# Patient Record
Sex: Male | Born: 1975 | Race: White | Hispanic: No | Marital: Married | Smoking: Never smoker
Health system: Southern US, Community
[De-identification: ages and names within clinical notes are randomized; demographics above are authoritative.]

## PROBLEM LIST (undated history)

## (undated) DIAGNOSIS — K219 Gastro-esophageal reflux disease without esophagitis: Secondary | ICD-10-CM

## (undated) DIAGNOSIS — R51 Headache: Secondary | ICD-10-CM

## (undated) DIAGNOSIS — M1990 Unspecified osteoarthritis, unspecified site: Secondary | ICD-10-CM

## (undated) HISTORY — PX: BACK SURGERY: SHX140

---

## 2007-11-16 ENCOUNTER — Encounter: Admission: RE | Admit: 2007-11-16 | Discharge: 2007-11-16 | Payer: Self-pay | Admitting: Orthopedic Surgery

## 2008-11-10 ENCOUNTER — Encounter: Admission: RE | Admit: 2008-11-10 | Discharge: 2008-11-10 | Payer: Self-pay | Admitting: Neurosurgery

## 2008-12-10 ENCOUNTER — Ambulatory Visit (HOSPITAL_COMMUNITY): Admission: RE | Admit: 2008-12-10 | Discharge: 2008-12-10 | Payer: Self-pay | Admitting: Neurosurgery

## 2009-03-13 ENCOUNTER — Encounter: Admission: RE | Admit: 2009-03-13 | Discharge: 2009-03-13 | Payer: Self-pay | Admitting: Neurosurgery

## 2010-03-25 ENCOUNTER — Encounter: Admission: RE | Admit: 2010-03-25 | Discharge: 2010-03-25 | Payer: Self-pay | Admitting: Neurosurgery

## 2010-04-28 ENCOUNTER — Inpatient Hospital Stay (HOSPITAL_COMMUNITY)
Admission: RE | Admit: 2010-04-28 | Discharge: 2010-04-30 | Payer: Self-pay | Source: Home / Self Care | Attending: Neurosurgery | Admitting: Neurosurgery

## 2010-06-05 ENCOUNTER — Encounter
Admission: RE | Admit: 2010-06-05 | Discharge: 2010-06-05 | Payer: Self-pay | Source: Home / Self Care | Attending: Neurosurgery | Admitting: Neurosurgery

## 2010-08-05 LAB — DIFFERENTIAL
Basophils Absolute: 0 10*3/uL (ref 0.0–0.1)
Basophils Relative: 0 % (ref 0–1)
Eosinophils Absolute: 0.3 10*3/uL (ref 0.0–0.7)
Eosinophils Relative: 4 % (ref 0–5)
Lymphocytes Relative: 24 % (ref 12–46)
Lymphs Abs: 1.9 10*3/uL (ref 0.7–4.0)
Monocytes Absolute: 0.4 10*3/uL (ref 0.1–1.0)
Monocytes Relative: 5 % (ref 3–12)
Neutro Abs: 5.2 10*3/uL (ref 1.7–7.7)
Neutrophils Relative %: 67 % (ref 43–77)

## 2010-08-05 LAB — CBC
HCT: 45.7 % (ref 39.0–52.0)
Hemoglobin: 15.8 g/dL (ref 13.0–17.0)
MCH: 30.7 pg (ref 26.0–34.0)
MCHC: 34.6 g/dL (ref 30.0–36.0)
MCV: 88.9 fL (ref 78.0–100.0)
Platelets: 194 10*3/uL (ref 150–400)
RBC: 5.14 MIL/uL (ref 4.22–5.81)
RDW: 12.4 % (ref 11.5–15.5)
WBC: 7.8 10*3/uL (ref 4.0–10.5)

## 2010-08-05 LAB — SURGICAL PCR SCREEN
MRSA, PCR: NEGATIVE
Staphylococcus aureus: POSITIVE — AB

## 2010-08-05 LAB — TYPE AND SCREEN
ABO/RH(D): O POS
Antibody Screen: NEGATIVE

## 2010-08-31 LAB — CBC
HCT: 44.3 % (ref 39.0–52.0)
Hemoglobin: 15.7 g/dL (ref 13.0–17.0)
MCHC: 35.4 g/dL (ref 30.0–36.0)
MCV: 87.8 fL (ref 78.0–100.0)
Platelets: 192 10*3/uL (ref 150–400)
RBC: 5.04 MIL/uL (ref 4.22–5.81)
RDW: 12.5 % (ref 11.5–15.5)
WBC: 7.9 10*3/uL (ref 4.0–10.5)

## 2010-08-31 LAB — DIFFERENTIAL
Basophils Absolute: 0 10*3/uL (ref 0.0–0.1)
Basophils Relative: 0 % (ref 0–1)
Eosinophils Absolute: 0.2 10*3/uL (ref 0.0–0.7)
Eosinophils Relative: 3 % (ref 0–5)
Lymphocytes Relative: 23 % (ref 12–46)
Lymphs Abs: 1.8 10*3/uL (ref 0.7–4.0)
Monocytes Absolute: 0.3 10*3/uL (ref 0.1–1.0)
Monocytes Relative: 4 % (ref 3–12)
Neutro Abs: 5.5 10*3/uL (ref 1.7–7.7)
Neutrophils Relative %: 70 % (ref 43–77)

## 2010-08-31 LAB — ABO/RH: ABO/RH(D): O POS

## 2010-08-31 LAB — TYPE AND SCREEN
ABO/RH(D): O POS
Antibody Screen: NEGATIVE

## 2010-10-07 NOTE — Op Note (Signed)
NAME:  Samuel [name], Samuel [name]                ACCOUNT NO.:  [id number]   MEDICAL RECORD NO.:  [id number]          PATIENT TYPE:  OIB   LOCATION:  3535                         FACILITY:  [facility]   PHYSICIAN:  Kathaleen Maser. Pool, M.D.    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  Left L4-5  herniated nucleus pulposus with radiculopathy.   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  Left L4-5  herniated nucleus pulposus with radiculopathy.   PROCEDURE NAME:  Left L3-4 decompressive laminotomy and foraminotomy.  Left L4-5 laminotomy and microdiskectomy.   SURGEON:  Kathaleen Maser. Pool, MD   ASSISTANT:  Reinaldo Meeker, MD   ANESTHESIA:  General endotracheal.   INDICATIONS:  Mr. Lea is a 35 year old male with history of back pain  with left lower extremity symptoms consistent with mixed lumbar  radiculopathy.  Workup demonstrates evidence of left-sided L3-4 stenosis  with compression of the left-sided L4 nerve root and left-sided L4-5  paracentral disk herniation with stenosis and compression of the L5  nerve root.  The patient presents now for two-level decompression in  hopes improving his symptoms.   OPERATIVE NOTE:  The patient was brought to the operating room, and  placed on operating table in supine position.  After adequate level of  anesthesia was achieved, the patient was positioned prone onto Wilson  frame and appropriately padded.  The lumbar regions were prepped and  sterilely.  A 10-blade was used to make a linear skin incision overlying  the L3, L4, L5 levels.  This was carried down sharply in the midline.  Subperiosteal dissection was then performed on the right side exposing  the lamina and facet joints at L3, L4, and L5 on the left side.  Deep  self-retaining retractor was placed.  Intraoperative x-rays taken  and  the level was confirmed.  Laminotomy was then performed using high-speed  drill and Kerrison rongeurs to remove the inferior aspect of the lamina  of L3, medial aspect of L3-4 facet joint and the superior rim of the L4  lamina.  Ligament flavum was then elevated and resected in a piecemeal  fashion using Kerrison rongeurs.  Underlying thecal sac and exiting L4  nerve root were identified.  A laminotomy at L4-5 was also performed in  a similar fashion.  Epidural venous plexus coagulated and cut.  Microscope was then brought into the field using microdissection.  Starting first at L3-4, L4 nerve root and thecal sac were mobilized.  The disk space itself was recently flat without evidence of disk  herniation or annular defect.  The wound was then irrigated with  antibiotic solution.  Attention was then placed to the L4-5 level.  Thecal sac and L5 nerve root were gently mobilized and tracked towards  the midline.  Disk herniation was readily apparent.  This was then  incised with 15-blade in a rectangular fashion.  A wide disk space clean-  out was then  achieved using pituitary rongeurs, upbitting pituitary  rongeurs, and Epstein curettes.  All elements of the disk herniation  completely resected.  All loose or obviously degenerative disk material  was removed from the interspace.  At this point, a very thorough  decompression had been achieved.  There was no injury to thecal sac or  nerve roots.  The wound was then irrigated with antibiotic solution.  Gelfoam was placed topically for hemostasis, found to be good.  Wound  was then  inspected for hemostasis and found to be good and closed in typical  fashion.  Steri-Strips and sterile dressings were applied.  There were  no apparent complications.  The patient tolerated the procedure well,  and he returns to the recovery room postoperatively.           ______________________________  Kathaleen Maser Pool, M.D.     HAP/MEDQ  D:   12/10/2008  T:  12/11/2008  Job:  [id number]

## 2010-10-15 ENCOUNTER — Other Ambulatory Visit: Payer: Self-pay | Admitting: Neurosurgery

## 2010-10-15 DIAGNOSIS — M545 Low back pain, unspecified: Secondary | ICD-10-CM

## 2010-10-24 ENCOUNTER — Ambulatory Visit
Admission: RE | Admit: 2010-10-24 | Discharge: 2010-10-24 | Disposition: A | Discharge status: Home / Self Care | Payer: Federal, State, Local not specified - PPO | Source: Ambulatory Visit | Attending: Neurosurgery | Admitting: Neurosurgery

## 2010-10-24 DIAGNOSIS — M545 Low back pain, unspecified: Secondary | ICD-10-CM

## 2013-03-16 ENCOUNTER — Other Ambulatory Visit: Payer: Self-pay | Admitting: Neurosurgery

## 2013-03-16 DIAGNOSIS — M549 Dorsalgia, unspecified: Secondary | ICD-10-CM

## 2013-03-26 ENCOUNTER — Ambulatory Visit
Admission: RE | Admit: 2013-03-26 | Discharge: 2013-03-26 | Disposition: A | Discharge status: Home / Self Care | Payer: Federal, State, Local not specified - PPO | Source: Ambulatory Visit | Attending: Neurosurgery | Admitting: Neurosurgery

## 2013-03-26 DIAGNOSIS — M549 Dorsalgia, unspecified: Secondary | ICD-10-CM

## 2013-03-26 MED ORDER — GADOBENATE DIMEGLUMINE 529 MG/ML IV SOLN
20.0000 mL | Freq: Once | INTRAVENOUS | Status: AC | PRN
  Administered 2013-03-26: 20 mL via INTRAVENOUS

## 2013-05-10 ENCOUNTER — Other Ambulatory Visit: Payer: Self-pay | Admitting: Neurosurgery

## 2013-05-29 ENCOUNTER — Encounter (HOSPITAL_COMMUNITY): Payer: Self-pay

## 2013-06-01 NOTE — Pre-Procedure Instructions (Signed)
BAYLIN GAMBLIN  06/01/2013   Your procedure is scheduled on: Monday, Jan. 19th   Report to [facility]  2 * 3 at  5:30 AM.  Call this number if you have problems the morning of surgery: [phone]   Remember:   Do not eat food or drink liquids after midnight Sunday.   Take these medicines the morning of surgery with A SIP OF WATER: Oxycodone   Do not wear jewelry.  Do not wear lotions, powders, or colognes. You may NOT wear deodorant.   Men may shave face and neck.   Do not bring valuables to the hospital.  [facility] is not responsible for any belongings or valuables.               Contacts, dentures or bridgework may not be worn into surgery.  Leave suitcase in the car. After surgery it may be brought to your room.  For patients admitted to the hospital, discharge time is determined by your treatment team.             Name and phone number of your driver:  Kanye Depree    Wife    [phone]   Special Instructions: Shower using CHG 2 nights before surgery and the night before surgery.  If you shower the day of surgery use CHG.  Use special wash - you have one bottle of CHG for all showers.  You should use approximately 1/3 of the bottle for each shower.   Please read over the following fact sheets that you were given: Pain Booklet, Blood Transfusion Information, MRSA Information and Surgical Site Infection Prevention

## 2013-06-02 ENCOUNTER — Encounter (HOSPITAL_COMMUNITY): Payer: Self-pay

## 2013-06-02 ENCOUNTER — Encounter (HOSPITAL_COMMUNITY)
Admission: RE | Admit: 2013-06-02 | Discharge: 2013-06-02 | Disposition: A | Discharge status: Home / Self Care | Payer: Federal, State, Local not specified - PPO | Source: Ambulatory Visit | Attending: Neurosurgery | Admitting: Neurosurgery

## 2013-06-02 DIAGNOSIS — Z01812 Encounter for preprocedural laboratory examination: Secondary | ICD-10-CM | POA: Insufficient documentation

## 2013-06-02 DIAGNOSIS — Z01810 Encounter for preprocedural cardiovascular examination: Secondary | ICD-10-CM | POA: Insufficient documentation

## 2013-06-02 DIAGNOSIS — Z01818 Encounter for other preprocedural examination: Secondary | ICD-10-CM | POA: Insufficient documentation

## 2013-06-02 HISTORY — DX: Unspecified osteoarthritis, unspecified site: M19.90

## 2013-06-02 HISTORY — DX: Headache: R51

## 2013-06-02 HISTORY — DX: Gastro-esophageal reflux disease without esophagitis: K21.9

## 2013-06-02 LAB — CBC
HCT: 42.5 % (ref 39.0–52.0)
Hemoglobin: 14.8 g/dL (ref 13.0–17.0)
MCH: 31.2 pg (ref 26.0–34.0)
MCHC: 34.8 g/dL (ref 30.0–36.0)
MCV: 89.5 fL (ref 78.0–100.0)
Platelets: 178 10*3/uL (ref 150–400)
RBC: 4.75 MIL/uL (ref 4.22–5.81)
RDW: 12.6 % (ref 11.5–15.5)
WBC: 7.9 10*3/uL (ref 4.0–10.5)

## 2013-06-02 LAB — SURGICAL PCR SCREEN
MRSA, PCR: NEGATIVE
Staphylococcus aureus: POSITIVE — AB

## 2013-06-02 LAB — COMPREHENSIVE METABOLIC PANEL
ALT: 47 U/L (ref 0–53)
AST: 36 U/L (ref 0–37)
Albumin: 3.5 g/dL (ref 3.5–5.2)
Alkaline Phosphatase: 78 U/L (ref 39–117)
BUN: 6 mg/dL (ref 6–23)
CO2: 22 mEq/L (ref 19–32)
Calcium: 8.8 mg/dL (ref 8.4–10.5)
Chloride: 102 mEq/L (ref 96–112)
Creatinine, Ser: 0.9 mg/dL (ref 0.50–1.35)
GFR calc Af Amer: >90 mL/min (ref >90)
GFR calc non Af Amer: >90 mL/min (ref >90)
Glucose, Bld: 214 mg/dL — ABNORMAL HIGH (ref 70–99)
Potassium: 4.5 mEq/L (ref 3.7–5.3)
Sodium: 137 mEq/L (ref 137–147)
Total Bilirubin: 0.4 mg/dL (ref 0.3–1.2)
Total Protein: 7.3 g/dL (ref 6.0–8.3)

## 2013-06-02 LAB — TYPE AND SCREEN
ABO/RH(D): O POS
Antibody Screen: NEGATIVE

## 2013-06-02 NOTE — Progress Notes (Signed)
Patient has no PCP.  Went and had ekg about 12 yr ago because his mother insisted after his father had heart attack.  Hasn't seen anyone since, but does go to [place] on [address].   DA

## 2013-06-11 MED ORDER — DEXTROSE 5 % IV SOLN
3.0000 g | INTRAVENOUS | Status: AC
  Administered 2013-06-12 (×2): 3 g via INTRAVENOUS
  Filled 2013-06-11: qty 3000

## 2013-06-12 ENCOUNTER — Inpatient Hospital Stay (HOSPITAL_COMMUNITY)
Admission: RE | Admit: 2013-06-12 | Discharge: 2013-06-15 | DRG: 460 | Disposition: A | Discharge status: Home / Self Care | Payer: Federal, State, Local not specified - PPO | Source: Ambulatory Visit | Attending: Neurosurgery | Admitting: Neurosurgery

## 2013-06-12 ENCOUNTER — Inpatient Hospital Stay (HOSPITAL_COMMUNITY): Payer: Federal, State, Local not specified - PPO | Admitting: Certified Registered Nurse Anesthetist

## 2013-06-12 ENCOUNTER — Inpatient Hospital Stay (HOSPITAL_COMMUNITY): Payer: Federal, State, Local not specified - PPO

## 2013-06-12 ENCOUNTER — Encounter (HOSPITAL_COMMUNITY)
Admission: RE | Disposition: A | Discharge status: Home / Self Care | Payer: Medicare Other | Source: Ambulatory Visit | Attending: Neurosurgery

## 2013-06-12 ENCOUNTER — Encounter (HOSPITAL_COMMUNITY): Payer: Federal, State, Local not specified - PPO | Admitting: Certified Registered Nurse Anesthetist

## 2013-06-12 ENCOUNTER — Encounter (HOSPITAL_COMMUNITY): Payer: Self-pay | Admitting: *Deleted

## 2013-06-12 DIAGNOSIS — M5136 Other intervertebral disc degeneration, lumbar region: Secondary | ICD-10-CM | POA: Diagnosis present

## 2013-06-12 DIAGNOSIS — M545 Low back pain, unspecified: Secondary | ICD-10-CM | POA: Diagnosis present

## 2013-06-12 DIAGNOSIS — K219 Gastro-esophageal reflux disease without esophagitis: Secondary | ICD-10-CM | POA: Diagnosis present

## 2013-06-12 DIAGNOSIS — Z79899 Other long term (current) drug therapy: Secondary | ICD-10-CM | POA: Diagnosis not present

## 2013-06-12 DIAGNOSIS — M51369 Other intervertebral disc degeneration, lumbar region without mention of lumbar back pain or lower extremity pain: Secondary | ICD-10-CM | POA: Diagnosis present

## 2013-06-12 DIAGNOSIS — G8929 Other chronic pain: Secondary | ICD-10-CM | POA: Diagnosis present

## 2013-06-12 DIAGNOSIS — M51379 Other intervertebral disc degeneration, lumbosacral region without mention of lumbar back pain or lower extremity pain: Principal | ICD-10-CM | POA: Diagnosis present

## 2013-06-12 DIAGNOSIS — M5137 Other intervertebral disc degeneration, lumbosacral region: Principal | ICD-10-CM | POA: Diagnosis present

## 2013-06-12 DIAGNOSIS — R509 Fever, unspecified: Secondary | ICD-10-CM | POA: Diagnosis not present

## 2013-06-12 SURGERY — POSTERIOR LUMBAR FUSION 2 LEVEL
Anesthesia: General | Site: Back

## 2013-06-12 MED ORDER — ALUM & MAG HYDROXIDE-SIMETH 200-200-20 MG/5ML PO SUSP: 30.0000 mL | Freq: Four times a day (QID) | ORAL | Status: DC | PRN

## 2013-06-12 MED ORDER — PHENOL 1.4 % MT LIQD: 1.0000 | OROMUCOSAL | Status: DC | PRN

## 2013-06-12 MED ORDER — LIDOCAINE HCL (CARDIAC) 20 MG/ML IV SOLN
INTRAVENOUS | Status: DC | PRN
  Administered 2013-06-12: 40 mg via INTRAVENOUS

## 2013-06-12 MED ORDER — MIDAZOLAM HCL 5 MG/5ML IJ SOLN
INTRAMUSCULAR | Status: DC | PRN
  Administered 2013-06-12: 2 mg via INTRAVENOUS

## 2013-06-12 MED ORDER — ALBUMIN HUMAN 5 % IV SOLN
INTRAVENOUS | Status: DC | PRN
  Administered 2013-06-12: 11:00:00 via INTRAVENOUS

## 2013-06-12 MED ORDER — ACETAMINOPHEN 650 MG RE SUPP: 650.0000 mg | RECTAL | Status: DC | PRN

## 2013-06-12 MED ORDER — FENTANYL CITRATE 0.05 MG/ML IJ SOLN
INTRAMUSCULAR | Status: DC | PRN
  Administered 2013-06-12 (×2): 50 ug via INTRAVENOUS
  Administered 2013-06-12: 250 ug via INTRAVENOUS
  Administered 2013-06-12 (×3): 50 ug via INTRAVENOUS

## 2013-06-12 MED ORDER — ROCURONIUM BROMIDE 100 MG/10ML IV SOLN
INTRAVENOUS | Status: DC | PRN
  Administered 2013-06-12: 50 mg via INTRAVENOUS

## 2013-06-12 MED ORDER — THROMBIN 5000 UNITS EX SOLR
CUTANEOUS | Status: DC | PRN
  Administered 2013-06-12 (×2): 5000 [IU] via TOPICAL

## 2013-06-12 MED ORDER — THROMBIN 20000 UNITS EX SOLR
CUTANEOUS | Status: DC | PRN
  Administered 2013-06-12: 09:00:00 via TOPICAL

## 2013-06-12 MED ORDER — OXYCODONE-ACETAMINOPHEN 5-325 MG PO TABS
1.0000 | ORAL_TABLET | ORAL | Status: DC | PRN
  Administered 2013-06-12 – 2013-06-15 (×10): 2 via ORAL
  Filled 2013-06-12 (×2): qty 2
  Filled 2013-06-12: qty 1
  Filled 2013-06-12 (×9): qty 2

## 2013-06-12 MED ORDER — MEPERIDINE HCL 25 MG/ML IJ SOLN: 6.2500 mg | INTRAMUSCULAR | Status: DC | PRN

## 2013-06-12 MED ORDER — HYDROMORPHONE HCL PF 1 MG/ML IJ SOLN
INTRAMUSCULAR | Status: DC | PRN
  Administered 2013-06-12 (×2): 0.5 mg via INTRAVENOUS

## 2013-06-12 MED ORDER — MIDAZOLAM HCL 2 MG/2ML IJ SOLN
INTRAMUSCULAR | Status: AC
  Filled 2013-06-12: qty 2

## 2013-06-12 MED ORDER — KETOROLAC TROMETHAMINE 30 MG/ML IJ SOLN
30.0000 mg | Freq: Once | INTRAMUSCULAR | Status: AC
  Administered 2013-06-12: 30 mg via INTRAVENOUS

## 2013-06-12 MED ORDER — NALOXONE HCL 0.4 MG/ML IJ SOLN: 0.4000 mg | INTRAMUSCULAR | Status: DC | PRN

## 2013-06-12 MED ORDER — SODIUM CHLORIDE 0.9 % IR SOLN
Status: DC | PRN
  Administered 2013-06-12: 09:00:00

## 2013-06-12 MED ORDER — GLYCOPYRROLATE 0.2 MG/ML IJ SOLN
INTRAMUSCULAR | Status: DC | PRN
  Administered 2013-06-12: 0.6 mg via INTRAVENOUS

## 2013-06-12 MED ORDER — ONDANSETRON HCL 4 MG/2ML IJ SOLN: 4.0000 mg | Freq: Four times a day (QID) | INTRAMUSCULAR | Status: DC | PRN

## 2013-06-12 MED ORDER — DIAZEPAM 5 MG PO TABS
5.0000 mg | ORAL_TABLET | Freq: Four times a day (QID) | ORAL | Status: DC | PRN
Start: 2013-06-12 — End: 2013-06-15
  Administered 2013-06-12 – 2013-06-15 (×10): 5 mg via ORAL
  Filled 2013-06-12 (×9): qty 1

## 2013-06-12 MED ORDER — OXYCODONE-ACETAMINOPHEN 10-325 MG PO TABS: 1.0000 | ORAL_TABLET | Freq: Four times a day (QID) | ORAL | Status: DC | PRN

## 2013-06-12 MED ORDER — HEMOSTATIC AGENTS (NO CHARGE) OPTIME
TOPICAL | Status: DC | PRN
  Administered 2013-06-12: 1 via TOPICAL

## 2013-06-12 MED ORDER — KETOROLAC TROMETHAMINE 30 MG/ML IJ SOLN
INTRAMUSCULAR | Status: AC
  Administered 2013-06-12: 15:00:00
  Filled 2013-06-12: qty 1

## 2013-06-12 MED ORDER — DIAZEPAM 5 MG PO TABS
ORAL_TABLET | ORAL | Status: AC
  Filled 2013-06-12: qty 1

## 2013-06-12 MED ORDER — ONDANSETRON HCL 4 MG/2ML IJ SOLN: 4.0000 mg | INTRAMUSCULAR | Status: DC | PRN

## 2013-06-12 MED ORDER — OXYCODONE-ACETAMINOPHEN 5-325 MG PO TABS
1.0000 | ORAL_TABLET | Freq: Four times a day (QID) | ORAL | Status: DC | PRN
  Filled 2013-06-12: qty 1

## 2013-06-12 MED ORDER — MORPHINE SULFATE 15 MG PO TABS: 30.0000 mg | ORAL_TABLET | Freq: Two times a day (BID) | ORAL | Status: DC

## 2013-06-12 MED ORDER — BUPIVACAINE-EPINEPHRINE PF 0.5-1:200000 % IJ SOLN
INTRAMUSCULAR | Status: DC | PRN
  Administered 2013-06-12: 10 mL via PERINEURAL

## 2013-06-12 MED ORDER — MORPHINE SULFATE (PF) 1 MG/ML IV SOLN
INTRAVENOUS | Status: AC
  Filled 2013-06-12: qty 25

## 2013-06-12 MED ORDER — PROMETHAZINE HCL 25 MG/ML IJ SOLN: 6.2500 mg | INTRAMUSCULAR | Status: DC | PRN

## 2013-06-12 MED ORDER — BUPIVACAINE LIPOSOME 1.3 % IJ SUSP
20.0000 mL | INTRAMUSCULAR | Status: AC
  Filled 2013-06-12: qty 20

## 2013-06-12 MED ORDER — LACTATED RINGERS IV SOLN
INTRAVENOUS | Status: DC
  Administered 2013-06-12 – 2013-06-13 (×2): via INTRAVENOUS

## 2013-06-12 MED ORDER — OXYCODONE HCL 5 MG PO TABS
ORAL_TABLET | ORAL | Status: AC
  Filled 2013-06-12: qty 1

## 2013-06-12 MED ORDER — VECURONIUM BROMIDE 10 MG IV SOLR
INTRAVENOUS | Status: DC | PRN
  Administered 2013-06-12 (×6): 2 mg via INTRAVENOUS
  Administered 2013-06-12: 3 mg via INTRAVENOUS
  Administered 2013-06-12: 2 mg via INTRAVENOUS

## 2013-06-12 MED ORDER — MIDAZOLAM HCL 2 MG/2ML IJ SOLN
0.5000 mg | Freq: Once | INTRAMUSCULAR | Status: AC | PRN
  Administered 2013-06-12: 2 mg via INTRAVENOUS

## 2013-06-12 MED ORDER — SODIUM CHLORIDE 0.9 % IJ SOLN: 9.0000 mL | INTRAMUSCULAR | Status: DC | PRN

## 2013-06-12 MED ORDER — BACITRACIN ZINC 500 UNIT/GM EX OINT
TOPICAL_OINTMENT | CUTANEOUS | Status: DC | PRN
  Administered 2013-06-12: 1 via TOPICAL

## 2013-06-12 MED ORDER — 0.9 % SODIUM CHLORIDE (POUR BTL) OPTIME
TOPICAL | Status: DC | PRN
  Administered 2013-06-12 (×2): 1000 mL

## 2013-06-12 MED ORDER — DIPHENHYDRAMINE HCL 50 MG/ML IJ SOLN: 12.5000 mg | Freq: Four times a day (QID) | INTRAMUSCULAR | Status: DC | PRN

## 2013-06-12 MED ORDER — GABAPENTIN 300 MG PO CAPS
600.0000 mg | ORAL_CAPSULE | Freq: Every day | ORAL | Status: DC
  Administered 2013-06-12 – 2013-06-14 (×3): 600 mg via ORAL
  Filled 2013-06-12 (×5): qty 2

## 2013-06-12 MED ORDER — HYDROCODONE-ACETAMINOPHEN 5-325 MG PO TABS
1.0000 | ORAL_TABLET | ORAL | Status: DC | PRN
  Administered 2013-06-14: 2 via ORAL
  Filled 2013-06-12: qty 2

## 2013-06-12 MED ORDER — OXYCODONE HCL 5 MG PO TABS
5.0000 mg | ORAL_TABLET | Freq: Four times a day (QID) | ORAL | Status: DC | PRN
  Administered 2013-06-14: 5 mg via ORAL
  Filled 2013-06-12 (×3): qty 1

## 2013-06-12 MED ORDER — LACTATED RINGERS IV SOLN
INTRAVENOUS | Status: DC | PRN
  Administered 2013-06-12 (×4): via INTRAVENOUS

## 2013-06-12 MED ORDER — CEFAZOLIN SODIUM-DEXTROSE 2-3 GM-% IV SOLR
INTRAVENOUS | Status: AC
  Filled 2013-06-12: qty 50

## 2013-06-12 MED ORDER — PROPOFOL 10 MG/ML IV BOLUS
INTRAVENOUS | Status: DC | PRN
  Administered 2013-06-12: 200 mg via INTRAVENOUS

## 2013-06-12 MED ORDER — ONDANSETRON HCL 4 MG/2ML IJ SOLN
INTRAMUSCULAR | Status: DC | PRN
  Administered 2013-06-12: 4 mg via INTRAVENOUS

## 2013-06-12 MED ORDER — DOCUSATE SODIUM 100 MG PO CAPS
100.0000 mg | ORAL_CAPSULE | Freq: Two times a day (BID) | ORAL | Status: DC
  Administered 2013-06-12 – 2013-06-15 (×6): 100 mg via ORAL
  Filled 2013-06-12 (×5): qty 1

## 2013-06-12 MED ORDER — MORPHINE SULFATE (PF) 1 MG/ML IV SOLN
INTRAVENOUS | Status: DC
  Administered 2013-06-12: 16:00:00 via INTRAVENOUS
  Administered 2013-06-12: 7.5 mg via INTRAVENOUS
  Administered 2013-06-12: 14:00:00 via INTRAVENOUS
  Administered 2013-06-13: 25.5 mg via INTRAVENOUS
  Administered 2013-06-13: 20.88 mg via INTRAVENOUS
  Administered 2013-06-13: 6 mg via INTRAVENOUS
  Administered 2013-06-13: 9.9 mg via INTRAVENOUS
  Administered 2013-06-13: 16:00:00 via INTRAVENOUS
  Administered 2013-06-13: 20.68 mg via INTRAVENOUS
  Administered 2013-06-13: 25.47 mg via INTRAVENOUS
  Administered 2013-06-13: 20 mg via INTRAVENOUS
  Administered 2013-06-13 (×2): via INTRAVENOUS
  Administered 2013-06-14: 13.03 mg via INTRAVENOUS
  Administered 2013-06-14: 02:00:00 via INTRAVENOUS
  Administered 2013-06-14: 20.99 mg via INTRAVENOUS
  Filled 2013-06-12 (×8): qty 25

## 2013-06-12 MED ORDER — CEFAZOLIN SODIUM 1-5 GM-% IV SOLN
INTRAVENOUS | Status: AC
  Filled 2013-06-12: qty 50

## 2013-06-12 MED ORDER — CEFAZOLIN SODIUM-DEXTROSE 2-3 GM-% IV SOLR
2.0000 g | Freq: Three times a day (TID) | INTRAVENOUS | Status: AC
  Administered 2013-06-12 – 2013-06-13 (×2): 2 g via INTRAVENOUS
  Filled 2013-06-12 (×2): qty 50

## 2013-06-12 MED ORDER — HYDROMORPHONE HCL PF 1 MG/ML IJ SOLN
0.2500 mg | INTRAMUSCULAR | Status: DC | PRN
  Administered 2013-06-12 (×4): 0.5 mg via INTRAVENOUS

## 2013-06-12 MED ORDER — OXYCODONE HCL 5 MG PO TABS
5.0000 mg | ORAL_TABLET | Freq: Once | ORAL | Status: AC | PRN
  Administered 2013-06-12: 5 mg via ORAL

## 2013-06-12 MED ORDER — BUPIVACAINE LIPOSOME 1.3 % IJ SUSP
INTRAMUSCULAR | Status: DC | PRN
  Administered 2013-06-12: 20 mL

## 2013-06-12 MED ORDER — MENTHOL 3 MG MT LOZG: 1.0000 | LOZENGE | OROMUCOSAL | Status: DC | PRN

## 2013-06-12 MED ORDER — MORPHINE SULFATE 15 MG PO TABS: 60.0000 mg | ORAL_TABLET | Freq: Every day | ORAL | Status: DC

## 2013-06-12 MED ORDER — HYDROMORPHONE HCL PF 1 MG/ML IJ SOLN
INTRAMUSCULAR | Status: AC
  Filled 2013-06-12: qty 1

## 2013-06-12 MED ORDER — ARTIFICIAL TEARS OP OINT
TOPICAL_OINTMENT | OPHTHALMIC | Status: DC | PRN
  Administered 2013-06-12: 1 via OPHTHALMIC

## 2013-06-12 MED ORDER — NEOSTIGMINE METHYLSULFATE 1 MG/ML IJ SOLN
INTRAMUSCULAR | Status: DC | PRN
  Administered 2013-06-12: 4 mg via INTRAVENOUS

## 2013-06-12 MED ORDER — OXYCODONE HCL 5 MG/5ML PO SOLN: 5.0000 mg | Freq: Once | ORAL | Status: AC | PRN

## 2013-06-12 MED ORDER — MORPHINE SULFATE 2 MG/ML IJ SOLN
1.0000 mg | INTRAMUSCULAR | Status: DC | PRN
  Administered 2013-06-12 – 2013-06-13 (×7): 4 mg via INTRAVENOUS
  Administered 2013-06-14 (×2): 2 mg via INTRAVENOUS
  Administered 2013-06-14: 4 mg via INTRAVENOUS
  Administered 2013-06-14 (×3): 2 mg via INTRAVENOUS
  Filled 2013-06-12 (×2): qty 1
  Filled 2013-06-12 (×3): qty 2
  Filled 2013-06-12 (×2): qty 1
  Filled 2013-06-12 (×3): qty 2
  Filled 2013-06-12: qty 1
  Filled 2013-06-12 (×2): qty 2

## 2013-06-12 MED ORDER — DIPHENHYDRAMINE HCL 12.5 MG/5ML PO ELIX: 12.5000 mg | ORAL_SOLUTION | Freq: Four times a day (QID) | ORAL | Status: DC | PRN

## 2013-06-12 MED ORDER — MORPHINE SULFATE 15 MG PO TABS
30.0000 mg | ORAL_TABLET | Freq: Every day | ORAL | Status: DC
  Administered 2013-06-12: 30 mg via ORAL
  Filled 2013-06-12: qty 2

## 2013-06-12 MED ORDER — ACETAMINOPHEN 325 MG PO TABS: 650.0000 mg | ORAL_TABLET | ORAL | Status: DC | PRN

## 2013-06-12 SURGICAL SUPPLY — 77 items
APL SKNCLS STERI-STRIP NONHPOA (GAUZE/BANDAGES/DRESSINGS) ×1
BAG DECANTER FOR FLEXI CONT (MISCELLANEOUS) ×3 IMPLANT
BENZOIN TINCTURE PRP APPL 2/3 (GAUZE/BANDAGES/DRESSINGS) ×3 IMPLANT
BLADE SURG ROTATE 9660 (MISCELLANEOUS) IMPLANT
BRUSH SCRUB EZ PLAIN DRY (MISCELLANEOUS) ×3 IMPLANT
BUR ACORN 6.0 (BURR) ×2 IMPLANT
BUR ACORN 6.0MM (BURR) ×1
BUR MATCHSTICK NEURO 3.0 LAGG (BURR) ×3 IMPLANT
CANISTER SUCT 3000ML (MISCELLANEOUS) ×3 IMPLANT
CAP LCK SPNE (Orthopedic Implant) ×8 IMPLANT
CAP LOCK SPINE RADIUS (Orthopedic Implant) IMPLANT
CAP LOCKING (Orthopedic Implant) ×24 IMPLANT
CLOSURE WOUND 1/2 X4 (GAUZE/BANDAGES/DRESSINGS) ×1
CONT SPEC 4OZ CLIKSEAL STRL BL (MISCELLANEOUS) ×3 IMPLANT
COVER BACK TABLE 24X17X13 BIG (DRAPES) IMPLANT
COVER TABLE BACK 60X90 (DRAPES) ×3 IMPLANT
DRAPE C-ARM 42X72 X-RAY (DRAPES) ×6 IMPLANT
DRAPE LAPAROTOMY 100X72X124 (DRAPES) ×3 IMPLANT
DRAPE POUCH INSTRU U-SHP 10X18 (DRAPES) ×3 IMPLANT
DRAPE PROXIMA HALF (DRAPES) ×3 IMPLANT
DRAPE SURG 17X23 STRL (DRAPES) ×12 IMPLANT
ELECT BLADE 4.0 EZ CLEAN MEGAD (MISCELLANEOUS) ×3
ELECT REM PT RETURN 9FT ADLT (ELECTROSURGICAL) ×3
ELECTRODE BLDE 4.0 EZ CLN MEGD (MISCELLANEOUS) ×1 IMPLANT
ELECTRODE REM PT RTRN 9FT ADLT (ELECTROSURGICAL) ×1 IMPLANT
GAUZE SPONGE 4X4 16PLY XRAY LF (GAUZE/BANDAGES/DRESSINGS) ×5 IMPLANT
GLOVE BIO SURGEON STRL SZ8.5 (GLOVE) ×6 IMPLANT
GLOVE BIOGEL PI IND STRL 7.5 (GLOVE) IMPLANT
GLOVE BIOGEL PI INDICATOR 7.5 (GLOVE) ×6
GLOVE EXAM NITRILE LRG STRL (GLOVE) IMPLANT
GLOVE EXAM NITRILE MD LF STRL (GLOVE) IMPLANT
GLOVE EXAM NITRILE XL STR (GLOVE) IMPLANT
GLOVE EXAM NITRILE XS STR PU (GLOVE) IMPLANT
GLOVE SS BIOGEL STRL SZ 8 (GLOVE) ×2 IMPLANT
GLOVE SUPERSENSE BIOGEL SZ 8 (GLOVE) ×4
GLOVE SURG SS PI 7.0 STRL IVOR (GLOVE) ×6 IMPLANT
GOWN BRE IMP SLV AUR LG STRL (GOWN DISPOSABLE) IMPLANT
GOWN BRE IMP SLV AUR XL STRL (GOWN DISPOSABLE) ×2 IMPLANT
GOWN STRL REIN 2XL LVL4 (GOWN DISPOSABLE) IMPLANT
GOWN STRL REUS W/ TWL XL LVL3 (GOWN DISPOSABLE) IMPLANT
GOWN STRL REUS W/TWL XL LVL3 (GOWN DISPOSABLE) ×12
KIT BASIN OR (CUSTOM PROCEDURE TRAY) ×3 IMPLANT
KIT ROOM TURNOVER OR (KITS) ×3 IMPLANT
NDL HYPO 21X1.5 SAFETY (NEEDLE) IMPLANT
NEEDLE HYPO 21X1.5 SAFETY (NEEDLE) ×3 IMPLANT
NEEDLE HYPO 22GX1.5 SAFETY (NEEDLE) ×3 IMPLANT
NS IRRIG 1000ML POUR BTL (IV SOLUTION) ×3 IMPLANT
PACK FOAM VITOSS 10CC (Orthopedic Implant) ×4 IMPLANT
PACK LAMINECTOMY NEURO (CUSTOM PROCEDURE TRAY) ×3 IMPLANT
PAD ARMBOARD 7.5X6 YLW CONV (MISCELLANEOUS) ×9 IMPLANT
PATTIES SURGICAL .5 X.5 (GAUZE/BANDAGES/DRESSINGS) ×2 IMPLANT
PATTIES SURGICAL .5 X1 (DISPOSABLE) IMPLANT
PATTIES SURGICAL 1X1 (DISPOSABLE) ×2 IMPLANT
PUTTY 10ML ACTIFUSE ABX (Putty) ×6 IMPLANT
ROD 110MM (Rod) ×3 IMPLANT
ROD SPNL 110X5.5XNS TI RDS (Rod) IMPLANT
ROD STRAIGHT 110MM (Rod) ×2 IMPLANT
SCREW 7.75 X 40 MM (Screw) ×2 IMPLANT
SCREW 7.75X50MM (Screw) ×8 IMPLANT
SPACER CAPSTONE SPINE PK 14X26 (Spacer) ×4 IMPLANT
SPACER CAPSTONE SPINE SY 12X26 (Orthopedic Implant) ×4 IMPLANT
SPONGE GAUZE 4X4 12PLY (GAUZE/BANDAGES/DRESSINGS) ×3 IMPLANT
SPONGE LAP 4X18 X RAY DECT (DISPOSABLE) IMPLANT
SPONGE NEURO XRAY DETECT 1X3 (DISPOSABLE) IMPLANT
SPONGE SURGIFOAM ABS GEL 100 (HEMOSTASIS) ×3 IMPLANT
STRIP CLOSURE SKIN 1/2X4 (GAUZE/BANDAGES/DRESSINGS) ×2 IMPLANT
SUT VIC AB 1 CT1 18XBRD ANBCTR (SUTURE) ×2 IMPLANT
SUT VIC AB 1 CT1 8-18 (SUTURE) ×6
SUT VIC AB 2-0 CP2 18 (SUTURE) ×6 IMPLANT
SYR 20CC LL (SYRINGE) ×2 IMPLANT
SYR 20ML ECCENTRIC (SYRINGE) ×3 IMPLANT
TAPE CLOTH SURG 4X10 WHT LF (GAUZE/BANDAGES/DRESSINGS) ×2 IMPLANT
TAPE STRIPS DRAPE STRL (GAUZE/BANDAGES/DRESSINGS) ×2 IMPLANT
TOWEL OR 17X24 6PK STRL BLUE (TOWEL DISPOSABLE) ×3 IMPLANT
TOWEL OR 17X26 10 PK STRL BLUE (TOWEL DISPOSABLE) ×3 IMPLANT
TRAY FOLEY CATH 14FRSI W/METER (CATHETERS) ×3 IMPLANT
WATER STERILE IRR 1000ML POUR (IV SOLUTION) ×3 IMPLANT

## 2013-06-12 NOTE — Anesthesia Postprocedure Evaluation (Signed)
  Anesthesia Post-op Note  Patient: Samuel [name]  Procedure(s) Performed: Procedure(s) with comments: POSTERIOR LUMBAR FUSION 2 LEVEL (N/A) - L45 L5S1 laminectomies with posterior lumbar interbody fusion with interbody prosthesis posterior lateral arthrodesis and posterior segmental instrumentation with exploration of previous fusion  Patient Location: PACU  Anesthesia Type:General  Level of Consciousness: awake, alert , oriented and patient cooperative  Airway and Oxygen Therapy: Patient Spontanous Breathing and Patient connected to nasal cannula oxygen  Post-op Pain: mild  Post-op Assessment: Post-op Vital signs reviewed, Patient's Cardiovascular Status Stable, Respiratory Function Stable, Patent Airway, No signs of Nausea or vomiting and Pain level controlled on PCA  Post-op Vital Signs: Reviewed and stable  Complications: No apparent anesthesia complications

## 2013-06-12 NOTE — Transfer of Care (Signed)
Immediate Anesthesia Transfer of Care Note  Patient: MAKI SWEETSER  Procedure(s) Performed: Procedure(s) with comments: POSTERIOR LUMBAR FUSION 2 LEVEL (N/A) - L45 L5S1 laminectomies with posterior lumbar interbody fusion with interbody prosthesis posterior lateral arthrodesis and posterior segmental instrumentation with exploration of previous fusion  Patient Location: PACU  Anesthesia Type:General  Level of Consciousness: awake, alert , oriented and patient cooperative  Airway & Oxygen Therapy: Patient Spontanous Breathing and Patient connected to nasal cannula oxygen  Post-op Assessment: Report given to PACU RN, Post -op Vital signs reviewed and stable, Patient moving all extremities and Patient moving all extremities X 4  Post vital signs: Reviewed and stable  Complications: No apparent anesthesia complications

## 2013-06-12 NOTE — Anesthesia Procedure Notes (Signed)
Procedure Name: Intubation Date/Time: 06/12/2013 7:41 AM Performed by: Ned Grace Pre-anesthesia Checklist: Patient identified, Timeout performed, Emergency Drugs available, Suction available and Patient being monitored Patient Re-evaluated:Patient Re-evaluated prior to inductionOxygen Delivery Method: Circle system utilized Preoxygenation: Pre-oxygenation with 100% oxygen Intubation Type: IV induction Ventilation: Two handed mask ventilation required and Oral airway inserted - appropriate to patient size Laryngoscope Size: Mac and 4 Grade View: Grade II Tube type: Oral Tube size: 7.5 mm Number of attempts: 1 Airway Equipment and Method: Stylet and Oral airway (soft bite block) Placement Confirmation: ETT inserted through vocal cords under direct vision,  breath sounds checked- equal and bilateral and positive ETCO2 Secured at: 24 cm Tube secured with: Tape Dental Injury: Teeth and Oropharynx as per pre-operative assessment

## 2013-06-12 NOTE — Op Note (Signed)
Brief history: 38 year old white male who has undergone a previous L3-4 decompression and fusion by another physician years ago. He's had persistent back and leg pain. He has failed medical management. He has been worked up with a lumbar discogram which demonstrated concordant pain at L4-5 and L5-S1. I discussed the various treatment options with the patient including surgery. He has weighed the risks, benefits, and alternatives surgery and decided proceed with an exploration of lumbar fusion and the L4-5 and L5-S1 decompression, instrumentation, and fusion.  Preoperative diagnosis: L4-5 and L5-S1 Degenerative disc disease, spinal stenosis compressing both the L4, L5 and S1 nerve roots; lumbago; lumbar radiculopathy  Postoperative diagnosis: The same  Procedure: Bilateral L4 and L5 Laminotomy/foraminotomies to decompress the bilateral L4, L5 and S1 nerve roots(the work required to do this was in addition to the work required to do the posterior lumbar interbody fusion because of the patient's spinal stenosis, facet arthropathy. Etc. requiring a wide decompression of the nerve roots.); L4-5 and L5-S1 posterior lumbar interbody fusion with local morselized autograft bone and Actifusebone graft extender; insertion of interbody prosthesis at L4-5 and L5-S1 (globus peek interbody prosthesis); posterior segmental instrumentation from L3 to S1 with globus titanium pedicle screws and rods; posterior lateral arthrodesis at L4-5 and L5-S1 with local morselized autograft bone and Vitoss bone graft extender; exploration of lumbar fusion  Surgeon: Dr. Earle Gell  Asst.: Dr. Oneida Arenas  Anesthesia: Gen. endotracheal  Estimated blood loss: 250 cc  Drains: One medium Hemovac  Complications: None  Description of procedure: The patient was brought to the operating room by the anesthesia team. General endotracheal anesthesia was induced. The patient was turned to the prone position on the Wilson frame. The  patient's lumbosacral region was then prepared with Betadine scrub and Betadine solution. Sterile drapes were applied.  I then injected the area to be incised with Marcaine with epinephrine solution. I then used the scalpel to make a linear midline incision over the L4-5 and L5-S1 interspace, incising through the old surgical scar. I then used electrocautery to perform a bilateral subperiosteal dissection exposing the spinous process and lamina of L3-S1 and exposing the old hardware at L3-4. We then obtained intraoperative radiograph to confirm our location. We then inserted the Verstrac retractor to provide exposure. We explored the fusion by removing the caps and old rods and the old screws in attempting to independently move the screws. The arthrodesis at L3-4 appears solid.  I began the decompression by using the high speed drill to perform laminotomies at L4 and L5 bilaterally. We then used the Kerrison punches to widen the laminotomy and removed the ligamentum flavum at L4-5 and L5-S1. We used the Kerrison punches to remove the medial facets at L4-5 and L5-S1. We performed wide foraminotomies about the bilateral L4, L5 and S1 nerve roots completing the decompression.  We now turned our attention to the posterior lumbar interbody fusion. I used a scalpel to incise the intervertebral disc at L4-5 and L5-S1. I then performed a partial intervertebral discectomy at L4-5 and L5-S1 using the pituitary forceps. We prepared the vertebral endplates at Z6-1 and W9-U0 for the fusion by removing the soft tissues with the curettes. We then used the trial spacers to pick the appropriate sized interbody prosthesis. We prefilled his prosthesis with a combination of local morselized autograft bone that we obtained during the decompression as well as Actifuse bone graft extender. We inserted the prefilled prosthesis into the interspace at L4-5 and L5-S1. There was a good snug  fit of the prosthesis in the interspace. We  then filled and the remainder of the intervertebral disc space with local morselized autograft bone and Actifuse. This completed the posterior lumbar interbody arthrodesis.  We now turned attention to the instrumentation. Under fluoroscopic guidance we cannulated the bilateral L5 and S1 pedicles with the bone probe. We then removed the bone probe. We then tapped the pedicle with a 6.25 millimeter tap. We then removed the tap. We probed inside the tapped pedicle with a ball probe to rule out cortical breaches. We then inserted a 7.75 x 50 millimeter pedicle screw into the L4 and L5 pedicles bilaterally under fluoroscopic guidance. We then palpated along the medial aspect of the pedicles to rule out cortical breaches. There were none. The nerve roots were not injured. We then connected the unilateral pedicle screws from L3-S1 with a lordotic rod. We compressed the construct and secured the rod in place with the caps. We then tightened the caps appropriately. This completed the instrumentation from L3-S1.  We now turned our attention to the posterior lateral arthrodesis at L4-5 and L5-S1. We used the high-speed drill to decorticate the remainder of the facets, pars, transverse process at L4-5 and L5-S1. We then applied a combination of local morselized autograft bone and Vitoss bone graft extender over these decorticated posterior lateral structures. This completed the posterior lateral arthrodesis.  We then obtained hemostasis using bipolar electrocautery. We irrigated the wound out with bacitracin solution. We inspected the thecal sac and nerve roots and noted they were well decompressed. We then removed the retractor. We placed a medium Hemovac drain in the epidural space and tunneled out through separate stab wound. We reapproximated patient's thoracolumbar fascia with interrupted #1 Vicryl suture. We reapproximated patient's subcutaneous tissue with interrupted 2-0 Vicryl suture. The reapproximated patient's  skin with Steri-Strips and benzoin. The wound was then coated with bacitracin ointment. A sterile dressing was applied. The drapes were removed. The patient was subsequently returned to the supine position where they were extubated by the anesthesia team. He was then transported to the post anesthesia care unit in stable condition. All sponge instrument and needle counts were reportedly correct at the end of this case.

## 2013-06-12 NOTE — H&P (Signed)
Subjective: The patient is a 38 year old white male who's had a previous lumbar fusion by another physician years ago. He has had chronic back and leg pain. He has failed medical management. He was worked up with a lumbar MRI which demonstrated disc degeneration at L4-5 and L5-S1. I discussed the various treatment options with the patient including surgery. He has weighed the risks, benefits, and alternatives surgery and decided proceed with at L4-5 and L5-S1 decompression, instrumentation, and fusion.   Past Medical History  Diagnosis Date  . GERD (gastroesophageal reflux disease)   . Headache(784.0)   . Arthritis     "some in my back"    Past Surgical History  Procedure Laterality Date  . Back surgery      lower back surgery x 2    No Known Allergies  History  Substance Use Topics  . Smoking status: Never Smoker   . Smokeless tobacco: Not on file  . Alcohol Use: Yes     Comment: very rare (not since son was born)    History reviewed. No pertinent family history. Prior to Admission medications   Medication Sig Start Date End Date Taking? Authorizing Provider  gabapentin (NEURONTIN) 300 MG capsule Take 600 mg by mouth at bedtime.   Yes Historical Provider, MD  ibuprofen (ADVIL,MOTRIN) 800 MG tablet Take 800 mg by mouth every 8 (eight) hours as needed.   Yes Historical Provider, MD  morphine (MSIR) 30 MG tablet Take 30 mg by mouth 2 (two) times daily. Takes 60 mg in the morning & 30 mg in the evening   Yes Historical Provider, MD  [place] Take 2 tablets by mouth daily. Generic Brand Stool Softner   Yes Historical Provider, MD  oxyCODONE-acetaminophen (PERCOCET) 10-325 MG per tablet Take 1 tablet by mouth every 6 (six) hours as needed for pain.   Yes Historical Provider, MD     Review of Systems  Positive ROS: As above  All other systems have been reviewed and were otherwise negative with the exception of those mentioned in the HPI and as  above.  Objective: Vital signs in last 24 hours: Pulse Rate:  [78] 78 (01/19 0607) Resp:  [20] 20 (01/19 0607) BP: (144)/(68) 144/68 mmHg (01/19 0607) SpO2:  [100 %] 100 % (01/19 0607)  General Appearance: Alert, cooperative, no distress, appears stated age Head: Normocephalic, without obvious abnormality, atraumatic Eyes: PERRL, conjunctiva/corneas clear, EOM's intact, fundi benign, both eyes      Ears: Normal TM's and external ear canals, both ears Throat: Lips, mucosa, and tongue normal; teeth and gums normal Neck: Supple, symmetrical, trachea midline, no adenopathy; thyroid: No enlargement/tenderness/nodules; no carotid bruit or JVD Back: Symmetric, no curvature, ROM normal, no CVA tenderness. The patient's lumbar incision is well-healed Lungs: Clear to auscultation bilaterally, respirations unlabored Heart: Regular rate and rhythm, S1 and S2 normal, no murmur, rub or gallop Abdomen: Soft, non-tender, bowel sounds active all four quadrants, no masses, no organomegaly Extremities: Extremities normal, atraumatic, no cyanosis or edema Pulses: 2+ and symmetric all extremities Skin: Skin color, texture, turgor normal, no rashes or lesions  NEUROLOGIC:   Mental status: alert and oriented, no aphasia, good attention span, Fund of knowledge/ memory ok Motor Exam - grossly normal Sensory Exam - grossly normal Reflexes:  Coordination - grossly normal Gait - grossly normal Balance - grossly normal Cranial Nerves: I: smell Not tested  II: visual acuity  OS: Normal    OD: Normal   II: visual fields Full to  confrontation  II: pupils Equal, round, reactive to light  III,VII: ptosis None  III,IV,VI: extraocular muscles  Full ROM  V: mastication Normal  V: facial light touch sensation  Normal  V,VII: corneal reflex  Present  VII: facial muscle function - upper  Normal  VII: facial muscle function - lower Normal  VIII: hearing Not tested  IX: soft palate elevation  Normal  IX,X: gag  reflex Present  XI: trapezius strength  5/5  XI: sternocleidomastoid strength 5/5  XI: neck flexion strength  5/5  XII: tongue strength  Normal    Data Review Lab Results  Component Value Date   WBC 7.9 06/02/2013   HGB 14.8 06/02/2013   HCT 42.5 06/02/2013   MCV 89.5 06/02/2013   PLT 178 06/02/2013   Lab Results  Component Value Date   NA 137 06/02/2013   K 4.5 06/02/2013   CL 102 06/02/2013   CO2 22 06/02/2013   BUN 6 06/02/2013   CREATININE 0.90 06/02/2013   GLUCOSE 214* 06/02/2013   No results found for this basename: INR, PROTIME    Assessment/Plan: L4-5 and L5-S1 disc degeneration, lumbago, lumbar radiculopathy: I discussed the situation with the patient. I have reviewed his imaging studies with them and pointed out the abnormalities. We have discussed the various treatment options including surgery. I described the surgical treatment option of exploration of his lumbar fusion and at L4-5 and L5-S1 decompression, instrumentation, and fusion. I have shown him surgical models. We have discussed the risks, benefits, alternatives, and likelihood of achieving our goals with surgery. I have answered all the patient's questions. He has decided to proceed with surgery.   [name] Sessler D 06/12/2013 7:22 AM

## 2013-06-12 NOTE — Progress Notes (Signed)
Patient ID: Samuel [name], male   DOB: 1975-06-27, 38 y.o.   MRN: [id number] Subjective:  The patient is alert and pleasant. His back is appropriately sore.  Objective: Vital signs in last 24 hours: Temp:  [98.2 F (36.8 C)] 98.2 F (36.8 C) (01/19 1330) Pulse Rate:  [78-105] 105 (01/19 1409) Resp:  [15-21] 18 (01/19 1409) BP: (114-144)/(62-91) 114/91 mmHg (01/19 1409) SpO2:  [98 %-100 %] 100 % (01/19 1409)  Intake/Output from previous day:   Intake/Output this shift: Total I/O In: 3550 [I.V.:3300; IV Piggyback:250] Out: 640 [Urine:340; Blood:300]  Physical exam the patient is alert and oriented. He is moving his lower extremities well.  Lab Results: No results found for this basename: WBC, HGB, HCT, PLT,  in the last 72 hours BMET No results found for this basename: NA, K, CL, CO2, GLUCOSE, BUN, CREATININE, CALCIUM,  in the last 72 hours  Studies/Results: Dg Lumbar Spine 1 View  06/12/2013   CLINICAL DATA:  Posterior lumbar fusion.  EXAM: LUMBAR SPINE - 1 VIEW  COMPARISON:  None.  FINDINGS: Single lateral portable radiograph of the lumbar spine was submitted for interpretation. Tissue spreaders are identified posterior to the L4 and L5 vertebra. There is a surgical probe which is posterior to the L5-S1 disc space. Changes from previous L3-4 PLIF are again noted.  IMPRESSION: 1. Surgical probe localizes the L5-S1 disc space.   Electronically Signed   By: Kerby Moors M.D.   On: 06/12/2013 11:00    Assessment/Plan: The patient is doing well.  LOS: 0 days     Jamylah Marinaccio D 06/12/2013, 2:13 PM

## 2013-06-12 NOTE — Preoperative (Signed)
Beta Blockers   Reason not to administer Beta Blockers:Not Applicable 

## 2013-06-12 NOTE — Anesthesia Preprocedure Evaluation (Signed)
Anesthesia Evaluation  Patient identified by MRN, date of birth, ID band Patient awake    Reviewed: Allergy & Precautions, H&P , NPO status , Patient's Chart, lab work & pertinent test results  History of Anesthesia Complications Negative for: history of anesthetic complications  Airway Mallampati: II TM Distance: >3 FB Neck ROM: Full    Dental  (+) Chipped and Dental Advisory Given   Pulmonary neg pulmonary ROS,  breath sounds clear to auscultation  Pulmonary exam normal       Cardiovascular negative cardio ROS  Rhythm:Regular Rate:Normal     Neuro/Psych Chronic back pain: narcotics daily    GI/Hepatic Neg liver ROS, GERD-  Controlled,  Endo/Other  Morbid obesity  Renal/GU negative Renal ROS     Musculoskeletal   Abdominal (+) + obese,   Peds  Hematology negative hematology ROS (+)   Anesthesia Other Findings   Reproductive/Obstetrics                           Anesthesia Physical Anesthesia Plan  ASA: III  Anesthesia Plan: General   Post-op Pain Management:    Induction: Intravenous  Airway Management Planned: Oral ETT  Additional Equipment:   Intra-op Plan:   Post-operative Plan: Extubation in OR  Informed Consent: I have reviewed the patients History and Physical, chart, labs and discussed the procedure including the risks, benefits and alternatives for the proposed anesthesia with the patient or authorized representative who has indicated his/her understanding and acceptance.   Dental advisory given  Plan Discussed with: CRNA and Surgeon  Anesthesia Plan Comments: (Plan routine monitors, GETA)        Anesthesia Quick Evaluation

## 2013-06-12 NOTE — Progress Notes (Signed)
Dr. Glennon Mac aware of pts pain after meds, versed and torodol given

## 2013-06-13 LAB — BASIC METABOLIC PANEL
BUN: 9 mg/dL (ref 6–23)
CO2: 26 mEq/L (ref 19–32)
Calcium: 8.1 mg/dL — ABNORMAL LOW (ref 8.4–10.5)
Chloride: 100 mEq/L (ref 96–112)
Creatinine, Ser: 0.97 mg/dL (ref 0.50–1.35)
GFR calc Af Amer: >90 mL/min (ref >90)
GFR calc non Af Amer: >90 mL/min (ref >90)
Glucose, Bld: 102 mg/dL — ABNORMAL HIGH (ref 70–99)
Potassium: 4.4 mEq/L (ref 3.7–5.3)
Sodium: 136 mEq/L — ABNORMAL LOW (ref 137–147)

## 2013-06-13 LAB — CBC
HCT: 34.1 % — ABNORMAL LOW (ref 39.0–52.0)
Hemoglobin: 11.6 g/dL — ABNORMAL LOW (ref 13.0–17.0)
MCH: 30.5 pg (ref 26.0–34.0)
MCHC: 34 g/dL (ref 30.0–36.0)
MCV: 89.7 fL (ref 78.0–100.0)
Platelets: 138 10*3/uL — ABNORMAL LOW (ref 150–400)
RBC: 3.8 MIL/uL — ABNORMAL LOW (ref 4.22–5.81)
RDW: 12.6 % (ref 11.5–15.5)
WBC: 10.7 10*3/uL — ABNORMAL HIGH (ref 4.0–10.5)

## 2013-06-13 MED ORDER — MORPHINE SULFATE ER 15 MG PO TBCR
60.0000 mg | EXTENDED_RELEASE_TABLET | Freq: Two times a day (BID) | ORAL | Status: DC
  Administered 2013-06-13 – 2013-06-15 (×5): 60 mg via ORAL
  Filled 2013-06-13 (×5): qty 4

## 2013-06-13 MED FILL — Heparin Sodium (Porcine) Inj 1000 Unit/ML: INTRAMUSCULAR | Qty: 30 | Status: AC

## 2013-06-13 MED FILL — Sodium Chloride IV Soln 0.9%: INTRAVENOUS | Qty: 1000 | Status: AC

## 2013-06-13 NOTE — Evaluation (Signed)
Occupational Therapy Evaluation Patient Details Name: Samuel [name] MRN: [id number] DOB: [birth year] Today's Date: 06/13/2013 Time: 5427-0623 OT Time Calculation (min): 20 min  OT Assessment / Plan / Recommendation History of present illness Patient is a 38 yo male, s/p L5-S1 lumbar fusion.   Clinical Impression   Pt presents with below problem list. Pt independent with ADLs, PTA. Feel pt will benefit from acute OT to increase independence prior to d/c.     OT Assessment  Patient needs continued OT Services    Follow Up Recommendations  No OT follow up;Supervision/Assistance - 24 hour    Barriers to Discharge      Equipment Recommendations  Other (comment) (large size 3 in 1)    Recommendations for Other Services    Frequency  Min 2X/week    Precautions / Restrictions Precautions Precautions: Back Precaution Booklet Issued: No Precaution Comments: Reviewed precautions with pt Required Braces or Orthoses: Spinal Brace Spinal Brace: Lumbar corset;Applied in sitting position Restrictions Weight Bearing Restrictions: No   Pertinent Vitals/Pain Pain 8/10. Repositioned.     ADL  Grooming: Wash/dry face;Min guard Where Assessed - Grooming: Supported standing Upper Body Dressing: Minimal assistance (back brace) Where Assessed - Upper Body Dressing: Unsupported sitting Lower Body Dressing: Maximal assistance Where Assessed - Lower Body Dressing: Supported sit to Tree surgeon Transfer: +2 Total assistance;Moderate assistance Toilet Transfer Method: Sit to Loss adjuster, chartered: Other (comment) (from recliner chair) Tub/Shower Transfer Method: Not assessed Equipment Used: Gait belt;Long-handled shoe horn;Long-handled sponge;Reacher;Rolling walker;Sock aid;Back brace Transfers/Ambulation Related to ADLs: assisted minimally with walker in bathroom; +2 Total A-Mod A/Min A/Min guard for transfers. ADL Comments: Educated on AE for LB ADLs as well as toilet aide for  hygiene. Recommended sitting for bathing and dressing. Recommended standing in front of chair/bed with walker in front when pulling up LB clothing. Discussed use of bag on walker to carry items. Pt states family to assist with dressing. Pt leg appeared to be giving out while at sink.      OT Diagnosis: Acute pain  OT Problem List: Decreased strength;Impaired balance (sitting and/or standing);Decreased range of motion;Decreased activity tolerance;Decreased knowledge of use of DME or AE;Decreased knowledge of precautions;Pain OT Treatment Interventions: Self-care/ADL training;DME and/or AE instruction;Therapeutic activities;Balance training;Patient/family education   OT Goals(Current goals can be found in the care plan section) Acute Rehab OT Goals Patient Stated Goal: not stated OT Goal Formulation: With patient Time For Goal Achievement: 06/20/13 Potential to Achieve Goals: Good ADL Goals Pt Will Perform Grooming: with modified independence;standing Pt Will Transfer to Toilet: with modified independence;ambulating (3 in 1 over commode) Pt Will Perform Toileting - Clothing Manipulation and hygiene: with modified independence;sit to/from stand Pt Will Perform Tub/Shower Transfer: Tub transfer;with supervision;rolling walker;ambulating (tub equipment tbd) Additional ADL Goal #1: Pt will independently verbalize and demonstrate 3/3 back precautions.   Visit Information  Last OT Received On: 06/13/13 Assistance Needed: +1 History of Present Illness: Patient is a 38 yo male, s/p L5-S1 lumbar fusion.       Prior [place] expects to be discharged to:: Private residence Living Arrangements: Spouse/significant other;Children Available Help at Discharge: Family Type of Home: House Home Access: Stairs to enter Technical brewer of Steps: 1 Entrance Stairs-Rails: None Home Layout: One level Home Equipment: Environmental consultant - 2 wheels;Adaptive equipment Adaptive  Equipment: Long-handled sponge;Reacher Additional Comments: tub shower, elevated toilets Prior Function Level of Independence: Independent Communication Communication: No difficulties Dominant Hand: Right  Vision/Perception     Cognition  Cognition Arousal/Alertness: Awake/alert Behavior During Therapy: WFL for tasks assessed/performed Overall Cognitive Status: Within Functional Limits for tasks assessed    Extremity/Trunk Assessment Upper Extremity Assessment Upper Extremity Assessment: Overall WFL for tasks assessed Lower Extremity Assessment Lower Extremity Assessment: LLE deficits/detail LLE Sensation: decreased light touch     Mobility Bed Mobility Overal bed mobility: Needs Assistance Bed Mobility: Rolling;Sit to Sidelying Rolling: Min guard Supine to sit: Supervision Sit to sidelying: Min guard General bed mobility comments: Cues for technique. Min guard for safety. Transfers Overall transfer level: Needs assistance Equipment used: Rolling walker (2 wheeled) Transfers: Sit to/from Stand Sit to Stand: +2 physical assistance;Mod assist;Min assist;Min guard General transfer comment: +2 Total A/Mod A for sit to stand from recliner chair. Min guard for stand to sit transfers. Min A for sit to stand from bed. Cues for hand placement.     Exercise        End of Session OT - End of Session Equipment Utilized During Treatment: Gait belt;Rolling walker;Back brace Activity Tolerance: Patient tolerated treatment well Patient left: in bed;with call bell/phone within reach  [place], [place] OTR/L [phone] 06/13/2013, 10:22 AM

## 2013-06-13 NOTE — Progress Notes (Signed)
Patient ID: Samuel [name], male   DOB: 09-22-1975, 38 y.o.   MRN: [id number] Subjective:  The patient is alert and pleasant. He looks well. His back is sore.  Objective: Vital signs in last 24 hours: Temp:  [97.4 F (36.3 C)-98.2 F (36.8 C)] 97.4 F (36.3 C) (01/20 0540) Pulse Rate:  [77-108] 77 (01/20 0540) Resp:  [13-25] 20 (01/20 0711) BP: (108-155)/(45-91) 129/64 mmHg (01/20 0540) SpO2:  [94 %-100 %] 97 % (01/20 0711) Weight:  [160.483 kg (353 lb 12.8 oz)] 160.483 kg (353 lb 12.8 oz) (01/19 1629)  Intake/Output from previous day: 01/19 0701 - 01/20 0700 In: 4150 [P.O.:600; I.V.:3300; IV Piggyback:250] Out: 7017 [Urine:4340; Drains:400; Blood:300] Intake/Output this shift: Total I/O In: -  Out: 350 [Urine:350]  Physical exam the patient is alert and oriented. His dressing is clean and dry. He is moving his lower extremities well.  Lab Results:  Recent Labs  06/13/13 0538  WBC 10.7*  HGB 11.6*  HCT 34.1*  PLT 138*   BMET  Recent Labs  06/13/13 0538  NA 136*  K 4.4  CL 100  CO2 26  GLUCOSE 102*  BUN 9  CREATININE 0.97  CALCIUM 8.1*    Studies/Results: Dg Lumbar Spine 2-3 Views  06/12/2013   CLINICAL DATA:  L4-S1 fusion.  History of prior L3-4 fusion.  EXAM: LUMBAR SPINE - 2-3 VIEW; DG C-ARM 1-60 MIN  COMPARISON:  MRI lumbar spine 03/26/2013.  FINDINGS: Three fluoroscopic intraoperative spot views of the lumbar spine are provided. Images demonstrate pedicle screws and interbody spacers at L4-5 and L5-S1. Prior L3-4 fusion noted.  IMPRESSION: L4-S1 fusion in progress.   Electronically Signed   By: Inge Rise M.D.   On: 06/12/2013 14:22   Dg Lumbar Spine 1 View  06/12/2013   CLINICAL DATA:  Posterior lumbar fusion.  EXAM: LUMBAR SPINE - 1 VIEW  COMPARISON:  None.  FINDINGS: Single lateral portable radiograph of the lumbar spine was submitted for interpretation. Tissue spreaders are identified posterior to the L4 and L5 vertebra. There is a surgical probe  which is posterior to the L5-S1 disc space. Changes from previous L3-4 PLIF are again noted.  IMPRESSION: 1. Surgical probe localizes the L5-S1 disc space.   Electronically Signed   By: Kerby Moors M.D.   On: 06/12/2013 11:00   Dg C-arm 1-60 Min  06/12/2013   CLINICAL DATA:  L4-S1 fusion.  History of prior L3-4 fusion.  EXAM: LUMBAR SPINE - 2-3 VIEW; DG C-ARM 1-60 MIN  COMPARISON:  MRI lumbar spine 03/26/2013.  FINDINGS: Three fluoroscopic intraoperative spot views of the lumbar spine are provided. Images demonstrate pedicle screws and interbody spacers at L4-5 and L5-S1. Prior L3-4 fusion noted.  IMPRESSION: L4-S1 fusion in progress.   Electronically Signed   By: Inge Rise M.D.   On: 06/12/2013 14:22    Assessment/Plan: Postop day #1: Patient is doing well. We will mobilize him with PT and OT. I'll add MS Contin as he was taking by mouth morphine preop. We will plan to discontinue his PCA tomorrow.  LOS: 1 day     Mykia Holton D 06/13/2013, 7:54 AM

## 2013-06-13 NOTE — Evaluation (Signed)
Physical Therapy Evaluation Patient Details Name: Samuel [name] MRN: [id number] DOB: [birth year] Today's Date: 06/13/2013 Time: 5366-4403 PT Time Calculation (min): 27 min  PT Assessment / Plan / Recommendation History of Present Illness  Patient is a 38 yo male, s/p L5-S1 lumbar fusion.  Clinical Impression  Patient demonstrates deficits in functional mobility as indicated below. Patient will benefit from continued skilled PT to address deficits and maximize function. Will continue to see as indicated and progress as tolerated.     PT Assessment  Patient needs continued PT services    Follow Up Recommendations  No PT follow up;Supervision/Assistance - 24 hour          Equipment Recommendations  3in1 (PT)       Frequency Min 5X/week    Precautions / Restrictions Precautions Precautions: Back Precaution Comments: Verbally educated on precautions with teach back to reinforce.  Required Braces or Orthoses: Spinal Brace Spinal Brace: Lumbar corset;Applied in sitting position Restrictions Weight Bearing Restrictions: No   Pertinent Vitals/Pain 6/10 pain prior to activity (improved with ambulation)      Mobility  Bed Mobility Overal bed mobility: Needs Assistance Bed Mobility: Rolling;Supine to Sit Rolling: Supervision Supine to sit: Supervision General bed mobility comments: increased time to perform, no physical assist, VCs for precautions Transfers Overall transfer level: Needs assistance Equipment used: Rolling walker (2 wheeled) Transfers: Sit to/from Stand Sit to Stand: Min assist General transfer comment: Min assist to stabilize RW, no physical hands on assist for the patient, able to push up using RW (unable to push from bed secondary to increased pain and pt height) Ambulation/Gait Ambulation/Gait assistance: Supervision;Min guard Ambulation Distance (Feet): 160 Feet Assistive device: Rolling walker (2 wheeled) Gait Pattern/deviations: Step-through  pattern;Decreased stride length Gait velocity: decreased Gait velocity interpretation: Below normal speed for age/gender General Gait Details: patient steady with ambulation but very limited and guarded in gait secondary to increased pain    Exercises     PT Diagnosis: Difficulty walking;Acute pain  PT Problem List: Decreased strength;Decreased range of motion;Decreased activity tolerance;Decreased balance;Decreased mobility;Pain PT Treatment Interventions: DME instruction;Gait training;Stair training;Functional mobility training;Therapeutic activities;Therapeutic exercise;Balance training;Patient/family education     PT Goals(Current goals can be found in the care plan section) Acute Rehab PT Goals Patient Stated Goal: to go home PT Goal Formulation: With patient/family Time For Goal Achievement: 06/27/13 Potential to Achieve Goals: Good  Visit Information  Last PT Received On: 06/13/13 Assistance Needed: +1 History of Present Illness: Patient is a 38 yo male, s/p L5-S1 lumbar fusion.       Prior [place] expects to be discharged to:: Private residence Living Arrangements: Spouse/significant other;Children Available Help at Discharge: Family Type of Home: House Home Access: Stairs to enter Technical brewer of Steps: 1 Entrance Stairs-Rails: None Home Layout: One level Home Equipment: Environmental consultant - 2 wheels Additional Comments: tub shower, elevated toilets Prior Function Level of Independence: Independent Communication Communication: No difficulties Dominant Hand: Right    Cognition  Cognition Arousal/Alertness: Awake/alert    Extremity/Trunk Assessment Upper Extremity Assessment Upper Extremity Assessment: Defer to OT evaluation Lower Extremity Assessment Lower Extremity Assessment: LLE deficits/detail LLE Sensation: decreased light touch   Balance Balance Overall balance assessment: No apparent balance deficits (not formally  assessed)  End of Session PT - End of Session Equipment Utilized During Treatment: Gait belt;Back brace Activity Tolerance: Patient limited by pain Patient left: in chair;with call bell/phone within reach Nurse Communication: Mobility status  GP     Alben Deeds  J 06/13/2013, 9:21 AM Alben Deeds, PT DPT  [phone]

## 2013-06-13 NOTE — Progress Notes (Signed)
UR complete.  Benzion Mesta RN, MSN 

## 2013-06-14 NOTE — Progress Notes (Addendum)
Wrong charting.

## 2013-06-14 NOTE — Progress Notes (Signed)
Physical Therapy Treatment Patient Details Name: Samuel [name] MRN: [id number] DOB: July 23, 1975 Today's Date: 06/14/2013 Time: 8588-5027 PT Time Calculation (min): 28 min  PT Assessment / Plan / Recommendation  History of Present Illness Patient is a 38 yo male, s/p L5-S1 lumbar fusion.   PT Comments   Patient tolerated increased ambulation today, also performed transfers from various surfaces.  Patient still requires assist to stabilize RW so that patient can use RW to come to standing position secondary to patient stature.  Will continue to progress activity with patient as tolerated. Educated patient and fiance about mobility expectations for today and encouraged patient for EOB sitting as patient can not tolerated sitting in recliner provided.   Follow Up Recommendations  No PT follow up;Supervision/Assistance - 24 hour     Does the patient have the potential to tolerate intense rehabilitation     Barriers to Discharge        Equipment Recommendations  3in1 (PT)    Recommendations for Other Services    Frequency Min 5X/week   Progress towards PT Goals Progress towards PT goals: Progressing toward goals  Plan Current plan remains appropriate    Precautions / Restrictions Precautions Precautions: Back Precaution Comments: Verbally educated on precautions with teach back to reinforce.  Required Braces or Orthoses: Spinal Brace Spinal Brace: Lumbar corset;Applied in sitting position Restrictions Weight Bearing Restrictions: No   Pertinent Vitals/Pain 7/10    Mobility  Bed Mobility Overal bed mobility: Needs Assistance Bed Mobility: Rolling;Supine to Sit Rolling: Supervision Supine to sit: Supervision General bed mobility comments: increased time to perform, no physical assist, VCs for precautions Transfers Overall transfer level: Needs assistance (Grab bar used to stand from toilet) Equipment used: Rolling walker (2 wheeled) Transfers: Sit to/from Stand Sit to Stand:  Min assist General transfer comment: Min assist to stabilize RW, no physical hands on assist for the patient, able to push up using RW (unable to push from bed secondary to increased pain and pt height) Ambulation/Gait Ambulation/Gait assistance: Supervision Ambulation Distance (Feet): 440 Feet Assistive device: Rolling walker (2 wheeled) Gait Pattern/deviations: Step-through pattern;Decreased stride length Gait velocity: decreased Gait velocity interpretation: Below normal speed for age/gender General Gait Details: Improved activity tolerance with ambulation      PT Goals (current goals can now be found in the care plan section) Acute Rehab PT Goals Patient Stated Goal: to go home PT Goal Formulation: With patient/family Time For Goal Achievement: 06/27/13 Potential to Achieve Goals: Good  Visit Information  Last PT Received On: 06/14/13 Assistance Needed: +1 History of Present Illness: Patient is a 38 yo male, s/p L5-S1 lumbar fusion.    Subjective Data  Subjective: Im a little better Patient Stated Goal: to go home   Cognition  Cognition Arousal/Alertness: Awake/alert    Balance  Balance Overall balance assessment: No apparent balance deficits (not formally assessed)  End of Session PT - End of Session Equipment Utilized During Treatment: Gait belt;Back brace Activity Tolerance: Patient limited by pain Patient left: in bed;with call bell/phone within reach;with nursing/sitter in room;with family/visitor present (sitting EOB) Nurse Communication: Mobility status   GP     Duncan Dull 06/14/2013, 1:54 PM Alben Deeds, [place] DPT  [phone]

## 2013-06-14 NOTE — Progress Notes (Signed)
Patient ID: Samuel [name], male   DOB: Mar 29, 1976, 38 y.o.   MRN: [id number] Subjective:  The patient is alert and pleasant. His back is appropriately sore.  Objective: Vital signs in last 24 hours: Temp:  [98 F (36.7 C)-100.6 F (38.1 C)] 98 F (36.7 C) (01/21 1316) Pulse Rate:  [82-96] 95 (01/21 1316) Resp:  [15-22] 20 (01/21 1316) BP: (104-139)/(52-97) 107/69 mmHg (01/21 1316) SpO2:  [90 %-100 %] 99 % (01/21 1316)  Intake/Output from previous day: 01/20 0701 - 01/21 0700 In: 600 [P.O.:600] Out: 2625 [Urine:2400; Drains:225] Intake/Output this shift: Total I/O In: 960 [P.O.:960] Out: 545 [Urine:500; Drains:45]  Physical exam the patient is alert and oriented. His strength is normal.  Lab Results:  Recent Labs  06/13/13 0538  WBC 10.7*  HGB 11.6*  HCT 34.1*  PLT 138*   BMET  Recent Labs  06/13/13 0538  NA 136*  K 4.4  CL 100  CO2 26  GLUCOSE 102*  BUN 9  CREATININE 0.97  CALCIUM 8.1*    Studies/Results: No results found.  Assessment/Plan: Postop day #2: The patient is doing well. I will discontinue his Hemovac. He will likely go home tomorrow.  Low-grade fever: We will observe and work up as necessary.  LOS: 2 days     Samuel [name] D 06/14/2013, 3:28 PM

## 2013-06-15 MED ORDER — OXYCODONE-ACETAMINOPHEN 10-325 MG PO TABS: 1.0000 | ORAL_TABLET | ORAL | Status: DC | PRN

## 2013-06-15 MED ORDER — DIAZEPAM 5 MG PO TABS: 5.0000 mg | ORAL_TABLET | Freq: Four times a day (QID) | ORAL | Status: DC | PRN

## 2013-06-15 MED ORDER — DSS 100 MG PO CAPS: 100.0000 mg | ORAL_CAPSULE | Freq: Two times a day (BID) | ORAL | Status: DC

## 2013-06-15 MED ORDER — MORPHINE SULFATE ER 60 MG PO TBCR: 60.0000 mg | EXTENDED_RELEASE_TABLET | Freq: Two times a day (BID) | ORAL | Status: DC

## 2013-06-15 NOTE — Progress Notes (Signed)
Occupational Therapy Treatment Patient Details Name: Samuel [name] MRN: [id number] DOB: 11-Jun-1975 Today's Date: 06/15/2013 Time: 5176-1607 OT Time Calculation (min): 13 min  OT Assessment / Plan / Recommendation  History of present illness Patient is a 38 yo male, s/p L5-S1 lumbar fusion.   OT comments  Pt is knowledgeable in back precautions, overall functioning at a supervision level in ADL transfers.  Pt has AE at home for ADL and wife will supervise initially when pt returns home.  Follow Up Recommendations  No OT follow up;Supervision/Assistance - 24 hour    Barriers to Discharge       Equipment Recommendations  Other (comment) bariatric 3 in 1   Recommendations for Other Services    Frequency Min 2X/week   Progress towards OT Goals Progress towards OT goals: Progressing toward goals  Plan Discharge plan remains appropriate    Precautions / Restrictions Precautions Precautions: Back Precaution Comments: Patient able to recall all precautions Required Braces or Orthoses: Spinal Brace Spinal Brace: Lumbar corset;Applied in sitting position Restrictions Weight Bearing Restrictions: No   Pertinent Vitals/Pain LEs, repositioned, RN notified    ADL  Grooming: Wash/dry hands;Wash/dry face;Supervision/safety Where Assessed - Grooming: Unsupported standing Toilet Transfer: Copy Method: Sit to Loss adjuster, chartered: Comfort height toilet Toileting - Clothing Manipulation and Hygiene: Moderate assistance (pt to purchase toilet tongs in the community) Where Assessed - Toileting Clothing Manipulation and Hygiene: Sit on 3-in-1 or toilet Tub/Shower Transfer: Supervision/safety Tub/Shower Transfer Method: Ambulating Equipment Used: Back brace;Rolling walker Transfers/Ambulation Related to ADLs: supervision with RW ADL Comments: Pt is knowledgeable in back precautions in mobility and ADL.    OT Diagnosis:    OT Problem List:   OT  Treatment Interventions:     OT Goals(current goals can now be found in the care plan section) Acute Rehab OT Goals Patient Stated Goal: to go home  Visit Information  Last OT Received On: 06/15/13 Assistance Needed: +1 History of Present Illness: Patient is a 38 yo male, s/p L5-S1 lumbar fusion.    Subjective Data      Prior Functioning       Cognition  Cognition Arousal/Alertness: Awake/alert Behavior During Therapy: WFL for tasks assessed/performed Overall Cognitive Status: Within Functional Limits for tasks assessed    Mobility  Bed Mobility Overal bed mobility: Modified Independent Sit to sidelying: Modified independent (Device/Increase time) General bed mobility comments: increased time to perform, no physical assist, used log roll Transfers Transfers: Sit to/from Stand Sit to Stand: Supervision General transfer comment: Supervision for safety    Exercises      Balance    End of Session OT - End of Session Activity Tolerance: Patient tolerated treatment well Patient left: in bed;with call bell/phone within reach  GO     Malka So 06/15/2013, 10:29 AM [phone]

## 2013-06-15 NOTE — Progress Notes (Signed)
Physical Therapy Treatment Patient Details Name: Samuel [name] MRN: [id number] DOB: 09-01-1975 Today's Date: 06/15/2013 Time: 0952-1009 PT Time Calculation (min): 17 min  PT Assessment / Plan / Recommendation  History of Present Illness Patient is a 38 yo male, s/p L5-S1 lumbar fusion.   PT Comments   Patient progressing well with ambulation. Able to complete stair training this morning. Anticipate DC later today  Follow Up Recommendations  No PT follow up;Supervision/Assistance - 24 hour     Does the patient have the potential to tolerate intense rehabilitation     Barriers to Discharge        Equipment Recommendations  3in1 (PT)    Recommendations for Other Services    Frequency Min 5X/week   Progress towards PT Goals Progress towards PT goals: Progressing toward goals  Plan Current plan remains appropriate    Precautions / Restrictions Precautions Precautions: Back Precaution Comments: Patient able to recall all precautions Required Braces or Orthoses: Spinal Brace Spinal Brace: Lumbar corset;Applied in sitting position   Pertinent Vitals/Pain Complained of R leg pain. OT planning to call nursing when back to room     Mobility  Bed Mobility Overal bed mobility: Modified Independent Transfers Sit to Stand: Supervision General transfer comment: Supervision for safety Ambulation/Gait Ambulation/Gait assistance: Supervision Ambulation Distance (Feet): 250 Feet Assistive device: Rolling walker (2 wheeled) Gait Pattern/deviations: Step-through pattern;Decreased stride length Gait velocity: decreased General Gait Details: Patient with increased R leg pain with increased ambulation Stairs: Yes Stairs assistance: Supervision Stair Management: Step to pattern;Forwards Number of Stairs: 2    Exercises     PT Diagnosis:    PT Problem List:   PT Treatment Interventions:     PT Goals (current goals can now be found in the care plan section)    Visit Information  Last PT Received On: 06/15/13 Assistance Needed: +1 History of Present Illness: Patient is a 38 yo male, s/p L5-S1 lumbar fusion.    Subjective Data      Cognition  Cognition Arousal/Alertness: Awake/alert Behavior During Therapy: WFL for tasks assessed/performed Overall Cognitive Status: Within Functional Limits for tasks assessed    Balance     End of Session PT - End of Session Equipment Utilized During Treatment: Gait belt;Back brace Activity Tolerance: Patient tolerated treatment well Patient left: in chair (with OT in gym) Nurse Communication: Mobility status   GP     Jacqualyn Posey 06/15/2013, 10:12 AM 06/15/2013 Jacqualyn Posey PTA [phone] pager [phone] office

## 2013-06-15 NOTE — Discharge Summary (Signed)
Physician Discharge Summary  Patient ID: Samuel [name] MRN: [id number] DOB/AGE: September 09, 1975 38 y.o.  Admit date: 06/12/2013 Discharge date: 06/15/2013  Admission Diagnoses: L4-5 and L5-S1 disc degeneration, lumbago, lumbar radiculopathy  Discharge Diagnoses: The same Active Problems:   Lumbar degenerative disc disease   Discharged Condition: good  Hospital Course: I performed an L4-5 and L5-S1 decompression, instrumentation, and fusion on the patient on 06/12/2013. The surgery went well.  The patient's postoperative course was unremarkable. On postop day #3 he requested discharge to home. He was given oral and written discharge instructions. All his questions were answered.  Consults: PT OT Significant Diagnostic Studies: None Treatments: Exploration of lumbar fusion, L4-5 and L5-S1 decompression, instrumentation, and fusion. Discharge Exam: Blood pressure 108/55, pulse 88, temperature 98.3 F (36.8 C), temperature source Oral, resp. rate 18, height 6\' 1"  (1.854 m), weight 160.483 kg (353 lb 12.8 oz), SpO2 99.00%. The patient is alert and pleasant. He looks well. His neck is normal his lower extremities.  Disposition: Home  Discharge Orders   Future Orders Complete By Expires   Call MD for:  difficulty breathing, headache or visual disturbances  As directed    Call MD for:  extreme fatigue  As directed    Call MD for:  hives  As directed    Call MD for:  persistant dizziness or light-headedness  As directed    Call MD for:  persistant nausea and vomiting  As directed    Call MD for:  redness, tenderness, or signs of infection (pain, swelling, redness, odor or green/yellow discharge around incision site)  As directed    Call MD for:  severe uncontrolled pain  As directed    Call MD for:  temperature >100.4  As directed    Diet - low sodium heart healthy  As directed    Discharge instructions  As directed    Comments:     Call [phone] for a followup appointment. Take a  stool softener while you are using pain medications.   Driving Restrictions  As directed    Comments:     Do not drive for 2 weeks.   Increase activity slowly  As directed    Lifting restrictions  As directed    Comments:     Do not lift more than 5 pounds. No excessive bending or twisting.   May shower / Bathe  As directed    Comments:     He may shower after the pain she is removed 3 days after surgery. Leave the incision alone.   No dressing needed  As directed        Medication List    STOP taking these medications       ibuprofen 800 MG tablet  Commonly known as:  ADVIL,MOTRIN     morphine 30 MG tablet  Commonly known as:  MSIR  Replaced by:  morphine 60 MG 12 hr tablet      TAKE these medications       diazepam 5 MG tablet  Commonly known as:  VALIUM  Take 1 tablet (5 mg total) by mouth every 6 (six) hours as needed for muscle spasms.     DSS 100 MG Caps  Take 100 mg by mouth 2 (two) times daily.     gabapentin 300 MG capsule  Commonly known as:  NEURONTIN  Take 600 mg by mouth at bedtime.     morphine 60 MG 12 hr tablet  Commonly known as:  MS CONTIN  Take 1 tablet (60 mg total) by mouth every 12 (twelve) hours.     OVER THE COUNTER MEDICATION  Take 2 tablets by mouth daily. Generic Brand Stool Softner     oxyCODONE-acetaminophen 10-325 MG per tablet  Commonly known as:  PERCOCET  Take 1 tablet by mouth every 6 (six) hours as needed for pain.     oxyCODONE-acetaminophen 10-325 MG per tablet  Commonly known as:  PERCOCET  Take 1 tablet by mouth every 4 (four) hours as needed for pain.         SignedNewman Pies D 06/15/2013, 11:09 AM

## 2013-06-15 NOTE — Progress Notes (Signed)
Patient waiting to be discharged.  All education and paperwork completed.  IV removed and prescriptions given to patient.  Kizzie Bane, RN

## 2014-03-30 ENCOUNTER — Encounter: Payer: Self-pay | Admitting: Physical Medicine & Rehabilitation

## 2014-04-04 ENCOUNTER — Other Ambulatory Visit: Payer: Self-pay | Admitting: Neurosurgery

## 2014-04-04 DIAGNOSIS — M545 Low back pain, unspecified: Secondary | ICD-10-CM

## 2014-04-05 ENCOUNTER — Ambulatory Visit (HOSPITAL_BASED_OUTPATIENT_CLINIC_OR_DEPARTMENT_OTHER): Payer: Federal, State, Local not specified - PPO | Admitting: Physical Medicine & Rehabilitation

## 2014-04-05 ENCOUNTER — Other Ambulatory Visit: Payer: Self-pay | Admitting: Physical Medicine & Rehabilitation

## 2014-04-05 ENCOUNTER — Encounter: Payer: Self-pay | Admitting: Physical Medicine & Rehabilitation

## 2014-04-05 ENCOUNTER — Ambulatory Visit: Payer: Medicare Other | Admitting: Physical Medicine & Rehabilitation

## 2014-04-05 ENCOUNTER — Encounter: Payer: Medicare Other | Attending: Physical Medicine & Rehabilitation

## 2014-04-05 VITALS — BP 133/69 | HR 87 | Resp 14 | Ht 71.0 in | Wt 350.0 lb

## 2014-04-05 DIAGNOSIS — M533 Sacrococcygeal disorders, not elsewhere classified: Secondary | ICD-10-CM

## 2014-04-05 DIAGNOSIS — M545 Low back pain: Secondary | ICD-10-CM | POA: Diagnosis not present

## 2014-04-05 DIAGNOSIS — Z981 Arthrodesis status: Secondary | ICD-10-CM | POA: Diagnosis not present

## 2014-04-05 DIAGNOSIS — M5136 Other intervertebral disc degeneration, lumbar region: Secondary | ICD-10-CM

## 2014-04-05 DIAGNOSIS — M961 Postlaminectomy syndrome, not elsewhere classified: Secondary | ICD-10-CM | POA: Insufficient documentation

## 2014-04-05 DIAGNOSIS — Y838 Other surgical procedures as the cause of abnormal reaction of the patient, or of later complication, without mention of misadventure at the time of the procedure: Secondary | ICD-10-CM | POA: Insufficient documentation

## 2014-04-05 DIAGNOSIS — M79605 Pain in left leg: Secondary | ICD-10-CM | POA: Diagnosis not present

## 2014-04-05 DIAGNOSIS — G8928 Other chronic postprocedural pain: Secondary | ICD-10-CM | POA: Insufficient documentation

## 2014-04-05 DIAGNOSIS — G8929 Other chronic pain: Secondary | ICD-10-CM | POA: Insufficient documentation

## 2014-04-05 DIAGNOSIS — M5416 Radiculopathy, lumbar region: Secondary | ICD-10-CM

## 2014-04-05 DIAGNOSIS — M79604 Pain in right leg: Secondary | ICD-10-CM | POA: Diagnosis not present

## 2014-04-05 NOTE — Progress Notes (Signed)
Subjective:    Patient ID: Samuel [name], male    DOB: December 23, 1975, 38 y.o.   MRN: [id number]  HPI Chief complaint: Low back pain with bilateral lower extremity pain. History of present illness  Chronic low back pain underwent conservative care including injections, physical therapy, medication management prior to first lumbar surgery which was performed in in 2010. Had a L3-L4 fusion by Dr. [name].  Had continued pain and obtained a second neurosurgical consultation with Dr. [name], underwent L3-S1 fusion in January 2015. Continue to have postoperative pain. Has been on narcotic analgesicsSince 2011. Initially started out on hydrocodone and then was switched to morphine and oxycodone.  No PT since the most recent surgery. Plans to follow-up with neurosurgery after repeat lumbar MRI as well as lumbar CT. This will be just prior to Thanksgiving.  No bowel or bladder issues. No falls. Independent with dressing and bathing except occasionally needing help with lower legs. Also uses a long handled sponge Still driving Needs assistant with household duties as well as shopping. On disability since 02/23/2009  Needs laxative to keep bowels regular Pain Inventory Average Pain 6 Pain Right Now 6 My pain is sharp, stabbing and aching  In the last 24 hours, has pain interfered with the following? General activity 9 Relation with others 9 Enjoyment of life 9 What TIME of day is your pain at its worst? morning and night Sleep (in general) Poor  Pain is worse with: walking, bending, sitting and standing Pain improves with: rest and medication Relief from Meds: 5  Mobility walk without assistance how many minutes can you walk? 5-10 ability to climb steps?  yes do you drive?  yes  Function disabled: date disabled 02/23/14 I need assistance with the following:  bathing, household duties and shopping  Neuro/Psych weakness  Prior Studies Any changes since last visit?  no  Physicians  involved in your care Neurosurgeon jeffrey jenkins   History reviewed. No pertinent family history. History   Social History  . Marital Status: Married    Spouse Name: N/A    Number of Children: N/A  . Years of Education: N/A   Social History Main Topics  . Smoking status: Never Smoker   . Smokeless tobacco: None  . Alcohol Use: Yes     Comment: very rare (not since son was born)  . Drug Use: No  . Sexual Activity: None   Other Topics Concern  . None   Social History Narrative   Past Surgical History  Procedure Laterality Date  . Back surgery      lower back surgery x 2   Past Medical History  Diagnosis Date  . GERD (gastroesophageal reflux disease)   . Headache(784.0)   . Arthritis     "some in my back"   BP 133/69 mmHg  Pulse 87  Resp 14  Ht 5\' 11"  (1.803 m)  Wt 350 lb (158.759 kg)  BMI 48.84 kg/m2  SpO2 95%  Opioid Risk Score: 2 Fall Risk Score: Low Fall Risk (0-5 points) (educated and given handout on fall prevention in the home)  Review of Systems  Gastrointestinal: Positive for constipation.  Musculoskeletal: Positive for back pain.  Neurological: Positive for weakness.  All other systems reviewed and are negative.      Objective:   Physical Exam  Constitutional: He is oriented to person, place, and time.  Musculoskeletal:  + Faber's R Sacroiliac area    Neurological: He is alert and oriented to person, place, and  time. He displays no atrophy. Gait abnormal.  Reflex Scores:      Patellar reflexes are 1+ on the right side and 1+ on the left side.      Achilles reflexes are 2+ on the right side and 2+ on the left side. Decreased sensation left L3 dermatomal distribution.  Negative straight leg raising    Psychiatric: His mood appears anxious.   Motor strength is 5/5 bilateral hip flexors 4 minus at the knee extensors secondary to give way weakness and pain 5/5 in ankle dorsal flexor plantar flexion great toes extensors Lumbar range of  motion reduced less than 25% for flexion extension lateral 10th rotation and bending Ambulates with forward flexed posture no evidence of toe drag or knee instability  general: Moves slowly with position changes.       Assessment & Plan:  1. 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 He does have lumbar degenerative disc disease but also may have right sacroiliac dysfunction.  He has been on long-term narcotic analgesics. We discussed reducing dose below 120 mg of morphine equivalent to reduce incidence of adverse events including overdose. Check urine drug screen, has one-month supply left of current dose   We will do sacroiliac injection next visitTo further assess for possible right sacroiliac dysfunction   Patient also having further neurosurgical evaluation including imaging studies. If any surgery is planned. Will need to follow up with neurosurgery and would be happy to see him back once the patient is through the postoperative period

## 2014-04-05 NOTE — Patient Instructions (Addendum)
Next visit is for sacroiliac injection, we will do under fluoroscopic guidance We'll check urine drug screen in the meantime Overall goal is to get below 120 mg of morphine per day as well as to get off ValiumSacroiliac Joint Dysfunction The sacroiliac joint connects the lower part of the spine (the sacrum) with the bones of the pelvis. CAUSES  Sometimes, there is no obvious reason for sacroiliac joint dysfunction. Other times, it may occur   During pregnancy.  After injury, such as:  Car accidents.  Sport-related injuries.  Work-related injuries.  Due to one leg being shorter than the other.  Due to other conditions that affect the joints, such as:  Rheumatoid arthritis.  Gout.  Psoriasis.  Joint infection (septic arthritis). SYMPTOMS  Symptoms may include:  Pain in the:  Lower back.  Buttocks.  Groin.  Thighs and legs.  Difficult sitting, standing, walking, lying, bending or lifting. DIAGNOSIS  A number of tests may be used to help diagnose the cause of sacroiliac joint dysfunction, including:  Imaging tests to look for other causes of pain, including:  MRI.  CT scan.  Bone scan.  Diagnostic injection: During a special x-ray (called fluoroscopy), a needle is put into the sacroiliac joint. A numbing medicine is injected into the joint. If the pain is improved or stopped, the diagnosis of sacroiliac joint dysfunction is more likely. TREATMENT  There are a number of types of treatment used for sacroiliac joint dysfunction, including:  Only take over-the-counter or prescription medicines for pain, discomfort, or fever as directed by your caregiver.  Medications to relax muscles.  Rest. Decreasing activity can help cut down on painful muscle spasms and allow the back to heal.  Application of heat or ice to the lower back may improve muscle spasms and soothe pain.  Brace. A special back brace, called a sacroiliac belt, can help support the joint while your  back is healing.  Physical therapy can help teach comfortable positions and exercises to strengthen muscles that support the sacroiliac joint.  Cortisone injections. Injections of steroid medicine into the joint can help decrease swelling and improve pain.  Hyaluronic acid injections. This chemical improves lubrication within the sacroiliac joint, thereby decreasing pain.  Radiofrequency ablation. A special needle is placed into the joint, where it burns away nerves that are carrying pain messages from the joint.  Surgery. Because pain occurs during movement of the joint, screws and plates may be installed in order to limit or prevent joint motion. HOME CARE INSTRUCTIONS   Take all medications exactly as directed.  Follow instructions regarding both rest and physical activity, to avoid worsening the pain.  Do physical therapy exercises exactly as prescribed. SEEK IMMEDIATE MEDICAL CARE IF:  You experience increasingly severe pain.  You develop new symptoms, such as numbness or tingling in your legs or feet.  You lose bladder or bowel control. Document Released: 08/07/2008 Document Revised: 08/03/2011 Document Reviewed: 08/07/2008 [facility] Patient Information 2015 [place], [place]. This information is not intended to replace advice given to you by your health care provider. Make sure you discuss any questions you have with your health care provider.

## 2014-04-06 LAB — PMP ALCOHOL METABOLITE (ETG): Ethyl Glucuronide (EtG): NEGATIVE ng/mL

## 2014-04-08 LAB — OPIATES/OPIOIDS (LC/MS-MS)
Codeine Urine: NEGATIVE ng/mL (ref <50)
Hydrocodone: NEGATIVE ng/mL (ref <50)
Hydromorphone: 76 ng/mL (ref <50)
Morphine Urine: 12479 ng/mL (ref <50)
Norhydrocodone, Ur: NEGATIVE ng/mL (ref <50)
Noroxycodone, Ur: 2390 ng/mL (ref <50)
Oxycodone, ur: 1482 ng/mL (ref <50)
Oxymorphone: 72 ng/mL (ref <50)

## 2014-04-08 LAB — OXYCODONE, URINE (LC/MS-MS)
Noroxycodone, Ur: 2390 ng/mL (ref <50)
Oxycodone, ur: 1482 ng/mL (ref <50)
Oxymorphone: 72 ng/mL (ref <50)

## 2014-04-10 LAB — PRESCRIPTION MONITORING PROFILE (SOLSTAS)
Amphetamine/Meth: NEGATIVE ng/mL
Barbiturate Screen, Urine: NEGATIVE ng/mL
Benzodiazepine Screen, Urine: NEGATIVE ng/mL
Buprenorphine, Urine: NEGATIVE ng/mL
Cannabinoid Scrn, Ur: NEGATIVE ng/mL
Carisoprodol, Urine: NEGATIVE ng/mL
Cocaine Metabolites: NEGATIVE ng/mL
Creatinine, Urine: 89.88 mg/dL (ref >20.0)
Fentanyl, Ur: NEGATIVE ng/mL
MDMA URINE: NEGATIVE ng/mL
Meperidine, Ur: NEGATIVE ng/mL
Methadone Screen, Urine: NEGATIVE ng/mL
Nitrites, Initial: NEGATIVE ug/mL
Propoxyphene: NEGATIVE ng/mL
Tapentadol, urine: NEGATIVE ng/mL
Tramadol Scrn, Ur: NEGATIVE ng/mL
Zolpidem, Urine: NEGATIVE ng/mL
pH, Initial: 5.3 pH (ref 4.5–8.9)

## 2014-04-18 ENCOUNTER — Ambulatory Visit
Admission: RE | Admit: 2014-04-18 | Discharge: 2014-04-18 | Disposition: A | Discharge status: Home / Self Care | Payer: Medicare Other | Source: Ambulatory Visit | Attending: Neurosurgery | Admitting: Neurosurgery

## 2014-04-18 DIAGNOSIS — M545 Low back pain, unspecified: Secondary | ICD-10-CM

## 2014-04-18 MED ORDER — GADOBENATE DIMEGLUMINE 529 MG/ML IV SOLN
20.0000 mL | Freq: Once | INTRAVENOUS | Status: AC | PRN
  Administered 2014-04-18: 20 mL via INTRAVENOUS

## 2014-04-24 ENCOUNTER — Encounter: Payer: Medicare Other | Attending: Physical Medicine & Rehabilitation

## 2014-04-24 ENCOUNTER — Ambulatory Visit (HOSPITAL_BASED_OUTPATIENT_CLINIC_OR_DEPARTMENT_OTHER): Payer: Federal, State, Local not specified - PPO | Admitting: Physical Medicine & Rehabilitation

## 2014-04-24 ENCOUNTER — Encounter: Payer: Self-pay | Admitting: Physical Medicine & Rehabilitation

## 2014-04-24 VITALS — BP 128/80 | HR 64 | Resp 14 | Ht 71.0 in | Wt 331.4 lb

## 2014-04-24 DIAGNOSIS — M79604 Pain in right leg: Secondary | ICD-10-CM | POA: Diagnosis not present

## 2014-04-24 DIAGNOSIS — M545 Low back pain: Secondary | ICD-10-CM | POA: Diagnosis not present

## 2014-04-24 DIAGNOSIS — M961 Postlaminectomy syndrome, not elsewhere classified: Secondary | ICD-10-CM | POA: Insufficient documentation

## 2014-04-24 DIAGNOSIS — G8928 Other chronic postprocedural pain: Secondary | ICD-10-CM | POA: Diagnosis not present

## 2014-04-24 DIAGNOSIS — M79605 Pain in left leg: Secondary | ICD-10-CM | POA: Insufficient documentation

## 2014-04-24 DIAGNOSIS — Z981 Arthrodesis status: Secondary | ICD-10-CM | POA: Insufficient documentation

## 2014-04-24 DIAGNOSIS — M533 Sacrococcygeal disorders, not elsewhere classified: Secondary | ICD-10-CM

## 2014-04-24 MED ORDER — MORPHINE SULFATE ER BEADS 30 MG PO CP24: 30.0000 mg | ORAL_CAPSULE | Freq: Two times a day (BID) | ORAL | Status: DC

## 2014-04-24 MED ORDER — OXYCODONE-ACETAMINOPHEN 10-325 MG PO TABS: 1.0000 | ORAL_TABLET | Freq: Three times a day (TID) | ORAL | Status: DC | PRN

## 2014-04-24 NOTE — Patient Instructions (Signed)
Sacroiliac injection was performed today. A combination of a naming medicine plus a cortisone medicine was injected. The injection was done under x-ray guidance. This procedure has been performed to help reduce low back and buttocks pain as well as potentially hip pain. The duration of this injection is variable lasting from hours to  Months. It may repeated if needed. 

## 2014-04-24 NOTE — Progress Notes (Signed)
  [facility]   Name: Samuel [name] DOB:1975/07/20 MRN: [id number]  Date:04/24/2014  Physician: Alysia Penna, MD    Nurse/CMA: Cloria Ciresi  Allergies: No Known Allergies  Consent Signed: Yes.    Is patient diabetic? No.  CBG today? .  Pregnant: No. LMP: No LMP for male patient. (age 40-55)  Anticoagulants: no Anti-inflammatory: no Antibiotics: no  Procedure: Right Sacroiliac Epidural Steroid Injection  Position: Prone Start Time: 3:47pm  End Time: 3:51  Fluoro Time:   8  RN/CMA Dearius Hoffmann MBG    Time 3:32pm 3:50    BP 128/80 136/70    Pulse 64 66    Respirations 14 14    O2 Sat 99 99    S/S 6 6    Pain Level 8/10 3/10     D/C home with wife, [name] & O X 3, D/C instructions reviewed, and sits independently.

## 2014-04-24 NOTE — Progress Notes (Signed)
Right sacroiliac injection under fluoroscopic guidance  Indication: Right Low back and buttocks pain not relieved by medication management and other conservative care.  Informed consent was obtained after describing risks and benefits of the procedure with the patient, this includes bleeding, bruising, infection, paralysis and medication side effects. The patient wishes to proceed and has given written consent. The patient was placed in a prone position. The lumbar and sacral area was marked and prepped with Betadine. A 25-gauge 1-1/2 inch needle was inserted into the skin and subcutaneous tissue and 1 mL of 1% lidocaine was injected. Then a 25-gauge 3 inch spinal needle was inserted under fluoroscopic guidance into the Right sacroiliac joint. AP and lateral images were utilized. Omnipaque 180x0.5 mL under live fluoroscopy demonstrated no intravascular uptake. Then a solution containing one ML of 6 mgper ml betamethasone and 2 ML of 1% lidocaine MPF was injected x1.5 mL. Patient tolerated the procedure well. Post procedure instructions were given. Please see post procedure form.  Urine toxicology reviewed Continue morphine sulfate extended release 30 mg twice a day Reduce oxycodone to 10 mg 3 times per day, #90, may be able to reduce further to twice a day next month if doing okay after injection  Preinjection 8/10 Postinjection 3/10

## 2014-05-23 ENCOUNTER — Ambulatory Visit: Payer: Federal, State, Local not specified - PPO | Admitting: Registered Nurse

## 2014-05-30 ENCOUNTER — Encounter: Payer: Self-pay | Admitting: Registered Nurse

## 2014-05-30 ENCOUNTER — Encounter: Payer: Medicare Other | Attending: Registered Nurse | Admitting: Registered Nurse

## 2014-05-30 VITALS — BP 147/68 | HR 70 | Resp 14

## 2014-05-30 DIAGNOSIS — M545 Low back pain: Secondary | ICD-10-CM | POA: Diagnosis not present

## 2014-05-30 DIAGNOSIS — G8928 Other chronic postprocedural pain: Secondary | ICD-10-CM | POA: Insufficient documentation

## 2014-05-30 DIAGNOSIS — M79604 Pain in right leg: Secondary | ICD-10-CM | POA: Insufficient documentation

## 2014-05-30 DIAGNOSIS — M79605 Pain in left leg: Secondary | ICD-10-CM | POA: Diagnosis not present

## 2014-05-30 DIAGNOSIS — Z981 Arthrodesis status: Secondary | ICD-10-CM | POA: Insufficient documentation

## 2014-05-30 DIAGNOSIS — M961 Postlaminectomy syndrome, not elsewhere classified: Secondary | ICD-10-CM | POA: Insufficient documentation

## 2014-05-30 DIAGNOSIS — M533 Sacrococcygeal disorders, not elsewhere classified: Secondary | ICD-10-CM

## 2014-05-30 DIAGNOSIS — G8929 Other chronic pain: Secondary | ICD-10-CM | POA: Diagnosis not present

## 2014-05-30 DIAGNOSIS — M5416 Radiculopathy, lumbar region: Secondary | ICD-10-CM | POA: Diagnosis not present

## 2014-05-30 MED ORDER — GABAPENTIN 300 MG PO CAPS: 600.0000 mg | ORAL_CAPSULE | Freq: Every day | ORAL | Status: DC

## 2014-05-30 MED ORDER — OXYCODONE-ACETAMINOPHEN 10-325 MG PO TABS: 1.0000 | ORAL_TABLET | Freq: Three times a day (TID) | ORAL | Status: DC | PRN

## 2014-05-30 MED ORDER — MORPHINE SULFATE ER BEADS 30 MG PO CP24: 30.0000 mg | ORAL_CAPSULE | Freq: Two times a day (BID) | ORAL | Status: DC

## 2014-05-30 NOTE — Patient Instructions (Signed)
Resume Motrin for a week  If pain in leg persists Start taking Gabapentin on 06/06/2014  If you're experiencng  increase intensity of pain please call our office.  [phone]

## 2014-05-30 NOTE — Progress Notes (Signed)
Subjective:    Patient ID: Samuel [name], male    DOB: [birth year], 39 y.o.   MRN: [id number]  HPI: [name] is a 39 year old male who returns for follow up for chronic pain and medication refill. He says his pain is located in his lower back radiating into his lower extremities. He rates his pain 7. His current exercise regime is performing stretching exercises and walking short distances. Wife in room. Surgical history: In 2010 had a L3-L4 fusion by Dr. [name]. In January 2015 underwent L3-S1 fusion by Dr. [name]. S/P Sacroiliac Epidural Injection had relief for 3-4 days. He has a follow up appointment with Dr. [name] on 06/29/14 regarding his MRI.   Pain Inventory Average Pain 7 Pain Right Now 7 My pain is sharp, dull, stabbing and aching  In the last 24 hours, has pain interfered with the following? General activity 9 Relation with others 9 Enjoyment of life 9 What TIME of day is your pain at its worst? morning, evening, night Sleep (in general) Fair  Pain is worse with: walking, bending, sitting and standing Pain improves with: rest and medication Relief from Meds: 3  Mobility walk with assistance how many minutes can you walk? 5 ability to climb steps?  yes do you drive?  yes  Function disabled: date disabled .  Neuro/Psych weakness spasms  Prior Studies Any changes since last visit?  no  Physicians involved in your care Any changes since last visit?  no   History reviewed. No pertinent family history. History   Social History  . Marital Status: Married    Spouse Name: N/A    Number of Children: N/A  . Years of Education: N/A   Social History Main Topics  . Smoking status: Never Smoker   . Smokeless tobacco: None  . Alcohol Use: Yes     Comment: very rare (not since son was born)  . Drug Use: No  . Sexual Activity: None   Other Topics Concern  . None   Social History Narrative   Past Surgical History  Procedure Laterality Date  .  Back surgery      lower back surgery x 2   Past Medical History  Diagnosis Date  . GERD (gastroesophageal reflux disease)   . Headache(784.0)   . Arthritis     "some in my back"   BP 147/68 mmHg  Pulse 70  Resp 14  SpO2 96%  Opioid Risk Score:   Fall Risk Score: Low Fall Risk (0-5 points) (pt rec'd fall safety pamphlet during initial visit) Review of Systems  Neurological: Positive for weakness.       Spasms  All other systems reviewed and are negative.      Objective:   Physical Exam  Constitutional: He is oriented to person, place, and time. He appears well-developed and well-nourished.  HENT:  Head: Normocephalic and atraumatic.  Neck: Normal range of motion. Neck supple.  Cardiovascular: Normal rate and regular rhythm.   Pulmonary/Chest: Effort normal and breath sounds normal.  Musculoskeletal:  Normal Muscle Bulk and Muscle testing Reveals: Upper Extremities: Full ROM and Muscle strength 5/5 Lumbar Paraspinal Tenderness: L-3- L-5 Lower Extremities: Full ROM and Muscle strength 5/5 Right Lower extremity Flexion Produces pain into Lumbar Arises from chair with slight difficulty Antalgic gait  Neurological: He is alert and oriented to person, place, and time.  Skin: Skin is warm and dry.  Psychiatric: He has a normal mood and affect.  Nursing note  and vitals reviewed.         Assessment & Plan:  1. 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 He does have lumbar degenerative disc disease : Refilled: Morphine 30 mg one capsule twice a day #60. Resume Motrin.  2. Sacroiliac Dysfunction: S/P Right Sacroiliac Epidural Steroid Injection: Relief 3-4 days. Continue with current medication, heat and exercise regime   30 minutes of face to face patient care time was spent during this visit. All questions were encouraged and answered.  F/U in 1 month.

## 2014-06-29 DIAGNOSIS — Z6841 Body Mass Index (BMI) 40.0 and over, adult: Secondary | ICD-10-CM | POA: Diagnosis not present

## 2014-06-29 DIAGNOSIS — M545 Low back pain: Secondary | ICD-10-CM | POA: Diagnosis not present

## 2014-07-02 ENCOUNTER — Ambulatory Visit (HOSPITAL_BASED_OUTPATIENT_CLINIC_OR_DEPARTMENT_OTHER): Payer: Federal, State, Local not specified - PPO | Admitting: Physical Medicine & Rehabilitation

## 2014-07-02 ENCOUNTER — Encounter: Payer: Self-pay | Admitting: Physical Medicine & Rehabilitation

## 2014-07-02 ENCOUNTER — Encounter: Payer: Medicare Other | Attending: Physical Medicine & Rehabilitation

## 2014-07-02 VITALS — BP 126/59 | HR 50 | Resp 14

## 2014-07-02 DIAGNOSIS — M961 Postlaminectomy syndrome, not elsewhere classified: Secondary | ICD-10-CM | POA: Diagnosis not present

## 2014-07-02 DIAGNOSIS — M5416 Radiculopathy, lumbar region: Secondary | ICD-10-CM

## 2014-07-02 DIAGNOSIS — M79605 Pain in left leg: Secondary | ICD-10-CM | POA: Insufficient documentation

## 2014-07-02 DIAGNOSIS — G8929 Other chronic pain: Secondary | ICD-10-CM

## 2014-07-02 DIAGNOSIS — M545 Low back pain: Secondary | ICD-10-CM | POA: Insufficient documentation

## 2014-07-02 DIAGNOSIS — Z981 Arthrodesis status: Secondary | ICD-10-CM | POA: Insufficient documentation

## 2014-07-02 DIAGNOSIS — G8928 Other chronic postprocedural pain: Secondary | ICD-10-CM | POA: Insufficient documentation

## 2014-07-02 DIAGNOSIS — M79604 Pain in right leg: Secondary | ICD-10-CM | POA: Diagnosis not present

## 2014-07-02 DIAGNOSIS — M533 Sacrococcygeal disorders, not elsewhere classified: Secondary | ICD-10-CM

## 2014-07-02 MED ORDER — OXYCODONE-ACETAMINOPHEN 10-325 MG PO TABS: 1.0000 | ORAL_TABLET | Freq: Four times a day (QID) | ORAL | Status: DC | PRN

## 2014-07-02 MED ORDER — MORPHINE SULFATE ER BEADS 30 MG PO CP24: 30.0000 mg | ORAL_CAPSULE | Freq: Two times a day (BID) | ORAL | Status: DC

## 2014-07-02 NOTE — Patient Instructions (Signed)
Will repeat sacroiliac injection next month We discussed radiofrequency procedure as well as a potential treatment option   We'll keep the morphine dosage the same Increase oxycodone to 4 tablets per day  Continue gabapentin the same  Discontinue evening dose of ibuprofen due to heartburn

## 2014-07-02 NOTE — Progress Notes (Signed)
Subjective:    Patient ID: Samuel [name], male    DOB: [birth year], 39 y.o.   MRN: [id number]  HPI Right sacroiliac injection 04/24/2014 had greater than 50% relief for approximate one-week Patient without significant left sided low back pain. Patient has intermittent burning pain shooting down the legs. This is somewhat worse at night.    Pain Inventory Average Pain 7 Pain Right Now 6 My pain is constant, sharp, stabbing and aching  In the last 24 hours, has pain interfered with the following? General activity 8 Relation with others 8 Enjoyment of life 10 What TIME of day is your pain at its worst? morning,evening Sleep (in general) Fair  Pain is worse with: walking, bending, sitting and standing Pain improves with: rest and medication Relief from Meds: 5  Mobility do you drive?  yes Do you have any goals in this area?  no  Function disabled: date disabled . I need assistance with the following:  bathing and household duties Do you have any goals in this area?  no  Neuro/Psych weakness trouble walking  Prior Studies Any changes since last visit?  no  Physicians involved in your care Any changes since last visit?  no Review of Systems  Gastrointestinal: Positive for constipation.  Musculoskeletal: Positive for gait problem.  Neurological: Positive for weakness.  All other systems reviewed and are negative.      Objective:   Physical Exam  Constitutional: He is oriented to person, place, and time. He appears well-developed.  obese  HENT:  Head: Normocephalic and atraumatic.  Eyes: Conjunctivae and EOM are normal. Pupils are equal, round, and reactive to light.  Neurological: He is alert and oriented to person, place, and time. He has normal reflexes.  Psychiatric: He has a normal mood and affect.  Nursing note and vitals reviewed.   Tenderness to palpationRight PSIS area 5/5 strength bilateral hip flexor knee extensor ankle dorsiflexor Negative  straight leg raising test Transfers are slow patient very stiff getting up but once he is up he can get to neutral. Tends to be forward flexed at the hips.      Assessment & Plan:  1. Lumbar post laminectomy syndrome with chronic postoperative pain he is one year post fusion, L3-S1. Patient has right axial pain with temporary relief following sacroiliac injection In addition patient has radicular type discomfort bilateral lower extremities which is more intermittent.  We will repeat sacroiliac injection Discussed possibility of sacroiliac radiofrequency procedures  Continue Avinza 30 g twice a day Increase Percocet 10 mg every 6 rather than every 8  Gabapentin 100 mg 3 times a day, may need to titrate upward from there  History reviewed. No pertinent family history. History   Social History  . Marital Status: Married    Spouse Name: N/A    Number of Children: N/A  . Years of Education: N/A   Social History Main Topics  . Smoking status: Never Smoker   . Smokeless tobacco: None  . Alcohol Use: Yes     Comment: very rare (not since son was born)  . Drug Use: No  . Sexual Activity: None   Other Topics Concern  . None   Social History Narrative   Past Surgical History  Procedure Laterality Date  . Back surgery      lower back surgery x 2   Past Medical History  Diagnosis Date  . GERD (gastroesophageal reflux disease)   . Headache(784.0)   . Arthritis     "  some in my back"   BP 126/59 mmHg  Pulse 50  Resp 14  SpO2 96%  Opioid Risk Score:   Fall Risk Score: Low Fall Risk (0-5 points)

## 2014-07-17 ENCOUNTER — Ambulatory Visit (HOSPITAL_BASED_OUTPATIENT_CLINIC_OR_DEPARTMENT_OTHER): Payer: Federal, State, Local not specified - PPO | Admitting: Physical Medicine & Rehabilitation

## 2014-07-17 ENCOUNTER — Encounter: Payer: Self-pay | Admitting: Physical Medicine & Rehabilitation

## 2014-07-17 ENCOUNTER — Other Ambulatory Visit: Payer: Self-pay | Admitting: Physical Medicine & Rehabilitation

## 2014-07-17 VITALS — BP 126/90 | HR 76 | Resp 14

## 2014-07-17 DIAGNOSIS — M533 Sacrococcygeal disorders, not elsewhere classified: Secondary | ICD-10-CM

## 2014-07-17 DIAGNOSIS — M545 Low back pain: Secondary | ICD-10-CM | POA: Diagnosis not present

## 2014-07-17 DIAGNOSIS — Z79899 Other long term (current) drug therapy: Secondary | ICD-10-CM

## 2014-07-17 DIAGNOSIS — Z5181 Encounter for therapeutic drug level monitoring: Secondary | ICD-10-CM

## 2014-07-17 DIAGNOSIS — M961 Postlaminectomy syndrome, not elsewhere classified: Secondary | ICD-10-CM | POA: Diagnosis not present

## 2014-07-17 DIAGNOSIS — Z981 Arthrodesis status: Secondary | ICD-10-CM | POA: Diagnosis not present

## 2014-07-17 DIAGNOSIS — M79604 Pain in right leg: Secondary | ICD-10-CM | POA: Diagnosis not present

## 2014-07-17 DIAGNOSIS — G8928 Other chronic postprocedural pain: Secondary | ICD-10-CM | POA: Diagnosis not present

## 2014-07-17 DIAGNOSIS — M79605 Pain in left leg: Secondary | ICD-10-CM | POA: Diagnosis not present

## 2014-07-17 MED ORDER — MORPHINE SULFATE ER BEADS 30 MG PO CP24: 30.0000 mg | ORAL_CAPSULE | Freq: Two times a day (BID) | ORAL | Status: DC

## 2014-07-17 MED ORDER — OXYCODONE-ACETAMINOPHEN 10-325 MG PO TABS: 1.0000 | ORAL_TABLET | Freq: Four times a day (QID) | ORAL | Status: DC | PRN

## 2014-07-17 NOTE — Progress Notes (Signed)
Right sacroiliac injection under fluoroscopic guidance  Indication: Right Low back and buttocks pain not relieved by medication management and other conservative care.  Informed consent was obtained after describing risks and benefits of the procedure with the patient, this includes bleeding, bruising, infection, paralysis and medication side effects. The patient wishes to proceed and has given written consent. The patient was placed in a prone position. The lumbar and sacral area was marked and prepped with Betadine. A 25-gauge 1-1/2 inch needle was inserted into the skin and subcutaneous tissue and 1 mL of 1% lidocaine was injected. Then a 25-gauge 3 inch spinal needle was inserted under fluoroscopic guidance into the Right sacroiliac joint. AP and lateral images were utilized. Omnipaque 180x0.5 mL under live fluoroscopy demonstrated no intravascular uptake. Then a solution containing one ML of 6 mgper ml betamethasone and 2 ML of 1% lidocaine MPF was injected x1.5 mL. Patient tolerated the procedure well. Post procedure instructions were given. Please see post procedure form.    Preinjection 6/10 Postinjection 3/10  We'll schedule him for right L4- medial branch and right L5 dorsal ramus radiofrequency as well as R S1 S2 S3 neurolysis

## 2014-07-17 NOTE — Progress Notes (Signed)
  [facility]   Name: Samuel [name] DOB:Sep 24, 1975 MRN: [id number]  Date:07/17/2014  Physician: Alysia Penna, MD    Nurse/CMA: Daxx Tiggs   Allergies: No Known Allergies  Consent Signed: Yes.    Is patient diabetic? No.  CBG today? .  Pregnant: No. LMP: No LMP for male patient. (age 41-55)  Anticoagulant: no Anti-inflammatory: no Antibiotics: no  Procedure: Right SI Injection  Position: Prone Start Time:  11:00 End Time: 11:08  Fluoro Time: 12  RN/CMA Maverick Patman Tiwan Schnitker    Time 10:23 11:08    BP 124/90 130/64    Pulse 76 62    Respirations 14 14    O2 Sat 99 99    S/S 6 6    Pain Level 6/10 3/10     D/C home with wife, [name] & O X 3, D/C instructions reviewed, and sits independently.

## 2014-07-17 NOTE — Patient Instructions (Signed)
Sacroiliac injection was performed today. A combination of a naming medicine plus a cortisone medicine was injected. The injection was done under x-ray guidance. This procedure has been performed to help reduce low back and buttocks pain as well as potentially hip pain. The duration of this injection is variable lasting from hours to  Months. It may repeated if needed. 

## 2014-07-18 LAB — PMP ALCOHOL METABOLITE (ETG): Ethyl Glucuronide (EtG): NEGATIVE ng/mL

## 2014-07-19 ENCOUNTER — Ambulatory Visit: Payer: Self-pay | Admitting: Physical Medicine & Rehabilitation

## 2014-07-19 ENCOUNTER — Ambulatory Visit: Payer: Self-pay

## 2014-07-23 LAB — OXYCODONE, URINE (LC/MS-MS)
Noroxycodone, Ur: 2804 ng/mL (ref <50)
Oxycodone, ur: 1503 ng/mL (ref <50)
Oxymorphone: 61 ng/mL (ref <50)

## 2014-07-23 LAB — OPIATES/OPIOIDS (LC/MS-MS)
Codeine Urine: NEGATIVE ng/mL (ref <50)
Hydrocodone: NEGATIVE ng/mL (ref <50)
Hydromorphone: NEGATIVE ng/mL — AB (ref <50)
Morphine Urine: 12978 ng/mL (ref <50)
Norhydrocodone, Ur: NEGATIVE ng/mL (ref <50)
Noroxycodone, Ur: 2804 ng/mL (ref <50)
Oxycodone, ur: 1503 ng/mL (ref <50)
Oxymorphone: 61 ng/mL (ref <50)

## 2014-07-24 LAB — PRESCRIPTION MONITORING PROFILE (SOLSTAS)
Amphetamine/Meth: NEGATIVE ng/mL
Barbiturate Screen, Urine: NEGATIVE ng/mL
Benzodiazepine Screen, Urine: NEGATIVE ng/mL
Buprenorphine, Urine: NEGATIVE ng/mL
Cannabinoid Scrn, Ur: NEGATIVE ng/mL
Carisoprodol, Urine: NEGATIVE ng/mL
Cocaine Metabolites: NEGATIVE ng/mL
Creatinine, Urine: 63.97 mg/dL (ref >20.0)
Fentanyl, Ur: NEGATIVE ng/mL
MDMA URINE: NEGATIVE ng/mL
Meperidine, Ur: NEGATIVE ng/mL
Methadone Screen, Urine: NEGATIVE ng/mL
Nitrites, Initial: NEGATIVE ug/mL
Propoxyphene: NEGATIVE ng/mL
Tapentadol, urine: NEGATIVE ng/mL
Tramadol Scrn, Ur: NEGATIVE ng/mL
Zolpidem, Urine: NEGATIVE ng/mL
pH, Initial: 5 pH (ref 4.5–8.9)

## 2014-07-27 NOTE — Progress Notes (Signed)
Urine drug screen for this encounter is consistent for prescribed medication 

## 2014-08-16 ENCOUNTER — Encounter: Payer: Federal, State, Local not specified - PPO | Attending: Physical Medicine & Rehabilitation

## 2014-08-16 ENCOUNTER — Encounter: Payer: Self-pay | Admitting: Physical Medicine & Rehabilitation

## 2014-08-16 ENCOUNTER — Ambulatory Visit (HOSPITAL_BASED_OUTPATIENT_CLINIC_OR_DEPARTMENT_OTHER): Payer: Federal, State, Local not specified - PPO | Admitting: Physical Medicine & Rehabilitation

## 2014-08-16 VITALS — BP 128/67 | HR 62 | Resp 14

## 2014-08-16 DIAGNOSIS — M961 Postlaminectomy syndrome, not elsewhere classified: Secondary | ICD-10-CM | POA: Diagnosis not present

## 2014-08-16 DIAGNOSIS — M533 Sacrococcygeal disorders, not elsewhere classified: Secondary | ICD-10-CM

## 2014-08-16 DIAGNOSIS — Z981 Arthrodesis status: Secondary | ICD-10-CM | POA: Diagnosis not present

## 2014-08-16 DIAGNOSIS — M79604 Pain in right leg: Secondary | ICD-10-CM | POA: Insufficient documentation

## 2014-08-16 DIAGNOSIS — M79605 Pain in left leg: Secondary | ICD-10-CM | POA: Diagnosis not present

## 2014-08-16 DIAGNOSIS — G8928 Other chronic postprocedural pain: Secondary | ICD-10-CM | POA: Insufficient documentation

## 2014-08-16 DIAGNOSIS — M545 Low back pain: Secondary | ICD-10-CM | POA: Diagnosis not present

## 2014-08-16 MED ORDER — MORPHINE SULFATE ER BEADS 30 MG PO CP24: 30.0000 mg | ORAL_CAPSULE | Freq: Two times a day (BID) | ORAL | Status: DC

## 2014-08-16 MED ORDER — OXYCODONE-ACETAMINOPHEN 10-325 MG PO TABS: 1.0000 | ORAL_TABLET | Freq: Four times a day (QID) | ORAL | Status: DC | PRN

## 2014-08-16 NOTE — Progress Notes (Signed)
Attempted radiofrequency of the sacroiliac. The indication was for sacroiliac distribution pain that was relieved on 2 occasions by greater than 50% with sacroiliac intra-articular injections under fluoroscopic guidance. Patient was placed in a prone position Betadine prep sterile drape. Skin and subcutaneous tissues were anesthetized with lidocaine 1% 1 cc at each 2 sites.Patient complained of pain going down his leg during infiltration of the skin and subcutaneous tissue with 1.5 inch needle. Patient with a large body habitus. Then the needle was advanced to the right S1 SAP and sacral alar junction. Bone contact was made and confirmed with lateral imaging sensory stim at 50 Hz for motor stim at 2 Hz confirmed proper needle location followed by injection of 1 cc of a solution containing 1 cc of 4 mg/cc dexamethasone and 4 cc 1% lidocaine., Then the L5 SAP transverse process junction was targeted bone contact made confirmed with lateral imaging, sensory stim at 50 Hz followed by motor stim at 2 Hz confirm proper needle location followed by injection of 1 cc of the dexamethasone lidocaine solution. Radiofrequency was attempted at [facility] but after 10 seconds patient stated he could not tolerate and therefore it was stopped. Post procedure instructions. Follow-up in 6 weeks. Postprocedure vital stable, patient was able to ambulate without assistance. Discussed other treatment options including trying a repeat procedure after Valium. Also discussed other treatment options including sacroiliac fusion but this would need to be discussed with his neurosurgeon. Reviewed medications, continue current doses. He has about a 6 week supply. No signs of opioid abuse See procedure record

## 2014-08-16 NOTE — Patient Instructions (Addendum)
Could not perform Radiofrequency procedure today, unable to tolerate heating of needle  We could potentially attempt this after valium,  Other treatment options include sacroiliac fusion but you would need to discus with Dr [name]

## 2014-08-16 NOTE — Progress Notes (Signed)
  [facility]   Name: SCOTT VANDERVEER DOB:1975-10-29 MRN: [id number]  Date:08/16/2014  Physician: Alysia Penna, MD    Nurse/CMA: [name]  Allergies: No Known Allergies  Consent Signed: Yes.    Is patient diabetic? No.  CBG today?   Pregnant: No. LMP: No LMP for male patient. (age 41-55)  Anticoagulants: no Anti-inflammatory: no Antibiotics: no  Procedure: radiofrequency neurotomy Position: Prone Start Time: 10:48am  End Time: 11:05am  Fluoro Time: 66  RN/CMA Rolan Bucco Floy Riegler    Time 10:25am 11:10 am    BP 128/67 142/74    Pulse 62 61    Respirations 14 14    O2 Sat 96 99    S/S 6 6    Pain Level 7/10 7/10     D/C home with wife, [name] & O X 3, D/C instructions reviewed, and sits independently.

## 2014-09-06 DIAGNOSIS — J0100 Acute maxillary sinusitis, unspecified: Secondary | ICD-10-CM | POA: Diagnosis not present

## 2014-09-06 DIAGNOSIS — J209 Acute bronchitis, unspecified: Secondary | ICD-10-CM | POA: Diagnosis not present

## 2014-09-06 DIAGNOSIS — J029 Acute pharyngitis, unspecified: Secondary | ICD-10-CM | POA: Diagnosis not present

## 2014-09-27 ENCOUNTER — Encounter: Payer: Federal, State, Local not specified - PPO | Attending: Physical Medicine & Rehabilitation

## 2014-09-27 ENCOUNTER — Ambulatory Visit (HOSPITAL_BASED_OUTPATIENT_CLINIC_OR_DEPARTMENT_OTHER): Payer: Federal, State, Local not specified - PPO | Admitting: Physical Medicine & Rehabilitation

## 2014-09-27 ENCOUNTER — Encounter: Payer: Self-pay | Admitting: Physical Medicine & Rehabilitation

## 2014-09-27 ENCOUNTER — Other Ambulatory Visit: Payer: Self-pay | Admitting: Physical Medicine & Rehabilitation

## 2014-09-27 VITALS — BP 124/65 | HR 60 | Resp 16

## 2014-09-27 DIAGNOSIS — M961 Postlaminectomy syndrome, not elsewhere classified: Secondary | ICD-10-CM

## 2014-09-27 DIAGNOSIS — M79604 Pain in right leg: Secondary | ICD-10-CM | POA: Insufficient documentation

## 2014-09-27 DIAGNOSIS — Z5181 Encounter for therapeutic drug level monitoring: Secondary | ICD-10-CM | POA: Diagnosis not present

## 2014-09-27 DIAGNOSIS — M79605 Pain in left leg: Secondary | ICD-10-CM | POA: Diagnosis not present

## 2014-09-27 DIAGNOSIS — Z981 Arthrodesis status: Secondary | ICD-10-CM | POA: Diagnosis not present

## 2014-09-27 DIAGNOSIS — Z79899 Other long term (current) drug therapy: Secondary | ICD-10-CM

## 2014-09-27 DIAGNOSIS — M533 Sacrococcygeal disorders, not elsewhere classified: Secondary | ICD-10-CM

## 2014-09-27 DIAGNOSIS — M545 Low back pain: Secondary | ICD-10-CM | POA: Insufficient documentation

## 2014-09-27 DIAGNOSIS — G8928 Other chronic postprocedural pain: Secondary | ICD-10-CM | POA: Diagnosis not present

## 2014-09-27 MED ORDER — IBUPROFEN 800 MG PO TABS: 800.0000 mg | ORAL_TABLET | Freq: Three times a day (TID) | ORAL | Status: DC | PRN

## 2014-09-27 MED ORDER — MORPHINE SULFATE ER BEADS 30 MG PO CP24: 30.0000 mg | ORAL_CAPSULE | Freq: Two times a day (BID) | ORAL | Status: DC

## 2014-09-27 MED ORDER — GABAPENTIN 300 MG PO CAPS: 600.0000 mg | ORAL_CAPSULE | Freq: Every day | ORAL | Status: DC

## 2014-09-27 MED ORDER — MORPHINE SULFATE ER 30 MG PO TBCR: 30.0000 mg | EXTENDED_RELEASE_TABLET | Freq: Two times a day (BID) | ORAL | Status: DC

## 2014-09-27 MED ORDER — OXYCODONE-ACETAMINOPHEN 10-325 MG PO TABS: 1.0000 | ORAL_TABLET | Freq: Four times a day (QID) | ORAL | Status: DC | PRN

## 2014-09-27 NOTE — Patient Instructions (Signed)
Hamstring stretches:

## 2014-09-27 NOTE — Progress Notes (Signed)
Subjective:    Patient ID: Samuel [name], male    DOB: 05-21-1976, 39 y.o.   MRN: [id number]  HPI Having problems with Avinza (Morphine Sulfate ER) being expensive.  Wondering about an alternative.  (perhaps if not ordered in capsul form but tablet might be cheaper?  Attempted radiofrequency of sacroiliac joint, patient did not tolerate.  No new issues over the last 5-6 weeks.  Continues have severe pain. Does not do much in terms of stretching. We discussed the importance of hamstring stretching  Pain Inventory Average Pain 7 Pain Right Now 6 My pain is sharp, dull, stabbing and aching  In the last 24 hours, has pain interfered with the following? General activity 9 Relation with others 9 Enjoyment of life 10 What TIME of day is your pain at its worst? morning and night Sleep (in general) Fair  Pain is worse with: walking, bending, sitting, standing and some activites Pain improves with: rest, heat/ice and medication Relief from Meds: 5  Mobility walk without assistance how many minutes can you walk? 5 ability to climb steps?  yes do you drive?  yes  Function disabled: date disabled 2013 I need assistance with the following:  meal prep and household duties  Neuro/Psych weakness trouble walking spasms depression  Prior Studies Any changes since last visit?  no  Physicians involved in your care Any changes since last visit?  no   No family history on file. History   Social History  . Marital Status: Married    Spouse Name: N/A  . Number of Children: N/A  . Years of Education: N/A   Social History Main Topics  . Smoking status: Never Smoker   . Smokeless tobacco: Not on file  . Alcohol Use: Yes     Comment: very rare (not since son was born)  . Drug Use: No  . Sexual Activity: Not on file   Other Topics Concern  . None   Social History Narrative   Past Surgical History  Procedure Laterality Date  . Back surgery      lower back surgery x 2    Past Medical History  Diagnosis Date  . GERD (gastroesophageal reflux disease)   . Headache(784.0)   . Arthritis     "some in my back"   BP 124/65 mmHg  Pulse 60  Resp 16  SpO2 100%  Opioid Risk Score:   Fall Risk Score: Low Fall Risk (0-5 points) (has been educated and given handout)`1  Depression screen PHQ 2/9  No flowsheet data found.  Review of Systems  Gastrointestinal: Positive for constipation.  Musculoskeletal: Positive for gait problem.       Spasms  Neurological: Positive for weakness.  Psychiatric/Behavioral: Positive for dysphoric mood.  All other systems reviewed and are negative.      Objective:   Physical Exam  Constitutional: He is oriented to person, place, and time. He appears well-developed.  overweight  HENT:  Head: Normocephalic and atraumatic.  Eyes: Conjunctivae and EOM are normal. Pupils are equal, round, and reactive to light.  Neck: Normal range of motion.  Musculoskeletal: Normal range of motion.  Tenderness palpation lower lumbar area  Hamstring tightness with straight leg  Neurological: He is alert and oriented to person, place, and time.  Psychiatric: He has a normal mood and affect.  Nursing note and vitals reviewed.  Patient able to touch knees but not further than that. Has knees bent when bending forward       Assessment &  Plan:  1. Lumbar postlaminectomy syndrome status post lumbar fusion, has sacroiliac disorder which was relieved temporarily by sacroiliac injections however patient did not tolerate sacroiliac radiofrequency procedure and it could not be completed. Will switch Avinza to MS Contin 30 mg twice a day for cost reasons Will continue oxycodone 10 mg 4 times a day  Continue opioid monitoring program. This consists of regular clinic visits, examinations, urine drug screen, pill counts as well as use of [place] controlled substance reporting System.  Instructed patient in hamstring stretching in a lying  position with a towel or sheet around his foot pulling up with his arms, 60 seconds each leg every day  Nurse practitioner visit in 5 weeks

## 2014-09-28 LAB — PMP ALCOHOL METABOLITE (ETG): Ethyl Glucuronide (EtG): NEGATIVE ng/mL

## 2014-10-01 LAB — OXYCODONE, URINE (LC/MS-MS)
Noroxycodone, Ur: 4444 ng/mL (ref <50)
Oxycodone, ur: 2895 ng/mL (ref <50)
Oxymorphone: 54 ng/mL (ref <50)

## 2014-10-01 LAB — OPIATES/OPIOIDS (LC/MS-MS)
Codeine Urine: NEGATIVE ng/mL (ref <50)
Hydrocodone: NEGATIVE ng/mL (ref <50)
Hydromorphone: 51 ng/mL (ref <50)
Morphine Urine: 8090 ng/mL (ref <50)
Norhydrocodone, Ur: NEGATIVE ng/mL (ref <50)
Noroxycodone, Ur: 4444 ng/mL (ref <50)
Oxycodone, ur: 2895 ng/mL (ref <50)
Oxymorphone: 54 ng/mL (ref <50)

## 2014-10-02 LAB — PRESCRIPTION MONITORING PROFILE (SOLSTAS)
Amphetamine/Meth: NEGATIVE ng/mL
Barbiturate Screen, Urine: NEGATIVE ng/mL
Benzodiazepine Screen, Urine: NEGATIVE ng/mL
Buprenorphine, Urine: NEGATIVE ng/mL
Cannabinoid Scrn, Ur: NEGATIVE ng/mL
Carisoprodol, Urine: NEGATIVE ng/mL
Cocaine Metabolites: NEGATIVE ng/mL
Creatinine, Urine: 59.56 mg/dL (ref >20.0)
Fentanyl, Ur: NEGATIVE ng/mL
MDMA URINE: NEGATIVE ng/mL
Meperidine, Ur: NEGATIVE ng/mL
Methadone Screen, Urine: NEGATIVE ng/mL
Nitrites, Initial: NEGATIVE ug/mL
Propoxyphene: NEGATIVE ng/mL
Tapentadol, urine: NEGATIVE ng/mL
Tramadol Scrn, Ur: NEGATIVE ng/mL
Zolpidem, Urine: NEGATIVE ng/mL
pH, Initial: 5.3 pH (ref 4.5–8.9)

## 2014-10-10 NOTE — Progress Notes (Signed)
Urine drug screen for this encounter is consistent for prescribed medication 

## 2014-11-01 ENCOUNTER — Encounter
Payer: Federal, State, Local not specified - PPO | Attending: Physical Medicine & Rehabilitation | Admitting: Registered Nurse

## 2014-11-01 ENCOUNTER — Encounter: Payer: Self-pay | Admitting: Registered Nurse

## 2014-11-01 VITALS — BP 130/70 | HR 62 | Resp 16

## 2014-11-01 DIAGNOSIS — M79605 Pain in left leg: Secondary | ICD-10-CM | POA: Diagnosis not present

## 2014-11-01 DIAGNOSIS — Z79899 Other long term (current) drug therapy: Secondary | ICD-10-CM | POA: Diagnosis not present

## 2014-11-01 DIAGNOSIS — M961 Postlaminectomy syndrome, not elsewhere classified: Secondary | ICD-10-CM | POA: Diagnosis not present

## 2014-11-01 DIAGNOSIS — M533 Sacrococcygeal disorders, not elsewhere classified: Secondary | ICD-10-CM

## 2014-11-01 DIAGNOSIS — Z981 Arthrodesis status: Secondary | ICD-10-CM | POA: Insufficient documentation

## 2014-11-01 DIAGNOSIS — Z5181 Encounter for therapeutic drug level monitoring: Secondary | ICD-10-CM | POA: Diagnosis not present

## 2014-11-01 DIAGNOSIS — M5416 Radiculopathy, lumbar region: Secondary | ICD-10-CM

## 2014-11-01 DIAGNOSIS — M79604 Pain in right leg: Secondary | ICD-10-CM | POA: Insufficient documentation

## 2014-11-01 DIAGNOSIS — G8928 Other chronic postprocedural pain: Secondary | ICD-10-CM | POA: Diagnosis not present

## 2014-11-01 DIAGNOSIS — M545 Low back pain: Secondary | ICD-10-CM | POA: Diagnosis not present

## 2014-11-01 DIAGNOSIS — G8929 Other chronic pain: Secondary | ICD-10-CM

## 2014-11-01 MED ORDER — OXYCODONE-ACETAMINOPHEN 10-325 MG PO TABS: 1.0000 | ORAL_TABLET | Freq: Four times a day (QID) | ORAL | Status: DC | PRN

## 2014-11-01 MED ORDER — MORPHINE SULFATE ER 30 MG PO TBCR: 30.0000 mg | EXTENDED_RELEASE_TABLET | Freq: Two times a day (BID) | ORAL | Status: DC

## 2014-11-01 NOTE — Progress Notes (Signed)
Subjective:    Patient ID: Samuel [name], male    DOB: [birth year], 39 y.o.   MRN: [id number]  HPI: Mr. [name] is a 39 year old male who returns for follow up for chronic pain and medication refill. He says his pain is located in his lower back radiating into his lower extremities laterally. He rates his pain 7. His current exercise regime is performing stretching exercises and walking short distances. In March 2016  radiofrequency neurotomy was attempted  Mr. [name] wasn't able to tolerate. re In February 2016  Right S1 Injection, was attempted [name] wasn't able to tolerate these procedures per [name] note. [name] states the same.  Surgical history: In 2010 had a L3-L4 fusion by Dr. [name]. In January 2015 underwent L3-S1 fusion by Dr. [name].   Pain Inventory Average Pain 6 Pain Right Now 7 My pain is sharp, dull, stabbing and aching  In the last 24 hours, has pain interfered with the following? General activity 8 Relation with others 8 Enjoyment of life 9 What TIME of day is your pain at its worst? morning evening and night Sleep (in general) Fair  Pain is worse with: walking, bending, sitting and standing Pain improves with: rest and medication Relief from Meds: 5  Mobility walk with assistance how many minutes can you walk? 5 ability to climb steps?  no do you drive?  yes  Function disabled: date disabled 2011 I need assistance with the following:  bathing, meal prep, household duties and shopping  Neuro/Psych weakness trouble walking spasms depression  Prior Studies Any changes since last visit?  no  Physicians involved in your care Any changes since last visit?  no   History reviewed. No pertinent family history. History   Social History  . Marital Status: Married    Spouse Name: N/A  . Number of Children: N/A  . Years of Education: N/A   Social History Main Topics  . Smoking status: Never Smoker   . Smokeless tobacco: Not on  file  . Alcohol Use: Yes     Comment: very rare (not since son was born)  . Drug Use: No  . Sexual Activity: Not on file   Other Topics Concern  . None   Social History Narrative   Past Surgical History  Procedure Laterality Date  . Back surgery      lower back surgery x 2   Past Medical History  Diagnosis Date  . GERD (gastroesophageal reflux disease)   . Headache(784.0)   . Arthritis     "some in my back"   BP 130/70 mmHg  Pulse 62  Resp 16  SpO2 97%  Opioid Risk Score:   Fall Risk Score: Low Fall Risk (0-5 points)`1  Depression screen PHQ 2/9  No flowsheet data found.  Review of Systems  Gastrointestinal: Positive for constipation.  Musculoskeletal: Positive for gait problem.  Neurological: Positive for weakness.  Psychiatric/Behavioral: Positive for dysphoric mood.  All other systems reviewed and are negative.      Objective:   Physical Exam  Constitutional: He is oriented to person, place, and time. He appears well-developed and well-nourished.  HENT:  Head: Normocephalic and atraumatic.  Neck: Normal range of motion. Neck supple.  Cardiovascular: Normal rate and regular rhythm.   Pulmonary/Chest: Effort normal and breath sounds normal.  Musculoskeletal:  Normal Muscle Bulk and Muscle Testing Reveals: Upper extremities: Full ROM and Muscle strength 5/5 Lumbar Paraspinal Tenderness: L-3- L-5 Lower Extremities: Full  ROM and Muscle Strength 5/5 Right Lower Extremity Flexion Produces pain into Lumbar Left Lower Extremity Flexion Produces Pain into Hamstring Posteriorly Arises from chair slowly Antalgic gait   Neurological: He is alert and oriented to person, place, and time.  Skin: Skin is warm and dry.  Psychiatric: He has a normal mood and affect.  Nursing note and vitals reviewed.         Assessment & Plan:  1. 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 He does have lumbar degenerative disc disease : Refilled:  MS Contin  30 mg one tablet every 12 hours  #60 and Oxycodone 10/325 mg one tablet every 6 hours as needed for pain #120. Second script given to accommodate scheduled appointment. 2. Sacroiliac Dysfunction: Continue with current medication, heat and exercise regime  30 minutes of face to face patient care time was spent during this visit. All questions were encouraged and answered.  F/U in 1 month.

## 2014-12-11 ENCOUNTER — Encounter: Payer: Self-pay | Admitting: Registered Nurse

## 2014-12-11 ENCOUNTER — Encounter
Payer: Federal, State, Local not specified - PPO | Attending: Physical Medicine & Rehabilitation | Admitting: Registered Nurse

## 2014-12-11 VITALS — BP 135/69 | HR 59 | Resp 16

## 2014-12-11 DIAGNOSIS — M533 Sacrococcygeal disorders, not elsewhere classified: Secondary | ICD-10-CM | POA: Diagnosis not present

## 2014-12-11 DIAGNOSIS — G8928 Other chronic postprocedural pain: Secondary | ICD-10-CM | POA: Insufficient documentation

## 2014-12-11 DIAGNOSIS — Z5181 Encounter for therapeutic drug level monitoring: Secondary | ICD-10-CM | POA: Diagnosis not present

## 2014-12-11 DIAGNOSIS — M545 Low back pain: Secondary | ICD-10-CM | POA: Diagnosis not present

## 2014-12-11 DIAGNOSIS — M79604 Pain in right leg: Secondary | ICD-10-CM | POA: Diagnosis not present

## 2014-12-11 DIAGNOSIS — M5416 Radiculopathy, lumbar region: Secondary | ICD-10-CM

## 2014-12-11 DIAGNOSIS — M79605 Pain in left leg: Secondary | ICD-10-CM | POA: Diagnosis not present

## 2014-12-11 DIAGNOSIS — M961 Postlaminectomy syndrome, not elsewhere classified: Secondary | ICD-10-CM | POA: Diagnosis not present

## 2014-12-11 DIAGNOSIS — Z79899 Other long term (current) drug therapy: Secondary | ICD-10-CM

## 2014-12-11 DIAGNOSIS — Z981 Arthrodesis status: Secondary | ICD-10-CM | POA: Diagnosis not present

## 2014-12-11 DIAGNOSIS — G8929 Other chronic pain: Secondary | ICD-10-CM

## 2014-12-11 DIAGNOSIS — G894 Chronic pain syndrome: Secondary | ICD-10-CM

## 2014-12-11 NOTE — Progress Notes (Signed)
Subjective:    Patient ID: Samuel [name], male    DOB: 1975/07/17, 40 y.o.   MRN: [id number]  HPI: Samuel [name] is a 39 year old male who returns for follow up for chronic pain and medication refill. He says his pain is located in his lower back radiating into his lower extremities laterally. He rates his pain 7. His current exercise regime is performing stretching exercises and walking short distances.  Pain Inventory Average Pain 6 Pain Right Now 7 My pain is sharp, stabbing and aching  In the last 24 hours, has pain interfered with the following? General activity 9 Relation with others 8 Enjoyment of life 9 What TIME of day is your pain at its worst? morning  evening and night Sleep (in general) Fair  Pain is worse with: walking, bending, sitting and standing Pain improves with: rest and medication Relief from Meds: 5  Mobility walk without assistance how many minutes can you walk? 5 ability to climb steps?  yes do you drive?  yes use a wheelchair transfers alone  Function disabled: date disabled 2011 I need assistance with the following:  bathing, meal prep, household duties and shopping  Neuro/Psych weakness trouble walking spasms depression  Prior Studies Any changes since last visit?  no  Physicians involved in your care Any changes since last visit?  no   History reviewed. No pertinent family history. History   Social History  . Marital Status: Married    Spouse Name: N/A  . Number of Children: N/A  . Years of Education: N/A   Social History Main Topics  . Smoking status: Never Smoker   . Smokeless tobacco: Not on file  . Alcohol Use: Yes     Comment: very rare (not since son was born)  . Drug Use: No  . Sexual Activity: Not on file   Other Topics Concern  . None   Social History Narrative   Past Surgical History  Procedure Laterality Date  . Back surgery      lower back surgery x 2   Past Medical History  Diagnosis Date  .  GERD (gastroesophageal reflux disease)   . Headache(784.0)   . Arthritis     "some in my back"   BP 135/69 mmHg  Pulse 59  Resp 16  SpO2 98%  Opioid Risk Score:   Fall Risk Score:  `1  Depression screen PHQ 2/9  Depression screen PHQ 2/9 11/01/2014  Decreased Interest 1  Down, Depressed, Hopeless 1  PHQ - 2 Score 2  Altered sleeping 1  Tired, decreased energy 1  Change in appetite 0  Feeling bad or failure about yourself  2  Trouble concentrating 0  Moving slowly or fidgety/restless 0  Suicidal thoughts 0  PHQ-9 Score 6     Review of Systems  Musculoskeletal: Positive for gait problem.  Neurological: Positive for weakness.  Psychiatric/Behavioral: Positive for dysphoric mood.  All other systems reviewed and are negative.      Objective:   Physical Exam  Constitutional: He is oriented to person, place, and time. He appears well-developed and well-nourished.  HENT:  Head: Normocephalic and atraumatic.  Neck: Normal range of motion. Neck supple.  Cardiovascular: Normal rate and regular rhythm.   Pulmonary/Chest: Effort normal and breath sounds normal.  Musculoskeletal:  Normal Muscle Bulk and Muscle Testing Reveals: Upper extremities: Decreased ROM 90 Degrees and Muscle Strength 5/5 Lumbar Paraspinal Tenderness: L-1- L-5 Lower Extremities: Full ROM and Muscle Strength 5/5  Right Lower Extremity Flexion Produces Popping into Patella Left Lower extremity Flexion Produces Pain into Lumbar Arises from chair slowly Antalgic gait  Neurological: He is alert and oriented to person, place, and time.  Skin: Skin is warm and dry.  Psychiatric: He has a normal mood and affect.  Nursing note and vitals reviewed.         Assessment & Plan:  1. 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 He does have lumbar degenerative disc disease :No Script Given: Continue: MS Contin 30 mg one tablet every 12 hours #60 and Oxycodone 10/325 mg one tablet  every 6 hours as needed for pain #120.  2. Sacroiliac Dysfunction: Continue with current medication, heat and exercise regime  20 minutes of face to face patient care time was spent during this visit. All questions were encouraged and answered.  F/U in 1 month.

## 2014-12-31 ENCOUNTER — Encounter
Payer: Federal, State, Local not specified - PPO | Attending: Physical Medicine & Rehabilitation | Admitting: Registered Nurse

## 2014-12-31 ENCOUNTER — Encounter: Payer: Self-pay | Admitting: Registered Nurse

## 2014-12-31 VITALS — BP 138/66 | HR 68 | Resp 14

## 2014-12-31 DIAGNOSIS — M961 Postlaminectomy syndrome, not elsewhere classified: Secondary | ICD-10-CM | POA: Insufficient documentation

## 2014-12-31 DIAGNOSIS — M79604 Pain in right leg: Secondary | ICD-10-CM | POA: Diagnosis not present

## 2014-12-31 DIAGNOSIS — Z79899 Other long term (current) drug therapy: Secondary | ICD-10-CM

## 2014-12-31 DIAGNOSIS — M545 Low back pain: Secondary | ICD-10-CM | POA: Diagnosis not present

## 2014-12-31 DIAGNOSIS — G8928 Other chronic postprocedural pain: Secondary | ICD-10-CM | POA: Insufficient documentation

## 2014-12-31 DIAGNOSIS — M533 Sacrococcygeal disorders, not elsewhere classified: Secondary | ICD-10-CM | POA: Diagnosis not present

## 2014-12-31 DIAGNOSIS — M79605 Pain in left leg: Secondary | ICD-10-CM | POA: Insufficient documentation

## 2014-12-31 DIAGNOSIS — Z981 Arthrodesis status: Secondary | ICD-10-CM | POA: Diagnosis not present

## 2014-12-31 DIAGNOSIS — Z5181 Encounter for therapeutic drug level monitoring: Secondary | ICD-10-CM | POA: Diagnosis not present

## 2014-12-31 MED ORDER — OXYCODONE-ACETAMINOPHEN 10-325 MG PO TABS: 1.0000 | ORAL_TABLET | Freq: Four times a day (QID) | ORAL | Status: DC | PRN

## 2014-12-31 MED ORDER — MORPHINE SULFATE ER 30 MG PO TBCR: 30.0000 mg | EXTENDED_RELEASE_TABLET | Freq: Two times a day (BID) | ORAL | Status: DC

## 2014-12-31 NOTE — Progress Notes (Signed)
Subjective:    Patient ID: Samuel [name], male    DOB: [birth year], 39 y.o.   MRN: [id number]  HPI: [name] is a 39 year old male who returns for follow up for chronic pain and medication refill. He says his pain is located in his lower back and  lower extremities. He rates his pain 7. His current exercise regime is performing stretching exercises and walking short distances.  Pain Inventory Average Pain 6 Pain Right Now 7 My pain is constant, sharp, dull, stabbing and aching  In the last 24 hours, has pain interfered with the following? General activity 8 Relation with others 9 Enjoyment of life 9 What TIME of day is your pain at its worst? morning, evening and night Sleep (in general) Fair  Pain is worse with: walking, bending, sitting, standing and some activites Pain improves with: rest and medication Relief from Meds: 5  Mobility walk without assistance how many minutes can you walk? 5 ability to climb steps?  yes do you drive?  no  Function disabled: date disabled .  Neuro/Psych weakness numbness trouble walking spasms depression  Prior Studies Any changes since last visit?  no  Physicians involved in your care Any changes since last visit?  no   History reviewed. No pertinent family history. History   Social History  . Marital Status: Married    Spouse Name: N/A  . Number of Children: N/A  . Years of Education: N/A   Social History Main Topics  . Smoking status: Never Smoker   . Smokeless tobacco: Not on file  . Alcohol Use: Yes     Comment: very rare (not since son was born)  . Drug Use: No  . Sexual Activity: Not on file   Other Topics Concern  . None   Social History Narrative   Past Surgical History  Procedure Laterality Date  . Back surgery      lower back surgery x 2   Past Medical History  Diagnosis Date  . GERD (gastroesophageal reflux disease)   . Headache(784.0)   . Arthritis     "some in my back"   BP 138/66  mmHg  Pulse 68  Resp 14  SpO2 99%  Opioid Risk Score:   Fall Risk Score:  `1  Depression screen PHQ 2/9  Depression screen PHQ 2/9 11/01/2014  Decreased Interest 1  Down, Depressed, Hopeless 1  PHQ - 2 Score 2  Altered sleeping 1  Tired, decreased energy 1  Change in appetite 0  Feeling bad or failure about yourself  2  Trouble concentrating 0  Moving slowly or fidgety/restless 0  Suicidal thoughts 0  PHQ-9 Score 6     Review of Systems  HENT: Negative.   Eyes: Negative.   Respiratory: Negative.   Cardiovascular: Negative.   Gastrointestinal: Negative.   Endocrine: Negative.   Genitourinary: Negative.   Musculoskeletal: Positive for myalgias, back pain and arthralgias.  Skin: Negative.   Allergic/Immunologic: Negative.   Neurological: Positive for weakness and numbness.       Trouble walking, spasms  Hematological: Negative.   Psychiatric/Behavioral: Positive for dysphoric mood.       Objective:   Physical Exam  Constitutional: He is oriented to person, place, and time. He appears well-developed and well-nourished.  HENT:  Head: Normocephalic and atraumatic.  Neck: Normal range of motion. Neck supple.  Cardiovascular: Normal rate and regular rhythm.   Pulmonary/Chest: Effort normal and breath sounds normal.  Musculoskeletal:  Normal Muscle Bulk and Muscle Testing Reveals: Upper Extremities: Full ROM and Muscle Strength 5/5 Lumbar Paraspinal Tenderness: L-3- L-5 Lower Extremities: Full ROM and Muscle Strength 5/5 Right Lower Extremity Flexion Produces Pain into Lumbar Arises from chair slowly Antalgic Gait  Neurological: He is alert and oriented to person, place, and time.  Skin: Skin is warm and dry.  Psychiatric: He has a normal mood and affect.  Nursing note and vitals reviewed.         Assessment & Plan:  1. 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 He does have lumbar degenerative disc disease :Refilled: MS  Contin 30 mg one tablet every 12 hours #60 and Oxycodone 10/325 mg one tablet every 6 hours as needed for pain #120.  2. Sacroiliac Dysfunction: Continue with current medication, heat and exercise regime  20 minutes of face to face patient care time was spent during this visit. All questions were encouraged and answered.  F/U in 1 month.

## 2015-02-04 ENCOUNTER — Encounter
Payer: Federal, State, Local not specified - PPO | Attending: Physical Medicine & Rehabilitation | Admitting: Registered Nurse

## 2015-02-04 ENCOUNTER — Encounter: Payer: Self-pay | Admitting: Registered Nurse

## 2015-02-04 ENCOUNTER — Other Ambulatory Visit: Payer: Self-pay | Admitting: Physical Medicine & Rehabilitation

## 2015-02-04 VITALS — BP 126/71 | HR 84

## 2015-02-04 DIAGNOSIS — M961 Postlaminectomy syndrome, not elsewhere classified: Secondary | ICD-10-CM | POA: Diagnosis not present

## 2015-02-04 DIAGNOSIS — M79605 Pain in left leg: Secondary | ICD-10-CM | POA: Diagnosis not present

## 2015-02-04 DIAGNOSIS — M533 Sacrococcygeal disorders, not elsewhere classified: Secondary | ICD-10-CM

## 2015-02-04 DIAGNOSIS — G8928 Other chronic postprocedural pain: Secondary | ICD-10-CM | POA: Diagnosis not present

## 2015-02-04 DIAGNOSIS — Z981 Arthrodesis status: Secondary | ICD-10-CM | POA: Insufficient documentation

## 2015-02-04 DIAGNOSIS — M545 Low back pain: Secondary | ICD-10-CM | POA: Insufficient documentation

## 2015-02-04 DIAGNOSIS — M62830 Muscle spasm of back: Secondary | ICD-10-CM

## 2015-02-04 DIAGNOSIS — Z79899 Other long term (current) drug therapy: Secondary | ICD-10-CM

## 2015-02-04 DIAGNOSIS — Z5181 Encounter for therapeutic drug level monitoring: Secondary | ICD-10-CM

## 2015-02-04 DIAGNOSIS — M79604 Pain in right leg: Secondary | ICD-10-CM | POA: Insufficient documentation

## 2015-02-04 MED ORDER — OXYCODONE-ACETAMINOPHEN 10-325 MG PO TABS: 1.0000 | ORAL_TABLET | Freq: Four times a day (QID) | ORAL | Status: DC | PRN

## 2015-02-04 MED ORDER — METHOCARBAMOL 500 MG PO TABS: 500.0000 mg | ORAL_TABLET | Freq: Three times a day (TID) | ORAL | Status: DC | PRN

## 2015-02-04 MED ORDER — MORPHINE SULFATE ER 30 MG PO TBCR: 30.0000 mg | EXTENDED_RELEASE_TABLET | Freq: Two times a day (BID) | ORAL | Status: DC

## 2015-02-04 NOTE — Addendum Note (Signed)
Addended by: Caro Hight on: 02/04/2015 12:54 PM   Modules accepted: Orders

## 2015-02-04 NOTE — Progress Notes (Signed)
Subjective:    Patient ID: Samuel [name], male    DOB: [birth year], 39 y.o.   MRN: [id number]  HPI: [name] is a 39 year old male who returns for follow up for chronic pain and medication refill. He says his pain is located in his lower back.He rates his pain 7. His current exercise regime is performing stretching exercises and walking short distances.  Pain Inventory Average Pain 6 Pain Right Now 7 My pain is constant, sharp, dull and stabbing  In the last 24 hours, has pain interfered with the following? General activity 9 Relation with others 8 Enjoyment of life 9 What TIME of day is your pain at its worst? morning,evening,night Sleep (in general) Fair  Pain is worse with: walking, bending, sitting and standing Pain improves with: rest and medication Relief from Meds: 5  Mobility how many minutes can you walk? 0 ability to climb steps?  yes do you drive?  yes Do you have any goals in this area?  no  Function disabled: date disabled 2011 I need assistance with the following:  dressing, bathing, meal prep, household duties and shopping Do you have any goals in this area?  no  Neuro/Psych weakness tingling trouble walking spasms depression  Prior Studies Any changes since last visit?  no  Physicians involved in your care Any changes since last visit?  no   No family history on file. Social History   Social History  . Marital Status: Married    Spouse Name: N/A  . Number of Children: N/A  . Years of Education: N/A   Social History Main Topics  . Smoking status: Never Smoker   . Smokeless tobacco: None  . Alcohol Use: Yes     Comment: very rare (not since son was born)  . Drug Use: No  . Sexual Activity: Not Asked   Other Topics Concern  . None   Social History Narrative   Past Surgical History  Procedure Laterality Date  . Back surgery      lower back surgery x 2   Past Medical History  Diagnosis Date  . GERD (gastroesophageal  reflux disease)   . Headache(784.0)   . Arthritis     "some in my back"   BP 126/71 mmHg  Pulse 84  SpO2 98%  Opioid Risk Score:   Fall Risk Score:  `1  Depression screen PHQ 2/9  Depression screen [facility] 2/9 02/04/2015 11/01/2014  Decreased Interest 0 1  Down, Depressed, Hopeless 0 1  PHQ - 2 Score 0 2  Altered sleeping - 1  Tired, decreased energy - 1  Change in appetite - 0  Feeling bad or failure about yourself  - 2  Trouble concentrating - 0  Moving slowly or fidgety/restless - 0  Suicidal thoughts - 0  PHQ-9 Score - 6     Review of Systems  All other systems reviewed and are negative.      Objective:   Physical Exam  Constitutional: He is oriented to person, place, and time. He appears well-developed and well-nourished.  HENT:  Head: Normocephalic and atraumatic.  Neck: Normal range of motion. Neck supple.  Cardiovascular: Normal rate and regular rhythm.   Pulmonary/Chest: Effort normal and breath sounds normal.  Musculoskeletal:  Normal Muscle Bulk and Muscle Testing Reveals: Upper Extremities: Full ROM and Muscle Strength 5/5 Lumbar Paraspinal Tenderness: L-3- L-5 Lower Extremities: Full ROM and Muscle Strength 5/5 Bilateral Lower Extremity Flexion Produces pain into Lumbar Arises  from chair slowly Antalgic Gait  Neurological: He is alert and oriented to person, place, and time.  Skin: Skin is warm and dry.  Psychiatric: He has a normal mood and affect.  Nursing note and vitals reviewed.         Assessment & Plan:  1. 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 He does have lumbar degenerative disc disease :Refilled: MS Contin 30 mg one tablet every 12 hours #60 and Oxycodone 10/325 mg one tablet every 6 hours as needed for pain #120.  2. Sacroiliac Dysfunction: Continue with current medication, heat and exercise regime  20 minutes of face to face patient care time was spent during this visit. All questions were  encouraged and answered.  F/U in 1 month.

## 2015-02-05 LAB — PMP ALCOHOL METABOLITE (ETG): Ethyl Glucuronide (EtG): NEGATIVE ng/mL

## 2015-02-08 LAB — OPIATES/OPIOIDS (LC/MS-MS)
Codeine Urine: NEGATIVE ng/mL (ref <50)
Hydrocodone: NEGATIVE ng/mL (ref <50)
Hydromorphone: NEGATIVE ng/mL — AB (ref <50)
Morphine Urine: 27220 ng/mL (ref <50)
Norhydrocodone, Ur: NEGATIVE ng/mL (ref <50)
Noroxycodone, Ur: 7586 ng/mL (ref <50)
Oxycodone, ur: 3812 ng/mL (ref <50)
Oxymorphone: 132 ng/mL (ref <50)

## 2015-02-08 LAB — OXYCODONE, URINE (LC/MS-MS)
Noroxycodone, Ur: 7586 ng/mL (ref <50)
Oxycodone, ur: 3812 ng/mL (ref <50)
Oxymorphone: 132 ng/mL (ref <50)

## 2015-02-09 LAB — PRESCRIPTION MONITORING PROFILE (SOLSTAS)
Amphetamine/Meth: NEGATIVE ng/mL
Barbiturate Screen, Urine: NEGATIVE ng/mL
Benzodiazepine Screen, Urine: NEGATIVE ng/mL
Buprenorphine, Urine: NEGATIVE ng/mL
Cannabinoid Scrn, Ur: NEGATIVE ng/mL
Carisoprodol, Urine: NEGATIVE ng/mL
Cocaine Metabolites: NEGATIVE ng/mL
Creatinine, Urine: 113.87 mg/dL (ref >20.0)
Fentanyl, Ur: NEGATIVE ng/mL
MDMA URINE: NEGATIVE ng/mL
Meperidine, Ur: NEGATIVE ng/mL
Methadone Screen, Urine: NEGATIVE ng/mL
Nitrites, Initial: NEGATIVE ug/mL
Propoxyphene: NEGATIVE ng/mL
Tapentadol, urine: NEGATIVE ng/mL
Tramadol Scrn, Ur: NEGATIVE ng/mL
Zolpidem, Urine: NEGATIVE ng/mL
pH, Initial: 5.3 pH (ref 4.5–8.9)

## 2015-03-01 DIAGNOSIS — M545 Low back pain: Secondary | ICD-10-CM | POA: Diagnosis not present

## 2015-03-01 DIAGNOSIS — R030 Elevated blood-pressure reading, without diagnosis of hypertension: Secondary | ICD-10-CM | POA: Diagnosis not present

## 2015-03-01 DIAGNOSIS — Z6841 Body Mass Index (BMI) 40.0 and over, adult: Secondary | ICD-10-CM | POA: Diagnosis not present

## 2015-03-04 ENCOUNTER — Encounter: Payer: Self-pay | Admitting: Registered Nurse

## 2015-03-04 ENCOUNTER — Encounter
Payer: Federal, State, Local not specified - PPO | Attending: Physical Medicine & Rehabilitation | Admitting: Registered Nurse

## 2015-03-04 VITALS — BP 142/77 | HR 94

## 2015-03-04 DIAGNOSIS — M5136 Other intervertebral disc degeneration, lumbar region: Secondary | ICD-10-CM

## 2015-03-04 DIAGNOSIS — G894 Chronic pain syndrome: Secondary | ICD-10-CM

## 2015-03-04 DIAGNOSIS — G8929 Other chronic pain: Secondary | ICD-10-CM

## 2015-03-04 DIAGNOSIS — M79605 Pain in left leg: Secondary | ICD-10-CM | POA: Diagnosis not present

## 2015-03-04 DIAGNOSIS — M545 Low back pain: Secondary | ICD-10-CM | POA: Insufficient documentation

## 2015-03-04 DIAGNOSIS — Z5181 Encounter for therapeutic drug level monitoring: Secondary | ICD-10-CM

## 2015-03-04 DIAGNOSIS — M5416 Radiculopathy, lumbar region: Secondary | ICD-10-CM

## 2015-03-04 DIAGNOSIS — M961 Postlaminectomy syndrome, not elsewhere classified: Secondary | ICD-10-CM | POA: Diagnosis not present

## 2015-03-04 DIAGNOSIS — M79604 Pain in right leg: Secondary | ICD-10-CM | POA: Insufficient documentation

## 2015-03-04 DIAGNOSIS — G8928 Other chronic postprocedural pain: Secondary | ICD-10-CM | POA: Diagnosis not present

## 2015-03-04 DIAGNOSIS — Z79899 Other long term (current) drug therapy: Secondary | ICD-10-CM

## 2015-03-04 DIAGNOSIS — Z981 Arthrodesis status: Secondary | ICD-10-CM | POA: Insufficient documentation

## 2015-03-04 MED ORDER — IBUPROFEN 800 MG PO TABS: 800.0000 mg | ORAL_TABLET | Freq: Three times a day (TID) | ORAL | Status: DC | PRN

## 2015-03-04 MED ORDER — MORPHINE SULFATE ER 30 MG PO TBCR: 30.0000 mg | EXTENDED_RELEASE_TABLET | Freq: Two times a day (BID) | ORAL | Status: DC

## 2015-03-04 MED ORDER — GABAPENTIN 300 MG PO CAPS: ORAL_CAPSULE | ORAL | Status: DC

## 2015-03-04 MED ORDER — OXYCODONE-ACETAMINOPHEN 10-325 MG PO TABS: 1.0000 | ORAL_TABLET | Freq: Four times a day (QID) | ORAL | Status: DC | PRN

## 2015-03-04 NOTE — Progress Notes (Signed)
Subjective:    Patient ID: Samuel [name], male    DOB: 05/29/1975, 39 y.o.   MRN: [id number]  HPI: Mr. [name] is a 39 year old male who returns for follow up for chronic pain and medication refill. He says his pain is located in his lower back radiating into bilateral lower extremities laterally and anterioraly.He rates his pain 6. His current exercise regime is performing stretching exercises and walking short distances.  Pain Inventory Average Pain 7 Pain Right Now 6 My pain is sharp, dull, stabbing and aching  In the last 24 hours, has pain interfered with the following? General activity 8 Relation with others 8 Enjoyment of life 9 What TIME of day is your pain at its worst? morning evening and night Sleep (in general) NA  Pain is worse with: walking, bending, sitting and standing Pain improves with: rest and medication Relief from Meds: 5  Mobility how many minutes can you walk? 5 ability to climb steps?  yes do you drive?  yes  Function disabled: date disabled 2010 I need assistance with the following:  dressing, bathing, meal prep, household duties and shopping  Neuro/Psych weakness trouble walking spasms depression  Prior Studies Any changes since last visit?  no  Physicians involved in your care Any changes since last visit?  no   History reviewed. No pertinent family history. Social History   Social History  . Marital Status: Married    Spouse Name: N/A  . Number of Children: N/A  . Years of Education: N/A   Social History Main Topics  . Smoking status: Never Smoker   . Smokeless tobacco: None  . Alcohol Use: Yes     Comment: very rare (not since son was born)  . Drug Use: No  . Sexual Activity: Not Asked   Other Topics Concern  . None   Social History Narrative   Past Surgical History  Procedure Laterality Date  . Back surgery      lower back surgery x 2   Past Medical History  Diagnosis Date  . GERD (gastroesophageal  reflux disease)   . Headache(784.0)   . Arthritis     "some in my back"   BP 142/77 mmHg  Pulse 94  SpO2 98%  Opioid Risk Score:   Fall Risk Score:  `1  Depression screen PHQ 2/9  Depression screen [facility] 2/9 03/04/2015 02/04/2015 11/01/2014  Decreased Interest 0 0 1  Down, Depressed, Hopeless 0 0 1  PHQ - 2 Score 0 0 2  Altered sleeping - - 1  Tired, decreased energy - - 1  Change in appetite - - 0  Feeling bad or failure about yourself  - - 2  Trouble concentrating - - 0  Moving slowly or fidgety/restless - - 0  Suicidal thoughts - - 0  PHQ-9 Score - - 6     Review of Systems  Musculoskeletal: Positive for gait problem.       Spasms  Neurological: Positive for weakness.  Psychiatric/Behavioral: Positive for dysphoric mood.  All other systems reviewed and are negative.      Objective:   Physical Exam  Constitutional: He is oriented to person, place, and time. He appears well-developed and well-nourished.  HENT:  Head: Normocephalic and atraumatic.  Neck: Normal range of motion. Neck supple.  Cardiovascular: Normal rate and regular rhythm.   Pulmonary/Chest: Effort normal and breath sounds normal.  Musculoskeletal:  Normal Muscle Bulk and Muscle Testing Reveals: Upper Extremities: Full  ROM and Muscle Strength 5/5 Lumbar Paraspinal Tenderness: L-4- L-5 Lower Extremities: Full ROM and Muscle Strength 5/5 Bilateral Lower Extremity Flexion Produces Pain into Lower Back Arises from chair slowly Antalgic Gait  Neurological: He is alert and oriented to person, place, and time.  Skin: Skin is warm and dry.  Psychiatric: He has a normal mood and affect.  Nursing note and vitals reviewed.         Assessment & Plan:  1. 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 He has lumbar degenerative disc disease :Refilled: MS Contin 30 mg one tablet every 12 hours #60 and Oxycodone 10/325 mg one tablet every 6 hours as needed for pain #120.  2.  Sacroiliac Dysfunction: Continue with current medication, heat and exercise regime  20 minutes of face to face patient care time was spent during this visit. All questions were encouraged and answered.  F/U in 1 month.

## 2015-03-22 NOTE — Progress Notes (Signed)
Urine drug screen for this encounter is consistent for prescribed medication 

## 2015-04-01 ENCOUNTER — Encounter
Payer: Federal, State, Local not specified - PPO | Attending: Physical Medicine & Rehabilitation | Admitting: Registered Nurse

## 2015-04-01 ENCOUNTER — Encounter: Payer: Self-pay | Admitting: Registered Nurse

## 2015-04-01 VITALS — BP 128/72 | HR 67

## 2015-04-01 DIAGNOSIS — M5136 Other intervertebral disc degeneration, lumbar region: Secondary | ICD-10-CM | POA: Diagnosis not present

## 2015-04-01 DIAGNOSIS — M961 Postlaminectomy syndrome, not elsewhere classified: Secondary | ICD-10-CM | POA: Diagnosis not present

## 2015-04-01 DIAGNOSIS — G8928 Other chronic postprocedural pain: Secondary | ICD-10-CM | POA: Insufficient documentation

## 2015-04-01 DIAGNOSIS — Z5181 Encounter for therapeutic drug level monitoring: Secondary | ICD-10-CM

## 2015-04-01 DIAGNOSIS — M62830 Muscle spasm of back: Secondary | ICD-10-CM

## 2015-04-01 DIAGNOSIS — Z79899 Other long term (current) drug therapy: Secondary | ICD-10-CM

## 2015-04-01 DIAGNOSIS — G894 Chronic pain syndrome: Secondary | ICD-10-CM

## 2015-04-01 DIAGNOSIS — M5416 Radiculopathy, lumbar region: Secondary | ICD-10-CM

## 2015-04-01 DIAGNOSIS — Z981 Arthrodesis status: Secondary | ICD-10-CM | POA: Diagnosis not present

## 2015-04-01 DIAGNOSIS — M79604 Pain in right leg: Secondary | ICD-10-CM | POA: Insufficient documentation

## 2015-04-01 DIAGNOSIS — M545 Low back pain: Secondary | ICD-10-CM | POA: Insufficient documentation

## 2015-04-01 DIAGNOSIS — G8929 Other chronic pain: Secondary | ICD-10-CM

## 2015-04-01 DIAGNOSIS — M79605 Pain in left leg: Secondary | ICD-10-CM | POA: Diagnosis not present

## 2015-04-01 MED ORDER — OXYCODONE-ACETAMINOPHEN 10-325 MG PO TABS
1.0000 | ORAL_TABLET | Freq: Four times a day (QID) | ORAL | Status: DC | PRN
Start: 2015-04-01 — End: 2015-05-06

## 2015-04-01 MED ORDER — METHOCARBAMOL 500 MG PO TABS: 500.0000 mg | ORAL_TABLET | Freq: Three times a day (TID) | ORAL | Status: DC | PRN

## 2015-04-01 MED ORDER — MORPHINE SULFATE ER 30 MG PO TBCR: 30.0000 mg | EXTENDED_RELEASE_TABLET | Freq: Two times a day (BID) | ORAL | Status: DC

## 2015-04-01 NOTE — Progress Notes (Signed)
Subjective:    Patient ID: Samuel [name], male    DOB: 1976-03-01, 39 y.o.   MRN: [id number]  HPI: Samuel [name] is a 39 year old male who returns for follow up for chronic pain and medication refill. He says his pain is located in his lower back radiating into bilateral lower extremities laterally and anterioraly.He rates his pain 8. His current exercise regime is performing stretching exercises and walking short distances.  Pain Inventory Average Pain 6 Pain Right Now 8 My pain is sharp, dull, stabbing and aching  In the last 24 hours, has pain interfered with the following? General activity 9 Relation with others 9 Enjoyment of life 9 What TIME of day is your pain at its worst? Morning, Evening and Night Sleep (in general) Fair  Pain is worse with: walking, bending, sitting and standing Pain improves with: rest and medication Relief from Meds: NA  Mobility walk with assistance how many minutes can you walk? 2 ability to climb steps?  yes do you drive?  yes Do you have any goals in this area?  no  Function disabled: date disabled 06/2010 I need assistance with the following:  bathing, meal prep, household duties and shopping Do you have any goals in this area?  no  Neuro/Psych weakness trouble walking spasms depression  Prior Studies Any changes since last visit?  no  Physicians involved in your care Any changes since last visit?  no   History reviewed. No pertinent family history. Social History   Social History  . Marital Status: Married    Spouse Name: N/A  . Number of Children: N/A  . Years of Education: N/A   Social History Main Topics  . Smoking status: Never Smoker   . Smokeless tobacco: None  . Alcohol Use: Yes     Comment: very rare (not since son was born)  . Drug Use: No  . Sexual Activity: Not Asked   Other Topics Concern  . None   Social History Narrative   Past Surgical History  Procedure Laterality Date  . Back surgery        lower back surgery x 2   Past Medical History  Diagnosis Date  . GERD (gastroesophageal reflux disease)   . Headache(784.0)   . Arthritis     "some in my back"   BP 128/72 mmHg  Pulse 67  SpO2 98%  Opioid Risk Score:   Fall Risk Score:  `1  Depression screen PHQ 2/9  Depression screen [facility] 2/9 03/04/2015 02/04/2015 11/01/2014  Decreased Interest 0 0 1  Down, Depressed, Hopeless 0 0 1  PHQ - 2 Score 0 0 2  Altered sleeping - - 1  Tired, decreased energy - - 1  Change in appetite - - 0  Feeling bad or failure about yourself  - - 2  Trouble concentrating - - 0  Moving slowly or fidgety/restless - - 0  Suicidal thoughts - - 0  PHQ-9 Score - - 6     Review of Systems  Musculoskeletal:       Spasms  Neurological: Positive for weakness.       Gait Instability  Psychiatric/Behavioral:       Depression  All other systems reviewed and are negative.      Objective:   Physical Exam  Constitutional: He is oriented to person, place, and time. He appears well-developed and well-nourished.  HENT:  Head: Normocephalic and atraumatic.  Neck: Normal range of motion. Neck  supple.  Cardiovascular: Normal rate and regular rhythm.   Pulmonary/Chest: Effort normal and breath sounds normal.  Musculoskeletal:  Normal Muscle Bulk and Muscle Testing Reveals: Upper Extremities: Decreased ROM 90 Degrees and Muscle Strength 5/5 Thoracic Paraspinal Tenderness: T-10- T-12 Lumbar Paraspinal Hypersensitivity Lower Extremities: Right: Decreased ROM and Muscle Strength 4/5 Right Lower Extremity Flexion Produces pain into Lumbar Left: Full ROM and Muscle Strength 5/5 Left Lower Extremity Flexion Produces Pain into Lumbar Arises from chair slowly Antalgic gait  Neurological: He is alert and oriented to person, place, and time.  Skin: Skin is warm and dry.  Psychiatric: He has a normal mood and affect.  Nursing note and vitals reviewed.         Assessment & Plan:  1. Lumbar  postlaminectomy syndrome with chronic low back and radicular pain. He has lumbar degenerative disc disease :Refilled: MS Contin 30 mg one tablet every 12 hours #60 and Oxycodone 10/325 mg one tablet every 6 hours as needed for pain #120.  2. Sacroiliac Dysfunction: Continue with current medication, heat and exercise regime  20 minutes of face to face patient care time was spent during this visit. All questions were encouraged and answered.  F/U in 1 month.

## 2015-05-06 ENCOUNTER — Encounter: Payer: Self-pay | Admitting: Registered Nurse

## 2015-05-06 ENCOUNTER — Encounter
Payer: Federal, State, Local not specified - PPO | Attending: Physical Medicine & Rehabilitation | Admitting: Registered Nurse

## 2015-05-06 VITALS — BP 126/66 | HR 69

## 2015-05-06 DIAGNOSIS — G8928 Other chronic postprocedural pain: Secondary | ICD-10-CM | POA: Diagnosis not present

## 2015-05-06 DIAGNOSIS — Z981 Arthrodesis status: Secondary | ICD-10-CM | POA: Insufficient documentation

## 2015-05-06 DIAGNOSIS — Z5181 Encounter for therapeutic drug level monitoring: Secondary | ICD-10-CM

## 2015-05-06 DIAGNOSIS — M79604 Pain in right leg: Secondary | ICD-10-CM | POA: Insufficient documentation

## 2015-05-06 DIAGNOSIS — M5136 Other intervertebral disc degeneration, lumbar region: Secondary | ICD-10-CM

## 2015-05-06 DIAGNOSIS — M79605 Pain in left leg: Secondary | ICD-10-CM | POA: Insufficient documentation

## 2015-05-06 DIAGNOSIS — G894 Chronic pain syndrome: Secondary | ICD-10-CM

## 2015-05-06 DIAGNOSIS — M5416 Radiculopathy, lumbar region: Secondary | ICD-10-CM

## 2015-05-06 DIAGNOSIS — M545 Low back pain: Secondary | ICD-10-CM | POA: Insufficient documentation

## 2015-05-06 DIAGNOSIS — G8929 Other chronic pain: Secondary | ICD-10-CM

## 2015-05-06 DIAGNOSIS — M961 Postlaminectomy syndrome, not elsewhere classified: Secondary | ICD-10-CM | POA: Diagnosis not present

## 2015-05-06 DIAGNOSIS — M62830 Muscle spasm of back: Secondary | ICD-10-CM

## 2015-05-06 DIAGNOSIS — Z79899 Other long term (current) drug therapy: Secondary | ICD-10-CM

## 2015-05-06 MED ORDER — OXYCODONE-ACETAMINOPHEN 10-325 MG PO TABS: 1.0000 | ORAL_TABLET | Freq: Four times a day (QID) | ORAL | Status: DC | PRN

## 2015-05-06 MED ORDER — MORPHINE SULFATE ER 30 MG PO TBCR: 30.0000 mg | EXTENDED_RELEASE_TABLET | Freq: Two times a day (BID) | ORAL | Status: DC

## 2015-05-06 NOTE — Progress Notes (Signed)
Subjective:    Patient ID: Samuel [name], male    DOB: 11-28-1975, 39 y.o.   MRN: [id number]  HPI: [name] is a 39 year old male who returns for follow up for chronic pain and medication refill. He says his pain is located in his lower back radiating into bilateral lower extremities laterally.He rates his pain 8. His current exercise regime is performing stretching exercises and walking short distances. Wife in room.  Pain Inventory Average Pain 7 Pain Right Now 8 My pain is sharp, dull, stabbing and aching  In the last 24 hours, has pain interfered with the following? General activity 9 Relation with others 9 Enjoyment of life 9 What TIME of day is your pain at its worst? Morning, Evening and Night Sleep (in general) Fair  Pain is worse with: walking, bending, sitting and standing Pain improves with: rest and medication Relief from Meds: 4  Mobility walk without assistance walk with assistance how many minutes can you walk? 3 ability to climb steps?  yes do you drive?  yes use a wheelchair Do you have any goals in this area?  no  Function disabled: date disabled NA I need assistance with the following:  dressing and meal prep Do you have any goals in this area?  yes  Neuro/Psych weakness spasms depression  Prior Studies Any changes since last visit?  no  Physicians involved in your care Any changes since last visit?  no   History reviewed. No pertinent family history. Social History   Social History  . Marital Status: Married    Spouse Name: N/A  . Number of Children: N/A  . Years of Education: N/A   Social History Main Topics  . Smoking status: Never Smoker   . Smokeless tobacco: None  . Alcohol Use: Yes     Comment: very rare (not since son was born)  . Drug Use: No  . Sexual Activity: Not Asked   Other Topics Concern  . None   Social History Narrative   Past Surgical History  Procedure Laterality Date  . Back surgery     lower back surgery x 2   Past Medical History  Diagnosis Date  . GERD (gastroesophageal reflux disease)   . Headache(784.0)   . Arthritis     "some in my back"   BP 126/66 mmHg  Pulse 69  SpO2 96%  Opioid Risk Score:   Fall Risk Score:  `1  Depression screen PHQ 2/9  Depression screen [facility] 2/9 03/04/2015 02/04/2015 11/01/2014  Decreased Interest 0 0 1  Down, Depressed, Hopeless 0 0 1  PHQ - 2 Score 0 0 2  Altered sleeping - - 1  Tired, decreased energy - - 1  Change in appetite - - 0  Feeling bad or failure about yourself  - - 2  Trouble concentrating - - 0  Moving slowly or fidgety/restless - - 0  Suicidal thoughts - - 0  PHQ-9 Score - - 6      Review of Systems  Musculoskeletal:       Spasms      Neurological: Positive for weakness.  Psychiatric/Behavioral:       Depression  All other systems reviewed and are negative.      Objective:   Physical Exam  Constitutional: He is oriented to person, place, and time. He appears well-developed and well-nourished.  HENT:  Head: Normocephalic and atraumatic.  Neck: Normal range of motion. Neck supple.  Cardiovascular: Normal  rate and regular rhythm.   Pulmonary/Chest: Effort normal and breath sounds normal.  Musculoskeletal:  Normal Muscle Bulk and Muscle Testing Reveals: Upper Extremities: Full ROM and Muscle Strength 5/5 Lumbar Hypersensitivity Lower Extremities with Decreased ROM and Muscle Strength 5/5 Arises from chair slowly Antalgic Gait  Neurological: He is alert and oriented to person, place, and time.  Skin: Skin is warm and dry.  Psychiatric: He has a normal mood and affect.  Nursing note and vitals reviewed.         Assessment & Plan:  1. 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 He has lumbar degenerative disc disease Refilled: MS Contin 30 mg one tablet every 12 hours #60 and Oxycodone 10/325 mg one tablet every 6 hours as needed for pain #120.  2. Sacroiliac  Dysfunction: Continue with current medication, heat and exercise regime  20 minutes of face to face patient care time was spent during this visit. All questions were encouraged and answered.  F/U in 1 month.

## 2015-06-06 ENCOUNTER — Encounter: Payer: Self-pay | Admitting: Registered Nurse

## 2015-06-06 ENCOUNTER — Encounter
Payer: Federal, State, Local not specified - PPO | Attending: Physical Medicine & Rehabilitation | Admitting: Registered Nurse

## 2015-06-06 VITALS — BP 133/72 | HR 74

## 2015-06-06 DIAGNOSIS — M79604 Pain in right leg: Secondary | ICD-10-CM | POA: Insufficient documentation

## 2015-06-06 DIAGNOSIS — M62830 Muscle spasm of back: Secondary | ICD-10-CM

## 2015-06-06 DIAGNOSIS — Z981 Arthrodesis status: Secondary | ICD-10-CM | POA: Insufficient documentation

## 2015-06-06 DIAGNOSIS — Z5181 Encounter for therapeutic drug level monitoring: Secondary | ICD-10-CM | POA: Diagnosis not present

## 2015-06-06 DIAGNOSIS — G8929 Other chronic pain: Secondary | ICD-10-CM | POA: Diagnosis not present

## 2015-06-06 DIAGNOSIS — M79605 Pain in left leg: Secondary | ICD-10-CM | POA: Diagnosis not present

## 2015-06-06 DIAGNOSIS — G8928 Other chronic postprocedural pain: Secondary | ICD-10-CM | POA: Insufficient documentation

## 2015-06-06 DIAGNOSIS — Z79899 Other long term (current) drug therapy: Secondary | ICD-10-CM

## 2015-06-06 DIAGNOSIS — M5416 Radiculopathy, lumbar region: Secondary | ICD-10-CM | POA: Diagnosis not present

## 2015-06-06 DIAGNOSIS — M5136 Other intervertebral disc degeneration, lumbar region: Secondary | ICD-10-CM

## 2015-06-06 DIAGNOSIS — M961 Postlaminectomy syndrome, not elsewhere classified: Secondary | ICD-10-CM | POA: Insufficient documentation

## 2015-06-06 DIAGNOSIS — M545 Low back pain: Secondary | ICD-10-CM | POA: Diagnosis not present

## 2015-06-06 DIAGNOSIS — G894 Chronic pain syndrome: Secondary | ICD-10-CM | POA: Diagnosis not present

## 2015-06-06 MED ORDER — METHOCARBAMOL 500 MG PO TABS
500.0000 mg | ORAL_TABLET | Freq: Three times a day (TID) | ORAL | Status: DC | PRN
Start: 2015-06-06 — End: 2015-10-07

## 2015-06-06 MED ORDER — OXYCODONE-ACETAMINOPHEN 10-325 MG PO TABS: 1.0000 | ORAL_TABLET | Freq: Four times a day (QID) | ORAL | Status: DC | PRN

## 2015-06-06 MED ORDER — MORPHINE SULFATE ER 30 MG PO TBCR
30.0000 mg | EXTENDED_RELEASE_TABLET | Freq: Two times a day (BID) | ORAL | Status: DC
Start: 2015-06-06 — End: 2015-07-08

## 2015-06-06 NOTE — Progress Notes (Signed)
Subjective:    Patient ID: Samuel [name], male    DOB: [birth year], 40 y.o.   MRN: [id number]  HPI: Samuel [name] is a 40 year old male who returns for follow up for chronic pain and medication refill. He says his pain is located in his lower back radiating into bilateral lower extremities anteriorly.He rates his pain 7. His current exercise regime is performing stretching exercises and walking short distances.   Pain Inventory Average Pain 6 Pain Right Now 7 My pain is sharp, dull, stabbing and aching  In the last 24 hours, has pain interfered with the following? General activity 8 Relation with others 8 Enjoyment of life 9 What TIME of day is your pain at its worst? morning  evening and night Sleep (in general) NA  Pain is worse with: walking, bending, sitting and standing Pain improves with: rest and medication Relief from Meds: 3  Mobility how many minutes can you walk? 5 ability to climb steps?  yes do you drive?  yes  Function disabled: date disabled 2010 I need assistance with the following:  dressing, bathing, meal prep and household duties  Neuro/Psych weakness numbness tingling trouble walking spasms depression  Prior Studies Any changes since last visit?  no  Physicians involved in your care Any changes since last visit?  no   History reviewed. No pertinent family history. Social History   Social History  . Marital Status: Married    Spouse Name: N/A  . Number of Children: N/A  . Years of Education: N/A   Social History Main Topics  . Smoking status: Never Smoker   . Smokeless tobacco: None  . Alcohol Use: Yes     Comment: very rare (not since son was born)  . Drug Use: No  . Sexual Activity: Not Asked   Other Topics Concern  . None   Social History Narrative   Past Surgical History  Procedure Laterality Date  . Back surgery      lower back surgery x 2   Past Medical History  Diagnosis Date  . GERD (gastroesophageal reflux  disease)   . Headache(784.0)   . Arthritis     "some in my back"   BP 133/72 mmHg  Pulse 74  SpO2 97%  Opioid Risk Score:   Fall Risk Score:  `1  Depression screen PHQ 2/9  Depression screen [facility] 2/9 06/06/2015 03/04/2015 02/04/2015 11/01/2014  Decreased Interest 0 0 0 1  Down, Depressed, Hopeless 0 0 0 1  PHQ - 2 Score 0 0 0 2  Altered sleeping - - - 1  Tired, decreased energy - - - 1  Change in appetite - - - 0  Feeling bad or failure about yourself  - - - 2  Trouble concentrating - - - 0  Moving slowly or fidgety/restless - - - 0  Suicidal thoughts - - - 0  PHQ-9 Score - - - 6    Review of Systems  Gastrointestinal: Positive for constipation.  All other systems reviewed and are negative.      Objective:   Physical Exam  Constitutional: He is oriented to person, place, and time. He appears well-developed and well-nourished.  HENT:  Head: Normocephalic and atraumatic.  Neck: Normal range of motion. Neck supple.  Cardiovascular: Normal rate and regular rhythm.   Pulmonary/Chest: Effort normal and breath sounds normal.  Musculoskeletal:  Normal Muscle Bulk and Muscle Testing Reveals: Upper Extremities: Decreased ROM 90 Degrees and Muscle Strength 5/5 Lumbar  Paraspinal Tenderness: L-3-L-5 Bilateral Greater Trochanteric Tenderness Lower Extremities: Full ROM and Muscle Strength 5/5 Arises from chair slowly Antalgic Gait  Neurological: He is alert and oriented to person, place, and time.  Skin: Skin is warm and dry.  Psychiatric: He has a normal mood and affect.  Nursing note and vitals reviewed.         Assessment & Plan:  1. 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 He has lumbar degenerative disc disease Refilled: MS Contin 30 mg one tablet every 12 hours #60 and Oxycodone 10/325 mg one tablet every 6 hours as needed for pain #120.  2. Sacroiliac Dysfunction: Continue with current medication, heat and exercise regime  20  minutes of face to face patient care time was spent during this visit. All questions were encouraged and answered.  F/U in 1 month.

## 2015-06-14 LAB — TOXASSURE SELECT,+ANTIDEPR,UR: PDF: 0

## 2015-06-17 NOTE — Progress Notes (Signed)
Urine drug screen for this encounter is consistent for prescribed medication 

## 2015-07-08 ENCOUNTER — Ambulatory Visit (HOSPITAL_BASED_OUTPATIENT_CLINIC_OR_DEPARTMENT_OTHER): Payer: Federal, State, Local not specified - PPO | Admitting: Physical Medicine & Rehabilitation

## 2015-07-08 ENCOUNTER — Encounter: Payer: Self-pay | Admitting: Physical Medicine & Rehabilitation

## 2015-07-08 ENCOUNTER — Encounter: Payer: Medicare Other | Attending: Physical Medicine & Rehabilitation

## 2015-07-08 VITALS — BP 122/66 | HR 63 | Resp 14

## 2015-07-08 DIAGNOSIS — M79605 Pain in left leg: Secondary | ICD-10-CM | POA: Diagnosis not present

## 2015-07-08 DIAGNOSIS — M961 Postlaminectomy syndrome, not elsewhere classified: Secondary | ICD-10-CM

## 2015-07-08 DIAGNOSIS — G8928 Other chronic postprocedural pain: Secondary | ICD-10-CM | POA: Insufficient documentation

## 2015-07-08 DIAGNOSIS — M545 Low back pain: Secondary | ICD-10-CM | POA: Insufficient documentation

## 2015-07-08 DIAGNOSIS — M79604 Pain in right leg: Secondary | ICD-10-CM | POA: Insufficient documentation

## 2015-07-08 DIAGNOSIS — M533 Sacrococcygeal disorders, not elsewhere classified: Secondary | ICD-10-CM

## 2015-07-08 DIAGNOSIS — Z981 Arthrodesis status: Secondary | ICD-10-CM | POA: Insufficient documentation

## 2015-07-08 MED ORDER — OXYCODONE-ACETAMINOPHEN 10-325 MG PO TABS: 1.0000 | ORAL_TABLET | Freq: Four times a day (QID) | ORAL | Status: DC | PRN

## 2015-07-08 MED ORDER — MORPHINE SULFATE ER 30 MG PO TBCR: 30.0000 mg | EXTENDED_RELEASE_TABLET | Freq: Two times a day (BID) | ORAL | Status: DC

## 2015-07-08 NOTE — Progress Notes (Signed)
Subjective:    Patient ID: Samuel [name], male    DOB: [birth year], 40 y.o.   MRN: [id number]  Chronic low back pain underwent conservative care including injections, physical therapy, medication management prior to first lumbar surgery which was performed in in 2010. Had a L3-L4 fusion by Dr. [name].  Had continued pain and obtained a second neurosurgical consultation with Dr. [name], underwent L3-S1 fusion in January 2015. Continue to have postoperative pain. Has been on narcotic analgesicsSince 2011. Initially started out on hydrocodone and then was switched to morphine and oxycodone.  Had benefit from Sacroiliac injections but didn't tolerate SI RF HPI Using stool softeners once a day 200mg  docusate per day No falls or trauma. We discussed the attempted sacroiliac radiofrequency. He thought it may be helpful for them but is wondering if there is a way to do it so it doesn't hurt as much. We discussed that certain clinics will do these procedures under conscious sedation.  We also discussed his activity level which is very poor. He sits on a heating pad for prolonged periods of time  Patient feels like he is not really getting better. He is over 2 years postoperative. He is still open to other treatment alternatives besides medications only.  Pain Inventory Average Pain 6 Pain Right Now 6 My pain is constant, sharp, dull and aching  In the last 24 hours, has pain interfered with the following? General activity 8 Relation with others 8 Enjoyment of life 9 What TIME of day is your pain at its worst? morning, evening, night Sleep (in general) Fair  Pain is worse with: walking, bending, sitting, standing and some activites Pain improves with: rest and medication Relief from Meds: 3  Mobility walk without assistance walk with assistance how many minutes can you walk? 5 ability to climb steps?  no do you drive?  yes use a wheelchair Do you have any goals in this area?   no  Function disabled: date disabled . I need assistance with the following:  dressing, bathing, meal prep, household duties and shopping Do you have any goals in this area?  no  Neuro/Psych weakness trouble walking spasms depression  Prior Studies Any changes since last visit?  no  Physicians involved in your care Any changes since last visit?  no   No family history on file. Social History   Social History  . Marital Status: Married    Spouse Name: N/A  . Number of Children: N/A  . Years of Education: N/A   Social History Main Topics  . Smoking status: Never Smoker   . Smokeless tobacco: None  . Alcohol Use: Yes     Comment: very rare (not since son was born)  . Drug Use: No  . Sexual Activity: Not Asked   Other Topics Concern  . None   Social History Narrative   Past Surgical History  Procedure Laterality Date  . Back surgery      lower back surgery x 2   Past Medical History  Diagnosis Date  . GERD (gastroesophageal reflux disease)   . Headache(784.0)   . Arthritis     "some in my back"   BP 122/66 mmHg  Pulse 63  Resp 14  SpO2 100%  Opioid Risk Score:   Fall Risk Score:  `1  Depression screen PHQ 2/9  Depression screen [facility] 2/9 06/06/2015 03/04/2015 02/04/2015 11/01/2014  Decreased Interest 0 0 0 1  Down, Depressed, Hopeless 0 0 0 1  PHQ -  2 Score 0 0 0 2  Altered sleeping - - - 1  Tired, decreased energy - - - 1  Change in appetite - - - 0  Feeling bad or failure about yourself  - - - 2  Trouble concentrating - - - 0  Moving slowly or fidgety/restless - - - 0  Suicidal thoughts - - - 0  PHQ-9 Score - - - 6     Review of Systems  All other systems reviewed and are negative.      Objective:   Physical Exam  Constitutional: He is oriented to person, place, and time. He appears well-developed and well-nourished.  HENT:  Head: Normocephalic and atraumatic.  Eyes: Conjunctivae and EOM are normal. Pupils are equal, round, and reactive  to light.  Neck: Normal range of motion.  Musculoskeletal:       Lumbar back: He exhibits decreased range of motion, tenderness and deformity.  Flat back, healed midline incision  Neurological: He is alert and oriented to person, place, and time. He has normal strength.  Negative straight leg raising test  Psychiatric: He has a normal mood and affect.  Nursing note and vitals reviewed.   Lumbar flexion 0-25% Lumbar extension to neutral only Discoloration of skin over the low back area from heating pad.     Assessment & Plan:   1.  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 We also discussed reducing his heating pad usage. Trial aquatic therapyTo help with mobility  Continue morphine sulfate 30 mg twice a day Continue oxycodone 10 mg 4 times a day  2. 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 This produced a 50% relief on 2 occasions. I do think he is a good candidate for radiofrequency of the sacroiliac joint however he does not tolerate this. We'll make referral to a center which provides IV sedation for this procedure.  Return to clinic one month

## 2015-07-08 NOTE — Patient Instructions (Signed)
[facility] [address] [phone] Aquatic therapy, please progress to land-based therapy. 2-3 times per week x3 weeks 

## 2015-08-05 ENCOUNTER — Encounter: Payer: Medicare Other | Attending: Physical Medicine & Rehabilitation

## 2015-08-05 ENCOUNTER — Ambulatory Visit (HOSPITAL_BASED_OUTPATIENT_CLINIC_OR_DEPARTMENT_OTHER): Payer: Medicare Other | Admitting: Physical Medicine & Rehabilitation

## 2015-08-05 ENCOUNTER — Encounter: Payer: Self-pay | Admitting: Physical Medicine & Rehabilitation

## 2015-08-05 VITALS — BP 126/65 | HR 62 | Resp 14

## 2015-08-05 DIAGNOSIS — M533 Sacrococcygeal disorders, not elsewhere classified: Secondary | ICD-10-CM | POA: Diagnosis not present

## 2015-08-05 DIAGNOSIS — M79604 Pain in right leg: Secondary | ICD-10-CM | POA: Diagnosis not present

## 2015-08-05 DIAGNOSIS — G8928 Other chronic postprocedural pain: Secondary | ICD-10-CM | POA: Insufficient documentation

## 2015-08-05 DIAGNOSIS — Z981 Arthrodesis status: Secondary | ICD-10-CM | POA: Insufficient documentation

## 2015-08-05 DIAGNOSIS — M961 Postlaminectomy syndrome, not elsewhere classified: Secondary | ICD-10-CM | POA: Diagnosis not present

## 2015-08-05 DIAGNOSIS — M79605 Pain in left leg: Secondary | ICD-10-CM | POA: Insufficient documentation

## 2015-08-05 DIAGNOSIS — M545 Low back pain: Secondary | ICD-10-CM | POA: Insufficient documentation

## 2015-08-05 MED ORDER — MORPHINE SULFATE ER 30 MG PO TBCR: 30.0000 mg | EXTENDED_RELEASE_TABLET | Freq: Two times a day (BID) | ORAL | Status: DC

## 2015-08-05 MED ORDER — OXYCODONE-ACETAMINOPHEN 10-325 MG PO TABS: 1.0000 | ORAL_TABLET | Freq: Four times a day (QID) | ORAL | Status: DC | PRN

## 2015-08-05 NOTE — Progress Notes (Signed)
Subjective:    Patient ID: Samuel [name], male    DOB: 02-17-1976, 40 y.o.   MRN: [id number]  HPI Has appointment with Dr. Erin Sons to April 13 for radiofrequency procedure under IV sedation Cont to have Chronic low back pain mainly at the lower aspect of his fusion.  Patient without new complaints. He was working on his wife's car he is okay when he lays flat but cannot bend over very well. Pain Inventory Average Pain 6 Pain Right Now 7 My pain is constant, sharp, dull, stabbing and aching  In the last 24 hours, has pain interfered with the following? General activity 9 Relation with others 9 Enjoyment of life 9 What TIME of day is your pain at its worst? morning, evening, night  Sleep (in general) Fair  Pain is worse with: walking, bending, sitting and standing Pain improves with: rest and medication Relief from Meds: 5  Mobility walk without assistance how many minutes can you walk? 5 ability to climb steps?  yes do you drive?  yes Do you have any goals in this area?  no  Function disabled: date disabled . I need assistance with the following:  dressing, bathing, meal prep, household duties and shopping Do you have any goals in this area?  no  Neuro/Psych weakness spasms depression  Prior Studies Any changes since last visit?  no  Physicians involved in your care Any changes since last visit?  no   History reviewed. No pertinent family history. Social History   Social History  . Marital Status: Married    Spouse Name: N/A  . Number of Children: N/A  . Years of Education: N/A   Social History Main Topics  . Smoking status: Never Smoker   . Smokeless tobacco: None  . Alcohol Use: Yes     Comment: very rare (not since son was born)  . Drug Use: No  . Sexual Activity: Not Asked   Other Topics Concern  . None   Social History Narrative   Past Surgical History  Procedure Laterality Date  . Back surgery      lower back surgery x 2   Past  Medical History  Diagnosis Date  . GERD (gastroesophageal reflux disease)   . Headache(784.0)   . Arthritis     "some in my back"   BP 126/65 mmHg  Pulse 62  Resp 14  SpO2 97%  Opioid Risk Score:   Fall Risk Score:  `1  Depression screen PHQ 2/9  Depression screen [facility] 2/9 08/05/2015 06/06/2015 03/04/2015 02/04/2015 11/01/2014  Decreased Interest 1 0 0 0 1  Down, Depressed, Hopeless 1 0 0 0 1  PHQ - 2 Score 2 0 0 0 2  Altered sleeping 2 - - - 1  Tired, decreased energy 2 - - - 1  Change in appetite 1 - - - 0  Feeling bad or failure about yourself  2 - - - 2  Trouble concentrating 1 - - - 0  Moving slowly or fidgety/restless 0 - - - 0  Suicidal thoughts 0 - - - 0  PHQ-9 Score 10 - - - 6  Difficult doing work/chores Somewhat difficult - - - -     Review of Systems  Gastrointestinal: Positive for constipation.  All other systems reviewed and are negative.      Objective:   Physical Exam  Skin discoloration from use of heating pad. Motor strength 5/5 bilateral deltoids biceps triceps grip hip flexor and extensor strengthare  Negative SLR Calloused hands Patient has limited lumbar range of motion with flexion extension lateral bending and rotation. His pain is mainly at the belt line and below His range of motion is approximately 25% of normal He has tenderness at the lumbo sacral paraspinals at the lower aspect of his incision    Assessment & Plan:  1. Lumbar post laminectomy syndrome status post L3-S1 fusion. Likely has sacroiliac dysfunction and has responded well short-term to sacroiliac injections but could not tolerate Sacroiliac radiofrequency procedure. He is being referred for Sacroiliac radiofrequency with IV sedation.  Continue current pain medications hopefully will be able to reduce them after successful radiofrequency Currently on MS Contin 30 mg twice a day Oxycodone 10 mg 4 times a day Follow-up with nurse practitioner in one month, Would anticipate going  down on oxycodone 10mg  tid  Discussed with the patient agrees with plan

## 2015-08-05 NOTE — Patient Instructions (Signed)
Plan would be to reduce oxycodone to 3 times a day for a month then 2 times a day for month and once a day for month and discontinue, After successful sacroiliac radiofrequency

## 2015-09-03 ENCOUNTER — Encounter: Payer: PPO | Attending: Physical Medicine & Rehabilitation | Admitting: Registered Nurse

## 2015-09-03 ENCOUNTER — Encounter: Payer: Self-pay | Admitting: Registered Nurse

## 2015-09-03 VITALS — BP 132/69 | HR 63 | Resp 14

## 2015-09-03 DIAGNOSIS — G8929 Other chronic pain: Secondary | ICD-10-CM

## 2015-09-03 DIAGNOSIS — Z79899 Other long term (current) drug therapy: Secondary | ICD-10-CM

## 2015-09-03 DIAGNOSIS — M5416 Radiculopathy, lumbar region: Secondary | ICD-10-CM

## 2015-09-03 DIAGNOSIS — G894 Chronic pain syndrome: Secondary | ICD-10-CM

## 2015-09-03 DIAGNOSIS — M545 Low back pain: Secondary | ICD-10-CM | POA: Insufficient documentation

## 2015-09-03 DIAGNOSIS — M79605 Pain in left leg: Secondary | ICD-10-CM | POA: Diagnosis not present

## 2015-09-03 DIAGNOSIS — M533 Sacrococcygeal disorders, not elsewhere classified: Secondary | ICD-10-CM

## 2015-09-03 DIAGNOSIS — Z981 Arthrodesis status: Secondary | ICD-10-CM | POA: Diagnosis not present

## 2015-09-03 DIAGNOSIS — M62830 Muscle spasm of back: Secondary | ICD-10-CM

## 2015-09-03 DIAGNOSIS — M79604 Pain in right leg: Secondary | ICD-10-CM | POA: Insufficient documentation

## 2015-09-03 DIAGNOSIS — M961 Postlaminectomy syndrome, not elsewhere classified: Secondary | ICD-10-CM | POA: Diagnosis not present

## 2015-09-03 DIAGNOSIS — G8928 Other chronic postprocedural pain: Secondary | ICD-10-CM | POA: Diagnosis not present

## 2015-09-03 DIAGNOSIS — Z5181 Encounter for therapeutic drug level monitoring: Secondary | ICD-10-CM

## 2015-09-03 MED ORDER — OXYCODONE-ACETAMINOPHEN 10-325 MG PO TABS: 1.0000 | ORAL_TABLET | Freq: Four times a day (QID) | ORAL | Status: DC | PRN

## 2015-09-03 MED ORDER — MORPHINE SULFATE ER 30 MG PO TBCR: 30.0000 mg | EXTENDED_RELEASE_TABLET | Freq: Two times a day (BID) | ORAL | Status: DC

## 2015-09-03 NOTE — Progress Notes (Signed)
Subjective:    Patient ID: Samuel [name], male    DOB: 1975/09/02, 40 y.o.   MRN: [id number]  HPI: Samuel [name] is a 40 year old male who returns for follow up for chronic pain and medication refill. He states his pain is located in his lower back radiating into bilateral lower extremities laterally.He rates his pain 6. His current exercise regime is performing stretching exercises and walking short distances.  Samuel [name] has an appointment with Dr. Dossie Arbour on April 13,2017.  Pain Inventory Average Pain 6 Pain Right Now 6 My pain is dull, stabbing and aching  In the last 24 hours, has pain interfered with the following? General activity 9 Relation with others 9 Enjoyment of life 9 What TIME of day is your pain at its worst? morning, evening, night  Sleep (in general) Fair  Pain is worse with: walking, bending and standing Pain improves with: rest and medication Relief from Meds: 4  Mobility walk without assistance how many minutes can you walk? 5 ability to climb steps?  yes do you drive?  yes Do you have any goals in this area?  no  Function disabled: date disabled . I need assistance with the following:  bathing and household duties Do you have any goals in this area?  no  Neuro/Psych weakness trouble walking spasms depression  Prior Studies Any changes since last visit?  no  Physicians involved in your care Any changes since last visit?  no   History reviewed. No pertinent family history. Social History   Social History  . Marital Status: Married    Spouse Name: N/A  . Number of Children: N/A  . Years of Education: N/A   Social History Main Topics  . Smoking status: Never Smoker   . Smokeless tobacco: None  . Alcohol Use: Yes     Comment: very rare (not since son was born)  . Drug Use: No  . Sexual Activity: Not Asked   Other Topics Concern  . None   Social History Narrative   Past Surgical History  Procedure Laterality Date  . Back  surgery      lower back surgery x 2   Past Medical History  Diagnosis Date  . GERD (gastroesophageal reflux disease)   . Headache(784.0)   . Arthritis     "some in my back"   BP 132/69 mmHg  Pulse 63  Resp 14  SpO2 98%  Opioid Risk Score:   Fall Risk Score:  `1  Depression screen PHQ 2/9  Depression screen [facility] 2/9 08/05/2015 06/06/2015 03/04/2015 02/04/2015 11/01/2014  Decreased Interest 1 0 0 0 1  Down, Depressed, Hopeless 1 0 0 0 1  PHQ - 2 Score 2 0 0 0 2  Altered sleeping 2 - - - 1  Tired, decreased energy 2 - - - 1  Change in appetite 1 - - - 0  Feeling bad or failure about yourself  2 - - - 2  Trouble concentrating 1 - - - 0  Moving slowly or fidgety/restless 0 - - - 0  Suicidal thoughts 0 - - - 0  PHQ-9 Score 10 - - - 6  Difficult doing work/chores Somewhat difficult - - - -     Review of Systems  All other systems reviewed and are negative.      Objective:   Physical Exam  Constitutional: He is oriented to person, place, and time. He appears well-developed and well-nourished.  HENT:  Head: Normocephalic  and atraumatic.  Neck: Normal range of motion. Neck supple.  Cardiovascular: Normal rate and regular rhythm.   Pulmonary/Chest: Effort normal and breath sounds normal.  Musculoskeletal:  Normal Muscle Bulk and Muscle Testing Reveals: Upper Extremities: Full ROM and Muscle Strength 5/5 Lumbar Paraspinal Tenderness: L-3- L-5 Lower Extremities: Full ROM and Muscle Strength 5/5 Left Lower Extremity Flexion Produces Pain into Lumbar Arises from chair slowly Antalgic Gait   Neurological: He is alert and oriented to person, place, and time.  Skin: Skin is warm and dry.  Psychiatric: He has a normal mood and affect.  Nursing note and vitals reviewed.         Assessment & Plan:  1. 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 He has lumbar degenerative disc disease Refilled: MS Contin 30 mg one tablet every 12 hours #60 and  Oxycodone 10/325 mg one tablet every 6 hours as needed for pain #120.  We will continue the opioid monitoring program, this consists of regular clinic visits, examinations, urine drug screen, pill counts as well as use of [place] Controlled Substance reporting System. Has an appointment with Dr. Consuela Mimes on 09/05/15. 2. Sacroiliac Dysfunction: Continue with current medication, heat and exercise regime  20 minutes of face to face patient care time was spent during this visit. All questions were encouraged and answered.  F/U in 1 month.

## 2015-09-05 ENCOUNTER — Encounter: Payer: Self-pay | Admitting: Pain Medicine

## 2015-09-05 ENCOUNTER — Ambulatory Visit: Payer: PPO | Attending: Pain Medicine | Admitting: Pain Medicine

## 2015-09-05 VITALS — BP 143/67 | HR 62 | Temp 96.8°F | Resp 18 | Ht 73.0 in | Wt 320.0 lb

## 2015-09-05 DIAGNOSIS — M542 Cervicalgia: Secondary | ICD-10-CM | POA: Insufficient documentation

## 2015-09-05 DIAGNOSIS — M545 Low back pain: Secondary | ICD-10-CM

## 2015-09-05 DIAGNOSIS — M549 Dorsalgia, unspecified: Secondary | ICD-10-CM | POA: Diagnosis not present

## 2015-09-05 DIAGNOSIS — M47896 Other spondylosis, lumbar region: Secondary | ICD-10-CM | POA: Insufficient documentation

## 2015-09-05 DIAGNOSIS — Z79891 Long term (current) use of opiate analgesic: Secondary | ICD-10-CM | POA: Insufficient documentation

## 2015-09-05 DIAGNOSIS — F1190 Opioid use, unspecified, uncomplicated: Secondary | ICD-10-CM

## 2015-09-05 DIAGNOSIS — M5442 Lumbago with sciatica, left side: Secondary | ICD-10-CM

## 2015-09-05 DIAGNOSIS — Z0189 Encounter for other specified special examinations: Secondary | ICD-10-CM | POA: Insufficient documentation

## 2015-09-05 DIAGNOSIS — Z79899 Other long term (current) drug therapy: Secondary | ICD-10-CM

## 2015-09-05 DIAGNOSIS — M533 Sacrococcygeal disorders, not elsewhere classified: Secondary | ICD-10-CM | POA: Insufficient documentation

## 2015-09-05 DIAGNOSIS — M5416 Radiculopathy, lumbar region: Secondary | ICD-10-CM

## 2015-09-05 DIAGNOSIS — G8929 Other chronic pain: Secondary | ICD-10-CM | POA: Insufficient documentation

## 2015-09-05 DIAGNOSIS — M47816 Spondylosis without myelopathy or radiculopathy, lumbar region: Secondary | ICD-10-CM | POA: Insufficient documentation

## 2015-09-05 DIAGNOSIS — K219 Gastro-esophageal reflux disease without esophagitis: Secondary | ICD-10-CM | POA: Insufficient documentation

## 2015-09-05 DIAGNOSIS — R51 Headache: Secondary | ICD-10-CM | POA: Diagnosis not present

## 2015-09-05 DIAGNOSIS — M4726 Other spondylosis with radiculopathy, lumbar region: Secondary | ICD-10-CM

## 2015-09-05 DIAGNOSIS — M79606 Pain in leg, unspecified: Secondary | ICD-10-CM | POA: Diagnosis not present

## 2015-09-05 DIAGNOSIS — M961 Postlaminectomy syndrome, not elsewhere classified: Secondary | ICD-10-CM

## 2015-09-05 DIAGNOSIS — Z9889 Other specified postprocedural states: Secondary | ICD-10-CM | POA: Insufficient documentation

## 2015-09-05 DIAGNOSIS — M539 Dorsopathy, unspecified: Secondary | ICD-10-CM

## 2015-09-05 MED ORDER — KETOROLAC TROMETHAMINE 60 MG/2ML IM SOLN
INTRAMUSCULAR | Status: AC
  Administered 2015-09-05: 60 mg via INTRAMUSCULAR
  Filled 2015-09-05: qty 2

## 2015-09-05 MED ORDER — KETOROLAC TROMETHAMINE 60 MG/2ML IM SOLN: 60.0000 mg | Freq: Once | INTRAMUSCULAR | Status: AC

## 2015-09-05 MED ORDER — ORPHENADRINE CITRATE 30 MG/ML IJ SOLN: 60.0000 mg | Freq: Once | INTRAMUSCULAR | Status: DC

## 2015-09-05 MED ORDER — ORPHENADRINE CITRATE 30 MG/ML IJ SOLN
INTRAMUSCULAR | Status: AC
  Administered 2015-09-05: 60 mg via INTRAMUSCULAR
  Filled 2015-09-05: qty 2

## 2015-09-05 NOTE — Progress Notes (Signed)
Safety precautions to be maintained throughout the outpatient stay will include: orient to surroundings, keep bed in low position, maintain call bell within reach at all times, provide assistance with transfer out of bed and ambulation.  

## 2015-09-05 NOTE — Progress Notes (Signed)
Patient's Name: Samuel [name]  Patient type: New patient  MRN: [id number]  Service setting: Ambulatory outpatient  DOB: [birth year]  Location: [facility]  DOS: 09/05/2015  Primary Care Physician: Pcp Not In System  Note by: Huey Scalia A. Dossie Arbour, M.D, DABA, DABAPM, DABPM, DABIPP, FIPP  Referring Physician: Charlett Blake, MD  Specialty: Board-Certified Interventional Pain Management     Primary Reason(s) for Visit: Initial Patient Evaluation CC: Back Pain   HPI  [name] is a 40 y.o. year old, male patient, who comes today for an initial evaluation. He has Lumbar DDD; Lumbar post-laminectomy syndrome; Chronic lumbar radicular pain (Location of Secondary source of pain) (Bilateral) (R>L) (L5 dermatome); Sacroiliac dysfunction; Lumbar facet syndrome (Location of Primary Source of Pain) (Bilateral) (R>L); Chronic low back pain (Location of Primary Source of Pain) (Bilateral) (R>L); Lumbar spondylosis; Chronic pain; Long term current use of opiate analgesic; Long term prescription opiate use; Opiate use (120 MME/Day); Encounter for pain management planning; Chronic lower extremity pain (Location of Secondary source of pain) (Bilateral) (R>L); and 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 His primarily concern today is the Back Pain   Pain Assessment: Self-Reported Pain Score: 8  Reported level is compatible with observation Pain Type: Chronic pain Pain Location: Back Pain Orientation: Lower Pain Descriptors / Indicators: Aching, Throbbing Pain Frequency: Constant  Onset and Duration: Gradual, Date of onset: Approximately 8 years ago and Present longer than 3 months Cause of pain: Unknown Severity: No change since onset, NAS-11 at its worse: 8/10, NAS-11 at its best: 6/10, NAS-11 now: 7/10 and NAS-11 on the average: 7/10 Timing: Morning,  Afternoon, Evening, Night, During activity or exercise and After activity or exercise Aggravating Factors: Bending, Climbing, Intercourse (sex), Kneeling, Lifiting, Motion, Prolonged sitting, Prolonged standing, Squatting, Stooping , Twisting, Walking, Walking uphill, Walking downhill and Working Alleviating Factors: Medications, Resting and Sleeping Associated Problems: Constipation, Depression, Spasms, Weakness and Pain that wakes patient up Quality of Pain: Aching, Constant, Disabling, Dull, Sharp, Shooting and Stabbing Previous Examinations or Tests: Discogram, MRI scan and X-rays Previous Treatments: Narcotic medications  The patient comes into the clinics today referred to [place] specifically for a bilateral lumbar facet radiofrequency ablation under fluoroscopic guidance and IV sedation. The patient has a history that is significant for low back pain and leg pain. The patient indicates that his primary pain is that of the lower back with the right side being worst on the left. His secondary area pain is that of the legs with the right also being worst on the left. Indicators of the right lower extremity the pain goes all the way down to the top of the foot in what seems to be an L5 dermatomal distribution. The same is true for the left lower extremity but not to the extent of the right. The patient also has a history significant for a failed back surgery syndrome 3. The first 2 surgeries were done by Dr. Deri Fuelling and the third one was done by Dr. [name]. Patient has a laminectomy with extensive lumbar spine interbody fusion from L3-S1 using bilateral pedicle screws at each level.  Historic Controlled Substance Pharmacotherapy Review  Currently Prescribed Analgesic: Morphine ER (MS Contin) 30 mg every 12 hours (60 mg/day) + oxycodone/APAP 10/325 one every 6 hours (40 mg/day) MME/day: 120 mg/day Pharmacodynamics: Analgesic Effect: More than 50% Activity Facilitation: Medication(s) allow patient  to  sit, stand, walk, and do the basic ADLs Perceived Effectiveness: Described as relatively effective, allowing for increase in activities of daily living (ADL) Side-effects or Adverse reactions: None reported Historical Background Evaluation: [place] PDMP: Five (5) year initial data search conducted. No abnormal patterns identified [place] [employer] Offender Public Information: Non-contributory Historical Hospital-associated UDS Results:   Lab Results  Component Value Date   THCU NEG 02/04/2015   COCAINSCRNUR NEG 02/04/2015   MDMA NEG 02/04/2015   AMPHETMU NEG 02/04/2015   UDS Results: No UDS results available at this time UDS Interpretation: N/A Medication Assessment Form: Not applicable. Initial evaluation. The patient has not received any medications from our practice Treatment compliance: Not applicable. Initial evaluation Risk Assessment: Aberrant Behavior: None observed or detected today Opioid Fatal Overdose Risk Factors: Male gender, Age 42-43 years old, Caucasian and High daily dosage Non-fatal overdose hazard ratio (HR): 8.87 for 100-199 MME/day Fatal overdose hazard ratio (HR): 2.04 for doses equal to, or higher than 100 MME/day Substance Use Disorder (SUD) Risk Level: The patient is not here for medication management and therefore we will not be sending him to the medical psychology evaluation for substance use disorder. Opioid Risk Tool (ORT) Score: Total Score: 0 Low Risk for SUD (Score <3) Depression Scale Score: PHQ-2: PHQ-2 Total Score: 0 No depression (0) PHQ-9: PHQ-9 Total Score: 0 No depression (0-4)  Pharmacologic Plan: We will not be taking over the patient's medications.  Meds  The patient has a current medication list which includes the following prescription(s): gabapentin, ibuprofen, morphine, OVER THE COUNTER MEDICATION, oxycodone-acetaminophen, and methocarbamol, and the following Facility-Administered Medications: ketorolac and  orphenadrine.  ROS  Cardiovascular History: Negative for hypertension, coronary artery diseas, myocardial infraction, anticoagulant therapy or heart failure Pulmonary or Respiratory History: Snoring  Neurological History: Negative for epilepsy, stroke, urinary or fecal inontinence, spina bifida or tethered cord syndrome Psychological-Psychiatric History: Negative for anxiety, depression, schizophrenia, bipolar disorders or suicidal ideations or attempts Gastrointestinal History: Negative for peptic ulcer disease, hiatal hernia, GERD, IBS, hepatitis, cirrhosis or pancreatitis Genitourinary History: Negative for nephrolithiasis, hematuria, renal failure or chronic kidney disease Hematological History: Negative for anticoagulant therapy, anemia, bruising or bleeding easily, hemophilia, sickle cell disease or trait, thrombocytopenia or coagulupathies Endocrine History: Negative for diabetes or thyroid disease Rheumatologic History: Negative for lupus, osteoarthritis, rheumatoid arthritis, myositis, polymyositis or fibromyagia Musculoskeletal History: Negative for myasthenia gravis, muscular dystrophy, multiple sclerosis or malignant hyperthermia Work History: Disabled  Allergies  Mr. Felgar has No Known Allergies.  [place]  Medical:  Mr. Dittoe  has a past medical history of GERD (gastroesophageal reflux disease); Headache(784.0); and Arthritis. Family: family history includes Hypertension in his father. Surgical:  has past surgical history that includes Back surgery. Tobacco:  reports that he has never smoked. He does not have any smokeless tobacco history on file. Alcohol:  reports that he does not drink alcohol. Drug:  reports that he does not use illicit drugs. Active Ambulatory Problems    Diagnosis Date Noted  . Lumbar DDD 06/12/2013  . Lumbar post-laminectomy syndrome 04/05/2014  . Chronic lumbar radicular pain (Location of Secondary source of pain) (Bilateral) (R>L) (L5 dermatome)  04/05/2014  . Sacroiliac dysfunction 04/05/2014  . Lumbar facet syndrome (Location of Primary Source of Pain) (Bilateral) (R>L) 09/05/2015  . Chronic low back pain (Location of Primary Source of Pain) (Bilateral) (R>L) 09/05/2015  . Lumbar spondylosis 09/05/2015  . Chronic pain 09/05/2015  . Long term current use of opiate analgesic 09/05/2015  . Long term  prescription opiate use 09/05/2015  . Opiate use (120 MME/Day) 09/05/2015  . Encounter for pain management planning 09/05/2015  . Chronic lower extremity pain (Location of Secondary source of pain) (Bilateral) (R>L) 09/05/2015  . 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 No Resolved Ambulatory Problems   Past Medical History  Diagnosis Date  . GERD (gastroesophageal reflux disease)   . Headache(784.0)   . Arthritis     Physical Examination  Constitutional Vitals:  Today's Vitals   09/05/15 1521 09/05/15 1523 09/05/15 1525  BP:  143/67   Pulse: 62    Temp: 96.8 F (36 C)    Resp: 18    Height: 6\' 1"  (1.854 m)    Weight: 320 lb (145.151 kg)    SpO2: 98%    PainSc: 8  8  8    PainLoc: Back     Calculated BMI: Body mass index is 42.23 kg/(m^2). Extreme obesity (Class III) (>40 kg/m2) - 254% higher incidence of chronic pain General appearance: alert, cooperative, oriented, in moderate distress, moderately obese, well nourished and well hydrated Eyes: PERLA Respiratory: No evidence respiratory distress, no audible rales or ronchi and no use of accessory muscles of respiration Psych: Alert, oriented to person, oriented to place and oriented to time  Cervical Spine Exam  Inspection: Normal anatomy, no anomalies observed Cervical Lordosis: Normal Alignment: Symetrical Functional ROM: Within functional limits (WFL) AROM: WFL Sensory: No sensory  anomalies reported or detected  Upper Extremity Exam    Right  Left  Inspection: No gross anomalies detected  Inspection: No gross anomalies detected  Functional ROM: Adequate  Functional ROM: Adequate  AROM: Adequate  AROM: Adequate  Sensory: No sensory anomalies reported or detected  Sensory: No sensory anomalies reported or detected  Motor: Unremarkable  Motor: Unremarkable  Vascular: Normal skin color, temperature, and hair growth. No peripheral edema or cyanosis  Vascular: Normal skin color, temperature, and hair growth. No peripheral edema or cyanosis   Thoracic Spine  Inspection: No gross anomalies detected Alignment: Symetrical Functional ROM: Within functional limits [facility]) AROM: Adequate Palpation: WNL  Lumbar Spine  Inspection: Evidence of prior lumbar surgeries with well-healed scars. Alignment: Symetrical Functional ROM: Limited due to fusion AROM: Decreased due to fusion Sensory: No sensory anomalies reported or detected Palpation: Tender Provocative Tests: Lumbar Hyperextension and rotation test: deferred Patrick's Maneuver: deferred  Gait Assessment  Gait: Antalgic (limping)  Lower Extremities    Right  Left  Inspection: No gross anomalies detected  Inspection: No gross anomalies detected  Functional ROM: Within functional limits [facility])  Functional ROM: Within functional limits (WFL)  AROM: Adequate  AROM: Adequate  Sensory: No sensory anomalies reported or detected  Sensory: No sensory anomalies reported or detected  Motor: Unremarkable  Motor: Unremarkable  Toe walk (S1): WNL  Toe walk (S1): WNL  Heal walk (L5): WNL  Heal walk (L5): WNL   Assessment  Primary Diagnosis & Pertinent Problem List: The primary encounter diagnosis was Facet syndrome, lumbar. 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  Visit Diagnosis: 1. Facet syndrome, lumbar   2. Chronic low back pain   3. Osteoarthritis of spine with radiculopathy, lumbar region   4. Chronic pain   5. Long term current use of opiate analgesic   6. Long term prescription opiate use   7. Opiate use   8. Encounter for pain management planning   9. Chronic pain of lower extremity, unspecified laterality   10. 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 Chronic lumbar radicular pain (Location of Secondary source of pain) (Bilateral) (R>L) (L5 dermatome)     Assessment: Lumbar facet syndrome (Location of Primary Source of Pain) (Bilateral) (R>L) According to the patient he has had multiple diagnostic lumbar facet blocks, some of them done by Dr. Charlett Blake. In addition the patient indicates that [name] already attempted to do a radiofrequency of the lumbar spine but was unable to do it due to patient's movement. According to the patient he is being sent to all so that we can do it with some sedation. I made it clear to the patient that we will not be putting him to sleep since we do need him to provide [place] with some feedback. I also informed the patient of the risks and possible complications the limitations of the procedure. I have explained to him that I will do only one side at a time starting with the right side. I also explained to him that the lower extremity pain that he's experiencing is not likely to be secondary to the lumbar facets since  it does not follow a referred pattern. I have explained to the patient that it is very likely that the lower extremity pain is secondary to a bilateral L5 chronic radiculopathy/radiculitis.  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 I have explained to the patient that the success rate of doing radiofrequency with hardware is very low. I have agreed to give it a try, but I do not have my hopes high.  Chronic lumbar radicular pain (Location of Secondary source of pain) (Bilateral) (R>L) (L5 dermatome) The pain pattern followed by the patient's lower extremity pain is that of an L5 dermatome and not necessarily that of referred pain. This would suggest that the patient has a component of the pain that will not go away with the radiofrequency. I have warned the patient that he will have no significant benefit in terms of the lower extremity pain from the radiofrequency.  Opiate use (120 MME/Day) This patient's chronic pain medication management will continue to be handled by his primary pain physician Dr. Charlett Blake.   Plan of Care  Initial Treatment Plan:  Please be advised that as per protocol, today's visit has been an evaluation only. We have not taken over the patient's controlled substance management.  Problem List Items Addressed This Visit      High   Chronic low back pain (Location of Primary Source of Pain) (Bilateral) (R>L) (Chronic)   Relevant Medications   orphenadrine (NORFLEX) 30 MG/ML injection (  Completed)   ketorolac (TORADOL) 60 MG/2ML injection (Completed)   orphenadrine (NORFLEX) injection 60 mg   ketorolac (TORADOL) injection 60 mg   Chronic lower extremity pain (Location of Secondary source of pain)  (Bilateral) (R>L) (Chronic)   Chronic lumbar radicular pain (Location of Secondary source of pain) (Bilateral) (R>L) (L5 dermatome) (Chronic)    The pain pattern followed by the patient's lower extremity pain is that of an L5 dermatome and not necessarily that of referred pain. This would suggest that the patient has a component of the pain that will not go away with the radiofrequency. I have warned the patient that he will have no significant benefit in terms of the lower extremity pain from the radiofrequency.      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 I have explained to the patient that the success rate of doing radiofrequency with hardware is very low. I have agreed to give it a try, but I do not have my hopes high.      Relevant Medications   orphenadrine (NORFLEX) 30 MG/ML injection (Completed)   ketorolac (TORADOL) 60 MG/2ML injection (Completed)   orphenadrine (NORFLEX) injection 60 mg   ketorolac (TORADOL) injection 60 mg   Lumbar facet syndrome (Location of Primary Source of Pain) (Bilateral) (R>L) - Primary (Chronic)    According to the patient he has had multiple diagnostic lumbar facet blocks, some of them done by Dr. Charlett Blake. In addition the patient indicates that [name] already attempted to do a radiofrequency of the lumbar spine but was unable to do it due to patient's movement. According to the patient he is being sent to all so that we can do it with  some sedation. I made it clear to the patient that we will not be putting him to sleep since we do need him to provide [place] with some feedback. I also informed the patient of the risks and possible complications the limitations of the procedure. I have explained to him that I will do only one side at a time starting with the right side. I also explained to him that the lower extremity pain that he's experiencing is not likely to be secondary to the lumbar facets since it does not follow a referred pattern. I have explained to the patient that it is very likely that the lower extremity pain is secondary to a bilateral L5 chronic radiculopathy/radiculitis.      Relevant Medications   orphenadrine (NORFLEX) 30 MG/ML injection (Completed)   ketorolac (TORADOL) 60 MG/2ML injection (Completed)   orphenadrine (NORFLEX) injection 60 mg   ketorolac (TORADOL) injection 60 mg   Other Relevant Orders   Radiofrequency,Lumbar   Lumbar spondylosis (Chronic)   Relevant Medications   orphenadrine (NORFLEX) 30 MG/ML injection (Completed)   ketorolac (TORADOL) 60 MG/2ML injection (Completed)   orphenadrine (NORFLEX) injection 60 mg   ketorolac (TORADOL) injection 60 mg     Medium   Encounter for pain management planning   Long term current use of  opiate analgesic (Chronic)   Long term prescription opiate use (Chronic)   Opiate use (120 MME/Day) (Chronic)    This patient's chronic pain medication management will continue to be handled by his primary pain physician Dr. Charlett Blake.         Pharmacotherapy (Medications Ordered): Meds ordered this encounter  Medications  . orphenadrine (NORFLEX) 30 MG/ML injection    Sig:     Donneta Romberg, Dena: cabinet override  . ketorolac (TORADOL) 60 MG/2ML injection    Sig:     Donneta Romberg, Dena: cabinet override  . orphenadrine (NORFLEX) injection 60 mg    Sig:   . ketorolac (TORADOL) injection 60 mg    Sig:     Lab-work & Procedure Ordered: Orders Placed  This Encounter  Procedures  . Radiofrequency,Lumbar    Imaging Ordered: None  Interventional Therapies: Scheduled: Right-sided lumbar facet radiofrequency ablation under fluoroscopic guidance and IV sedation. Considering: Bilateral lumbar facet radiofrequency. PRN Procedures: None at this time.   Referral(s) or Consult(s): The patient will not be sent to a medical psychology evaluation since we are not taking over his medication regimen.  Medications administered during this visit: We administered orphenadrine and ketorolac.  Prescriptions ordered during this visit: New Prescriptions   No medications on file    Future Appointments Date Time Provider [place]  10/07/2015 10:00 AM Bayard Hugger, NP [facility]

## 2015-09-05 NOTE — Assessment & Plan Note (Signed)
The pain pattern followed by the patient's lower extremity pain is that of an L5 dermatome and not necessarily that of referred pain. This would suggest that the patient has a component of the pain that will not go away with the radiofrequency. I have warned the patient that he will have no significant benefit in terms of the lower extremity pain from the radiofrequency.

## 2015-09-05 NOTE — Assessment & Plan Note (Signed)
According to the patient he has had multiple diagnostic lumbar facet blocks, some of them done by Dr. Charlett Blake. In addition the patient indicates that [name] already attempted to do a radiofrequency of the lumbar spine but was unable to do it due to patient's movement. According to the patient he is being sent to all so that we can do it with some sedation. I made it clear to the patient that we will not be putting him to sleep since we do need him to provide [place] with some feedback. I also informed the patient of the risks and possible complications the limitations of the procedure. I have explained to him that I will do only one side at a time starting with the right side. I also explained to him that the lower extremity pain that he's experiencing is not likely to be secondary to the lumbar facets since it does not follow a referred pattern. I have explained to the patient that it is very likely that the lower extremity pain is secondary to a bilateral L5 chronic radiculopathy/radiculitis.

## 2015-09-05 NOTE — Assessment & Plan Note (Signed)
Today I reviewed the medical record including the images from prior procedures and it is clear that there is some extensive hardware in the lumbar region. I have explained to the patient that the success rate of doing radiofrequency with hardware is very low. I have agreed to give it a try, but I do not have my hopes high.

## 2015-09-05 NOTE — Patient Instructions (Signed)
Radiofrequency Lesioning Radiofrequency lesioning is a procedure that is performed to relieve pain. The procedure is often used for back, neck, or arm pain. Radiofrequency lesioning involves the use of a machine that creates radio waves to make heat. During the procedure, the heat is applied to the nerve that carries the pain signal. The heat damages the nerve and interferes with the pain signal. Pain relief usually lasts for 6 months to 1 year. LET YOUR HEALTH CARE PROVIDER KNOW ABOUT:  Any allergies you have.  All medicines you are taking, including vitamins, herbs, eye drops, creams, and over-the-counter medicines.  Previous problems you or members of your family have had with the use of anesthetics.  Any blood disorders you have.  Previous surgeries you have had.  Any medical conditions you have.  Whether you are pregnant or may be pregnant. RISKS AND COMPLICATIONS Generally, this is a safe procedure. However, problems may occur, including:  Pain or soreness at the injection site.  Infection at the injection site.  Damage to nerves or blood vessels. BEFORE THE PROCEDURE  Ask your health care provider about:  Changing or stopping your regular medicines. This is especially important if you are taking diabetes medicines or blood thinners.  Taking medicines such as aspirin and ibuprofen. These medicines can thin your blood. Do not take these medicines before your procedure if your health care provider instructs you not to.  Follow instructions from your health care provider about eating or drinking restrictions.  Plan to have someone take you home after the procedure.  If you go home right after the procedure, plan to have someone with you for 24 hours. PROCEDURE  You will be given one or more of the following:  A medicine to help you relax (sedative).  A medicine to numb the area (local anesthetic).  You will be awake during the procedure. You will need to be able to  talk with the health care provider during the procedure.  With the help of a type of X-ray (fluoroscopy), the health care provider will insert a radiofrequency needle into the area to be treated.  Next, a wire that carries the radio waves (electrode) will be put through the radiofrequency needle. An electrical pulse will be sent through the electrode to verify the correct nerve. You will feel a tingling sensation, and you may have muscle twitching.  Then, the tissue that is around the needle tip will be heated by an electric current that is passed using the radiofrequency machine. This will numb the nerves.  A bandage (dressing) will be put on the insertion area after the procedure is done. The procedure may vary among health care providers and hospitals. AFTER THE PROCEDURE  Your blood pressure, heart rate, breathing rate, and blood oxygen level will be monitored often until the medicines you were given have worn off.  Return to your normal activities as directed by your health care provider.   This information is not intended to replace advice given to you by your health care provider. Make sure you discuss any questions you have with your health care provider.   Document Released: 01/07/2011 Document Revised: 01/30/2015 Document Reviewed: 06/18/2014 Elsevier Interactive Patient Education 2016 [employer]  

## 2015-09-05 NOTE — Assessment & Plan Note (Signed)
This patient's chronic pain medication management will continue to be handled by his primary pain physician Dr. Charlett Blake.

## 2015-10-07 ENCOUNTER — Encounter: Payer: Self-pay | Admitting: Registered Nurse

## 2015-10-07 ENCOUNTER — Encounter: Payer: PPO | Attending: Physical Medicine & Rehabilitation | Admitting: Registered Nurse

## 2015-10-07 VITALS — BP 120/56 | HR 64 | Resp 18

## 2015-10-07 DIAGNOSIS — M545 Low back pain: Secondary | ICD-10-CM | POA: Diagnosis not present

## 2015-10-07 DIAGNOSIS — M533 Sacrococcygeal disorders, not elsewhere classified: Secondary | ICD-10-CM

## 2015-10-07 DIAGNOSIS — M961 Postlaminectomy syndrome, not elsewhere classified: Secondary | ICD-10-CM | POA: Diagnosis not present

## 2015-10-07 DIAGNOSIS — Z981 Arthrodesis status: Secondary | ICD-10-CM | POA: Insufficient documentation

## 2015-10-07 DIAGNOSIS — Z5181 Encounter for therapeutic drug level monitoring: Secondary | ICD-10-CM

## 2015-10-07 DIAGNOSIS — M79605 Pain in left leg: Secondary | ICD-10-CM | POA: Insufficient documentation

## 2015-10-07 DIAGNOSIS — G8928 Other chronic postprocedural pain: Secondary | ICD-10-CM | POA: Insufficient documentation

## 2015-10-07 DIAGNOSIS — M62830 Muscle spasm of back: Secondary | ICD-10-CM

## 2015-10-07 DIAGNOSIS — Z79899 Other long term (current) drug therapy: Secondary | ICD-10-CM

## 2015-10-07 DIAGNOSIS — G894 Chronic pain syndrome: Secondary | ICD-10-CM | POA: Diagnosis not present

## 2015-10-07 DIAGNOSIS — M5416 Radiculopathy, lumbar region: Secondary | ICD-10-CM | POA: Diagnosis not present

## 2015-10-07 DIAGNOSIS — G8929 Other chronic pain: Secondary | ICD-10-CM

## 2015-10-07 DIAGNOSIS — M79604 Pain in right leg: Secondary | ICD-10-CM | POA: Diagnosis not present

## 2015-10-07 MED ORDER — IBUPROFEN 800 MG PO TABS
800.0000 mg | ORAL_TABLET | Freq: Three times a day (TID) | ORAL | Status: DC | PRN
Start: 2015-10-07 — End: 2016-02-28

## 2015-10-07 MED ORDER — MORPHINE SULFATE ER 30 MG PO TBCR: 30.0000 mg | EXTENDED_RELEASE_TABLET | Freq: Two times a day (BID) | ORAL | Status: DC

## 2015-10-07 MED ORDER — METHOCARBAMOL 500 MG PO TABS: 500.0000 mg | ORAL_TABLET | Freq: Three times a day (TID) | ORAL | Status: DC | PRN

## 2015-10-07 MED ORDER — OXYCODONE-ACETAMINOPHEN 10-325 MG PO TABS
1.0000 | ORAL_TABLET | Freq: Four times a day (QID) | ORAL | Status: DC | PRN
Start: 2015-10-07 — End: 2015-11-05

## 2015-10-07 MED ORDER — GABAPENTIN 300 MG PO CAPS: ORAL_CAPSULE | ORAL | Status: DC

## 2015-10-07 NOTE — Progress Notes (Signed)
Subjective:    Patient ID: Samuel [name], male    DOB: 1975-06-27, 40 y.o.   MRN: [id number]  HPI: [name] is a 40 year old male who returns for follow up for chronic pain and medication refill. He states his pain is located in his lower back radiating into his bilateral lower extremities laterally. He rates his pain 6. His current exercise regime is performing stretching exercises and walking short distances.  [name] had his appointment with Dr. Dossie Arbour on April 13,2017 he's scheduled for Right-sided Lumbar Facet Radiofrequency Ablation under fluoroscopic guidance with IV sedation on June 6,2017.  Pain Inventory Average Pain 6 Pain Right Now 6 My pain is constant, sharp, dull, stabbing and aching  In the last 24 hours, has pain interfered with the following? General activity 8 Relation with others 8 Enjoyment of life 8 What TIME of day is your pain at its worst? Morning, Evening and Night Sleep (in general) NA  Pain is worse with: walking, bending, sitting and standing Pain improves with: rest and medication Relief from Meds: 5  Mobility how many minutes can you walk? 5 ability to climb steps?  yes do you drive?  yes transfers alone Do you have any goals in this area?  no  Function disabled: date disabled 2010 I need assistance with the following:  dressing, bathing, meal prep, household duties and shopping Do you have any goals in this area?  no  Neuro/Psych weakness trouble walking spasms depression  Prior Studies Any changes since last visit?  no  Physicians involved in your care Any changes since last visit?  no   Family History  Problem Relation Age of Onset  . Hypertension Father    Social History   Social History  . Marital Status: Married    Spouse Name: N/A  . Number of Children: N/A  . Years of Education: N/A   Social History Main Topics  . Smoking status: Never Smoker   . Smokeless tobacco: None  . Alcohol Use: No  . Drug Use: No   . Sexual Activity: Not Asked   Other Topics Concern  . None   Social History Narrative   Past Surgical History  Procedure Laterality Date  . Back surgery      lower back surgery x 2   Past Medical History  Diagnosis Date  . GERD (gastroesophageal reflux disease)   . Headache(784.0)   . Arthritis     "some in my back"   BP 120/56 mmHg  Pulse 64  Resp 18  SpO2 97%  Opioid Risk Score:   Fall Risk Score:  `1  Depression screen PHQ 2/9  Depression screen [facility] 2/9 09/05/2015 08/05/2015 06/06/2015 03/04/2015 02/04/2015 11/01/2014  Decreased Interest 0 1 0 0 0 1  Down, Depressed, Hopeless 0 1 0 0 0 1  PHQ - 2 Score 0 2 0 0 0 2  Altered sleeping - 2 - - - 1  Tired, decreased energy - 2 - - - 1  Change in appetite - 1 - - - 0  Feeling bad or failure about yourself  - 2 - - - 2  Trouble concentrating - 1 - - - 0  Moving slowly or fidgety/restless - 0 - - - 0  Suicidal thoughts - 0 - - - 0  PHQ-9 Score - 10 - - - 6  Difficult doing work/chores - Somewhat difficult - - - -      Review of Systems  Musculoskeletal:  Spasms  Neurological: Positive for weakness.       Gait Instability  Psychiatric/Behavioral:       Depression  All other systems reviewed and are negative.      Objective:   Physical Exam  Constitutional: He is oriented to person, place, and time. He appears well-developed and well-nourished.  HENT:  Head: Normocephalic and atraumatic.  Neck: Normal range of motion. Neck supple.  Cardiovascular: Normal rate and regular rhythm.   Pulmonary/Chest: Effort normal and breath sounds normal.  Musculoskeletal:  Normal Muscle Bulk and Muscle Testing Reveals: Upper Extremities: Full ROM and Muscle Strength 5/5 Lumbar Hypersensitivity Lower Extremities: Decreased ROM and Muscle Strength 5/5 Bilateral Lower Extremity Flexion Produces pain into Lumbar Arises from chair slowly Antalgic gait  Neurological: He is alert and oriented to person, place, and time.    Skin: Skin is warm and dry.  Psychiatric: He has a normal mood and affect.  Nursing note and vitals reviewed.         Assessment & Plan:  1. 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 He has lumbar degenerative disc disease Refilled: MS Contin 30 mg one tablet every 12 hours #60 and Oxycodone 10/325 mg one tablet every 6 hours as needed for pain #120.  We will continue the opioid monitoring program, this consists of regular clinic visits, examinations, urine drug screen, pill counts as well as use of [place] Controlled Substance reporting System. Had an appointment with Dr. Consuela Mimes on 09/05/15, he's schedule for Right-sided Lumbar Facet Radiofrequency Ablation under fluoroscopic guidance with IV sedation on June 6,2017. 2. Sacroiliac Dysfunction: Continue with current medication, heat and exercise regime  20 minutes of face to face patient care time was spent during this visit. All questions were encouraged and answered.  F/U in 1 month.

## 2015-10-15 LAB — 6-ACETYLMORPHINE,TOXASSURE ADD
6-ACETYLMORPHINE: NEGATIVE
6-acetylmorphine: NOT DETECTED ng/mg creat

## 2015-10-15 LAB — TOXASSURE SELECT,+ANTIDEPR,UR

## 2015-10-15 NOTE — Progress Notes (Signed)
Urine drug screen for this encounter is consistent for prescribed medications.   

## 2015-10-16 ENCOUNTER — Telehealth: Payer: Self-pay | Admitting: *Deleted

## 2015-10-16 NOTE — Telephone Encounter (Signed)
sw pt made him aware that Dr. Dossie Arbour changed his scheduled and need him to come in for his RFA at 10:40am. Pt is aware.Marland KitchenMarland KitchenTD

## 2015-10-29 ENCOUNTER — Encounter: Payer: Self-pay | Admitting: Pain Medicine

## 2015-10-29 ENCOUNTER — Ambulatory Visit: Payer: PPO | Attending: Pain Medicine | Admitting: Pain Medicine

## 2015-10-29 ENCOUNTER — Telehealth: Payer: Self-pay | Admitting: Pain Medicine

## 2015-10-29 DIAGNOSIS — M47816 Spondylosis without myelopathy or radiculopathy, lumbar region: Secondary | ICD-10-CM

## 2015-10-29 DIAGNOSIS — M545 Low back pain: Secondary | ICD-10-CM

## 2015-10-29 NOTE — Progress Notes (Deleted)
Patient here today for lumbar facet with RF on the right.   Safety precautions to be maintained throughout the outpatient stay will include: orient to surroundings, keep bed in low position, maintain call bell within reach at all times, provide assistance with transfer out of bed and ambulation.

## 2015-10-29 NOTE — Patient Instructions (Signed)
Radiofrequency Lesioning Radiofrequency lesioning is a procedure that is performed to relieve pain. The procedure is often used for back, neck, or arm pain. Radiofrequency lesioning involves the use of a machine that creates radio waves to make heat. During the procedure, the heat is applied to the nerve that carries the pain signal. The heat damages the nerve and interferes with the pain signal. Pain relief usually lasts for 6 months to 1 year. LET [facility] CARE PROVIDER KNOW ABOUT:  Any allergies you have.  All medicines you are taking, including vitamins, herbs, eye drops, creams, and over-the-counter medicines.  Previous problems you or members of your family have had with the use of anesthetics.  Any blood disorders you have.  Previous surgeries you have had.  Any medical conditions you have.  Whether you are pregnant or may be pregnant. RISKS AND COMPLICATIONS Generally, this is a safe procedure. However, problems may occur, including:  Pain or soreness at the injection site.  Infection at the injection site.  Damage to nerves or blood vessels. BEFORE THE PROCEDURE  Ask your health care provider about:  Changing or stopping your regular medicines. This is especially important if you are taking diabetes medicines or blood thinners.  Taking medicines such as aspirin and ibuprofen. These medicines can thin your blood. Do not take these medicines before your procedure if your health care provider instructs you not to.  Follow instructions from your health care provider about eating or drinking restrictions.  Plan to have someone take you home after the procedure.  If you go home right after the procedure, plan to have someone with you for 24 hours. PROCEDURE  You will be given one or more of the following:  A medicine to help you relax (sedative).  A medicine to numb the area (local anesthetic).  You will be awake during the procedure. You will need to be able to  talk with the health care provider during the procedure.  With the help of a type of X-ray (fluoroscopy), the health care provider will insert a radiofrequency needle into the area to be treated.  Next, a wire that carries the radio waves (electrode) will be put through the radiofrequency needle. An electrical pulse will be sent through the electrode to verify the correct nerve. You will feel a tingling sensation, and you may have muscle twitching.  Then, the tissue that is around the needle tip will be heated by an electric current that is passed using the radiofrequency machine. This will numb the nerves.  A bandage (dressing) will be put on the insertion area after the procedure is done. The procedure may vary among health care providers and hospitals. AFTER THE PROCEDURE  Your blood pressure, heart rate, breathing rate, and blood oxygen level will be monitored often until the medicines you were given have worn off.  Return to your normal activities as directed by your health care provider.   This information is not intended to replace advice given to you by your health care provider. Make sure you discuss any questions you have with your health care provider.   Document Released: 01/07/2011 Document Revised: 01/30/2015 Document Reviewed: 06/18/2014 Elsevier Interactive Patient Education 2016 [place] Facet Blocks Patient Information  Description: The facets are joints in the spine between the vertebrae.  Like any joints in the body, facets can become irritated and painful.  Arthritis can also effect the facets.  By injecting steroids and local anesthetic in and around these joints, we can  temporarily block the nerve supply to them.  Steroids act directly on irritated nerves and tissues to reduce selling and inflammation which often leads to decreased pain.  Facet blocks may be done anywhere along the spine from the neck to the low back depending upon the location of your  pain.   After numbing the skin with local anesthetic (like Novocaine), a small needle is passed onto the facet joints under x-ray guidance.  You may experience a sensation of pressure while this is being done.  The entire block usually lasts about 15-25 minutes.   Conditions which may be treated by facet blocks:   Low back/buttock pain  Neck/shoulder pain  Certain types of headaches  Preparation for the injection:   Do not eat any solid food or dairy products within 8 hours of your appointment.  You may drink clear liquid up to 3 hours before appointment.  Clear liquids include water, black coffee, juice or soda.  No milk or cream please.  You may take your regular medication, including pain medications, with a sip of water before your appointment.  Diabetics should hold regular insulin (if taken separately) and take 1/2 normal NPH dose the morning of the procedure.  Carry some sugar containing items with you to your appointment.  A driver must accompany you and be prepared to drive you home after your procedure.  Bring all your current medications with you.  An IV may be inserted and sedation may be given at the discretion of the physician.  A blood pressure cuff, EKG and other monitors will often be applied during the procedure.  Some patients may need to have extra oxygen administered for a short period.  You will be asked to provide medical information, including your allergies and medications, prior to the procedure.  We must know immediately if you are taking blood thinners (like Coumadin/Warfarin) or if you are allergic to IV iodine contrast (dye).  We must know if you could possible be pregnant.  Possible side-effects:   Bleeding from needle site  Infection (rare, may require surgery)  Nerve injury (rare)  Numbness & tingling (temporary)  Difficulty urinating (rare, temporary)  Spinal headache (a headache worse with upright posture)  Light-headedness  (temporary)  Pain at injection site (serveral days)  Decreased blood pressure (rare, temporary)  Weakness in arm/leg (temporary)  Pressure sensation in back/neck (temporary)   Call if you experience:   Fever/chills associated with headache or increased back/neck pain  Headache worsened by an upright position  New onset, weakness or numbness of an extremity below the injection site  Hives or difficulty breathing (go to the emergency room)  Inflammation or drainage at the injection site(s)  Severe back/neck pain greater than usual  New symptoms which are concerning to you  Please note:  Although the local anesthetic injected can often make your back or neck feel good for several hours after the injection, the pain will likely return. It takes 3-7 days for steroids to work.  You may not notice any pain relief for at least one week.  If effective, we will often do a series of 2-3 injections spaced 3-6 weeks apart to maximally decrease your pain.  After the initial series, you may be a candidate for a more permanent nerve block of the facets.  If you have any questions, please call [phone]  What are the risk, side effects and possible complications? Generally speaking, most procedures are  safe.  However, with any procedure there are risks, side effects, and the possibility of complications.  The risks and complications are dependent upon the sites that are lesioned, or the type of nerve block to be performed.  The closer the procedure is to the spine, the more serious the risks are.  Great care is taken when placing the radio frequency needles, block needles or lesioning probes, but sometimes complications can occur.  Infection: Any time there is an injection through the skin, there is a risk of infection.  This is why sterile conditions are used for these blocks.  There are four possible types of  infection.  Localized skin infection.  Central Nervous System Infection-This can be in the form of Meningitis, which can be deadly.  Epidural Infections-This can be in the form of an epidural abscess, which can cause pressure inside of the spine, causing compression of the spinal cord with subsequent paralysis. This would require an emergency surgery to decompress, and there are no guarantees that the patient would recover from the paralysis.  Discitis-This is an infection of the intervertebral discs.  It occurs in about 1% of discography procedures.  It is difficult to treat and it may lead to surgery.        2. Pain: the needles have to go through skin and soft tissues, will cause soreness.       3. Damage to internal structures:  The nerves to be lesioned may be near blood vessels or    other nerves which can be potentially damaged.       4. Bleeding: Bleeding is more common if the patient is taking blood thinners such as  aspirin, Coumadin, Ticiid, Plavix, etc., or if he/she have some genetic predisposition  such as hemophilia. Bleeding into the spinal canal can cause compression of the spinal  cord with subsequent paralysis.  This would require an emergency surgery to  decompress and there are no guarantees that the patient would recover from the  paralysis.       5. Pneumothorax:  Puncturing of a lung is a possibility, every time a needle is introduced in  the area of the chest or upper back.  Pneumothorax refers to free air around the  collapsed lung(s), inside of the thoracic cavity (chest cavity).  Another two possible  complications related to a similar event would include: Hemothorax and Chylothorax.   These are variations of the Pneumothorax, where instead of air around the collapsed  lung(s), you may have blood or chyle, respectively.       6. Spinal headaches: They may occur with any procedures in the area of the spine.       7. Persistent CSF (Cerebro-Spinal Fluid) leakage: This is a  rare problem, but may occur  with prolonged intrathecal or epidural catheters either due to the formation of a fistulous  track or a dural tear.       8. Nerve damage: By working so close to the spinal cord, there is always a possibility of  nerve damage, which could be as serious as a permanent spinal cord injury with  paralysis.       9. Death:  Although rare, severe deadly allergic reactions known as "Anaphylactic  reaction" can occur to any of the medications used.      10. Worsening of the symptoms:  We can always make thing worse.  What are the chances of something like this happening? Chances of any of this occuring are extremely  low.  By statistics, you have more of a chance of getting killed in a motor vehicle accident: while driving to the hospital than any of the above occurring .  Nevertheless, you should be aware that they are possibilities.  In general, it is similar to taking a shower.  Everybody knows that you can slip, hit your head and get killed.  Does that mean that you should not shower again?  Nevertheless always keep in mind that statistics do not mean anything if you happen to be on the wrong side of them.  Even if a procedure has a 1 (one) in a 1,000,000 (million) chance of going wrong, it you happen to be that one..Also, keep in mind that by statistics, you have more of a chance of having something go wrong when taking medications.  Who should not have this procedure? If you are on a blood thinning medication (e.g. Coumadin, Plavix, see list of "Blood Thinners"), or if you have an active infection going on, you should not have the procedure.  If you are taking any blood thinners, please inform your physician.  How should I prepare for this procedure?  Do not eat or drink anything at least six hours prior to the procedure.  Bring a driver with you .  It cannot be a taxi.  Come accompanied by an adult that can drive you back, and that is strong enough to help you if your legs  get weak or numb from the local anesthetic.  Take all of your medicines the morning of the procedure with just enough water to swallow them.  If you have diabetes, make sure that you are scheduled to have your procedure done first thing in the morning, whenever possible.  If you have diabetes, take only half of your insulin dose and notify our nurse that you have done so as soon as you arrive at the clinic.  If you are diabetic, but only take blood sugar pills (oral hypoglycemic), then do not take them on the morning of your procedure.  You may take them after you have had the procedure.  Do not take aspirin or any aspirin-containing medications, at least eleven (11) days prior to the procedure.  They may prolong bleeding.  Wear loose fitting clothing that may be easy to take off and that you would not mind if it got stained with Betadine or blood.  Do not wear any jewelry or perfume  Remove any nail coloring.  It will interfere with some of our monitoring equipment.  NOTE: Remember that this is not meant to be interpreted as a complete list of all possible complications.  Unforeseen problems may occur.  BLOOD THINNERS The following drugs contain aspirin or other products, which can cause increased bleeding during surgery and should not be taken for 2 weeks prior to and 1 week after surgery.  If you should need take something for relief of minor pain, you may take acetaminophen which is found in Tylenol,m Datril, Anacin-3 and Panadol. It is not blood thinner. The products listed below are.  Do not take any of the products listed below in addition to any listed on your instruction sheet.  A.P.C or A.P.C with Codeine Codeine Phosphate Capsules #3 Ibuprofen Ridaura  ABC compound Congesprin Imuran rimadil  Advil Cope Indocin Robaxisal  Alka-Seltzer Effervescent Pain Reliever and Antacid Coricidin or Coricidin-D  Indomethacin Rufen  Alka-Seltzer plus Cold Medicine Cosprin Ketoprofen S-A-C  Tablets  Anacin Analgesic Tablets or Capsules Coumadin Korlgesic Salflex  Anacin Extra Strength Analgesic tablets or capsules CP-2 Tablets Lanoril Salicylate  Anaprox Cuprimine Capsules Levenox Salocol  Anexsia-D Dalteparin Magan Salsalate  Anodynos Darvon compound Magnesium Salicylate Sine-off  Ansaid Dasin Capsules Magsal Sodium Salicylate  Anturane Depen Capsules Marnal Soma  APF Arthritis pain formula Dewitt's Pills Measurin Stanback  Argesic Dia-Gesic Meclofenamic Sulfinpyrazone  Arthritis Bayer Timed Release Aspirin Diclofenac Meclomen Sulindac  Arthritis pain formula Anacin Dicumarol Medipren Supac  Analgesic (Safety coated) Arthralgen Diffunasal Mefanamic Suprofen  Arthritis Strength Bufferin Dihydrocodeine Mepro Compound Suprol  Arthropan liquid Dopirydamole Methcarbomol with Aspirin Synalgos  ASA tablets/Enseals Disalcid Micrainin Tagament  Ascriptin Doan's Midol Talwin  Ascriptin A/D Dolene Mobidin Tanderil  Ascriptin Extra Strength Dolobid Moblgesic Ticlid  Ascriptin with Codeine Doloprin or Doloprin with Codeine Momentum Tolectin  Asperbuf Duoprin Mono-gesic Trendar  Aspergum Duradyne Motrin or Motrin IB Triminicin  Aspirin plain, buffered or enteric coated Durasal Myochrisine Trigesic  Aspirin Suppositories Easprin Nalfon Trillsate  Aspirin with Codeine Ecotrin Regular or Extra Strength Naprosyn Uracel  Atromid-S Efficin Naproxen Ursinus  Auranofin Capsules Elmiron Neocylate Vanquish  Axotal Emagrin Norgesic Verin  Azathioprine Empirin or Empirin with Codeine Normiflo Vitamin E  Azolid Emprazil Nuprin Voltaren  Bayer Aspirin plain, buffered or children's or timed BC Tablets or powders Encaprin Orgaran Warfarin Sodium  Buff-a-Comp Enoxaparin Orudis Zorpin  Buff-a-Comp with Codeine Equegesic Os-Cal-Gesic   Buffaprin Excedrin plain, buffered or Extra Strength Oxalid   Bufferin Arthritis Strength Feldene Oxphenbutazone   Bufferin plain or Extra Strength Feldene Capsules  Oxycodone with Aspirin   Bufferin with Codeine Fenoprofen Fenoprofen Pabalate or Pabalate-SF   Buffets II Flogesic Panagesic   Buffinol plain or Extra Strength Florinal or Florinal with Codeine Panwarfarin   Buf-Tabs Flurbiprofen Penicillamine   Butalbital Compound Four-way cold tablets Penicillin   Butazolidin Fragmin Pepto-Bismol   Carbenicillin Geminisyn Percodan   Carna Arthritis Reliever Geopen Persantine   Carprofen Gold's salt Persistin   Chloramphenicol Goody's Phenylbutazone   Chloromycetin Haltrain Piroxlcam   Clmetidine heparin Plaquenil   Cllnoril Hyco-pap Ponstel   Clofibrate Hydroxy chloroquine Propoxyphen         Before stopping any of these medications, be sure to consult the physician who ordered them.  Some, such as Coumadin (Warfarin) are ordered to prevent or treat serious conditions such as "deep thrombosis", "pumonary embolisms", and other heart problems.  The amount of time that you may need off of the medication may also vary with the medication and the reason for which you were taking it.  If you are taking any of these medications, please make sure you notify your pain physician before you undergo any procedures.

## 2015-11-05 ENCOUNTER — Encounter: Payer: PPO | Attending: Physical Medicine & Rehabilitation | Admitting: Registered Nurse

## 2015-11-05 ENCOUNTER — Encounter: Payer: Self-pay | Admitting: Registered Nurse

## 2015-11-05 VITALS — BP 118/72 | HR 73 | Resp 16

## 2015-11-05 DIAGNOSIS — Z79899 Other long term (current) drug therapy: Secondary | ICD-10-CM

## 2015-11-05 DIAGNOSIS — M79605 Pain in left leg: Secondary | ICD-10-CM | POA: Diagnosis not present

## 2015-11-05 DIAGNOSIS — G8929 Other chronic pain: Secondary | ICD-10-CM

## 2015-11-05 DIAGNOSIS — Z5181 Encounter for therapeutic drug level monitoring: Secondary | ICD-10-CM

## 2015-11-05 DIAGNOSIS — G894 Chronic pain syndrome: Secondary | ICD-10-CM

## 2015-11-05 DIAGNOSIS — M545 Low back pain: Secondary | ICD-10-CM | POA: Diagnosis not present

## 2015-11-05 DIAGNOSIS — G8928 Other chronic postprocedural pain: Secondary | ICD-10-CM | POA: Insufficient documentation

## 2015-11-05 DIAGNOSIS — M79604 Pain in right leg: Secondary | ICD-10-CM | POA: Insufficient documentation

## 2015-11-05 DIAGNOSIS — M62830 Muscle spasm of back: Secondary | ICD-10-CM | POA: Diagnosis not present

## 2015-11-05 DIAGNOSIS — M961 Postlaminectomy syndrome, not elsewhere classified: Secondary | ICD-10-CM | POA: Diagnosis not present

## 2015-11-05 DIAGNOSIS — M5416 Radiculopathy, lumbar region: Secondary | ICD-10-CM

## 2015-11-05 DIAGNOSIS — Z981 Arthrodesis status: Secondary | ICD-10-CM | POA: Diagnosis not present

## 2015-11-05 MED ORDER — MORPHINE SULFATE ER 30 MG PO TBCR: 30.0000 mg | EXTENDED_RELEASE_TABLET | Freq: Two times a day (BID) | ORAL | Status: DC

## 2015-11-05 MED ORDER — OXYCODONE-ACETAMINOPHEN 10-325 MG PO TABS: 1.0000 | ORAL_TABLET | Freq: Four times a day (QID) | ORAL | Status: DC | PRN

## 2015-11-05 NOTE — Progress Notes (Signed)
Subjective:    Patient ID: Samuel [name], male    DOB: 1976/02/26, 40 y.o.   MRN: [id number]  HPI: [name] is a 40 year old male who returns for follow up for chronic pain and medication refill. He states his pain is located in his lower back radiating into his bilateral lower extremities laterally. He rates his pain 6. His current exercise regime is performing stretching exercises and walking short distances.  [name] had an appointment with Dr. Dossie Arbour on April 13,2017, the procedure had to be  re-scheduled, since he ate breakfast. It has been re-scheduled for December 10, 2015 for Right-sided Lumbar Facet Radiofrequency Ablation under fluoroscopic guidance with IV sedation.  Pain Inventory Average Pain 6 Pain Right Now 6 My pain is sharp, burning, dull, stabbing and aching  In the last 24 hours, has pain interfered with the following? General activity 8 Relation with others 9 Enjoyment of life 9 What TIME of day is your pain at its worst? All Sleep (in general) Fair  Pain is worse with: walking, bending and standing Pain improves with: rest and medication Relief from Meds: 6  Mobility walk with assistance how many minutes can you walk? 5 ability to climb steps?  yes do you drive?  yes Do you have any goals in this area?  no  Function disabled: date disabled 2010 I need assistance with the following:  dressing, bathing, meal prep, household duties and shopping Do you have any goals in this area?  no  Neuro/Psych weakness trouble walking spasms depression  Prior Studies Any changes since last visit?  no  Physicians involved in your care Any changes since last visit?  no   Family History  Problem Relation Age of Onset  . Hypertension Father    Social History   Social History  . Marital Status: Married    Spouse Name: N/A  . Number of Children: N/A  . Years of Education: N/A   Social History Main Topics  . Smoking status: Never Smoker   . Smokeless  tobacco: None  . Alcohol Use: No  . Drug Use: No  . Sexual Activity: Not Asked   Other Topics Concern  . None   Social History Narrative   Past Surgical History  Procedure Laterality Date  . Back surgery      lower back surgery x 2   Past Medical History  Diagnosis Date  . GERD (gastroesophageal reflux disease)   . Headache(784.0)   . Arthritis     "some in my back"   BP 118/72 mmHg  Pulse 73  Resp 16  SpO2 94%  Opioid Risk Score:   Fall Risk Score:  `1  Depression screen PHQ 2/9  Depression screen [facility] 2/9 11/05/2015 09/05/2015 08/05/2015 06/06/2015 03/04/2015 02/04/2015 11/01/2014  Decreased Interest 0 0 1 0 0 0 1  Down, Depressed, Hopeless 0 0 1 0 0 0 1  PHQ - 2 Score 0 0 2 0 0 0 2  Altered sleeping - - 2 - - - 1  Tired, decreased energy - - 2 - - - 1  Change in appetite - - 1 - - - 0  Feeling bad or failure about yourself  - - 2 - - - 2  Trouble concentrating - - 1 - - - 0  Moving slowly or fidgety/restless - - 0 - - - 0  Suicidal thoughts - - 0 - - - 0  PHQ-9 Score - - 10 - - -  6  Difficult doing work/chores - - Somewhat difficult - - - -       Review of Systems  All other systems reviewed and are negative.      Objective:   Physical Exam  Constitutional: He is oriented to person, place, and time. He appears well-developed and well-nourished.  HENT:  Head: Normocephalic and atraumatic.  Neck: Normal range of motion. Neck supple.  Cardiovascular: Normal rate and regular rhythm.   Pulmonary/Chest: Effort normal and breath sounds normal.  Musculoskeletal:  Normal Muscle Bulk and Muscle Testing Reveals: Upper Extremities: Full ROM and Muscle Strength 5/5 Lumbar Hypersensitivity Lower Extremities: Decreased ROM and Muscle Strength 4/5 Bilateral Lower Extremity Flexion Produces Pain into Lumbar Arises from chair slowly Antalgic Gait  Neurological: He is alert and oriented to person, place, and time.  Skin: Skin is warm and dry.  Psychiatric: He has a  normal mood and affect.  Nursing note and vitals reviewed.         Assessment & Plan:  1. 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 He has lumbar degenerative disc disease Refilled: MS Contin 30 mg one tablet every 12 hours #60 and Oxycodone 10/325 mg one tablet every 6 hours as needed for pain #120.  We will continue the opioid monitoring program, this consists of regular clinic visits, examinations, urine drug screen, pill counts as well as use of [place] Controlled Substance reporting System. He's schedule for Right-sided Lumbar Facet Radiofrequency Ablation under fluoroscopic guidance with IV sedation on July 18,2017. 2. Sacroiliac Dysfunction: Continue with current medication, heat and exercise regime  20 minutes of face to face patient care time was spent during this visit. All questions were encouraged and answered.  F/U in 1 month.

## 2015-11-14 NOTE — Progress Notes (Signed)
Patient ID: Samuel [name], male   DOB: 1976-05-25, 40 y.o.   MRN: [id number] Case rescheduled.

## 2015-11-14 NOTE — Telephone Encounter (Signed)
Encounter open by mistake

## 2015-12-03 ENCOUNTER — Encounter: Payer: Self-pay | Admitting: Registered Nurse

## 2015-12-03 ENCOUNTER — Encounter: Payer: PPO | Attending: Physical Medicine & Rehabilitation | Admitting: Registered Nurse

## 2015-12-03 VITALS — BP 118/79 | HR 79 | Resp 17

## 2015-12-03 DIAGNOSIS — M545 Low back pain: Secondary | ICD-10-CM | POA: Diagnosis not present

## 2015-12-03 DIAGNOSIS — Z5181 Encounter for therapeutic drug level monitoring: Secondary | ICD-10-CM

## 2015-12-03 DIAGNOSIS — M79604 Pain in right leg: Secondary | ICD-10-CM | POA: Diagnosis not present

## 2015-12-03 DIAGNOSIS — G8928 Other chronic postprocedural pain: Secondary | ICD-10-CM | POA: Diagnosis not present

## 2015-12-03 DIAGNOSIS — Z981 Arthrodesis status: Secondary | ICD-10-CM | POA: Diagnosis not present

## 2015-12-03 DIAGNOSIS — Z79899 Other long term (current) drug therapy: Secondary | ICD-10-CM

## 2015-12-03 DIAGNOSIS — M79605 Pain in left leg: Secondary | ICD-10-CM | POA: Insufficient documentation

## 2015-12-03 DIAGNOSIS — M5416 Radiculopathy, lumbar region: Secondary | ICD-10-CM

## 2015-12-03 DIAGNOSIS — G894 Chronic pain syndrome: Secondary | ICD-10-CM

## 2015-12-03 DIAGNOSIS — M961 Postlaminectomy syndrome, not elsewhere classified: Secondary | ICD-10-CM

## 2015-12-03 DIAGNOSIS — M62830 Muscle spasm of back: Secondary | ICD-10-CM | POA: Diagnosis not present

## 2015-12-03 DIAGNOSIS — G8929 Other chronic pain: Secondary | ICD-10-CM

## 2015-12-03 MED ORDER — OXYCODONE-ACETAMINOPHEN 10-325 MG PO TABS: 1.0000 | ORAL_TABLET | Freq: Four times a day (QID) | ORAL | Status: DC | PRN

## 2015-12-03 MED ORDER — MORPHINE SULFATE ER 30 MG PO TBCR: 30.0000 mg | EXTENDED_RELEASE_TABLET | Freq: Two times a day (BID) | ORAL | Status: DC

## 2015-12-03 NOTE — Patient Instructions (Signed)
Increase Gabapentin One in the Morning and One late Afternoon   And Two at Bedtime  Call Office Next week Monday to evaluate [phone]

## 2015-12-03 NOTE — Progress Notes (Deleted)
   Subjective:    Patient ID: Samuel [name], male    DOB: [birth year], 40 y.o.   MRN: [id number]  HPI: [name] is a 40 year old male who returns for follow up for chronic pain and medication refill. He states his pain is located in his lower back radiating into his bilateral lower extremities laterally. He rates his pain 6. His current exercise regime is performing stretching exercises and walking short distances.  Mr. [name] had an appointment with Dr. Dossie Arbour on April 13,2017, the procedure had to be re-scheduled, since he ate breakfast. It has been re-scheduled for December 10, 2015 for Right-sided Lumbar Facet Radiofrequency Ablation under fluoroscopic guidance with IV sedation.    Review of Systems     Objective:   Physical Exam        Assessment & Plan:  1. 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 He has lumbar degenerative disc disease Refilled: MS Contin 30 mg one tablet every 12 hours #60 and Oxycodone 10/325 mg one tablet every 6 hours as needed for pain #120.  We will continue the opioid monitoring program, this consists of regular clinic visits, examinations, urine drug screen, pill counts as well as use of [place] Controlled Substance reporting System. He's schedule for Right-sided Lumbar Facet Radiofrequency Ablation under fluoroscopic guidance with IV sedation on July 18,2017. 2. Sacroiliac Dysfunction: Continue with current medication, heat and exercise regime  20 minutes of face to face patient care time was spent during this visit. All questions were encouraged and answered.  F/U in 1 month.

## 2015-12-03 NOTE — Progress Notes (Signed)
Subjective:    Patient ID: Samuel [name], male    DOB: 08/16/1975, 40 y.o.   MRN: [id number]  HPI: Mr. RADEK KEITA is a 40 year old male who returns for follow up for chronic pain and medication refill. He states his pain is located in his lower back radiating into his bilateral lower extremities laterally. Also states he's experiencing increase intensity of nerve pain gabapentin increase, instructed to call office on 12/09/15, he verbalizes understanding.He rates his pain 7. His current exercise regime is performing stretching exercises and walking short distances.  Mr. Stoup has an appointment with Dr. Dossie Arbour on August 1,2017, for Right-sided Lumbar Facet Radiofrequency Ablation under fluoroscopic guidance with IV sedation.  Pain Inventory Average Pain 6 Pain Right Now 7 My pain is constant, sharp, dull, stabbing and aching  In the last 24 hours, has pain interfered with the following? General activity 8 Relation with others 8 Enjoyment of life 8 What TIME of day is your pain at its worst? morning, evening, night Sleep (in general) Fair  Pain is worse with: walking, bending and standing Pain improves with: rest and medication Relief from Meds: 4  Mobility walk with assistance how many minutes can you walk? 5 ability to climb steps?  yes do you drive?  yes Do you have any goals in this area?  no  Function disabled: date disabled NA I need assistance with the following:  dressing, bathing, meal prep, household duties and shopping Do you have any goals in this area?  no  Neuro/Psych weakness spasms depression  Prior Studies Any changes since last visit?  no  Physicians involved in your care Any changes since last visit?  no   Family History  Problem Relation Age of Onset  . Hypertension Father    Social History   Social History  . Marital Status: Married    Spouse Name: N/A  . Number of Children: N/A  . Years of Education: N/A   Social History Main Topics    . Smoking status: Never Smoker   . Smokeless tobacco: None  . Alcohol Use: No  . Drug Use: No  . Sexual Activity: Not Asked   Other Topics Concern  . None   Social History Narrative   Past Surgical History  Procedure Laterality Date  . Back surgery      lower back surgery x 2   Past Medical History  Diagnosis Date  . GERD (gastroesophageal reflux disease)   . Headache(784.0)   . Arthritis     "some in my back"   BP 118/79 mmHg  Pulse 79  Resp 17  SpO2 95%  Opioid Risk Score:   Fall Risk Score:  `1  Depression screen PHQ 2/9  Depression screen [facility] 2/9 11/05/2015 09/05/2015 08/05/2015 06/06/2015 03/04/2015 02/04/2015 11/01/2014  Decreased Interest 0 0 1 0 0 0 1  Down, Depressed, Hopeless 0 0 1 0 0 0 1  PHQ - 2 Score 0 0 2 0 0 0 2  Altered sleeping - - 2 - - - 1  Tired, decreased energy - - 2 - - - 1  Change in appetite - - 1 - - - 0  Feeling bad or failure about yourself  - - 2 - - - 2  Trouble concentrating - - 1 - - - 0  Moving slowly or fidgety/restless - - 0 - - - 0  Suicidal thoughts - - 0 - - - 0  PHQ-9 Score - - 10 - - -  6  Difficult doing work/chores - - Somewhat difficult - - - -     Review of Systems  Neurological: Positive for weakness.       Spasms   Psychiatric/Behavioral: Positive for dysphoric mood.  All other systems reviewed and are negative.      Objective:   Physical Exam  Constitutional: He is oriented to person, place, and time. He appears well-developed and well-nourished.  HENT:  Head: Normocephalic and atraumatic.  Neck: Normal range of motion. Neck supple.  Cardiovascular: Normal rate and regular rhythm.   Pulmonary/Chest: Effort normal and breath sounds normal.  Musculoskeletal:  Normal Muscle Bulk and Muscle Testing Reveals: Upper Extremities: Full ROM and Muscle Strength 5/5 Lumbar Hypersensitivity Lower Extremities: Full ROM and Muscle Strength 5/5 Bilateral Lower Extremities Flexion Produces Pain into Lumbar Arises from  Table Slowly Antalgic Gait  Neurological: He is alert and oriented to person, place, and time.  Skin: Skin is warm and dry.  Psychiatric: He has a normal mood and affect.  Nursing note and vitals reviewed.         Assessment & Plan:  1. 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 He has lumbar degenerative disc disease. Continue Gabapentin : Increased  Refilled: MS Contin 30 mg one tablet every 12 hours #60 and Oxycodone 10/325 mg one tablet every 6 hours as needed for pain #120.  We will continue the opioid monitoring program, this consists of regular clinic visits, examinations, urine drug screen, pill counts as well as use of [place] Controlled Substance reporting System. He's schedule for Right-sided Lumbar Facet Radiofrequency Ablation under fluoroscopic guidance with IV sedation on August 1,2017. 2. Sacroiliac Dysfunction: Continue with current medication, heat and exercise regime 3. Muscle Spasm: Continue Robaxin 20 minutes of face to face patient care time was spent during this visit. All questions were encouraged and answered.  F/U in 1 month.

## 2015-12-10 ENCOUNTER — Ambulatory Visit: Payer: Self-pay | Admitting: Pain Medicine

## 2015-12-24 ENCOUNTER — Telehealth: Payer: Self-pay

## 2015-12-24 ENCOUNTER — Encounter: Payer: Self-pay | Admitting: Pain Medicine

## 2015-12-24 ENCOUNTER — Ambulatory Visit: Payer: PPO | Attending: Pain Medicine | Admitting: Pain Medicine

## 2015-12-24 VITALS — BP 124/55 | HR 67 | Temp 98.4°F | Resp 21 | Ht 73.0 in | Wt 325.0 lb

## 2015-12-24 DIAGNOSIS — M5116 Intervertebral disc disorders with radiculopathy, lumbar region: Secondary | ICD-10-CM | POA: Diagnosis not present

## 2015-12-24 DIAGNOSIS — M533 Sacrococcygeal disorders, not elsewhere classified: Secondary | ICD-10-CM | POA: Insufficient documentation

## 2015-12-24 DIAGNOSIS — M47896 Other spondylosis, lumbar region: Secondary | ICD-10-CM | POA: Insufficient documentation

## 2015-12-24 DIAGNOSIS — M4726 Other spondylosis with radiculopathy, lumbar region: Secondary | ICD-10-CM | POA: Insufficient documentation

## 2015-12-24 DIAGNOSIS — G8929 Other chronic pain: Secondary | ICD-10-CM

## 2015-12-24 DIAGNOSIS — Z981 Arthrodesis status: Secondary | ICD-10-CM | POA: Insufficient documentation

## 2015-12-24 DIAGNOSIS — Z79891 Long term (current) use of opiate analgesic: Secondary | ICD-10-CM | POA: Diagnosis not present

## 2015-12-24 DIAGNOSIS — M545 Low back pain: Secondary | ICD-10-CM | POA: Diagnosis not present

## 2015-12-24 DIAGNOSIS — M961 Postlaminectomy syndrome, not elsewhere classified: Secondary | ICD-10-CM | POA: Diagnosis not present

## 2015-12-24 DIAGNOSIS — M47816 Spondylosis without myelopathy or radiculopathy, lumbar region: Secondary | ICD-10-CM | POA: Diagnosis not present

## 2015-12-24 DIAGNOSIS — G8918 Other acute postprocedural pain: Secondary | ICD-10-CM | POA: Insufficient documentation

## 2015-12-24 MED ORDER — OXYCODONE-ACETAMINOPHEN 10-325 MG PO TABS: 1.0000 | ORAL_TABLET | ORAL | 0 refills | Status: DC | PRN

## 2015-12-24 MED ORDER — ROPIVACAINE HCL 2 MG/ML IJ SOLN: 9.0000 mL | Freq: Once | INTRAMUSCULAR | Status: DC

## 2015-12-24 MED ORDER — FENTANYL CITRATE (PF) 100 MCG/2ML IJ SOLN: 25.0000 ug | INTRAMUSCULAR | Status: DC | PRN

## 2015-12-24 MED ORDER — TRIAMCINOLONE ACETONIDE 40 MG/ML IJ SUSP
INTRAMUSCULAR | Status: AC
  Administered 2015-12-24: 11:00:00
  Filled 2015-12-24: qty 1

## 2015-12-24 MED ORDER — FENTANYL CITRATE (PF) 100 MCG/2ML IJ SOLN
INTRAMUSCULAR | Status: AC
Start: 2015-12-24 — End: 2015-12-24
  Administered 2015-12-24: 100 ug via INTRAVENOUS
  Filled 2015-12-24: qty 2

## 2015-12-24 MED ORDER — LACTATED RINGERS IV SOLN: 1000.0000 mL | Freq: Once | INTRAVENOUS | Status: DC

## 2015-12-24 MED ORDER — MIDAZOLAM HCL 5 MG/5ML IJ SOLN: 1.0000 mg | INTRAMUSCULAR | Status: DC | PRN

## 2015-12-24 MED ORDER — MIDAZOLAM HCL 5 MG/5ML IJ SOLN
INTRAMUSCULAR | Status: AC
  Administered 2015-12-24: 4 mg via INTRAVENOUS
  Filled 2015-12-24: qty 5

## 2015-12-24 MED ORDER — TRIAMCINOLONE ACETONIDE 40 MG/ML IJ SUSP: 40.0000 mg | Freq: Once | INTRAMUSCULAR | Status: DC

## 2015-12-24 MED ORDER — ROPIVACAINE HCL 2 MG/ML IJ SOLN
INTRAMUSCULAR | Status: AC
Start: 2015-12-24 — End: 2015-12-24
  Administered 2015-12-24: 11:00:00
  Filled 2015-12-24: qty 10

## 2015-12-24 MED ORDER — LIDOCAINE HCL (PF) 1 % IJ SOLN
10.0000 mL | Freq: Once | INTRAMUSCULAR | Status: DC
  Filled 2015-12-24: qty 10

## 2015-12-24 NOTE — Patient Instructions (Signed)
Radiofrequency Lesioning, Care After Refer to this sheet in the next few weeks. These instructions provide you with information about caring for yourself after your procedure. Your health care provider may also give you more specific instructions. Your treatment has been planned according to current medical practices, but problems sometimes occur. Call your health care provider if you have any problems or questions after your procedure. WHAT TO EXPECT AFTER THE PROCEDURE After your procedure, it is common to have:  Pain from the burned nerve.  Temporary numbness. HOME CARE INSTRUCTIONS  Take over-the-counter and prescription medicines only as told by your health care provider.  Return to your normal activities as told by your health care provider. Ask your health care provider what activities are safe for you.  Pay close attention to how you feel after the procedure. If you start to have pain, write down when it hurts and how it feels. This will help you and your health care provider to know if you need an additional treatment.  Check your needle insertion site every day for signs of infection. Watch for:  Redness, swelling, or pain.  Fluid, blood, or pus.  Keep all follow-up visits as told by your health care provider. This is important. SEEK MEDICAL CARE IF:  Your pain does not get better.  You have redness, swelling, or pain at the needle insertion site.  You have fluid, blood, or pus coming from the needle insertion site.  You have a fever. SEEK IMMEDIATE MEDICAL CARE IF:  You develop sudden, severe pain.  You develop numbness or tingling near the procedure site that does not go away.   This information is not intended to replace advice given to you by your health care provider. Make sure you discuss any questions you have with your health care provider.   Document Released: 01/07/2011 Document Revised: 01/30/2015 Document Reviewed: 06/18/2014 Elsevier Interactive Patient  Education 2016 [employer] GENERAL RISKS AND COMPLICATIONS  What are the risk, side effects and possible complications? Generally speaking, most procedures are safe.  However, with any procedure there are risks, side effects, and the possibility of complications.  The risks and complications are dependent upon the sites that are lesioned, or the type of nerve block to be performed.  The closer the procedure is to the spine, the more serious the risks are.  Great care is taken when placing the radio frequency needles, block needles or lesioning probes, but sometimes complications can occur. 1. Infection: Any time there is an injection through the skin, there is a risk of infection.  This is why sterile conditions are used for these blocks.  There are four possible types of infection. 1. Localized skin infection. 2. Central Nervous System Infection-This can be in the form of Meningitis, which can be deadly. 3. Epidural Infections-This can be in the form of an epidural abscess, which can cause pressure inside of the spine, causing compression of the spinal cord with subsequent paralysis. This would require an emergency surgery to decompress, and there are no guarantees that the patient would recover from the paralysis. 4. Discitis-This is an infection of the intervertebral discs.  It occurs in about 1% of discography procedures.  It is difficult to treat and it may lead to surgery.        2. Pain: the needles have to go through skin and soft tissues, will cause soreness.       3. Damage to internal structures:  The nerves to be lesioned may be near   blood vessels or    other nerves which can be potentially damaged.       4. Bleeding: Bleeding is more common if the patient is taking blood thinners such as  aspirin, Coumadin, Ticiid, Plavix, etc., or if he/she have some genetic predisposition  such as hemophilia. Bleeding into the spinal canal can cause compression of the spinal  cord with subsequent  paralysis.  This would require an emergency surgery to  decompress and there are no guarantees that the patient would recover from the  paralysis.       5. Pneumothorax:  Puncturing of a lung is a possibility, every time a needle is introduced in  the area of the chest or upper back.  Pneumothorax refers to free air around the  collapsed lung(s), inside of the thoracic cavity (chest cavity).  Another two possible  complications related to a similar event would include: Hemothorax and Chylothorax.   These are variations of the Pneumothorax, where instead of air around the collapsed  lung(s), you may have blood or chyle, respectively.       6. Spinal headaches: They may occur with any procedures in the area of the spine.       7. Persistent CSF (Cerebro-Spinal Fluid) leakage: This is a rare problem, but may occur  with prolonged intrathecal or epidural catheters either due to the formation of a fistulous  track or a dural tear.       8. Nerve damage: By working so close to the spinal cord, there is always a possibility of  nerve damage, which could be as serious as a permanent spinal cord injury with  paralysis.       9. Death:  Although rare, severe deadly allergic reactions known as "Anaphylactic  reaction" can occur to any of the medications used.      10. Worsening of the symptoms:  We can always make thing worse.  What are the chances of something like this happening? Chances of any of this occuring are extremely low.  By statistics, you have more of a chance of getting killed in a motor vehicle accident: while driving to the hospital than any of the above occurring .  Nevertheless, you should be aware that they are possibilities.  In general, it is similar to taking a shower.  Everybody knows that you can slip, hit your head and get killed.  Does that mean that you should not shower again?  Nevertheless always keep in mind that statistics do not mean anything if you happen to be on the wrong side of  them.  Even if a procedure has a 1 (one) in a 1,000,000 (million) chance of going wrong, it you happen to be that one..Also, keep in mind that by statistics, you have more of a chance of having something go wrong when taking medications.  Who should not have this procedure? If you are on a blood thinning medication (e.g. Coumadin, Plavix, see list of "Blood Thinners"), or if you have an active infection going on, you should not have the procedure.  If you are taking any blood thinners, please inform your physician.  How should I prepare for this procedure?  Do not eat or drink anything at least six hours prior to the procedure.  Bring a driver with you .  It cannot be a taxi.  Come accompanied by an adult that can drive you back, and that is strong enough to help you if your legs get weak or numb   from the local anesthetic.  Take all of your medicines the morning of the procedure with just enough water to swallow them.  If you have diabetes, make sure that you are scheduled to have your procedure done first thing in the morning, whenever possible.  If you have diabetes, take only half of your insulin dose and notify our nurse that you have done so as soon as you arrive at the clinic.  If you are diabetic, but only take blood sugar pills (oral hypoglycemic), then do not take them on the morning of your procedure.  You may take them after you have had the procedure.  Do not take aspirin or any aspirin-containing medications, at least eleven (11) days prior to the procedure.  They may prolong bleeding.  Wear loose fitting clothing that may be easy to take off and that you would not mind if it got stained with Betadine or blood.  Do not wear any jewelry or perfume  Remove any nail coloring.  It will interfere with some of our monitoring equipment.  NOTE: Remember that this is not meant to be interpreted as a complete list of all possible complications.  Unforeseen problems may occur.  BLOOD  THINNERS The following drugs contain aspirin or other products, which can cause increased bleeding during surgery and should not be taken for 2 weeks prior to and 1 week after surgery.  If you should need take something for relief of minor pain, you may take acetaminophen which is found in Tylenol,m Datril, Anacin-3 and Panadol. It is not blood thinner. The products listed below are.  Do not take any of the products listed below in addition to any listed on your instruction sheet.  A.P.C or A.P.C with Codeine Codeine Phosphate Capsules #3 Ibuprofen Ridaura  ABC compound Congesprin Imuran rimadil  Advil Cope Indocin Robaxisal  Alka-Seltzer Effervescent Pain Reliever and Antacid Coricidin or Coricidin-D  Indomethacin Rufen  Alka-Seltzer plus Cold Medicine Cosprin Ketoprofen S-A-C Tablets  Anacin Analgesic Tablets or Capsules Coumadin Korlgesic Salflex  Anacin Extra Strength Analgesic tablets or capsules CP-2 Tablets Lanoril Salicylate  Anaprox Cuprimine Capsules Levenox Salocol  Anexsia-D Dalteparin Magan Salsalate  Anodynos Darvon compound Magnesium Salicylate Sine-off  Ansaid Dasin Capsules Magsal Sodium Salicylate  Anturane Depen Capsules Marnal Soma  APF Arthritis pain formula Dewitt's Pills Measurin Stanback  Argesic Dia-Gesic Meclofenamic Sulfinpyrazone  Arthritis Bayer Timed Release Aspirin Diclofenac Meclomen Sulindac  Arthritis pain formula Anacin Dicumarol Medipren Supac  Analgesic (Safety coated) Arthralgen Diffunasal Mefanamic Suprofen  Arthritis Strength Bufferin Dihydrocodeine Mepro Compound Suprol  Arthropan liquid Dopirydamole Methcarbomol with Aspirin Synalgos  ASA tablets/Enseals Disalcid Micrainin Tagament  Ascriptin Doan's Midol Talwin  Ascriptin A/D Dolene Mobidin Tanderil  Ascriptin Extra Strength Dolobid Moblgesic Ticlid  Ascriptin with Codeine Doloprin or Doloprin with Codeine Momentum Tolectin  Asperbuf Duoprin Mono-gesic Trendar  Aspergum Duradyne Motrin or Motrin  IB Triminicin  Aspirin plain, buffered or enteric coated Durasal Myochrisine Trigesic  Aspirin Suppositories Easprin Nalfon Trillsate  Aspirin with Codeine Ecotrin Regular or Extra Strength Naprosyn Uracel  Atromid-S Efficin Naproxen Ursinus  Auranofin Capsules Elmiron Neocylate Vanquish  Axotal Emagrin Norgesic Verin  Azathioprine Empirin or Empirin with Codeine Normiflo Vitamin E  Azolid Emprazil Nuprin Voltaren  Bayer Aspirin plain, buffered or children's or timed BC Tablets or powders Encaprin Orgaran Warfarin Sodium  Buff-a-Comp Enoxaparin Orudis Zorpin  Buff-a-Comp with Codeine Equegesic Os-Cal-Gesic   Buffaprin Excedrin plain, buffered or Extra Strength Oxalid   Bufferin Arthritis Strength Feldene Oxphenbutazone   Bufferin   plain or Extra Strength Feldene Capsules Oxycodone with Aspirin   Bufferin with Codeine Fenoprofen Fenoprofen Pabalate or Pabalate-SF   Buffets II Flogesic Panagesic   Buffinol plain or Extra Strength Florinal or Florinal with Codeine Panwarfarin   Buf-Tabs Flurbiprofen Penicillamine   Butalbital Compound Four-way cold tablets Penicillin   Butazolidin Fragmin Pepto-Bismol   Carbenicillin Geminisyn Percodan   Carna Arthritis Reliever Geopen Persantine   Carprofen Gold's salt Persistin   Chloramphenicol Goody's Phenylbutazone   Chloromycetin Haltrain Piroxlcam   Clmetidine heparin Plaquenil   Cllnoril Hyco-pap Ponstel   Clofibrate Hydroxy chloroquine Propoxyphen         Before stopping any of these medications, be sure to consult the physician who ordered them.  Some, such as Coumadin (Warfarin) are ordered to prevent or treat serious conditions such as "deep thrombosis", "pumonary embolisms", and other heart problems.  The amount of time that you may need off of the medication may also vary with the medication and the reason for which you were taking it.  If you are taking any of these medications, please make sure you notify your pain physician before you  undergo any procedures.          

## 2015-12-24 NOTE — Telephone Encounter (Signed)
Vickie-from Pain management in [place]-called to coordinate about pt's care. Left a message to return call.

## 2015-12-24 NOTE — Progress Notes (Signed)
.  pms  

## 2015-12-24 NOTE — Progress Notes (Signed)
Patient's Name: Samuel [name]  Patient type: Established  MRN: [id number]  Service setting: Ambulatory outpatient  DOB: Aug 18, 1975  Location: [facility]  DOS: 12/24/2015  Primary Care Physician: Pcp Not In System  Note by: Matis Monnier A. Dossie Arbour, M.D, DABA, DABAPM, DABPM, DABIPP, FIPP  Referring Physician: No ref. provider found  Specialty: Board-Certified Interventional Pain Management  Last Visit to Pain Management: 10/29/2015   Primary Reason(s) for Visit: Interventional Pain Management Treatment. CC: Back Pain (lower )  Facet syndrome, lumbar [M54.5]   Procedure:  Anesthesia, Analgesia, Anxiolysis:  Type: Therapeutic Medial Branch Facet Radiofrequency Ablation Region: Lumbar Level: L2, L3, L4, L5, & S1 Medial Branch Level(s) Laterality: Right-Sided  Indications: 1. Lumbar facet syndrome (Location of Primary Source of Pain) (Bilateral) (R>L)   2. Chronic low back pain (Location of Primary Source of Pain) (Bilateral) (R>L)   3. Lumbar spondylosis, unspecified spinal osteoarthritis   4. Acute postoperative pain     Pre-procedure Pain Score: 5/10 Reported level of pain is compatible with clinical observations Post-procedure Pain Score: 2   The patient has failed to respond to conservative therapies including over-the-counter medications, anti-inflammatories, muscle relaxants, membrane stabilizers, opioids, physical therapy, modalities such as heat and ice, as well as more invasive techniques such as nerve blocks. The patient did attained more than 50% relief of the pain from a series of diagnostic injections conducted in separate occasions.  Type: Moderate (Conscious) Sedation & Local Anesthesia Local Anesthetic: Lidocaine 1% Route: Intravenous (IV) IV Access: Secured Sedation: Meaningful verbal contact was maintained at all times during the procedure  Indication(s): Analgesia & Anxiolysis   Pre-Procedure Assessment:  Samuel [name] is a 40 y.o. year old, male  patient, seen today for interventional treatment. He has Lumbar DDD; Lumbar post-laminectomy syndrome; Chronic lumbar radicular pain (Location of Secondary source of pain) (Bilateral) (R>L) (L5 dermatome); Sacroiliac dysfunction; Lumbar facet syndrome (Location of Primary Source of Pain) (Bilateral) (R>L); Chronic low back pain (Location of Primary Source of Pain) (Bilateral) (R>L); Lumbar spondylosis; Chronic pain; Long term current use of opiate analgesic; Long term prescription opiate use; Opiate use (120 MME/Day); Encounter for pain management planning; Chronic lower extremity pain (Location of Secondary source of pain) (Bilateral) (R>L); 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 His primarily concern today is the Back Pain (lower )   Pain Type: Chronic pain Pain Location: Back (lower down both legs) Pain Descriptors / Indicators: Constant, Aching, Stabbing, Burning  Date of Last Visit: 10/29/15 Service Provided on Last Visit: Evaluation  Coagulation Parameters Lab Results  Component Value Date   PLT 138 (L) 06/13/2013    Verification of the correct person, correct site (including marking of site), and correct procedure were performed and confirmed by the patient.  Consent: Secured. Under the influence of no sedatives a written informed consent was obtained, after having provided information on the risks and possible complications. To fulfill our ethical and legal obligations, as recommended by the American Medical Association's Code of Ethics, we have provided information to the patient about our clinical impression; the nature and purpose of the treatment or procedure; the risks, benefits, and possible complications of the intervention; alternatives; the risk(s) and benefit(s) of the alternative treatment(s) or procedure(s); and the  risk(s) and benefit(s) of doing nothing. The patient was provided information about the risks and possible complications associated with the procedure. These include,  but are not limited to, failure to achieve desired goals, infection, bleeding, organ or nerve damage, allergic reactions, paralysis, and death. In the case of spinal procedures these may include, but are not limited to, failure to achieve desired goals, infection, bleeding, organ or nerve damage, allergic reactions, paralysis, and death. In addition, the patient was informed that Medicine is not an exact science; therefore, there is also the possibility of unforeseen risks and possible complications that may result in a catastrophic outcome. The patient indicated having understood very clearly. We have given the patient no guarantees and we have made no promises. Enough time was given to the patient to ask questions, all of which were answered to the patient's satisfaction.  Consent Attestation: I, the ordering provider, attest that I have discussed with the patient the benefits, risks, side-effects, alternatives, likelihood of achieving goals, and potential problems during recovery for the procedure that I have provided informed consent.  Pre-Procedure Preparation: Safety Precautions: Allergies reviewed. Appropriate site, procedure, and patient were confirmed by following the Joint Commission's Universal Protocol (UP.01.01.01), in the form of a "Time Out". The patient was asked to confirm marked site and procedure, before commencing. The patient was asked about blood thinners, or active infections, both of which were denied. Patient was assessed for positional comfort and all pressure points were checked before starting procedure. Allergies: He has No Known Allergies.. Infection Control Precautions: Aseptic technique used. Standard Universal Precautions were taken as recommended by the Department of [place] for Disease Control  and Prevention (CDC). Standard pre-surgical skin prep was conducted. Respiratory hygiene and cough etiquette was practiced. Hand hygiene observed. Safe injection practices and needle disposal techniques followed. SDV (single dose vial) medications used. Medications properly checked for expiration dates and contaminants. Personal protective equipment (PPE) used: Sterile Radiation-resistant gloves. Monitoring:  As per clinic protocol. Vitals:   12/24/15 1204 12/24/15 1208 12/24/15 1219 12/24/15 1230  BP: 123/89 (!) 135/57 126/74 (!) 124/55  Pulse: 79 79 76 67  Resp: _0 (!) 21  Temp:  98 F (36.7 C)  98.4 F (36.9 C)  TempSrc:      SpO2: 94% 97% 97% 96%  Weight:      Height:      Calculated BMI: Body mass index is 42.88 kg/m.  Description of Procedure Process:   Time-out: "Time-out" completed before starting procedure, as per protocol. Position: Prone Target Area: For Lumbar Facet blocks, the target is the groove formed by the junction of the transverse process and superior articular process. For the L5 dorsal ramus, the target is the notch between superior articular process and sacral ala. For the S1 dorsal ramus, the target is the superior and lateral edge of the posterior S1 Sacral foramen. Approach: Paraspinal approach. Area Prepped: Entire Posterior Lumbosacral Region Prepping solution: ChloraPrep (2% chlorhexidine gluconate and 70% isopropyl alcohol) Safety Precautions: Aspiration looking for blood return was conducted prior to all injections. At no point did we inject any substances, as a needle was being advanced. No attempts were made at seeking any paresthesias. Safe injection practices and needle disposal techniques used. Medications properly checked for expiration dates. SDV (single dose vial) medications used.   Description of the Procedure: Protocol guidelines were followed. The patient was placed in position over the fluoroscopy table. The target area was identified and the  area prepped in the usual manner. Skin desensitized using vapocoolant spray. Skin & deeper tissues infiltrated with local anesthetic. Appropriate amount of time allowed to pass for local  anesthetics to take effect. Radiofrequency needles were introduced to the area of the medial branch at the junction of the superior articular process and transverse process using fluoroscopy. Using the [employer], sensory stimulation using 50 Hz was used to locate & identify the nerve, making sure that the needle was positioned such that there was no sensory stimulation below 0.3 V or above 0.7 V. Stimulation using 2 Hz was used to evaluate the motor component. Care was taken not to lesion any nerves that demonstrated motor stimulation of the lower extremities at an output of less than 2.5 times that of the sensory threshold, or a maximum of 2.0 V. Once satisfactory placement of the needles was achieved, the above solution was slowly injected after negative aspiration. After waiting for at least 2 minutes, the ablation was performed at 80 degrees C for 60 seconds.The needles were then removed and the area cleansed, making sure to leave some of the prepping solution back to take advantage of its long term bactericidal properties. EBL: None Materials & Medications Used:  Needle(s) Used: Teflon-Coated Radiofrequency Needles  Imaging Guidance:   Type of Imaging Technique: Fluoroscopy Guidance (Spinal) Indication(s): Assistance in needle guidance and placement for procedures requiring needle placement in or near specific anatomical locations not easily accessible without such assistance. Exposure Time: Please see nurses notes. Contrast: None required. Fluoroscopic Guidance: I was personally present in the fluoroscopy suite, where the patient was placed in position for the procedure, over the fluoroscopy-compatible table. Fluoroscopy was manipulated, using "Tunnel Vision Technique", to obtain the best  possible view of the target area, on the affected side. Parallax error was corrected before commencing the procedure. A "direction-depth-direction" technique was used to introduce the needle under continuous pulsed fluoroscopic guidance. Once the target was reached, antero-posterior, oblique, and lateral fluoroscopic projection views were taken to confirm needle placement in all planes. Permanently recorded images stored by scanning into EMR. Interpretation: Intraoperative imaging interpretation by performing Physician. Adequate needle placement confirmed.  Antibiotic Prophylaxis:  Indication(s): No indications identified. Type:  Antibiotics Given (last 72 hours)    None       Post-operative Assessment:   Complications: No immediate post-treatment complications were observed Disposition: Return to clinic for follow-up evaluation. The patient tolerated the entire procedure well. A repeat set of vitals were taken after the procedure and the patient was kept under observation following institutional policy, for this procedure. Post-procedural neurological assessment was performed, showing return to baseline, prior to discharge. The patient was discharged home, once institutional criteria were met. The patient was provided with post-procedure discharge instructions, including a section on how to identify potential problems. Should any problems arise concerning this procedure, the patient was given instructions to immediately contact us, at any time, without hesitation. In any case, we plan to contact the patient by telephone for a follow-up status report regarding this interventional procedure. Comments:  No additional relevant information  Medications administered during this visit: We administered ropivacaine (PF) 2 mg/ml (0.2%), triamcinolone acetonide, fentaNYL, and midazolam.  Prescriptions ordered during this visit: New Prescriptions   No medications on file    Requested PM Follow-up:  Return in about 6 weeks (around 02/04/2016) for Post-procedure (RFA) FU (6-wks).  Future Appointments Date Time Provider [place]  01/01/2016 10:00 AM Bayard Hugger, NP [facility]  02/03/2016 9:40 AM Milinda Pointer, MD [facility] None    Primary Care Physician: Pcp Not In System Location: [facility] Note by: Kathlen Brunswick. Dossie Arbour, M.D, DABA,  DABAPM, DABPM, DABIPP, FIPP Disclaimer:  Medicine is not an Chief Strategy Officer. The only guarantee in medicine is that nothing is guaranteed. It is important to note that the decision to proceed with this intervention was based on the information collected from the patient. The Data and conclusions were drawn from the patient's questionnaire, the interview, and the physical examination. Because the information was provided in large part by the patient, it cannot be guaranteed that it has not been purposely or unconsciously manipulated. Every effort has been made to obtain as much relevant data as possible for this evaluation. It is important to note that the conclusions that lead to this procedure are derived in large part from the available data. Always take into account that the treatment will also be dependent on availability of resources and existing treatment guidelines, considered by other Pain Management Practitioners as being common knowledge and practice, at the time of the intervention. For Medico-Legal purposes, it is also important to point out that variation in procedural techniques and pharmacological choices are the acceptable norm. The indications, contraindications, technique, and results of the above procedure should only be interpreted and judged by a Board-Certified Interventional Pain Specialist with extensive familiarity and expertise in the same exact procedure and technique. Attempts at providing opinions without similar or greater experience and expertise than that of the treating physician will be  considered as inappropriate and unethical, and shall result in a formal complaint to the state medical board and applicable specialty societies.

## 2015-12-25 ENCOUNTER — Telehealth: Payer: Self-pay | Admitting: *Deleted

## 2015-12-25 NOTE — Telephone Encounter (Signed)
Spoke with patient denies any questions or concerns re; procedures on yesterday.

## 2015-12-31 NOTE — Telephone Encounter (Signed)
Placed a call to Samuel [name], He had Therapeutic Medial Branch Facet Radiofrequency Ablation on 12/24/2015 by Dr. Dossie Arbour at [facility]. States he has received some relief of his pain. Placed a return call to  Samuel [name]  from [facility], went to her voice mail. Samuel [name] states he was given a prescription from Dr. Dossie Arbour he never filled it, he will bring it with him in the morning.

## 2016-01-01 ENCOUNTER — Encounter: Payer: Self-pay | Admitting: Registered Nurse

## 2016-01-01 ENCOUNTER — Encounter: Payer: PPO | Attending: Physical Medicine & Rehabilitation | Admitting: Registered Nurse

## 2016-01-01 VITALS — BP 127/79 | HR 63 | Resp 16

## 2016-01-01 DIAGNOSIS — G8928 Other chronic postprocedural pain: Secondary | ICD-10-CM | POA: Diagnosis not present

## 2016-01-01 DIAGNOSIS — Z981 Arthrodesis status: Secondary | ICD-10-CM | POA: Diagnosis not present

## 2016-01-01 DIAGNOSIS — M961 Postlaminectomy syndrome, not elsewhere classified: Secondary | ICD-10-CM | POA: Insufficient documentation

## 2016-01-01 DIAGNOSIS — M5416 Radiculopathy, lumbar region: Secondary | ICD-10-CM | POA: Diagnosis not present

## 2016-01-01 DIAGNOSIS — M79605 Pain in left leg: Secondary | ICD-10-CM | POA: Insufficient documentation

## 2016-01-01 DIAGNOSIS — Z5181 Encounter for therapeutic drug level monitoring: Secondary | ICD-10-CM

## 2016-01-01 DIAGNOSIS — M545 Low back pain: Secondary | ICD-10-CM | POA: Diagnosis not present

## 2016-01-01 DIAGNOSIS — G8929 Other chronic pain: Secondary | ICD-10-CM

## 2016-01-01 DIAGNOSIS — M79604 Pain in right leg: Secondary | ICD-10-CM | POA: Insufficient documentation

## 2016-01-01 DIAGNOSIS — G894 Chronic pain syndrome: Secondary | ICD-10-CM | POA: Diagnosis not present

## 2016-01-01 DIAGNOSIS — M62830 Muscle spasm of back: Secondary | ICD-10-CM

## 2016-01-01 DIAGNOSIS — Z79899 Other long term (current) drug therapy: Secondary | ICD-10-CM

## 2016-01-01 MED ORDER — OXYCODONE-ACETAMINOPHEN 10-325 MG PO TABS: 1.0000 | ORAL_TABLET | Freq: Four times a day (QID) | ORAL | 0 refills | Status: DC | PRN

## 2016-01-01 MED ORDER — MORPHINE SULFATE ER 30 MG PO TBCR: 30.0000 mg | EXTENDED_RELEASE_TABLET | Freq: Two times a day (BID) | ORAL | 0 refills | Status: DC

## 2016-01-01 NOTE — Progress Notes (Signed)
Subjective:    Patient ID: Samuel [name], male    DOB: May 17, 1976, 40 y.o.   MRN: [id number]  HPI:  [name] is a 40 year old male who returns for follow up for chronic pain and medication refill. He states his pain is located in his lower back radiating into his left lower extremity laterally.He rates his pain 5. His current exercise regime is performing stretching exercises and walking short distances.  [name] seen Dr. Dossie Arbour on August 1,2017, for Right-sided Lumbar Facet Radiofrequency Ablation under fluoroscopic guidance with IV sedation, relief noted from procedure. [name] returned Dr. Dossie Arbour prescription it was discarded.  Pain Inventory Average Pain 6 Pain Right Now 5 My pain is constant  In the last 24 hours, has pain interfered with the following? General activity 8 Relation with others 8 Enjoyment of life 9 What TIME of day is your pain at its worst? morning evening and night Sleep (in general) Fair  Pain is worse with: walking, bending, sitting and standing Pain improves with: rest and medication Relief from Meds: 5  Mobility how many minutes can you walk? 5 ability to climb steps?  yes do you drive?  yes  Function disabled: date disabled 2010 I need assistance with the following:  meal prep, household duties and shopping  Neuro/Psych No problems in this area  Prior Studies Any changes since last visit?  yes had injection with Dr Dossie Arbour 12/24/15  Physicians involved in your care Any changes since last visit?  yes Dr Dossie Arbour   Family History  Problem Relation Age of Onset  . Hypertension Father    Social History   Social History  . Marital status: Married    Spouse name: N/A  . Number of children: N/A  . Years of education: N/A   Social History Main Topics  . Smoking status: Never Smoker  . Smokeless tobacco: None  . Alcohol use No  . Drug use: No  . Sexual activity: Not Asked   Other Topics Concern  . None   Social History  Narrative  . None   Past Surgical History:  Procedure Laterality Date  . BACK SURGERY     lower back surgery x 2   Past Medical History:  Diagnosis Date  . Arthritis    "some in my back"  . GERD (gastroesophageal reflux disease)   . Headache(784.0)    BP 127/79 (BP Location: Left Arm, Patient Position: Sitting, Cuff Size: Large)   Pulse 63   Resp 16   SpO2 97%   Opioid Risk Score:   Fall Risk Score:  `1  Depression screen PHQ 2/9  Depression screen [facility] 2/9 01/01/2016 11/05/2015 09/05/2015 08/05/2015 06/06/2015 03/04/2015 02/04/2015  Decreased Interest 0 0 0 1 0 0 0  Down, Depressed, Hopeless 0 0 0 1 0 0 0  PHQ - 2 Score 0 0 0 2 0 0 0  Altered sleeping - - - 2 - - -  Tired, decreased energy - - - 2 - - -  Change in appetite - - - 1 - - -  Feeling bad or failure about yourself  - - - 2 - - -  Trouble concentrating - - - 1 - - -  Moving slowly or fidgety/restless - - - 0 - - -  Suicidal thoughts - - - 0 - - -  PHQ-9 Score - - - 10 - - -  Difficult doing work/chores - - - Somewhat difficult - - -  Review of Systems  All other systems reviewed and are negative.      Objective:   Physical Exam  Constitutional: He is oriented to person, place, and time. He appears well-developed and well-nourished.  HENT:  Head: Normocephalic and atraumatic.  Neck: Normal range of motion. Neck supple.  Cardiovascular: Normal rate and regular rhythm.   Pulmonary/Chest: Effort normal and breath sounds normal.  Musculoskeletal:  Normal Muscle Bulk and Muscle Testing Reveals: Upper Extremities: Full ROM and Muscle Strength 5/5 Lumbar Hypersensitivity Lower Extremities: Full ROM and Muscle Strength 5/5 Arises from chair slowly Antalgic gait  Neurological: He is alert and oriented to person, place, and time.  Skin: Skin is warm and dry.  Psychiatric: He has a normal mood and affect.  Nursing note and vitals reviewed.         Assessment & Plan:  1. Lumbar postlaminectomy syndrome  with chronic low back and radicular pain. He has lumbar degenerative disc disease. Continue Gabapentin :   Refilled: MS Contin 30 mg one tablet every 12 hours #60 and Oxycodone 10/325 mg one tablet every 6 hours as needed for pain #120.  We will continue the opioid monitoring program, this consists of regular clinic visits, examinations, urine drug screen, pill counts as well as use of [place] Controlled Substance reporting System. S/P Right-sided Lumbar Facet Radiofrequency Ablation under fluoroscopic guidance with IV sedation on August 1,2017by Dr. Consuela Mimes with relief noted. 2. Sacroiliac Dysfunction: Continue with current medication, heat and exercise regime 3. Muscle Spasm: Continue Robaxin 20 minutes of face to face patient care time was spent during this visit. All questions were encouraged and answered.  F/U in 1 month.

## 2016-01-28 DIAGNOSIS — Z6841 Body Mass Index (BMI) 40.0 and over, adult: Secondary | ICD-10-CM | POA: Diagnosis not present

## 2016-01-28 DIAGNOSIS — M545 Low back pain: Secondary | ICD-10-CM | POA: Diagnosis not present

## 2016-01-30 ENCOUNTER — Encounter: Payer: Self-pay | Admitting: Physical Medicine & Rehabilitation

## 2016-01-30 ENCOUNTER — Encounter: Payer: PPO | Attending: Physical Medicine & Rehabilitation

## 2016-01-30 ENCOUNTER — Ambulatory Visit (HOSPITAL_BASED_OUTPATIENT_CLINIC_OR_DEPARTMENT_OTHER): Payer: Medicare Other | Admitting: Physical Medicine & Rehabilitation

## 2016-01-30 VITALS — BP 114/77 | HR 78 | Resp 14

## 2016-01-30 DIAGNOSIS — M62830 Muscle spasm of back: Secondary | ICD-10-CM | POA: Diagnosis not present

## 2016-01-30 DIAGNOSIS — G894 Chronic pain syndrome: Secondary | ICD-10-CM

## 2016-01-30 DIAGNOSIS — M79605 Pain in left leg: Secondary | ICD-10-CM | POA: Insufficient documentation

## 2016-01-30 DIAGNOSIS — M5416 Radiculopathy, lumbar region: Secondary | ICD-10-CM | POA: Diagnosis not present

## 2016-01-30 DIAGNOSIS — M79604 Pain in right leg: Secondary | ICD-10-CM | POA: Diagnosis not present

## 2016-01-30 DIAGNOSIS — M961 Postlaminectomy syndrome, not elsewhere classified: Secondary | ICD-10-CM | POA: Insufficient documentation

## 2016-01-30 DIAGNOSIS — G8928 Other chronic postprocedural pain: Secondary | ICD-10-CM | POA: Insufficient documentation

## 2016-01-30 DIAGNOSIS — Z79899 Other long term (current) drug therapy: Secondary | ICD-10-CM

## 2016-01-30 DIAGNOSIS — Z981 Arthrodesis status: Secondary | ICD-10-CM | POA: Insufficient documentation

## 2016-01-30 DIAGNOSIS — Z5181 Encounter for therapeutic drug level monitoring: Secondary | ICD-10-CM

## 2016-01-30 DIAGNOSIS — M545 Low back pain: Secondary | ICD-10-CM | POA: Diagnosis not present

## 2016-01-30 DIAGNOSIS — M5136 Other intervertebral disc degeneration, lumbar region: Secondary | ICD-10-CM

## 2016-01-30 DIAGNOSIS — M533 Sacrococcygeal disorders, not elsewhere classified: Secondary | ICD-10-CM | POA: Diagnosis not present

## 2016-01-30 DIAGNOSIS — G8929 Other chronic pain: Secondary | ICD-10-CM

## 2016-01-30 MED ORDER — OXYCODONE-ACETAMINOPHEN 10-325 MG PO TABS
1.0000 | ORAL_TABLET | Freq: Four times a day (QID) | ORAL | 0 refills | Status: DC | PRN
Start: 2016-01-30 — End: 2016-02-28

## 2016-01-30 MED ORDER — MORPHINE SULFATE ER 30 MG PO TBCR: 30.0000 mg | EXTENDED_RELEASE_TABLET | Freq: Two times a day (BID) | ORAL | 0 refills | Status: DC

## 2016-01-30 NOTE — Progress Notes (Signed)
Subjective:    Patient ID: Samuel [name], male    DOB: 1976/03/26, 40 y.o.   MRN: [id number]  HPI Underwent L2,3,4,5,S1 RF under IV sedation, Seem to relieve pain at about 25% for a couple weeks, but pain has recurred to normal levels   Has seen Dr. [name] from neurosurgery who noted there was a broken screw Past surgical history L3-S1 fusion, last MRI was postoperative approximate 10 months postop. In November 2015 did not show any abnormalities of the pedicle screws. Pain Inventory Average Pain 6 Pain Right Now 6 My pain is constant, sharp, dull, stabbing and aching  In the last 24 hours, has pain interfered with the following? General activity 9 Relation with others 9 Enjoyment of life 9 What TIME of day is your pain at its worst? all Sleep (in general) Fair  Pain is worse with: walking, bending, sitting and standing Pain improves with: rest and medication Relief from Meds: 5  Mobility walk without assistance use a cane how many minutes can you walk? 5 ability to climb steps?  yes do you drive?  yes  Function disabled: date disabled 2010 I need assistance with the following:  dressing, bathing and meal prep  Neuro/Psych weakness trouble walking spasms  Prior Studies Any changes since last visit?  no  Physicians involved in your care Any changes since last visit?  no   Family History  Problem Relation Age of Onset  . Hypertension Father    Social History   Social History  . Marital status: Married    Spouse name: N/A  . Number of children: N/A  . Years of education: N/A   Social History Main Topics  . Smoking status: Never Smoker  . Smokeless tobacco: Never Used  . Alcohol use No  . Drug use: No  . Sexual activity: Not Asked   Other Topics Concern  . None   Social History Narrative  . None   Past Surgical History:  Procedure Laterality Date  . BACK SURGERY     lower back surgery x 2   Past Medical History:  Diagnosis Date  . Arthritis     "some in my back"  . GERD (gastroesophageal reflux disease)   . Headache(784.0)    BP 114/77 (BP Location: Right Arm, Patient Position: Sitting, Cuff Size: Large)   Pulse 78   Resp 14   SpO2 96%   Opioid Risk Score:   Fall Risk Score:  `1  Depression screen PHQ 2/9  Depression screen [facility] 2/9 01/30/2016 01/01/2016 11/05/2015 09/05/2015 08/05/2015 06/06/2015 03/04/2015  Decreased Interest 0 0 0 0 1 0 0  Down, Depressed, Hopeless 0 0 0 0 1 0 0  PHQ - 2 Score 0 0 0 0 2 0 0  Altered sleeping - - - - 2 - -  Tired, decreased energy - - - - 2 - -  Change in appetite - - - - 1 - -  Feeling bad or failure about yourself  - - - - 2 - -  Trouble concentrating - - - - 1 - -  Moving slowly or fidgety/restless - - - - 0 - -  Suicidal thoughts - - - - 0 - -  PHQ-9 Score - - - - 10 - -  Difficult doing work/chores - - - - Somewhat difficult - -    Review of Systems  All other systems reviewed and are negative.      Objective:   Physical Exam  Constitutional: He  is oriented to person, place, and time. He appears well-developed.  HENT:  Head: Normocephalic and atraumatic.  Eyes: Conjunctivae and EOM are normal. Pupils are equal, round, and reactive to light.  Neck: Normal range of motion.  Neurological: He is alert and oriented to person, place, and time.  Psychiatric: His affect is blunt.  Nursing note and vitals reviewed.   Patient is an obese male in no acute distress. He exhibits pain behaviors with change in positioning. He has pain with light palpation of his lower lumbar area. He has negative straight leg raising. Motor strength is 5/5 bilateral, knee extensors, some pain inhibition with bilateral hip flexor testing, but still 4/5, ankle dorsiflexor are 5/5       Assessment & Plan:  1. Lumbar postlaminectomy syndrome, status post multilevel fusion L3-S1, underwent trial of right lumbar radiofrequency  by anesthesiology. This was not helpful.  Patient plans follow-up with his  neurosurgeon. He was seen fairly recently and was told one of his pedicle screws had fractured. I do not have these notes. His pain is rather diffuse and also occurs with even minimal palpation. Continue current pain medication regimen MS Contin 30 mg every 12 hours Oxycodone 10 mg every 6 hours when necessary We discussed other pain medications including a substitution of Nucynta for either his long-acting or short-acting. He will check with his pharmacy in terms of his insurance coverage for this medication.  We also discussed percutaneous electrical nerve stimulation If no other interventional pain procedures are recommended by his anesthesiologist may proceed with this.  Over half of the 25 min visit was spent counseling and coordinating care.

## 2016-01-30 NOTE — Patient Instructions (Signed)
If Dr. Delane Ginger is not planning other procedures. She may be a good candidate for percutaneous electrical nerve stimulation. Please talk to Samuel [name] about that next month. We also discussed trying a medicine called Nucynta if insurance covers

## 2016-02-03 ENCOUNTER — Ambulatory Visit: Payer: PPO | Attending: Pain Medicine | Admitting: Pain Medicine

## 2016-02-03 ENCOUNTER — Encounter: Payer: Self-pay | Admitting: Pain Medicine

## 2016-02-03 VITALS — BP 137/68 | HR 57 | Temp 98.5°F | Resp 18 | Ht 73.0 in | Wt 315.0 lb

## 2016-02-03 DIAGNOSIS — M545 Low back pain: Secondary | ICD-10-CM | POA: Diagnosis not present

## 2016-02-03 DIAGNOSIS — M79605 Pain in left leg: Secondary | ICD-10-CM | POA: Insufficient documentation

## 2016-02-03 DIAGNOSIS — Z79891 Long term (current) use of opiate analgesic: Secondary | ICD-10-CM | POA: Diagnosis not present

## 2016-02-03 DIAGNOSIS — M533 Sacrococcygeal disorders, not elsewhere classified: Secondary | ICD-10-CM | POA: Insufficient documentation

## 2016-02-03 DIAGNOSIS — M961 Postlaminectomy syndrome, not elsewhere classified: Secondary | ICD-10-CM | POA: Insufficient documentation

## 2016-02-03 DIAGNOSIS — M5116 Intervertebral disc disorders with radiculopathy, lumbar region: Secondary | ICD-10-CM | POA: Diagnosis not present

## 2016-02-03 DIAGNOSIS — G8918 Other acute postprocedural pain: Secondary | ICD-10-CM | POA: Insufficient documentation

## 2016-02-03 DIAGNOSIS — G8929 Other chronic pain: Secondary | ICD-10-CM | POA: Diagnosis not present

## 2016-02-03 DIAGNOSIS — M47816 Spondylosis without myelopathy or radiculopathy, lumbar region: Secondary | ICD-10-CM

## 2016-02-03 DIAGNOSIS — M79604 Pain in right leg: Secondary | ICD-10-CM | POA: Insufficient documentation

## 2016-02-03 DIAGNOSIS — Z981 Arthrodesis status: Secondary | ICD-10-CM | POA: Insufficient documentation

## 2016-02-03 NOTE — Progress Notes (Signed)
Patient's Name: Samuel [name]  MRN: [id number]  Referring Provider: No ref. provider found  DOB: March 24, 1976  PCP: Pcp Not In System  DOS: 02/03/2016  Note by: Demetrie Borge A. Dossie Arbour, MD  Service setting: Ambulatory outpatient  Specialty: Interventional Pain Management  Location: [facility] (AMB) Pain Management Facility    Patient type: Established   Primary Reason(s) for Visit: Encounter for post-procedure evaluation of chronic illness with mild to moderate exacerbation CC: Back Pain (lower)   HPI  Mr. Quigg is a 40 y.o. year old, male patient, who returns today as an established patient. He has Lumbar DDD; Lumbar post-laminectomy syndrome; Chronic lumbar radicular pain (Location of Secondary source of pain) (Bilateral) (R>L) (L5 dermatome); Sacroiliac dysfunction; Lumbar facet syndrome (Location of Primary Source of Pain) (Bilateral) (R>L); Chronic low back pain (Location of Primary Source of Pain) (Bilateral) (R>L); Lumbar spondylosis; Chronic pain; Long term current use of opiate analgesic; Long term prescription opiate use; Opiate use (120 MME/Day); Encounter for pain management planning; Chronic lower extremity pain (Location of Secondary source of pain) (Bilateral) (R>L); 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 His primarily concern today is the Back Pain (lower)  Pain Assessment: Self-Reported Pain Score: 5              Reported level is compatible with observation.       Pain Type: Chronic pain Pain Orientation: Right, Left, Lower Pain Frequency: Constant  The patient comes into the clinics today for post-procedure evaluation on the interventional treatment done on 12/24/2015.  Date of Last Visit: 12/24/15 Service Provided on Last Visit: Procedure (right sided RF)  Post-Procedure Assessment  Procedure done on 12/24/2015: Right-sided lumbar  facet radiofrequency ablation under fluoroscopic guidance and IV sedation Complications experienced at the time of the procedure: None Side-effects or Adverse reactions: None reported Sedation: Sedation provided. When no sedatives are used, the analgesic levels obtained are directly associated with the effectiveness of the local anesthetics. On the other hand, when sedation is provided, the level of analgesia obtained during the initial 1 hour immediately following the intervention, is believed to be the result of a combination of factors. These factors may include, but are not limited to: 1. The effectiveness of the local anesthetics used. 2. The effects of the analgesic(s) and/or anxiolytic(s) used. 3. The degree of discomfort experienced by the patient at the time of the procedure. 4. The patients ability and reliability in recalling and recording the events. 5. The presence and influence of possible secondary gains. Results: Ultra-Short Term Relief (First 1 hour after procedure): 75 %  Interpretation note: Analgesia during this period is likely to be Local Anesthetic and/or IV Sedative (Analgesic/Anxiolitic) related Local Anesthesia: Long-acting (4-6 hours) anesthetics used. The analgesic levels attained during this period are directly associated to the localized infiltration of local anesthetics and therefore cary significant diagnostic value as to the etiological location or origin of the pain. Results: Short Term Relief (Initial 4-6 hrs after procedure): 75 % Interpretation note: Complete relief confirms area to be the source of pain Long-Term Therapy: Steroids used. Results: Long Term Relief : (S) 25 % (over the last week pain has started coming back) Interpretation note: Long-term benefit would suggest an inflammatory etiology to the pain         Long-Term Benefits:  Current Relief (Now): 25%  Interpretation note: Long-term benefit could signal adequate neurolysis Interpretation  of  Results: The patient has problems bilaterally, suggesting that until we get both sides done, he will not attain complete benefit. However, the fact that he only got 75% relief of the pain for the duration of the local anesthetic would suggest that there is still on all her source of pain in the area.  Laboratory Chemistry  Inflammation Markers No results found for: ESRSEDRATE, CRP  Renal Function Lab Results  Component Value Date   BUN 9 06/13/2013   CREATININE 0.97 06/13/2013   GFRAA >90 06/13/2013   GFRNONAA >90 06/13/2013    Hepatic Function Lab Results  Component Value Date   AST 36 06/02/2013   ALT 47 06/02/2013   ALBUMIN 3.5 06/02/2013    Electrolytes Lab Results  Component Value Date   NA 136 (L) 06/13/2013   K 4.4 06/13/2013   CL 100 06/13/2013   CALCIUM 8.1 (L) 06/13/2013    Pain Modulating Vitamins No results found for: VD25OH, [id number], H157544, [id number], 25OHVITD1, 25OHVITD2, 25OHVITD3, VITAMINB12  Coagulation Parameters Lab Results  Component Value Date   PLT 138 (L) 06/13/2013    Cardiovascular Lab Results  Component Value Date   HGB 11.6 (L) 06/13/2013   HCT 34.1 (L) 06/13/2013   Note: Lab results reviewed.  Recent Diagnostic Imaging  Ct Lumbar Spine Wo Contrast  Result Date: 04/18/2014 CLINICAL DATA:  Low back pain with bilateral leg pain for many years EXAM: CT LUMBAR SPINE WITHOUT CONTRAST TECHNIQUE: Multidetector CT imaging of the lumbar spine was performed without intravenous contrast administration. Multiplanar CT image reconstructions were also generated. COMPARISON:  MRI lumbar spine 04/18/2014, 03/26/2013 FINDINGS: The vertebral body heights are maintained. The alignment is anatomic. The paravertebral soft tissues are normal. There is no fracture or static listhesis. There is no spondylolysis. The visualized portion of the SI joints are unremarkable. There is posterior lumbar interbody fusion from L3 through S1 with bilateral pedicle  screws at each level without failure or complication. There is incomplete fusion of the posterior elements of L5-S1. There is a solid osseous fusion of the posterior elements at L3-4 and L4-5. L1-L2: No significant disc bulge, foraminal stenosis or central canal stenosis. L2-L3: Mild broad-based disc bulge. No foraminal or central canal stenosis. L3-L4: Interbody fusion.  No foraminal or central canal stenosis. L4-L5: Interbody fusion.  No foraminal or central canal stenosis. L5-S1: Interbody fusion.  No foraminal or central canal stenosis. IMPRESSION: 1. Posterior lumbar interbody fusion from L3 through S1 with bilateral pedicle screws at each well. Incomplete fusion of the posterior elements of L5-S1 bilaterally. Electronically Signed   By: Kathreen Devoid   On: 04/18/2014 11:15   Mr Lumbar Spine W Wo Contrast  Result Date: 04/18/2014 CLINICAL DATA:  Low back pain, bilateral leg pain, surgery x3 most recently in January 2015. No improvement since last surgery. No radiculopathy. EXAM: MRI LUMBAR SPINE WITHOUT AND WITH CONTRAST TECHNIQUE: Multiplanar and multiecho pulse sequences of the lumbar spine were obtained without and with intravenous contrast. CONTRAST:  40mL MULTIHANCE GADOBENATE DIMEGLUMINE 529 MG/ML IV SOLN COMPARISON:  03/26/2013 FINDINGS: The vertebral bodies of the lumbar spine are normal in size. The vertebral bodies of the lumbar spine are normal in alignment. There is normal bone marrow signal demonstrated throughout the vertebra. There is posterior lumbar interbody fusion from L3 through S1 with bilateral pedicle screws at each level without failure or complication. There are postsurgical changes in the posterior paraspinal soft tissues. There are no areas of abnormal enhancement. The spinal cord is normal  in signal and contour. The cord terminates normally at T12 . The nerve roots of the cauda equina and the filum terminale are normal. The visualized portions of the SI joints are unremarkable.  The imaged intra-abdominal contents are unremarkable. T12-L1: Small central disc protrusion. No evidence of neural foraminal stenosis. No central canal stenosis. L1-L2: No significant disc bulge. No evidence of neural foraminal stenosis. No central canal stenosis. L2-L3: Mild broad-based disc bulge flattening the ventral thecal sac. No evidence of neural foraminal stenosis. No central canal stenosis. L3-L4: Interbody fusion. No evidence of neural foraminal stenosis. No central canal stenosis. L4-L5: Interbody fusion. No evidence of neural foraminal stenosis. No central canal stenosis. L5-S1: Interbody fusion. No evidence of neural foraminal stenosis. No central canal stenosis. IMPRESSION: 1. Posterior lumbar interbody fusion from L3 through S1 without recurrent foraminal or central canal stenosis. 2. At L2-3 there is a mild broad-based disc bulge flattening the ventral thecal sac. 3. At T12-L1 there is a small central disc protrusion. Electronically Signed   By: Kathreen Devoid   On: 04/18/2014 10:28    Meds  The patient has a current medication list which includes the following prescription(s): gabapentin, ibuprofen, morphine, oxycodone-acetaminophen, and OVER THE COUNTER MEDICATION.  Current Outpatient Prescriptions on File Prior to Visit  Medication Sig  . gabapentin (NEURONTIN) 300 MG capsule Take one Capsule in the Morning and Two Capsules at Bedtime  . ibuprofen (ADVIL,MOTRIN) 800 MG tablet Take 1 tablet (800 mg total) by mouth every 8 (eight) hours as needed.  Marland Kitchen morphine (MS CONTIN) 30 MG 12 hr tablet Take 1 tablet (30 mg total) by mouth every 12 (twelve) hours.  Marland Kitchen oxyCODONE-acetaminophen (PERCOCET) 10-325 MG tablet Take 1 tablet by mouth every 6 (six) hours as needed.  Marland Kitchen OVER THE COUNTER MEDICATION Take 2 tablets by mouth daily. Generic Brand Stool Softner   No current facility-administered medications on file prior to visit.     ROS  Constitutional: Denies any fever or  chills Gastrointestinal: No reported hemesis, hematochezia, vomiting, or acute GI distress Musculoskeletal: Denies any acute onset joint swelling, redness, loss of ROM, or weakness Neurological: No reported episodes of acute onset apraxia, aphasia, dysarthria, agnosia, amnesia, paralysis, loss of coordination, or loss of consciousness  Allergies  Mr. Krenz has No Known Allergies.  [place]  Medical:  Mr. Pinal  has a past medical history of Arthritis; GERD (gastroesophageal reflux disease); and Headache(784.0). Family: family history includes Hypertension in his father. Surgical:  has a past surgical history that includes Back surgery. Tobacco:  reports that he has never smoked. He has never used smokeless tobacco. Alcohol:  reports that he does not drink alcohol. Drug:  reports that he does not use drugs.  Constitutional Exam  General appearance: Well nourished, well developed, and well hydrated. In no acute distress Vitals:   02/03/16 0954  BP: 137/68  Pulse: (!) 57  Resp: 18  Temp: 98.5 F (36.9 C)  SpO2: 100%  Weight: (!) 315 lb (142.9 kg)  Height: 6\' 1"  (1.854 m)  BMI Assessment: Estimated body mass index is 41.56 kg/m as calculated from the following:   Height as of this encounter: 6\' 1"  (1.854 m).   Weight as of this encounter: 315 lb (142.9 kg).   BMI interpretation: (>40 kg/m2) = Extreme obesity (Class III): This range is associated with a 254% higher incidence of chronic pain. BMI Readings from Last 4 Encounters:  02/03/16 41.56 kg/m  12/24/15 42.88 kg/m  10/29/15 42.88 kg/m  09/05/15 42.22  kg/m   Wt Readings from Last 4 Encounters:  02/03/16 (!) 315 lb (142.9 kg)  12/24/15 (!) 325 lb (147.4 kg)  10/29/15 (!) 325 lb (147.4 kg)  09/05/15 (!) 320 lb (145.2 kg)  Psych/Mental status: Alert and oriented x 3 (person, place, & time) Eyes: PERLA Respiratory: No evidence of acute respiratory distress  Cervical Spine Exam  Inspection: No masses, redness, or  swelling Alignment: Symmetrical Functional ROM: ROM appears unrestricted Stability: No instability detected Muscle strength & Tone: Functionally intact Sensory: Unimpaired Palpation: Non-contributory  Upper Extremity (UE) Exam    Side: Right upper extremity  Side: Left upper extremity  Inspection: No masses, redness, swelling, or asymmetry  Inspection: No masses, redness, swelling, or asymmetry  Functional ROM: ROM appears unrestricted          Functional ROM: ROM appears unrestricted          Muscle strength & Tone: Functionally intact  Muscle strength & Tone: Functionally intact  Sensory: Unimpaired  Sensory: Unimpaired  Palpation: Non-contributory  Palpation: Non-contributory   Thoracic Spine Exam  Inspection: No masses, redness, or swelling Alignment: Symmetrical Functional ROM: ROM appears unrestricted Stability: No instability detected Sensory: Unimpaired Muscle strength & Tone: Functionally intact Palpation: Non-contributory  Lumbar Spine Exam  Inspection: No masses, redness, or swelling Alignment: Symmetrical Functional ROM: Decreased ROM Stability: No instability detected Muscle strength & Tone: Functionally intact Sensory: Movement-associated pain Palpation: Complains of area being tender to palpation Provocative Tests: Lumbar Hyperextension and rotation test: Positive bilaterally for facet joint pain. Patrick's Maneuver: evaluation deferred today              Gait & Posture Assessment  Ambulation: Unassisted Gait: Relatively normal for age and body habitus Posture: WNL   Lower Extremity Exam    Side: Right lower extremity  Side: Left lower extremity  Inspection: No masses, redness, swelling, or asymmetry  Inspection: No masses, redness, swelling, or asymmetry  Functional ROM: ROM appears unrestricted          Functional ROM: ROM appears unrestricted          Muscle strength & Tone: Functionally intact  Muscle strength & Tone: Functionally intact  Sensory:  Unimpaired  Sensory: Unimpaired  Palpation: Non-contributory  Palpation: Non-contributory    Assessment & Plan  Primary Diagnosis & Pertinent Problem List: The primary encounter diagnosis was Chronic low back pain (Location of Primary Source of Pain) (Bilateral) (R>L). A diagnosis of Lumbar facet syndrome (Location of Primary Source of Pain) (Bilateral) (R>L) was also pertinent to this visit.  Visit Diagnosis: 1. Chronic low back pain (Location of Primary Source of Pain) (Bilateral) (R>L)   2. Lumbar facet syndrome (Location of Primary Source of Pain) (Bilateral) (R>L)     Problem-specific Plan(s): No problem-specific Assessment & Plan notes found for this encounter.   Plan of Care   Problem List Items Addressed This Visit      High   Chronic low back pain (Location of Primary Source of Pain) (Bilateral) (R>L) - Primary (Chronic)   Relevant Orders   Radiofrequency,Lumbar   Lumbar facet syndrome (Location of Primary Source of Pain) (Bilateral) (R>L) (Chronic)   Relevant Orders   Radiofrequency,Lumbar    Other Visit Diagnoses   None.      Pharmacotherapy (Medications Ordered): No orders of the defined types were placed in this encounter.   Lab-work & Procedure Ordered: Orders Placed This Encounter  Procedures  . Radiofrequency,Lumbar    Standing Status:   Future    Standing  Expiration Date:   08/02/2016    Scheduling Instructions:     Side(s): Left-sided     Level(s): L2, L3, L4, L5, & S1 Medial Branch Nerve(s)     Sedation: With Sedation     Scheduling Timeframe: As soon as pre-approved    Order Specific Question:   Where will this procedure be performed?    Answer:   [facility]    Imaging Ordered: None  Interventional Therapies: Scheduled:  Left sided lumbar facet radiofrequency ablation under fluoroscopic guidance and IV sedation.    Considering:  Complete the radiofrequency on both sides and then reassess what is left.    PRN Procedures:  None  at this time.    Referral(s) or Consult(s): None at this time.  Medications administered during this visit: Mr. Pellicano had no medications administered during this visit.  Requested PM Follow-up: Return for Schedule Procedure, (ASAP).  Future Appointments Date Time Provider [place]  02/28/2016 10:00 AM Bayard Hugger, NP [facility] CPR    Primary Care Physician: Pcp Not In System Location: [facility] Note by: Kathlen Brunswick. Dossie Arbour, M.D, DABA, DABAPM, DABPM, DABIPP, FIPP  Pain Score Disclaimer: We use the NRS-11 scale. This is a self-reported, subjective measurement of pain severity with only modest accuracy. It is used primarily to identify changes within a particular patient. It must be understood that outpatient pain scales are significantly less accurate that those used for research, where they can be applied under ideal controlled circumstances with minimal exposure to variables. In reality, the score is likely to be a combination of pain intensity and pain affect, where pain affect describes the degree of emotional arousal or changes in action readiness caused by the sensory experience of pain. Factors such as social and work situation, setting, emotional state, anxiety levels, expectation, and prior pain experience may influence pain perception and show large inter-individual differences that may also be affected by time variables.  Patient instructions provided during this appointment: Patient Instructions  Radiofrequency Lesioning Radiofrequency lesioning is a procedure that is performed to relieve pain. The procedure is often used for back, neck, or arm pain. Radiofrequency lesioning involves the use of a machine that creates radio waves to make heat. During the procedure, the heat is applied to the nerve that carries the pain signal. The heat damages the nerve and interferes with the pain signal. Pain relief usually lasts for 6 months to 1  year. LET [facility] CARE PROVIDER KNOW ABOUT:  Any allergies you have.  All medicines you are taking, including vitamins, herbs, eye drops, creams, and over-the-counter medicines.  Previous problems you or members of your family have had with the use of anesthetics.  Any blood disorders you have.  Previous surgeries you have had.  Any medical conditions you have.  Whether you are pregnant or may be pregnant. RISKS AND COMPLICATIONS Generally, this is a safe procedure. However, problems may occur, including:  Pain or soreness at the injection site.  Infection at the injection site.  Damage to nerves or blood vessels. BEFORE THE PROCEDURE  Ask your health care provider about:  Changing or stopping your regular medicines. This is especially important if you are taking diabetes medicines or blood thinners.  Taking medicines such as aspirin and ibuprofen. These medicines can thin your blood. Do not take these medicines before your procedure if your health care provider instructs you not to.  Follow instructions from your health care provider about eating or drinking restrictions.  Plan to have someone take you home after the procedure.  If you go home right after the procedure, plan to have someone with you for 24 hours. PROCEDURE  You will be given one or more of the following:  A medicine to help you relax (sedative).  A medicine to numb the area (local anesthetic).  You will be awake during the procedure. You will need to be able to talk with the health care provider during the procedure.  With the help of a type of X-ray (fluoroscopy), the health care provider will insert a radiofrequency needle into the area to be treated.  Next, a wire that carries the radio waves (electrode) will be put through the radiofrequency needle. An electrical pulse will be sent through the electrode to verify the correct nerve. You will feel a tingling sensation, and you may have muscle  twitching.  Then, the tissue that is around the needle tip will be heated by an electric current that is passed using the radiofrequency machine. This will numb the nerves.  A bandage (dressing) will be put on the insertion area after the procedure is done. The procedure may vary among health care providers and hospitals. AFTER THE PROCEDURE  Your blood pressure, heart rate, breathing rate, and blood oxygen level will be monitored often until the medicines you were given have worn off.  Return to your normal activities as directed by your health care provider.   This information is not intended to replace advice given to you by your health care provider. Make sure you discuss any questions you have with your health care provider.   Document Released: 01/07/2011 Document Revised: 01/30/2015 Document Reviewed: 06/18/2014 Elsevier Interactive Patient Education 2016 [employer]   DO NOT EAT OR DRINK FOR 8 HOURS PRIOR TO PROCEDURE BRING A DRIVER

## 2016-02-03 NOTE — Patient Instructions (Signed)
Radiofrequency Lesioning Radiofrequency lesioning is a procedure that is performed to relieve pain. The procedure is often used for back, neck, or arm pain. Radiofrequency lesioning involves the use of a machine that creates radio waves to make heat. During the procedure, the heat is applied to the nerve that carries the pain signal. The heat damages the nerve and interferes with the pain signal. Pain relief usually lasts for 6 months to 1 year. LET [facility] CARE PROVIDER KNOW ABOUT:  Any allergies you have.  All medicines you are taking, including vitamins, herbs, eye drops, creams, and over-the-counter medicines.  Previous problems you or members of your family have had with the use of anesthetics.  Any blood disorders you have.  Previous surgeries you have had.  Any medical conditions you have.  Whether you are pregnant or may be pregnant. RISKS AND COMPLICATIONS Generally, this is a safe procedure. However, problems may occur, including:  Pain or soreness at the injection site.  Infection at the injection site.  Damage to nerves or blood vessels. BEFORE THE PROCEDURE  Ask your health care provider about:  Changing or stopping your regular medicines. This is especially important if you are taking diabetes medicines or blood thinners.  Taking medicines such as aspirin and ibuprofen. These medicines can thin your blood. Do not take these medicines before your procedure if your health care provider instructs you not to.  Follow instructions from your health care provider about eating or drinking restrictions.  Plan to have someone take you home after the procedure.  If you go home right after the procedure, plan to have someone with you for 24 hours. PROCEDURE  You will be given one or more of the following:  A medicine to help you relax (sedative).  A medicine to numb the area (local anesthetic).  You will be awake during the procedure. You will need to be able to  talk with the health care provider during the procedure.  With the help of a type of X-ray (fluoroscopy), the health care provider will insert a radiofrequency needle into the area to be treated.  Next, a wire that carries the radio waves (electrode) will be put through the radiofrequency needle. An electrical pulse will be sent through the electrode to verify the correct nerve. You will feel a tingling sensation, and you may have muscle twitching.  Then, the tissue that is around the needle tip will be heated by an electric current that is passed using the radiofrequency machine. This will numb the nerves.  A bandage (dressing) will be put on the insertion area after the procedure is done. The procedure may vary among health care providers and hospitals. AFTER THE PROCEDURE  Your blood pressure, heart rate, breathing rate, and blood oxygen level will be monitored often until the medicines you were given have worn off.  Return to your normal activities as directed by your health care provider.   This information is not intended to replace advice given to you by your health care provider. Make sure you discuss any questions you have with your health care provider.   Document Released: 01/07/2011 Document Revised: 01/30/2015 Document Reviewed: 06/18/2014 Elsevier Interactive Patient Education 2016 [employer]   DO NOT EAT OR DRINK FOR 8 HOURS PRIOR TO PROCEDURE BRING A DRIVER

## 2016-02-07 LAB — 6-ACETYLMORPHINE,TOXASSURE ADD
6-ACETYLMORPHINE: NEGATIVE
6-acetylmorphine: NOT DETECTED ng/mg creat

## 2016-02-07 LAB — TOXASSURE SELECT,+ANTIDEPR,UR

## 2016-02-12 NOTE — Progress Notes (Signed)
Urine drug screen for this encounter is consistent for prescribed medications.   

## 2016-02-28 ENCOUNTER — Encounter: Payer: Self-pay | Admitting: Registered Nurse

## 2016-02-28 ENCOUNTER — Encounter: Payer: PPO | Attending: Physical Medicine & Rehabilitation | Admitting: Registered Nurse

## 2016-02-28 VITALS — BP 114/76 | HR 66

## 2016-02-28 DIAGNOSIS — M545 Low back pain: Secondary | ICD-10-CM | POA: Insufficient documentation

## 2016-02-28 DIAGNOSIS — Z5181 Encounter for therapeutic drug level monitoring: Secondary | ICD-10-CM

## 2016-02-28 DIAGNOSIS — Z981 Arthrodesis status: Secondary | ICD-10-CM | POA: Diagnosis not present

## 2016-02-28 DIAGNOSIS — Z79899 Other long term (current) drug therapy: Secondary | ICD-10-CM

## 2016-02-28 DIAGNOSIS — M79605 Pain in left leg: Secondary | ICD-10-CM | POA: Diagnosis not present

## 2016-02-28 DIAGNOSIS — G8928 Other chronic postprocedural pain: Secondary | ICD-10-CM | POA: Diagnosis not present

## 2016-02-28 DIAGNOSIS — G894 Chronic pain syndrome: Secondary | ICD-10-CM

## 2016-02-28 DIAGNOSIS — M961 Postlaminectomy syndrome, not elsewhere classified: Secondary | ICD-10-CM | POA: Insufficient documentation

## 2016-02-28 DIAGNOSIS — M5416 Radiculopathy, lumbar region: Secondary | ICD-10-CM | POA: Diagnosis not present

## 2016-02-28 DIAGNOSIS — G8929 Other chronic pain: Secondary | ICD-10-CM

## 2016-02-28 DIAGNOSIS — M79604 Pain in right leg: Secondary | ICD-10-CM | POA: Diagnosis not present

## 2016-02-28 MED ORDER — OXYCODONE-ACETAMINOPHEN 10-325 MG PO TABS
1.0000 | ORAL_TABLET | Freq: Four times a day (QID) | ORAL | 0 refills | Status: DC | PRN
Start: 2016-02-28 — End: 2016-03-31

## 2016-02-28 MED ORDER — GABAPENTIN 300 MG PO CAPS: ORAL_CAPSULE | ORAL | 5 refills | Status: DC

## 2016-02-28 MED ORDER — MORPHINE SULFATE ER 30 MG PO TBCR: 30.0000 mg | EXTENDED_RELEASE_TABLET | Freq: Two times a day (BID) | ORAL | 0 refills | Status: DC

## 2016-02-28 MED ORDER — IBUPROFEN 800 MG PO TABS: 800.0000 mg | ORAL_TABLET | Freq: Three times a day (TID) | ORAL | 5 refills | Status: DC | PRN

## 2016-02-28 NOTE — Progress Notes (Signed)
Subjective:    Patient ID: Ferguson Rensing, male    DOB: [birth year], 40 y.o.   MRN: [id number]  HPI: Mr. KABEL EYLES is a 40 year old male who returns for follow up for chronic pain and medication refill. He states his pain is located in his lower back radiating into his left lower extremity laterally.He rates his pain 7. His current exercise regime is performing stretching exercises and walking short distances.   Discussed with Mr. Hertenstein Tapentadol, we checked his insurance company, they do not cover Nucynta, he verbalizes understanding.   Pain Inventory Average Pain 6 Pain Right Now 7 My pain is sharp, stabbing and aching  In the last 24 hours, has pain interfered with the following? General activity 8 Relation with others 8 Enjoyment of life 9 What TIME of day is your pain at its worst? morning, evening, night Sleep (in general) Fair  Pain is worse with: walking, bending, sitting, inactivity and standing Pain improves with: rest and medication Relief from Meds: 5  Mobility walk with assistance use a cane how many minutes can you walk? 5 ability to climb steps?  yes do you drive?  yes Do you have any goals in this area?  no  Function disabled: date disabled 2010 I need assistance with the following:  dressing, bathing, meal prep, household duties and shopping Do you have any goals in this area?  no  Neuro/Psych weakness trouble walking spasms depression  Prior Studies Any changes since last visit?  no  Physicians involved in your care Any changes since last visit?  no   Family History  Problem Relation Age of Onset  . Hypertension Father    Social History   Social History  . Marital status: Married    Spouse name: N/A  . Number of children: N/A  . Years of education: N/A   Social History Main Topics  . Smoking status: Never Smoker  . Smokeless tobacco: Never Used  . Alcohol use No  . Drug use: No  . Sexual activity: Not Asked   Other Topics  Concern  . None   Social History Narrative  . None   Past Surgical History:  Procedure Laterality Date  . BACK SURGERY     lower back surgery x 2   Past Medical History:  Diagnosis Date  . Arthritis    "some in my back"  . GERD (gastroesophageal reflux disease)   . Headache(784.0)    BP 114/76   Pulse 66   SpO2 95%   Opioid Risk Score:   Fall Risk Score:  `1  Depression screen PHQ 2/9  Depression screen [facility] 2/9 02/03/2016 01/30/2016 01/01/2016 11/05/2015 09/05/2015 08/05/2015 06/06/2015  Decreased Interest 0 0 0 0 0 1 0  Down, Depressed, Hopeless 0 0 0 0 0 1 0  PHQ - 2 Score 0 0 0 0 0 2 0  Altered sleeping - - - - - 2 -  Tired, decreased energy - - - - - 2 -  Change in appetite - - - - - 1 -  Feeling bad or failure about yourself  - - - - - 2 -  Trouble concentrating - - - - - 1 -  Moving slowly or fidgety/restless - - - - - 0 -  Suicidal thoughts - - - - - 0 -  PHQ-9 Score - - - - - 10 -  Difficult doing work/chores - - - - - Somewhat difficult -    Review  of Systems  HENT: Negative.   Eyes: Negative.   Respiratory: Negative.   Cardiovascular: Negative.   Gastrointestinal: Negative.   Endocrine: Negative.   Genitourinary: Negative.   Musculoskeletal: Positive for back pain and gait problem.  Skin: Negative.   Allergic/Immunologic: Negative.   Neurological: Positive for weakness.  Hematological: Negative.   Psychiatric/Behavioral: Positive for dysphoric mood.       Objective:   Physical Exam  Constitutional: He is oriented to person, place, and time. He appears well-developed and well-nourished.  HENT:  Head: Normocephalic and atraumatic.  Neck: Normal range of motion. Neck supple.  Cardiovascular: Normal rate and regular rhythm.   Pulmonary/Chest: Effort normal and breath sounds normal.  Musculoskeletal:  Normal Muscle Bulk and Muscle Testing Reveals: Upper Extremities: Full ROM and Muscle Strength 5/5 Lumbar Hypersensitivity Lower Extremities: Decreased  ROM and Muscle Strength 4/5 Bilateral Lower Extremities Flexion Produces Pain into Lumbar Arises from table slowly Antalgic Gait  Neurological: He is alert and oriented to person, place, and time.  Skin: Skin is warm and dry.  Psychiatric: He has a normal mood and affect.  Nursing note and vitals reviewed.         Assessment & Plan:  1. 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 He has lumbar degenerative disc disease. Continue Gabapentin :  Refilled: MS Contin 30 mg one tablet every 12 hours #60 and Oxycodone 10/325 mg one tablet every 6 hours as needed for pain #120.  We will continue the opioid monitoring program, this consists of regular clinic visits, examinations, urine drug screen, pill counts as well as use of [place] Controlled Substance reporting System. S/P Right-sided Lumbar Facet Radiofrequency Ablation under fluoroscopic guidance with IV sedation on August 1,2017by Dr. Consuela Mimes with relief noted. 2. Sacroiliac Dysfunction: Continue with current medication, heat and exercise regime  20 minutes of face to face patient care time was spent during this visit. All questions were encouraged and answered.  F/U in 1 month.

## 2016-02-28 NOTE — Patient Instructions (Signed)
Tapentadol extended-release tablets What is this medicine? TAPENTADOL (ta PEN ta dol) is a pain reliever. It is used to treat moderate to severe pain that lasts for more than a few days. It is also used to treat nerve pain caused by diabetes. This medicine may be used for other purposes; ask your health care provider or pharmacist if you have questions. What should I tell my health care provider before I take this medicine? They need to know if you have any of these conditions: -Addison's disease -gallbladder disease -head injury -history of a drug or alcohol abuse problem -if you often drink alcohol -kidney disease -liver disease -low blood pressure -lung or breathing disease, like asthma -mental illness -prostate disease -seizures -stomach or intestine problems -thyroid disease -an unusual or allergic reaction to tapentadol, other medicines, foods, dyes, or preservatives -pregnant or trying to get pregnant -breast-feeding How should I use this medicine? Take this medicine by mouth with a glass of water. Do not cut, crush, or chew this medicine. Do not take a tablet that is not whole. A broken or crushed tablet can be very dangerous. You may get too much medicine. Swallow only one tablet at a time. Do not wet, soak, or lick the tablet before you take it. You can take it with or without food. If it upsets your stomach, take it with food. Follow the directions on the prescription label. Take your medicine at regular intervals. Do not take it more often than directed. Do not stop taking except on your doctor's advice. A special MedGuide will be given to you by the pharmacist with each prescription and refill. Be sure to read this information carefully each time. Talk to your pediatrician regarding the use of this medicine in children. Special care may be needed. Overdosage: If you think you have taken too much of this medicine contact a poison control center or emergency room at once. NOTE:  This medicine is only for you. Do not share this medicine with others. What if I miss a dose? If you miss a dose, take it as soon as you can. If it is almost time for your next dose, take only that dose. Do not take double or extra doses. What may interact with this medicine? Do not take this medicine with any of the following medications: -alcohol or any product that contains alcohol -MAOIs like Carbex, Eldepryl, Marplan, Nardil, and Parnate This medicine may also interact with the following medications: -antihistamines for allergy, cough, and cold -atropine -buprenorphine -certain medicines for bladder problems like oxybutynin, tolterodine -certain medicines for depression, anxiety, or psychotic disturbances -certain medicines for migraine headache like almotriptan, eletriptan, frovatriptan, naratriptan, rizatriptan, sumatriptan, zolmitriptan -certain medicines for Parkinson's disease like benztropine, trihexyphenidyl -certain medicines for stomach problems like dicyclomine, hyoscyamine -certain medicines for travel sickness like scopolamine -ipratropium -medicines for seizures like carbamazepine, phenobarbital, phenytoin -medicines for sleep -muscle relaxants -narcotic medicines (opiates) for pain -phenothiazines like chlorpromazine, mesoridazine, prochlorperazine, thioridazine This list may not describe all possible interactions. Give your health care provider a list of all the medicines, herbs, non-prescription drugs, or dietary supplements you use. Also tell them if you smoke, drink alcohol, or use illegal drugs. Some items may interact with your medicine. What should I watch for while using this medicine? Tell your doctor or health care professional if your pain does not go away, if it gets worse, or if you have new or a different type of pain. You may develop tolerance to the medicine. Tolerance means that  you will need a higher dose of the medicine for pain relief. Tolerance is  normal and is expected if you take the medicine for a long time. Do not suddenly stop taking your medicine because you may develop a severe reaction. Your body becomes used to the medicine. This does NOT mean you are addicted. Addiction is a behavior related to getting and using a drug for a non-medical reason. If you have pain, you have a medical reason to take pain medicine. Your doctor will tell you how much medicine to take. If your doctor wants you to stop the medicine, the dose will be slowly lowered over time to avoid any side effects. You may get drowsy or dizzy. Do not drive, use machinery, or do anything that needs mental alertness until you know how this medicine affects you. Do not stand or sit up quickly, especially if you are an older patient. This reduces the risk of dizzy or fainting spells. Do not drink, eat, or take anything that may contain alcohol. Alcohol makes this medicine work incorrectly. You may have very dangerous side effects if you take the medicine with any alcohol. There are different types of narcotic medicines (opiates) for pain. If you take more than one type at the same time, you may have more side effects. Give your health care provider a list of all medicines you use. Your doctor will tell you how much medicine to take. Do not take more medicine than directed. Call emergency for help if you have problems breathing. This medicine will cause constipation. Try to have a bowel movement at least every 2 to 3 days. If you do not have a bowel movement for 3 days, call your doctor or health care professional. What side effects may I notice from receiving this medicine? Side effects that you should report to your doctor or health care professional as soon as possible: -allergic reactions like skin rash, itching or hives, swelling of the face, lips, or tongue -anxious -breathing problems -changes in vision -confusion -fast, irregular heartbeat -feeling faint or lightheaded,  falls -hallucinations -muscle spasms -seizures -trouble passing urine or change in the amount of urine -unusually weak or tired Side effects that usually do not require medical attention (Report these to your doctor or health care professional if they continue or are bothersome.): -changes in emotions or moods -constipation -headache -nausea, vomiting -sweating -upset stomach This list may not describe all possible side effects. Call your doctor for medical advice about side effects. You may report side effects to FDA at 1-800-FDA-1088. Where should I keep my medicine? Keep out of the reach of children. This medicine can be abused. Keep your medicine in a safe place to protect it from theft. Do not share this medicine with anyone. Selling or giving away this medicine is dangerous and against the law. Store at room temperature between 15 and 30 degrees C (59 and 86 degrees F). Protect from moisture. This medicine may cause accidental overdose and death if it is taken by other adults, children, or pets. Flush any unused medicine down the toilet to reduce the chance of harm. Do not use the medicine after the expiration date. NOTE: This sheet is a summary. It may not cover all possible information. If you have questions about this medicine, talk to your doctor, pharmacist, or health care provider.    2016, [employer] Standard. (2012-12-07 16:38:44)

## 2016-03-31 ENCOUNTER — Encounter (INDEPENDENT_AMBULATORY_CARE_PROVIDER_SITE_OTHER): Payer: Self-pay

## 2016-03-31 ENCOUNTER — Encounter: Payer: Self-pay | Admitting: Registered Nurse

## 2016-03-31 ENCOUNTER — Encounter: Payer: PPO | Attending: Physical Medicine & Rehabilitation | Admitting: Registered Nurse

## 2016-03-31 VITALS — BP 116/75 | HR 73 | Resp 14

## 2016-03-31 DIAGNOSIS — Z79899 Other long term (current) drug therapy: Secondary | ICD-10-CM

## 2016-03-31 DIAGNOSIS — M79604 Pain in right leg: Secondary | ICD-10-CM | POA: Insufficient documentation

## 2016-03-31 DIAGNOSIS — Z981 Arthrodesis status: Secondary | ICD-10-CM | POA: Diagnosis not present

## 2016-03-31 DIAGNOSIS — M79605 Pain in left leg: Secondary | ICD-10-CM | POA: Diagnosis not present

## 2016-03-31 DIAGNOSIS — G894 Chronic pain syndrome: Secondary | ICD-10-CM

## 2016-03-31 DIAGNOSIS — M5416 Radiculopathy, lumbar region: Secondary | ICD-10-CM | POA: Diagnosis not present

## 2016-03-31 DIAGNOSIS — G8928 Other chronic postprocedural pain: Secondary | ICD-10-CM | POA: Insufficient documentation

## 2016-03-31 DIAGNOSIS — G8929 Other chronic pain: Secondary | ICD-10-CM

## 2016-03-31 DIAGNOSIS — Z5181 Encounter for therapeutic drug level monitoring: Secondary | ICD-10-CM | POA: Diagnosis not present

## 2016-03-31 DIAGNOSIS — M545 Low back pain: Secondary | ICD-10-CM | POA: Insufficient documentation

## 2016-03-31 DIAGNOSIS — M961 Postlaminectomy syndrome, not elsewhere classified: Secondary | ICD-10-CM | POA: Diagnosis not present

## 2016-03-31 DIAGNOSIS — M62830 Muscle spasm of back: Secondary | ICD-10-CM

## 2016-03-31 MED ORDER — OXYCODONE-ACETAMINOPHEN 10-325 MG PO TABS: 1.0000 | ORAL_TABLET | Freq: Four times a day (QID) | ORAL | 0 refills | Status: DC | PRN

## 2016-03-31 MED ORDER — MORPHINE SULFATE ER 30 MG PO TBCR: 30.0000 mg | EXTENDED_RELEASE_TABLET | Freq: Two times a day (BID) | ORAL | 0 refills | Status: DC

## 2016-03-31 NOTE — Progress Notes (Signed)
Subjective:    Patient ID: Samuel [name], male    DOB: 01-May-1976, 40 y.o.   MRN: [id number]  HPI: [name] is a 40 year old male who returns for follow up for chronic pain and medication refill. He states his pain is located in his lower back radiating into his bilateral lower extremities laterally L>R.He rates his pain 6. His current exercise regime is performing stretching exercises and walking short distances.   Samuel [name] is scheduled to have Left MBB Radiofrequency with Dr. Dossie Arbour on 04/01/2016.   Pain Inventory Average Pain 6 Pain Right Now 6 My pain is constant, sharp, dull, stabbing and aching  In the last 24 hours, has pain interfered with the following? General activity 9 Relation with others 9 Enjoyment of life 9 What TIME of day is your pain at its worst? morning, evening, night Sleep (in general) Fair  Pain is worse with: walking, bending, sitting, inactivity and standing Pain improves with: rest and medication Relief from Meds: 5  Mobility walk without assistance walk with assistance use a cane how many minutes can you walk? 5 ability to climb steps?  yes do you drive?  yes Do you have any goals in this area?  no  Function disabled: date disabled 2010 I need assistance with the following:  dressing, bathing, meal prep, household duties and shopping Do you have any goals in this area?  no  Neuro/Psych weakness trouble walking spasms depression  Prior Studies Any changes since last visit?  no  Physicians involved in your care Any changes since last visit?  no   Family History  Problem Relation Age of Onset  . Hypertension Father    Social History   Social History  . Marital status: Married    Spouse name: N/A  . Number of children: N/A  . Years of education: N/A   Social History Main Topics  . Smoking status: Never Smoker  . Smokeless tobacco: Never Used  . Alcohol use No  . Drug use: No  . Sexual activity: Not Asked    Other Topics Concern  . None   Social History Narrative  . None   Past Surgical History:  Procedure Laterality Date  . BACK SURGERY     lower back surgery x 2   Past Medical History:  Diagnosis Date  . Arthritis    "some in my back"  . GERD (gastroesophageal reflux disease)   . Headache(784.0)    BP 116/75 (BP Location: Right Arm, Patient Position: Sitting, Cuff Size: Large)   Pulse 73   Resp 14   SpO2 96%   Opioid Risk Score:   Fall Risk Score:  `1  Depression screen PHQ 2/9  Depression screen [facility] 2/9 02/03/2016 01/30/2016 01/01/2016 11/05/2015 09/05/2015 08/05/2015 06/06/2015  Decreased Interest 0 0 0 0 0 1 0  Down, Depressed, Hopeless 0 0 0 0 0 1 0  PHQ - 2 Score 0 0 0 0 0 2 0  Altered sleeping - - - - - 2 -  Tired, decreased energy - - - - - 2 -  Change in appetite - - - - - 1 -  Feeling bad or failure about yourself  - - - - - 2 -  Trouble concentrating - - - - - 1 -  Moving slowly or fidgety/restless - - - - - 0 -  Suicidal thoughts - - - - - 0 -  PHQ-9 Score - - - - - 10 -  Difficult doing work/chores - - - - - Somewhat difficult -    Review of Systems  HENT: Negative.   Respiratory: Negative.   Cardiovascular: Negative.   Gastrointestinal: Negative.   Endocrine: Negative.   Musculoskeletal: Positive for arthralgias, back pain, gait problem and myalgias.       Spasms   Allergic/Immunologic: Negative.   Neurological: Positive for weakness.  Hematological: Negative.   Psychiatric/Behavioral: Negative.   All other systems reviewed and are negative.      Objective:   Physical Exam  Constitutional: He is oriented to person, place, and time. He appears well-developed and well-nourished.  HENT:  Head: Normocephalic and atraumatic.  Neck: Normal range of motion. Neck supple.  Cardiovascular: Normal rate and regular rhythm.   Pulmonary/Chest: Effort normal and breath sounds normal.  Musculoskeletal:  Normal Muscle Bulk and Muscle Testing Reveals: Upper  Extremities: Full ROM and Muscle Strength 5/5 Lumbar Paraspinal Tenderness: L-3-L-5 Lower Extremities: Full ROM and Muscle Strength 5/5 Bilateral Lower Extremities Flexion Produces Pain into Lumbar Arises from Table slowly Antalgic Gait  Neurological: He is alert and oriented to person, place, and time.  Skin: Skin is warm and dry.  Psychiatric: He has a normal mood and affect.  Nursing note and vitals reviewed.         Assessment & Plan:  1. 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 He has lumbar degenerative disc disease. Continue Gabapentin :  Refilled: MS Contin 30 mg one tablet every 12 hours #60 and Oxycodone 10/325 mg one tablet every 6 hours as needed for pain #120.  We will continue the opioid monitoring program, this consists of regular clinic visits, examinations, urine drug screen, pill counts as well as use of [place] Controlled Substance reporting System. Scheduled for Left-sided Lumbar Facet Radiofrequency Ablation under fluoroscopic guidance with IV sedation on November 8,2017 with Dr. Consuela Mimes  2. Sacroiliac Dysfunction: Continue with current medication, heat and exercise regime 3. Muscle Spasm: Continue to monitor  20 minutes of face to face patient care time was spent during this visit. All questions were encouraged and answered.  F/U in 1 month.

## 2016-04-01 ENCOUNTER — Ambulatory Visit
Admission: RE | Admit: 2016-04-01 | Discharge: 2016-04-01 | Disposition: A | Discharge status: Home / Self Care | Payer: PPO | Source: Ambulatory Visit | Attending: Pain Medicine | Admitting: Pain Medicine

## 2016-04-01 ENCOUNTER — Encounter: Payer: Self-pay | Admitting: Pain Medicine

## 2016-04-01 ENCOUNTER — Ambulatory Visit (HOSPITAL_BASED_OUTPATIENT_CLINIC_OR_DEPARTMENT_OTHER): Payer: PPO | Admitting: Pain Medicine

## 2016-04-01 VITALS — BP 127/71 | HR 70 | Temp 98.2°F | Resp 16 | Ht 73.0 in | Wt 320.0 lb

## 2016-04-01 DIAGNOSIS — F419 Anxiety disorder, unspecified: Secondary | ICD-10-CM | POA: Diagnosis not present

## 2016-04-01 DIAGNOSIS — G8918 Other acute postprocedural pain: Secondary | ICD-10-CM

## 2016-04-01 DIAGNOSIS — M5442 Lumbago with sciatica, left side: Secondary | ICD-10-CM | POA: Diagnosis not present

## 2016-04-01 DIAGNOSIS — G8929 Other chronic pain: Secondary | ICD-10-CM | POA: Diagnosis not present

## 2016-04-01 DIAGNOSIS — F418 Other specified anxiety disorders: Secondary | ICD-10-CM | POA: Insufficient documentation

## 2016-04-01 DIAGNOSIS — M5416 Radiculopathy, lumbar region: Secondary | ICD-10-CM

## 2016-04-01 DIAGNOSIS — M47816 Spondylosis without myelopathy or radiculopathy, lumbar region: Secondary | ICD-10-CM

## 2016-04-01 DIAGNOSIS — M1288 Other specific arthropathies, not elsewhere classified, other specified site: Secondary | ICD-10-CM

## 2016-04-01 DIAGNOSIS — G894 Chronic pain syndrome: Secondary | ICD-10-CM | POA: Insufficient documentation

## 2016-04-01 MED ORDER — ROPIVACAINE HCL 2 MG/ML IJ SOLN
INTRAMUSCULAR | Status: AC
  Administered 2016-04-01: 12:00:00
  Filled 2016-04-01: qty 10

## 2016-04-01 MED ORDER — LIDOCAINE HCL (PF) 1 % IJ SOLN
INTRAMUSCULAR | Status: AC
  Administered 2016-04-01: 12:00:00
  Filled 2016-04-01: qty 5

## 2016-04-01 MED ORDER — TRIAMCINOLONE ACETONIDE 40 MG/ML IJ SUSP: 40.0000 mg | Freq: Once | INTRAMUSCULAR | Status: DC

## 2016-04-01 MED ORDER — TRIAMCINOLONE ACETONIDE 40 MG/ML IJ SUSP
INTRAMUSCULAR | Status: AC
  Administered 2016-04-01: 12:00:00
  Filled 2016-04-01: qty 1

## 2016-04-01 MED ORDER — DIAZEPAM 5 MG PO TABS: 5.0000 mg | ORAL_TABLET | Freq: Every day | ORAL | 0 refills | Status: DC

## 2016-04-01 MED ORDER — LIDOCAINE HCL (PF) 1 % IJ SOLN: 10.0000 mL | Freq: Once | INTRAMUSCULAR | Status: DC

## 2016-04-01 MED ORDER — MIDAZOLAM HCL 5 MG/5ML IJ SOLN: 1.0000 mg | INTRAMUSCULAR | Status: DC | PRN

## 2016-04-01 MED ORDER — FENTANYL CITRATE (PF) 100 MCG/2ML IJ SOLN: 25.0000 ug | INTRAMUSCULAR | Status: DC | PRN

## 2016-04-01 MED ORDER — LACTATED RINGERS IV SOLN: 1000.0000 mL | Freq: Once | INTRAVENOUS | Status: DC

## 2016-04-01 MED ORDER — ROPIVACAINE HCL 2 MG/ML IJ SOLN: 9.0000 mL | Freq: Once | INTRAMUSCULAR | Status: DC

## 2016-04-01 MED ORDER — MIDAZOLAM HCL 5 MG/5ML IJ SOLN
INTRAMUSCULAR | Status: AC
  Administered 2016-04-01: 5 mg
  Filled 2016-04-01: qty 5

## 2016-04-01 MED ORDER — FENTANYL CITRATE (PF) 100 MCG/2ML IJ SOLN
INTRAMUSCULAR | Status: AC
  Administered 2016-04-01: 100 ug
  Filled 2016-04-01: qty 2

## 2016-04-01 NOTE — Patient Instructions (Signed)
You were given a prescription for Valium today for your MRI. Pain Management Discharge Instructions  General Discharge Instructions :  If you need to reach your doctor call: Monday-Friday 8:00 am - 4:00 pm at [phone] or toll free [phone].  After clinic hours [phone] to have operator reach doctor.  Bring all of your medication bottles to all your appointments in the pain clinic.  To cancel or reschedule your appointment with Pain Management please remember to call 24 hours in advance to avoid a fee.  Refer to the educational materials which you have been given on: General Risks, I had my Procedure. Discharge Instructions, Post Sedation.  Post Procedure Instructions:  The drugs you were given will stay in your system until tomorrow, so for the next 24 hours you should not drive, make any legal decisions or drink any alcoholic beverages.  You may eat anything you prefer, but it is better to start with liquids then soups and crackers, and gradually work up to solid foods.  Please notify your doctor immediately if you have any unusual bleeding, trouble breathing or pain that is not related to your normal pain.  Depending on the type of procedure that was done, some parts of your body may feel week and/or numb.  This usually clears up by tonight or the next day.  Walk with the use of an assistive device or accompanied by an adult for the 24 hours.  You may use ice on the affected area for the first 24 hours.  Put ice in a Ziploc bag and cover with a towel and place against area 15 minutes on 15 minutes off.  You may switch to heat after 24 hours.

## 2016-04-01 NOTE — Progress Notes (Signed)
Safety precautions to be maintained throughout the outpatient stay will include: orient to surroundings, keep bed in low position, maintain call bell within reach at all times, provide assistance with transfer out of bed and ambulation.  

## 2016-04-01 NOTE — Progress Notes (Signed)
Patient's Name: Samuel [name]  MRN: [id number]  Referring Provider: Milinda Pointer, MD  DOB: 1976/02/15  PCP: Pcp Not In System  DOS: 04/01/2016  Note by: Kathlen Brunswick. Dossie Arbour, MD  Service setting: Ambulatory outpatient  Location: [facility] (AMB) Pain Management Facility  Visit type: Procedure  Specialty: Interventional Pain Management  Patient type: Established   Primary Reason for Visit: Interventional Pain Management Treatment. CC: Back Pain (lower)  Procedure:  Anesthesia, Analgesia, Anxiolysis:  Type: Therapeutic Medial Branch Facet Radiofrequency Ablation (right side done on 12/24/2015) Region: Lumbar Level: L2, L3, L4, L5, & S1 Medial Branch Level(s) Laterality: Left-Sided  Type: Local Anesthesia with Moderate (Conscious) Sedation Local Anesthetic: Lidocaine 1% Route: Intravenous (IV) IV Access: Secured Sedation: Meaningful verbal contact was maintained at all times during the procedure  Indication(s): Analgesia and Anxiety  Indications: 1. Lumbar facet syndrome (Location of Primary Source of Pain) (Bilateral) (R>L)   2. Lumbar spondylosis   3. Chronic low back pain (Location of Primary Source of Pain) (Bilateral) (R>L)   4. Chronic lumbar radicular pain (Location of Secondary source of pain) (Bilateral) (R>L) (L5 dermatome)   5. Acute postoperative pain   6. Situational anxiety    Samuel [name] has either failed to respond, was unable to tolerate, or simply did not get enough benefit from other more conservative therapies including, but not limited to: 1. Over-the-counter medications 2. Anti-inflammatory medications 3. Muscle relaxants 4. Membrane stabilizers 5. Opioids 6. Physical therapy 7. Modalities (Heat, ice, etc.) 8. Invasive techniques such as nerve blocks. Samuel [name] has attained more than 50% relief of the pain from a series of diagnostic injections conducted in separate occasions.  Pain Score: Pre-procedure: 5 /10 Post-procedure: 2 /10  Pre-Procedure Assessment:  Mr.  [name] is a 40 y.o. (year old), male patient, seen today for interventional treatment. He  has a past surgical history that includes Back surgery.. His primarily concern today is the Back Pain (lower) The primary encounter diagnosis was Lumbar facet syndrome (Location of Primary Source of Pain) (Bilateral) (R>L). Diagnoses of Lumbar spondylosis, Chronic low back pain (Location of Primary Source of Pain) (Bilateral) (R>L), Chronic lumbar radicular pain (Location of Secondary source of pain) (Bilateral) (R>L) (L5 dermatome), Acute postoperative pain, and Situational anxiety were also pertinent to this visit.  Pain Type: Chronic pain Pain Location: Back Pain Orientation: Lower, Left, Right Pain Descriptors / Indicators: Aching, Constant, Stabbing  Date of Last Visit: 02/03/16 Service Provided on Last Visit: Med Refill  Coagulation Parameters Lab Results  Component Value Date   PLT 138 (L) 06/13/2013   Verification of the correct person, correct site (including marking of site), and correct procedure were performed and confirmed by the patient.  Consent: Before the procedure and under the influence of no sedative(s), amnesic(s), or anxiolytics, the patient was informed of the treatment options, risks and possible complications. To fulfill our ethical and legal obligations, as recommended by the American Medical Association's Code of Ethics, I have informed the patient of my clinical impression; the nature and purpose of the treatment or procedure; the risks, benefits, and possible complications of the intervention; the alternatives, including doing nothing; the risk(s) and benefit(s) of the alternative treatment(s) or procedure(s); and the risk(s) and benefit(s) of doing nothing. The patient was provided information about the general risks and possible complications associated with the procedure. These may include, but are not limited to: failure to achieve desired goals, infection, bleeding, organ or  nerve damage, allergic reactions, paralysis, and death. In addition, the patient was  informed of those risks and complications associated to Spine-related procedures, such as failure to decrease pain; infection (i.e.: Meningitis, epidural or intraspinal abscess); bleeding (i.e.: epidural hematoma, subarachnoid hemorrhage, or any other type of intraspinal or peri-dural bleeding); organ or nerve damage (i.e.: Any type of peripheral nerve, nerve root, or spinal cord injury) with subsequent damage to sensory, motor, and/or autonomic systems, resulting in permanent pain, numbness, and/or weakness of one or several areas of the body; allergic reactions; (i.e.: anaphylactic reaction); and/or death. Furthermore, the patient was informed of those risks and complications associated with the medications. These include, but are not limited to: allergic reactions (i.e.: anaphylactic or anaphylactoid reaction(s)); adrenal axis suppression; blood sugar elevation that in diabetics may result in ketoacidosis or comma; water retention that in patients with history of congestive heart failure may result in shortness of breath, pulmonary edema, and decompensation with resultant heart failure; weight gain; swelling or edema; medication-induced neural toxicity; particulate matter embolism and blood vessel occlusion with resultant organ, and/or nervous system infarction; and/or aseptic necrosis of one or more joints. Finally, the patient was informed that Medicine is not an exact science; therefore, there is also the possibility of unforeseen or unpredictable risks and/or possible complications that may result in a catastrophic outcome. The patient indicated having understood very clearly. We have given the patient no guarantees and we have made no promises. Enough time was given to the patient to ask questions, all of which were answered to the patient's satisfaction. Samuel [name] has indicated that he wanted to continue with the  procedure.  Consent Attestation: I, the ordering provider, attest that I have discussed with the patient the benefits, risks, side-effects, alternatives, likelihood of achieving goals, and potential problems during recovery for the procedure that I have provided informed consent.  Pre-Procedure Preparation:  Safety Precautions: Allergies reviewed. The patient was asked about blood thinners, or active infections, both of which were denied. The patient was asked to confirm the procedure and laterality, before marking the site, and again before commencing the procedure. Appropriate site, procedure, and patient were confirmed by following the Joint Commission's Universal Protocol (UP.01.01.01), in the form of a "Time Out". The patient was asked to participate by confirming the accuracy of the "Time Out" information. Patient was assessed for positional comfort and pressure points before starting the procedure. Allergies: He has No Known Allergies. Allergy Precautions: None required Infection Control Precautions: Sterile technique used. Standard Universal Precautions were taken as recommended by the Department of [facility] for Disease Control and Prevention (CDC). Standard pre-surgical skin prep was conducted. Respiratory hygiene and cough etiquette was practiced. Hand hygiene observed. Safe injection practices and needle disposal techniques followed. SDV (single dose vial) medications used. Medications properly checked for expiration dates and contaminants. Personal protective equipment (PPE) used as per protocol. Monitoring:  As per clinic protocol. Vitals:   04/01/16 1210 04/01/16 1220 04/01/16 1230 04/01/16 1239  BP: 125/72 (!) 141/62 132/60 127/71  Pulse: 76 69 74 70  Resp: 19 16 16 16   Temp:      TempSrc:      SpO2: 95% 96% 96% 96%  Weight:      Height:      Calculated BMI: Body mass index is 42.22 kg/m. Time-out: "Time-out" completed before starting procedure, as per  protocol.  Description of Procedure Process:   Time-out: "Time-out" completed before starting procedure, as per protocol. Position: Prone Target Area: For Lumbar Facet blocks, the target is the groove formed by the junction of  the transverse process and superior articular process. For the L5 dorsal ramus, the target is the notch between superior articular process and sacral ala. For the S1 dorsal ramus, the target is the superior and lateral edge of the posterior S1 Sacral foramen. Approach: Paraspinal approach. Area Prepped: Entire Posterior Lumbosacral Region Prepping solution: ChloraPrep (2% chlorhexidine gluconate and 70% isopropyl alcohol) Safety Precautions: Aspiration looking for blood return was conducted prior to all injections. At no point did we inject any substances, as a needle was being advanced. No attempts were made at seeking any paresthesias. Safe injection practices and needle disposal techniques used. Medications properly checked for expiration dates. SDV (single dose vial) medications used. Description of the Procedure: Protocol guidelines were followed. The patient was placed in position over the fluoroscopy table. The target area was identified and the area prepped in the usual manner. Skin desensitized using vapocoolant spray. Skin & deeper tissues infiltrated with local anesthetic. Appropriate amount of time allowed to pass for local anesthetics to take effect. Radiofrequency needles were introduced to the area of the medial branch at the junction of the superior articular process and transverse process using fluoroscopy. Using the [employer], sensory stimulation using 50 Hz was used to locate & identify the nerve, making sure that the needle was positioned such that there was no sensory stimulation below 0.3 V or above 0.7 V. Stimulation using 2 Hz was used to evaluate the motor component. Care was taken not to lesion any nerves that demonstrated motor  stimulation of the lower extremities at an output of less than 2.5 times that of the sensory threshold, or a maximum of 2.0 V. Once satisfactory placement of the needles was achieved, the above solution was slowly injected after negative aspiration. After waiting for at least 2 minutes, the ablation was performed at 80 degrees C for 60 seconds.The needles were then removed and the area cleansed, making sure to leave some of the prepping solution back to take advantage of its long term bactericidal properties. EBL: None Materials & Medications Used:  Needle(s) Used: Teflon-Coated Radiofrequency Needles  Imaging Guidance (Spinal):  Type of Imaging Technique: Fluoroscopy Guidance (Spinal) Indication(s): Assistance in needle guidance and placement for procedures requiring needle placement in or near specific anatomical locations not easily accessible without such assistance. Exposure Time: Please see nurses notes. Contrast: None used. Fluoroscopic Guidance: I was personally present during the use of fluoroscopy. "Tunnel Vision Technique" used to obtain the best possible view of the target area. Parallax error corrected before commencing the procedure. "Direction-depth-direction" technique used to introduce the needle under continuous pulsed fluoroscopy. Once target was reached, antero-posterior, oblique, and lateral fluoroscopic projection used confirm needle placement in all planes. Images permanently stored in EMR. Interpretation: No contrast injected. I personally interpreted the imaging intraoperatively. Adequate needle placement confirmed in multiple planes. Permanent images saved into the patient's record.  Antibiotic Prophylaxis:  Indication(s): No indications identified. Type:  Antibiotics Given (last 72 hours)    None      Post-operative Assessment:  Complications: No immediate post-treatment complications observed by team, or reported by patient. Disposition: The patient tolerated the  entire procedure well. A repeat set of vitals were taken after the procedure and the patient was kept under observation following institutional policy, for this type of procedure. Post-procedural neurological assessment was performed, showing return to baseline, prior to discharge. The patient was provided with post-procedure discharge instructions, including a section on how to identify potential problems. Should any problems arise concerning this  procedure, the patient was given instructions to immediately contact us, at any time, without hesitation. In any case, we plan to contact the patient by telephone for a follow-up status report regarding this interventional procedure. Comments:  No additional relevant information.  Plan of Care  Discharge to: Discharge home  Medications ordered for procedure: Meds ordered this encounter  Medications  . fentaNYL (SUBLIMAZE) injection 25-50 mcg    Make sure Narcan is available in the pyxis when using this medication. In the event of respiratory depression (RR< 8/min): Titrate NARCAN (naloxone) in increments of 0.1 to 0.2 mg IV at 2-3 minute intervals, until desired degree of reversal.  . lactated ringers infusion 1,000 mL  . midazolam (VERSED) 5 MG/5ML injection 1-2 mg    Make sure Flumazenil is available in the pyxis when using this medication. If oversedation occurs, administer 0.2 mg IV over 15 sec. If after 45 sec no response, administer 0.2 mg again over 1 min; may repeat at 1 min intervals; not to exceed 4 doses (1 mg)  . triamcinolone acetonide (KENALOG-40) injection 40 mg  . lidocaine (PF) (XYLOCAINE) 1 % injection 10 mL  . ropivacaine (PF) 2 mg/ml (0.2%) (NAROPIN) epidural 9 mL  . lidocaine (PF) (XYLOCAINE) 1 % injection    TICE, KORI: cabinet override  . ropivacaine (PF) 2 mg/ml (0.2%) (NAROPIN) 2 MG/ML epidural    TICE, KORI: cabinet override  . fentaNYL (SUBLIMAZE) 100 MCG/2ML injection    TICE, KORI: cabinet override  . midazolam (VERSED)  5 MG/5ML injection    TICE, KORI: cabinet override  . triamcinolone acetonide (KENALOG-40) 40 MG/ML injection    TICE, KORI: cabinet override  . DISCONTD: diazepam (VALIUM) 5 MG tablet    Sig: Take 1-2 tablets (5-10 mg total) by mouth daily. Take one tablet 45 minutes prior to MRI and one, just before going in.    Dispense:  2 tablet    Refill:  0    To be used only for MRI. Make sure to have a driver.   Medications administered: (For more details, see medical record) We administered lidocaine (PF), ropivacaine (PF) 2 mg/ml (0.2%), fentaNYL, midazolam, and triamcinolone acetonide. Lab-work, Procedure(s), & Referral(s) Ordered: Orders Placed This Encounter  Procedures  . DG C-Arm 1-60 Min-No Report  . MR LUMBAR SPINE W WO CONTRAST   Imaging Ordered: No results found for this or any previous visit. New Prescriptions   No medications on file   Physician-requested Follow-up:  Return in about 6 weeks (around 05/13/2016) for Post-Procedure evaluation.  Future Appointments Date Time Provider [place]  04/29/2016 9:00 AM Bayard Hugger, NP [facility] CPR  05/05/2016 10:45 AM Milinda Pointer, MD [facility] None   Primary Care Physician: Pcp Not In System Location: [facility] Note by: Kathlen Brunswick. Dossie Arbour, M.D, DABA, DABAPM, DABPM, DABIPP, FIPP  Disclaimer:  Medicine is not an exact science. The only guarantee in medicine is that nothing is guaranteed. It is important to note that the decision to proceed with this intervention was based on the information collected from the patient. The Data and conclusions were drawn from the patient's questionnaire, the interview, and the physical examination. Because the information was provided in large part by the patient, it cannot be guaranteed that it has not been purposely or unconsciously manipulated. Every effort has been made to obtain as much relevant data as possible for this evaluation. It is important to  note that the conclusions that lead to this procedure are derived in  large part from the available data. Always take into account that the treatment will also be dependent on availability of resources and existing treatment guidelines, considered by other Pain Management Practitioners as being common knowledge and practice, at the time of the intervention. For Medico-Legal purposes, it is also important to point out that variation in procedural techniques and pharmacological choices are the acceptable norm. The indications, contraindications, technique, and results of the above procedure should only be interpreted and judged by a Board-Certified Interventional Pain Specialist with extensive familiarity and expertise in the same exact procedure and technique. Attempts at providing opinions without similar or greater experience and expertise than that of the treating physician will be considered as inappropriate and unethical, and shall result in a formal complaint to the state medical board and applicable specialty societies.  Instructions provided at this appointment: Patient Instructions  You were given a prescription for Valium today for your MRI. Pain Management Discharge Instructions  General Discharge Instructions :  If you need to reach your doctor call: Monday-Friday 8:00 am - 4:00 pm at [phone] or toll free [phone].  After clinic hours [phone] to have operator reach doctor.  Bring all of your medication bottles to all your appointments in the pain clinic.  To cancel or reschedule your appointment with Pain Management please remember to call 24 hours in advance to avoid a fee.  Refer to the educational materials which you have been given on: General Risks, I had my Procedure. Discharge Instructions, Post Sedation.  Post Procedure Instructions:  The drugs you were given will stay in your system until tomorrow, so for the next 24 hours you should not drive, make any legal  decisions or drink any alcoholic beverages.  You may eat anything you prefer, but it is better to start with liquids then soups and crackers, and gradually work up to solid foods.  Please notify your doctor immediately if you have any unusual bleeding, trouble breathing or pain that is not related to your normal pain.  Depending on the type of procedure that was done, some parts of your body may feel week and/or numb.  This usually clears up by tonight or the next day.  Walk with the use of an assistive device or accompanied by an adult for the 24 hours.  You may use ice on the affected area for the first 24 hours.  Put ice in a Ziploc bag and cover with a towel and place against area 15 minutes on 15 minutes off.  You may switch to heat after 24 hours.

## 2016-04-02 ENCOUNTER — Telehealth: Payer: Self-pay

## 2016-04-02 NOTE — Telephone Encounter (Signed)
Post procedure phone call.  Left message.  

## 2016-04-13 ENCOUNTER — Telehealth: Payer: Self-pay | Admitting: *Deleted

## 2016-04-29 ENCOUNTER — Encounter: Payer: Self-pay | Admitting: Registered Nurse

## 2016-04-29 ENCOUNTER — Encounter: Payer: PPO | Attending: Physical Medicine & Rehabilitation | Admitting: Registered Nurse

## 2016-04-29 VITALS — BP 123/84 | HR 62

## 2016-04-29 DIAGNOSIS — M545 Low back pain: Secondary | ICD-10-CM | POA: Diagnosis not present

## 2016-04-29 DIAGNOSIS — M961 Postlaminectomy syndrome, not elsewhere classified: Secondary | ICD-10-CM | POA: Insufficient documentation

## 2016-04-29 DIAGNOSIS — G8928 Other chronic postprocedural pain: Secondary | ICD-10-CM | POA: Diagnosis not present

## 2016-04-29 DIAGNOSIS — Z79899 Other long term (current) drug therapy: Secondary | ICD-10-CM

## 2016-04-29 DIAGNOSIS — Z5181 Encounter for therapeutic drug level monitoring: Secondary | ICD-10-CM

## 2016-04-29 DIAGNOSIS — Z981 Arthrodesis status: Secondary | ICD-10-CM | POA: Diagnosis not present

## 2016-04-29 DIAGNOSIS — M5416 Radiculopathy, lumbar region: Secondary | ICD-10-CM | POA: Diagnosis not present

## 2016-04-29 DIAGNOSIS — G894 Chronic pain syndrome: Secondary | ICD-10-CM | POA: Diagnosis not present

## 2016-04-29 DIAGNOSIS — M79605 Pain in left leg: Secondary | ICD-10-CM | POA: Insufficient documentation

## 2016-04-29 DIAGNOSIS — M79604 Pain in right leg: Secondary | ICD-10-CM | POA: Diagnosis not present

## 2016-04-29 DIAGNOSIS — G8929 Other chronic pain: Secondary | ICD-10-CM

## 2016-04-29 MED ORDER — OXYCODONE-ACETAMINOPHEN 10-325 MG PO TABS: 1.0000 | ORAL_TABLET | Freq: Four times a day (QID) | ORAL | 0 refills | Status: DC | PRN

## 2016-04-29 MED ORDER — MORPHINE SULFATE ER 30 MG PO TBCR: 30.0000 mg | EXTENDED_RELEASE_TABLET | Freq: Two times a day (BID) | ORAL | 0 refills | Status: DC

## 2016-04-29 NOTE — Progress Notes (Signed)
Subjective:    Patient ID: Samuel [name], male    DOB: December 09, 1975, 40 y.o.   MRN: [id number]  HPI:  Samuel [name] is a 40year old male who returns for follow up for chronic pain and medication refill. He states his pain is located in his mid-lower back.He rates his pain 6. His current exercise regime is performing stretching exercises and walking short distances.   Samuel [name] is had Left MBB Radiofrequency with Dr. Dossie Arbour on 04/01/2016, with relief noted.   Pain Inventory Average Pain 5 Pain Right Now 6 My pain is constant, sharp, burning, dull and stabbing  In the last 24 hours, has pain interfered with the following? General activity 8 Relation with others 8 Enjoyment of life 9 What TIME of day is your pain at its worst? all Sleep (in general) Fair  Pain is worse with: walking, bending, sitting and standing Pain improves with: rest and medication Relief from Meds: 5  Mobility use a cane how many minutes can you walk? 5 ability to climb steps?  yes do you drive?  yes Do you have any goals in this area?  no  Function disabled: date disabled 2010 I need assistance with the following:  dressing, bathing, meal prep, household duties and shopping Do you have any goals in this area?  no  Neuro/Psych weakness spasms depression  Prior Studies Any changes since last visit?  no  Physicians involved in your care Any changes since last visit?  no   Family History  Problem Relation Age of Onset  . Hypertension Father    Social History   Social History  . Marital status: Married    Spouse name: N/A  . Number of children: N/A  . Years of education: N/A   Social History Main Topics  . Smoking status: Never Smoker  . Smokeless tobacco: Never Used  . Alcohol use No  . Drug use: No  . Sexual activity: Not on file   Other Topics Concern  . Not on file   Social History Narrative  . No narrative on file   Past Surgical History:  Procedure Laterality Date  .  BACK SURGERY     lower back surgery x 2   Past Medical History:  Diagnosis Date  . Arthritis    "some in my back"  . GERD (gastroesophageal reflux disease)   . Headache(784.0)    There were no vitals taken for this visit.  Opioid Risk Score:   Fall Risk Score:  `1  Depression screen PHQ 2/9  Depression screen [facility] 2/9 04/01/2016 02/03/2016 01/30/2016 01/01/2016 11/05/2015 09/05/2015 08/05/2015  Decreased Interest - 0 0 0 0 0 1  Down, Depressed, Hopeless 0 0 0 0 0 0 1  PHQ - 2 Score 0 0 0 0 0 0 2  Altered sleeping - - - - - - 2  Tired, decreased energy - - - - - - 2  Change in appetite - - - - - - 1  Feeling bad or failure about yourself  - - - - - - 2  Trouble concentrating - - - - - - 1  Moving slowly or fidgety/restless - - - - - - 0  Suicidal thoughts - - - - - - 0  PHQ-9 Score - - - - - - 10  Difficult doing work/chores - - - - - - Somewhat difficult   Review of Systems  HENT: Negative.   Eyes: Negative.   Respiratory: Negative.  Cardiovascular: Negative.   Gastrointestinal: Negative.   Endocrine: Negative.   Genitourinary: Negative.   Musculoskeletal:       Spasms  Skin: Negative.   Allergic/Immunologic: Negative.   Neurological: Positive for weakness.  Hematological: Negative.   Psychiatric/Behavioral: Positive for dysphoric mood.  All other systems reviewed and are negative.      Objective:   Physical Exam  Constitutional: He is oriented to person, place, and time. He appears well-developed and well-nourished.  HENT:  Head: Normocephalic and atraumatic.  Neck: Normal range of motion. Neck supple.  Cardiovascular: Normal rate and regular rhythm.   Pulmonary/Chest: Effort normal and breath sounds normal.  Musculoskeletal:  Normal Muscle Bulk and Muscle Testing Reveals: Upper Extremities: Full ROM and Muscle Strength 5/5 Lumbar Paraspinal Tenderness: L-3-L-5 Lower Extremities: Full ROM and Muscle Strength 5/5 Arises from Table Slowly Antalgic Gait    Neurological: He is alert and oriented to person, place, and time.  Skin: Skin is warm and dry.  Psychiatric: He has a normal mood and affect.  Nursing note and vitals reviewed.         Assessment & Plan:  1. 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 He has lumbar degenerative disc disease. Continue Gabapentin :  Refilled: MS Contin 30 mg one tablet every 12 hours #60 and Oxycodone 10/325 mg one tablet every 6 hours as needed for pain #120.  We will continue the opioid monitoring program, this consists of regular clinic visits, examinations, urine drug screen, pill counts as well as use of [place] Controlled Substance reporting System. S/P Left-sided Lumbar Facet Radiofrequency Ablation under fluoroscopic guidance with IV sedation on November 8,2017 with Dr. Consuela Mimes, with good relief noted. 2. Sacroiliac Dysfunction: Continue with current medication, heat and exercise regime 3. Muscle Spasm: No complaints today.Continue to monitor  20 minutes of face to face patient care time was spent during this visit. All questions were encouraged and answered.  F/U in 1 month.

## 2016-05-05 ENCOUNTER — Encounter: Payer: Self-pay | Admitting: Pain Medicine

## 2016-05-05 ENCOUNTER — Ambulatory Visit: Payer: PPO | Attending: Pain Medicine | Admitting: Pain Medicine

## 2016-05-05 VITALS — BP 136/74 | HR 64 | Temp 98.3°F | Resp 18 | Ht 73.0 in | Wt 325.0 lb

## 2016-05-05 DIAGNOSIS — G894 Chronic pain syndrome: Secondary | ICD-10-CM | POA: Diagnosis not present

## 2016-05-05 DIAGNOSIS — K219 Gastro-esophageal reflux disease without esophagitis: Secondary | ICD-10-CM | POA: Diagnosis not present

## 2016-05-05 DIAGNOSIS — M5442 Lumbago with sciatica, left side: Secondary | ICD-10-CM | POA: Diagnosis not present

## 2016-05-05 DIAGNOSIS — Z79891 Long term (current) use of opiate analgesic: Secondary | ICD-10-CM | POA: Insufficient documentation

## 2016-05-05 DIAGNOSIS — M961 Postlaminectomy syndrome, not elsewhere classified: Secondary | ICD-10-CM | POA: Diagnosis not present

## 2016-05-05 DIAGNOSIS — M5416 Radiculopathy, lumbar region: Secondary | ICD-10-CM | POA: Diagnosis not present

## 2016-05-05 DIAGNOSIS — M545 Low back pain: Secondary | ICD-10-CM | POA: Diagnosis not present

## 2016-05-05 DIAGNOSIS — G8929 Other chronic pain: Secondary | ICD-10-CM

## 2016-05-05 NOTE — Progress Notes (Signed)
Safety precautions to be maintained throughout the outpatient stay will include: orient to surroundings, keep bed in low position, maintain call bell within reach at all times, provide assistance with transfer out of bed and ambulation.  

## 2016-05-05 NOTE — Progress Notes (Signed)
Patient's Name: Samuel [name]  MRN: [id number]  Referring Provider: No ref. provider found  DOB: 02/09/1976  PCP: Pcp Not In System  DOS: 05/05/2016  Note by: Gillie Crisci A. Dossie Arbour, MD  Service setting: Ambulatory outpatient  Specialty: Interventional Pain Management  Location: [facility] (AMB) Pain Management Facility    Patient type: Established   Primary Reason(s) for Visit: Encounter for post-procedure evaluation of chronic illness with mild to moderate exacerbation CC: Back Pain (low)  HPI  Samuel [name] is a 40 y.o. year old, male patient, who comes today for a post-procedure evaluation. 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 His primarily concern today is the Back Pain (low)  Pain Assessment: Self-Reported Pain Score: 4 /10             Reported level is compatible with observation.       Pain Type: Chronic pain Pain Location: Back Pain Orientation: Lower, Right, Left Pain Descriptors / Indicators: Aching, Stabbing (Throbbing from knees down) Pain Frequency: Constant  Samuel [name] comes in today for post-procedure evaluation after the treatment done on 04/01/2016.At this point, we have completed the bilateral lumbar facet  radiofrequency ablation of the L2, L3, L4, L5, and S1 medial branches. This has provided the patient with some relief of the pain, although he continues to walk with his back flexed having an antalgic gait and posture. He is somewhat improved, but he still having pain across the lower back and from the knees down. The pain that he was having in the upper legs around the thighs is now gone. This patient has had some extensive back surgery and I would recommend that they consider doing a caudal epidural steroid injection + diagnostic epidurogram to determine if he has significant epidural fibrosis. If he does, he may be a good candidate for a RACZ procedure. If none of this helps, I would consider the possibility of an intrathecal narcotic trial for possible pump implant. At this point we have completed what we were consulted to do and therefore he will return back to his primary physician for final disposition.  Further details on both, my assessment(s), as well as the proposed treatment plan, please see below.  Post-Procedure Assessment  04/01/2016 Procedure: Left lumbar facet RFA. Post-procedure pain score: 2/10 (More than 50% relief) Influential Factors: BMI: 42.88 kg/m Intra-procedural challenges: None observed Assessment challenges: None detected         Post-procedural side-effects, adverse reactions, or complications: None reported Reported issues: None  Sedation: Sedation provided. When no sedatives are used, the analgesic levels obtained are directly associated to the effectiveness of the local anesthetics. However, when sedation is provided, the level of analgesia obtained during the initial 1 hour following the intervention, is believed to be the result of a combination of factors. These factors may include, but are not limited to: 1. The effectiveness of the local  anesthetics used. 2. The effects of the analgesic(s) and/or anxiolytic(s) used. 3. The degree of discomfort experienced by the  patient at the time of the procedure. 4. The patients ability and reliability in recalling and recording the events. 5. The presence and influence of possible secondary gains and/or psychosocial factors. Reported result: Relief experienced during the 1st hour after the procedure: 50 % (Ultra-Short Term Relief) Interpretative annotation: Analgesia during this period is likely to be Local Anesthetic and/or IV Sedative (Analgesic/Anxiolitic) related.          Effects of local anesthetic: The analgesic effects attained during this period are directly associated to the localized infiltration of local anesthetics and therefore cary significant diagnostic value as to the etiological location, or anatomical origin, of the pain. Expected duration of relief is directly dependent on the pharmacodynamics of the local anesthetic used. Long-acting (4-6 hours) anesthetics used.  Reported result: Relief during the next 4 to 6 hour after the procedure: 50 % (Short-Term Relief) Interpretative annotation: Complete relief would suggest area to be the source of the pain.          Long-term benefit: Defined as the period of time past the expected duration of local anesthetics. With the possible exception of prolonged sympathetic blockade from the local anesthetics, benefits during this period are typically attributed to, or associated with, other factors such as analgesic sensory neuropraxia, antiinflammatory effects, or beneficial biochemical changes provided by agents other than the local anesthetics Reported result: Extended relief following procedure: 25 % (pain  that radiated into hips and upper legs is gone.  Back pain and lower leg pain remains and is the same as before injection.) (Long-Term Relief) Interpretative annotation: Partial relief. Possible incomplete therapeutic success. Benefit could signal adequate RF ablation  Current benefits: Defined as persistent relief that continues at this point in time.    Reported results: Treated area: 25 % In addition, the patient reports improvement in function Interpretative annotation: Partial benefit. This would suggest further treatment needed. A caudal epidural steroid injection + diagnostic epidurogram and depending on the amount of scar tissue identified, a possible Racz procedure.  Interpretation: Results would suggest therapy to have a positive impact on the patient's condition.          Laboratory Chemistry  Inflammation Markers No results found for: ESRSEDRATE, CRP Renal Function Lab Results  Component Value Date   BUN 9 06/13/2013   CREATININE 0.97 06/13/2013   GFRAA >90 06/13/2013   GFRNONAA >90 06/13/2013   Hepatic Function Lab Results  Component Value Date   AST 36 06/02/2013   ALT 47 06/02/2013   ALBUMIN 3.5 06/02/2013   Electrolytes Lab Results  Component Value Date   NA 136 (L) 06/13/2013   K 4.4 06/13/2013   CL 100 06/13/2013   CALCIUM 8.1 (L) 06/13/2013   Pain Modulating Vitamins No results found for: VD25OH, [id number], H157544, [id number], 25OHVITD1, 25OHVITD2, 25OHVITD3, VITAMINB12 Coagulation Parameters Lab Results  Component Value Date   PLT 138 (L) 06/13/2013   Cardiovascular Lab Results  Component Value Date   HGB 11.6 (L) 06/13/2013   HCT 34.1 (L) 06/13/2013   Note: Lab results reviewed.  Recent Diagnostic Imaging Review  Dg C-arm 1-60 Min-no Report  Result Date: 04/02/2016 Fluoroscopy was utilized by the requesting physician.  No radiographic interpretation.   Note: Imaging results reviewed.          Meds  The patient has a current medication list which includes the following prescription(s): gabapentin, ibuprofen, morphine, OVER THE  COUNTER MEDICATION, and oxycodone-acetaminophen.  Current Outpatient Prescriptions on File Prior to Visit  Medication Sig  . gabapentin (NEURONTIN) 300 MG capsule Take one Capsule in the Morning and Two Capsules at Bedtime  . ibuprofen (ADVIL,MOTRIN) 800 MG  tablet Take 1 tablet (800 mg total) by mouth every 8 (eight) hours as needed.  Marland Kitchen morphine (MS CONTIN) 30 MG 12 hr tablet Take 1 tablet (30 mg total) by mouth every 12 (twelve) hours.  Marland Kitchen OVER THE COUNTER MEDICATION Take 2 tablets by mouth daily. Generic Brand Stool Softner  . oxyCODONE-acetaminophen (PERCOCET) 10-325 MG tablet Take 1 tablet by mouth every 6 (six) hours as needed.   No current facility-administered medications on file prior to visit.    ROS  Constitutional: Denies any fever or chills Gastrointestinal: No reported hemesis, hematochezia, vomiting, or acute GI distress Musculoskeletal: Denies any acute onset joint swelling, redness, loss of ROM, or weakness Neurological: No reported episodes of acute onset apraxia, aphasia, dysarthria, agnosia, amnesia, paralysis, loss of coordination, or loss of consciousness  Allergies  Samuel [name] has No Known Allergies.  [name]  Drug: Samuel [name]  reports that he does not use drugs. Alcohol:  reports that he does not drink alcohol. Tobacco:  reports that he has never smoked. He has never used smokeless tobacco. Medical:  has a past medical history of Arthritis; GERD (gastroesophageal reflux disease); and Headache(784.0). Family: family history includes Hypertension in his father.  Past Surgical History:  Procedure Laterality Date  . BACK SURGERY     lower back surgery x 2   Constitutional Exam  General appearance: Well nourished, well developed, and well hydrated. In no apparent acute distress Vitals:   05/05/16 1041 05/05/16 1042  BP:  136/74  Pulse:  64  Resp:  18  Temp:  98.3 F (36.8 C)  SpO2:  99%  Weight: (!) 325 lb (147.4 kg)   Height: 6\' 1"  (1.854 m)    BMI Assessment: Estimated body mass index is 42.88 kg/m as calculated from the following:   Height as of this encounter: 6\' 1"  (1.854 m).   Weight as of this encounter: 325 lb (147.4 kg).  BMI interpretation table: BMI level Category Range association with higher  incidence of chronic pain  <18 kg/m2 Underweight   18.5-24.9 kg/m2 Ideal body weight   25-29.9 kg/m2 Overweight Increased incidence by 20%  30-34.9 kg/m2 Obese (Class I) Increased incidence by 68%  35-39.9 kg/m2 Severe obesity (Class II) Increased incidence by 136%  >40 kg/m2 Extreme obesity (Class III) Increased incidence by 254%   BMI Readings from Last 4 Encounters:  05/05/16 42.88 kg/m  04/01/16 42.22 kg/m  02/03/16 41.56 kg/m  12/24/15 42.88 kg/m   Wt Readings from Last 4 Encounters:  05/05/16 (!) 325 lb (147.4 kg)  04/01/16 (!) 320 lb (145.2 kg)  02/03/16 (!) 315 lb (142.9 kg)  12/24/15 (!) 325 lb (147.4 kg)  Psych/Mental status: Alert, oriented x 3 (person, place, & time) Eyes: PERLA Respiratory: No evidence of acute respiratory distress  Cervical Spine Exam  Inspection: No masses, redness, or swelling Alignment: Symmetrical Functional ROM: Unrestricted ROM Stability: No instability detected Muscle strength & Tone: Functionally intact Sensory: Unimpaired Palpation: Non-contributory  Upper Extremity (UE) Exam    Side: Right upper extremity  Side: Left upper extremity  Inspection: No masses, redness, swelling, or asymmetry  Inspection: No masses, redness, swelling, or asymmetry  Functional ROM: Unrestricted ROM          Functional ROM: Unrestricted ROM  Muscle strength & Tone: Functionally intact  Muscle strength & Tone: Functionally intact  Sensory: Unimpaired  Sensory: Unimpaired  Palpation: Non-contributory  Palpation: Non-contributory   Thoracic Spine Exam  Inspection: No masses, redness, or swelling Alignment: Symmetrical Functional ROM: Unrestricted ROM Stability: No instability detected Sensory: Unimpaired Muscle strength & Tone: Functionally intact Palpation: Non-contributory  Lumbar Spine Exam  Inspection: Well healed scar from previous spine surgery detected Alignment: Symmetrical Functional ROM: Decreased ROM Stability: No instability  detected Muscle strength & Tone: Functionally intact Sensory: Movement-associated discomfort Palpation: Complains of area being tender to palpation Provocative Tests: Lumbar Hyperextension and rotation test: Positive bilaterally for facet joint pain. Patrick's Maneuver: evaluation deferred today              Gait & Posture Assessment  Ambulation: Unassisted Gait: Relatively normal for age and body habitus Posture: WNL   Lower Extremity Exam    Side: Right lower extremity  Side: Left lower extremity  Inspection: No masses, redness, swelling, or asymmetry  Inspection: No masses, redness, swelling, or asymmetry  Functional ROM: Unrestricted ROM          Functional ROM: Unrestricted ROM          Muscle strength & Tone: Functionally intact  Muscle strength & Tone: Functionally intact  Sensory: Unimpaired  Sensory: Unimpaired  Palpation: Non-contributory  Palpation: Non-contributory   Assessment  Primary Diagnosis & Pertinent Problem List: The primary encounter diagnosis was Chronic pain syndrome. 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  Status Diagnosis   Stable  Stable  Stable 1. Chronic pain syndrome   2. Chronic low back pain (Location of Primary Source of Pain) (Bilateral) (R>L)   3. Chronic lumbar radicular pain (Location of Secondary source of pain) (Bilateral) (R>L) (L5 dermatome)   4. 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  New Prescriptions   No medications on file   Medications administered today: Samuel [name] had no medications administered during this visit. Lab-work, procedure(s), and/or referral(s): No orders of the defined types were placed in this encounter.  Imaging and/or referral(s): None  Interventional therapies: Planned, scheduled, and/or pending:   None at this time.    Considering:   None at this time.    Palliative PRN treatment(s):   Not at this time.   Provider-requested follow-up: Return if symptoms worsen or fail to improve.  Future Appointments Date Time Provider [place]  05/28/2016 10:00 AM Bayard Hugger, NP [facility] CPR  06/26/2016 10:00 AM Charlett Blake, MD [facility] None   Primary Care Physician: Pcp Not In System Location: [facility] Out[facility] Facility Note by: Kathlen Brunswick. Dossie Arbour, M.D, DABA, DABAPM, DABPM, DABIPP, FIPP Date: 05/05/16; Time: 1:07 PM  Pain Score Disclaimer: We use the NRS-11 scale. This is a self-reported, subjective measurement of pain severity with only modest accuracy. It is used primarily to identify changes within a particular patient. It must be understood that outpatient pain scales are significantly less accurate that those used for  research, where they can be applied under ideal controlled circumstances with minimal exposure to variables. In reality, the score is likely to be a combination of pain intensity and pain affect, where pain affect describes the degree of emotional arousal or changes in action readiness caused by the sensory experience of pain. Factors such as social and work situation, setting, emotional state, anxiety levels, expectation, and prior pain experience may influence pain perception and show large inter-individual differences that may also be affected by time variables.  Patient instructions provided during this appointment: There are no Patient Instructions on file for this visit.

## 2016-05-07 ENCOUNTER — Telehealth: Payer: Self-pay | Admitting: *Deleted

## 2016-05-20 ENCOUNTER — Telehealth: Payer: Self-pay | Admitting: *Deleted

## 2016-05-28 ENCOUNTER — Encounter: Payer: Self-pay | Admitting: Registered Nurse

## 2016-05-28 ENCOUNTER — Encounter: Payer: PPO | Attending: Physical Medicine & Rehabilitation | Admitting: Registered Nurse

## 2016-05-28 VITALS — BP 117/78 | HR 79 | Resp 14

## 2016-05-28 DIAGNOSIS — G8929 Other chronic pain: Secondary | ICD-10-CM | POA: Diagnosis not present

## 2016-05-28 DIAGNOSIS — Z79899 Other long term (current) drug therapy: Secondary | ICD-10-CM | POA: Diagnosis not present

## 2016-05-28 DIAGNOSIS — M545 Low back pain: Secondary | ICD-10-CM | POA: Diagnosis not present

## 2016-05-28 DIAGNOSIS — M5416 Radiculopathy, lumbar region: Secondary | ICD-10-CM

## 2016-05-28 DIAGNOSIS — M961 Postlaminectomy syndrome, not elsewhere classified: Secondary | ICD-10-CM | POA: Diagnosis not present

## 2016-05-28 DIAGNOSIS — Z981 Arthrodesis status: Secondary | ICD-10-CM | POA: Diagnosis not present

## 2016-05-28 DIAGNOSIS — G894 Chronic pain syndrome: Secondary | ICD-10-CM | POA: Diagnosis not present

## 2016-05-28 DIAGNOSIS — G8928 Other chronic postprocedural pain: Secondary | ICD-10-CM | POA: Diagnosis not present

## 2016-05-28 DIAGNOSIS — Z5181 Encounter for therapeutic drug level monitoring: Secondary | ICD-10-CM

## 2016-05-28 DIAGNOSIS — M79604 Pain in right leg: Secondary | ICD-10-CM | POA: Diagnosis not present

## 2016-05-28 DIAGNOSIS — M79605 Pain in left leg: Secondary | ICD-10-CM | POA: Insufficient documentation

## 2016-05-28 MED ORDER — OXYCODONE-ACETAMINOPHEN 10-325 MG PO TABS: 1.0000 | ORAL_TABLET | Freq: Four times a day (QID) | ORAL | 0 refills | Status: DC | PRN

## 2016-05-28 MED ORDER — MORPHINE SULFATE ER 30 MG PO TBCR: 30.0000 mg | EXTENDED_RELEASE_TABLET | Freq: Two times a day (BID) | ORAL | 0 refills | Status: DC

## 2016-05-28 NOTE — Addendum Note (Signed)
Addended by: Marland Mcalpine B on: 05/28/2016 03:18 PM   Modules accepted: Orders

## 2016-05-28 NOTE — Progress Notes (Signed)
Subjective:    Patient ID: Samuel [name], male    DOB: 01/08/1976, 41 y.o.   MRN: [id number]  HPI: [name] is a 41year old male who returns for follow up appointment for chronic pain and medication refill. He states his pain is located in his lower back radiating into his bilateral lower extremities laterally. He rates his pain 6. His current exercise regime is performing stretching exercises and walking short distances.   [name] is had Left MBB Radiofrequency with Dr. Dossie Arbour on 04/01/2016, with relief noted.   Pain Inventory Average Pain 6 Pain Right Now 6 My pain is constant, sharp, dull, stabbing and aching  In the last 24 hours, has pain interfered with the following? General activity 9 Relation with others 9 Enjoyment of life 9 What TIME of day is your pain at its worst? morning, evening, night Sleep (in general) Fair  Pain is worse with: walking, bending, sitting and standing Pain improves with: rest and medication Relief from Meds: 4  Mobility how many minutes can you walk? 5 ability to climb steps?  yes do you drive?  yes Do you have any goals in this area?  no  Function disabled: date disabled 2010 I need assistance with the following:  dressing, bathing, meal prep, household duties and shopping Do you have any goals in this area?  no  Neuro/Psych weakness spasms depression  Prior Studies Any changes since last visit?  no  Physicians involved in your care Any changes since last visit?  no   Family History  Problem Relation Age of Onset  . Hypertension Father    Social History   Social History  . Marital status: Married    Spouse name: N/A  . Number of children: N/A  . Years of education: N/A   Social History Main Topics  . Smoking status: Never Smoker  . Smokeless tobacco: Never Used  . Alcohol use No  . Drug use: No  . Sexual activity: Not Asked   Other Topics Concern  . None   Social History Narrative  . None   Past  Surgical History:  Procedure Laterality Date  . BACK SURGERY     lower back surgery x 2   Past Medical History:  Diagnosis Date  . Arthritis    "some in my back"  . GERD (gastroesophageal reflux disease)   . Headache(784.0)    BP 117/78   Pulse 79   Resp 14   SpO2 97%   Opioid Risk Score:   Fall Risk Score:  `1  Depression screen PHQ 2/9  Depression screen [facility] 2/9 05/05/2016 04/01/2016 02/03/2016 01/30/2016 01/01/2016 11/05/2015 09/05/2015  Decreased Interest 0 - 0 0 0 0 0  Down, Depressed, Hopeless 0 0 0 0 0 0 0  PHQ - 2 Score 0 0 0 0 0 0 0  Altered sleeping - - - - - - -  Tired, decreased energy - - - - - - -  Change in appetite - - - - - - -  Feeling bad or failure about yourself  - - - - - - -  Trouble concentrating - - - - - - -  Moving slowly or fidgety/restless - - - - - - -  Suicidal thoughts - - - - - - -  PHQ-9 Score - - - - - - -  Difficult doing work/chores - - - - - - -      Review of Systems  Objective:   Physical Exam  Constitutional: He is oriented to person, place, and time. He appears well-developed and well-nourished.  HENT:  Head: Normocephalic and atraumatic.  Neck: Normal range of motion. Neck supple.  Cardiovascular: Normal rate and regular rhythm.   Pulmonary/Chest: Effort normal and breath sounds normal.  Musculoskeletal:  Normal Muscle Bulk and Muscle Testing Reveals: Upper Extremities: Full ROM and Muscle Strength 5/5 Lumbar Paraspinal Tenderness: L-3-L-5 Lower Extremities: Decreased ROM and Muscle Strength 4/5 Bilateral Lower Extremities Flexion Produces Pain unto Lumbar Arises from Table slowly  Antalgic Gait    Neurological: He is alert and oriented to person, place, and time.  Skin: Skin is warm and dry.  Psychiatric: He has a normal mood and affect.  Nursing note and vitals reviewed.         Assessment & Plan:  1. 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 He has lumbar degenerative disc  disease. Continue Gabapentin :  Refilled: MS Contin 30 mg one tablet every 12 hours #60 and Oxycodone 10/325 mg one tablet every 6 hours as needed for pain #120.  We will continue the opioid monitoring program, this consists of regular clinic visits, examinations, urine drug screen, pill counts as well as use of [place] Controlled Substance reporting System. S/P Left-sided Lumbar Facet Radiofrequency Ablation under fluoroscopic guidance with IV sedation on November 8,2017 withDr. Consuela Mimes, with good relief noted. 2. Sacroiliac Dysfunction: Continue with current medication, heat and exercise regime 3. Muscle Spasm: No complaints today.Continue to monitor  20 minutes of face to face patient care time was spent during this visit. All questions were encouraged and answered.  F/U in 1 month.

## 2016-06-03 LAB — 6-ACETYLMORPHINE,TOXASSURE ADD
6-ACETYLMORPHINE: NEGATIVE
6-acetylmorphine: NOT DETECTED ng/mg creat

## 2016-06-03 LAB — TOXASSURE SELECT,+ANTIDEPR,UR

## 2016-06-03 NOTE — Progress Notes (Signed)
Urine drug screen for this encounter is consistent for prescribed medication 

## 2016-06-26 ENCOUNTER — Ambulatory Visit: Payer: Self-pay | Admitting: Physical Medicine & Rehabilitation

## 2016-06-30 ENCOUNTER — Encounter: Payer: Self-pay | Admitting: Registered Nurse

## 2016-06-30 ENCOUNTER — Encounter: Payer: PPO | Attending: Physical Medicine & Rehabilitation | Admitting: Registered Nurse

## 2016-06-30 VITALS — BP 121/75 | HR 76 | Resp 14

## 2016-06-30 DIAGNOSIS — Z79899 Other long term (current) drug therapy: Secondary | ICD-10-CM

## 2016-06-30 DIAGNOSIS — M79604 Pain in right leg: Secondary | ICD-10-CM | POA: Diagnosis not present

## 2016-06-30 DIAGNOSIS — M545 Low back pain: Secondary | ICD-10-CM | POA: Insufficient documentation

## 2016-06-30 DIAGNOSIS — G894 Chronic pain syndrome: Secondary | ICD-10-CM

## 2016-06-30 DIAGNOSIS — M5416 Radiculopathy, lumbar region: Secondary | ICD-10-CM

## 2016-06-30 DIAGNOSIS — Z981 Arthrodesis status: Secondary | ICD-10-CM | POA: Diagnosis not present

## 2016-06-30 DIAGNOSIS — G8928 Other chronic postprocedural pain: Secondary | ICD-10-CM | POA: Diagnosis not present

## 2016-06-30 DIAGNOSIS — M79605 Pain in left leg: Secondary | ICD-10-CM | POA: Insufficient documentation

## 2016-06-30 DIAGNOSIS — Z5181 Encounter for therapeutic drug level monitoring: Secondary | ICD-10-CM | POA: Diagnosis not present

## 2016-06-30 DIAGNOSIS — M961 Postlaminectomy syndrome, not elsewhere classified: Secondary | ICD-10-CM | POA: Diagnosis not present

## 2016-06-30 DIAGNOSIS — G8929 Other chronic pain: Secondary | ICD-10-CM

## 2016-06-30 MED ORDER — MORPHINE SULFATE ER 30 MG PO TBCR: 30.0000 mg | EXTENDED_RELEASE_TABLET | Freq: Two times a day (BID) | ORAL | 0 refills | Status: DC

## 2016-06-30 MED ORDER — OXYCODONE-ACETAMINOPHEN 10-325 MG PO TABS: 1.0000 | ORAL_TABLET | Freq: Four times a day (QID) | ORAL | 0 refills | Status: DC | PRN

## 2016-06-30 NOTE — Progress Notes (Signed)
Subjective:    Patient ID: Samuel [name], male    DOB: 13-May-1976, 41 y.o.   MRN: [id number]  HPI: Samuel [name] is a 41year old male who returns for follow up appointment for chronic pain and medication refill. He states his pain is located in his lower back. He rates his pain 6. His current exercise regime is performing stretching exercises occassionally and walking short distances.   Samuel [name] is hadLeft MBB Radiofrequency with Dr. Dossie Arbour on 04/01/2016, with relief noted.   Pain Inventory Average Pain 6 Pain Right Now 6 My pain is constant, sharp, dull, stabbing and aching  In the last 24 hours, has pain interfered with the following? General activity 8 Relation with others 8 Enjoyment of life 8 What TIME of day is your pain at its worst? morning, evening, night Sleep (in general) Fair  Pain is worse with: walking, bending, sitting and standing Pain improves with: rest and medication Relief from Meds: 5  Mobility walk with assistance use a cane how many minutes can you walk? 5 ability to climb steps?  yes do you drive?  yes Do you have any goals in this area?  no  Function disabled: date disabled . I need assistance with the following:  dressing, bathing, meal prep, household duties and shopping Do you have any goals in this area?  no  Neuro/Psych weakness tingling trouble walking spasms  Prior Studies Any changes since last visit?  no  Physicians involved in your care Any changes since last visit?  no   Family History  Problem Relation Age of Onset  . Hypertension Father    Social History   Social History  . Marital status: Married    Spouse name: N/A  . Number of children: N/A  . Years of education: N/A   Social History Main Topics  . Smoking status: Never Smoker  . Smokeless tobacco: Never Used  . Alcohol use No  . Drug use: No  . Sexual activity: Not Asked   Other Topics Concern  . None   Social History Narrative  . None   Past  Surgical History:  Procedure Laterality Date  . BACK SURGERY     lower back surgery x 2   Past Medical History:  Diagnosis Date  . Arthritis    "some in my back"  . GERD (gastroesophageal reflux disease)   . Headache(784.0)    BP 121/75 (BP Location: Left Arm, Patient Position: Sitting, Cuff Size: Large)   Pulse 76   Resp 14   SpO2 96%   Opioid Risk Score:   Fall Risk Score:  `1  Depression screen PHQ 2/9  Depression screen [facility] 2/9 05/05/2016 04/01/2016 02/03/2016 01/30/2016 01/01/2016 11/05/2015 09/05/2015  Decreased Interest 0 - 0 0 0 0 0  Down, Depressed, Hopeless 0 0 0 0 0 0 0  PHQ - 2 Score 0 0 0 0 0 0 0  Altered sleeping - - - - - - -  Tired, decreased energy - - - - - - -  Change in appetite - - - - - - -  Feeling bad or failure about yourself  - - - - - - -  Trouble concentrating - - - - - - -  Moving slowly or fidgety/restless - - - - - - -  Suicidal thoughts - - - - - - -  PHQ-9 Score - - - - - - -  Difficult doing work/chores - - - - - - -  Review of Systems  HENT: Negative.   Eyes: Negative.   Respiratory: Negative.   Cardiovascular: Negative.   Gastrointestinal: Negative.   Endocrine: Negative.   Genitourinary: Negative.   Musculoskeletal: Positive for back pain and gait problem.       Spasms   Skin: Negative.   Allergic/Immunologic: Negative.   Neurological: Positive for weakness.       Tingling  Hematological: Negative.   Psychiatric/Behavioral: Negative.   All other systems reviewed and are negative.      Objective:   Physical Exam  Constitutional: He is oriented to person, place, and time. He appears well-developed and well-nourished.  HENT:  Head: Normocephalic and atraumatic.  Neck: Normal range of motion. Neck supple.  Cardiovascular: Normal rate and regular rhythm.   Pulmonary/Chest: Effort normal and breath sounds normal.  Musculoskeletal:  Normal Muscle Bulk and Muscle Testing Reveals: Upper Extremities: Full ROM and Muscle Strength  5/5 Lumbar Hypersensitivity Lower Extremities: Full ROM and Muscle Strength 5/5 Right Lower Extremity Flexion Produces Pain into Lumbar Arises from chair slowly Antalgic Gait  Neurological: He is alert and oriented to person, place, and time.  Skin: Skin is warm and dry.  Psychiatric: He has a normal mood and affect.  Nursing note and vitals reviewed.         Assessment & Plan:  1. 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 He has lumbar degenerative disc disease. Continue Gabapentin : 06/30/2016 Refilled: MS Contin 30 mg one tablet every 12 hours #60 and Oxycodone 10/325 mg one tablet every 6 hours as needed for pain #120.  We will continue the opioid monitoring program, this consists of regular clinic visits, examinations, urine drug screen, pill counts as well as use of [place] Controlled Substance reporting System. S/PLeft-sided Lumbar Facet Radiofrequency Ablation under fluoroscopic guidance with IV sedation on November 8,2017 withDr. Consuela Mimes, with good relief noted. 2. Sacroiliac Dysfunction: Continue with current medication, heat and exercise regime. 06/30/2016  3. Muscle Spasm: No complaints today.Continue to monitor.06/30/2016  20 minutes of face to face patient care time was spent during this visit. All questions were encouraged and answered.   F/U in 1 month.

## 2016-07-02 ENCOUNTER — Telehealth: Payer: Self-pay | Admitting: Registered Nurse

## 2016-07-02 NOTE — Telephone Encounter (Signed)
On 07/02/2016 the  [place] was reviewed no conflict was seen on the [place] with multiple prescribers. Samuel [name] has a signed narcotic contract with our office. If there were any discrepancies this would have been reported to his physician.

## 2016-07-27 ENCOUNTER — Encounter: Payer: Self-pay | Admitting: Physical Medicine & Rehabilitation

## 2016-07-27 ENCOUNTER — Ambulatory Visit (HOSPITAL_BASED_OUTPATIENT_CLINIC_OR_DEPARTMENT_OTHER): Payer: PPO | Admitting: Physical Medicine & Rehabilitation

## 2016-07-27 ENCOUNTER — Encounter: Payer: PPO | Attending: Physical Medicine & Rehabilitation

## 2016-07-27 VITALS — BP 132/79 | HR 69 | Resp 16

## 2016-07-27 DIAGNOSIS — M79605 Pain in left leg: Secondary | ICD-10-CM | POA: Diagnosis not present

## 2016-07-27 DIAGNOSIS — Z981 Arthrodesis status: Secondary | ICD-10-CM | POA: Diagnosis not present

## 2016-07-27 DIAGNOSIS — M79604 Pain in right leg: Secondary | ICD-10-CM | POA: Insufficient documentation

## 2016-07-27 DIAGNOSIS — M545 Low back pain: Secondary | ICD-10-CM | POA: Insufficient documentation

## 2016-07-27 DIAGNOSIS — G8928 Other chronic postprocedural pain: Secondary | ICD-10-CM | POA: Diagnosis not present

## 2016-07-27 DIAGNOSIS — M961 Postlaminectomy syndrome, not elsewhere classified: Secondary | ICD-10-CM

## 2016-07-27 DIAGNOSIS — G894 Chronic pain syndrome: Secondary | ICD-10-CM | POA: Diagnosis not present

## 2016-07-27 MED ORDER — MORPHINE SULFATE ER 30 MG PO TBCR: 30.0000 mg | EXTENDED_RELEASE_TABLET | Freq: Two times a day (BID) | ORAL | 0 refills | Status: DC

## 2016-07-27 MED ORDER — OXYCODONE-ACETAMINOPHEN 10-325 MG PO TABS: 1.0000 | ORAL_TABLET | Freq: Four times a day (QID) | ORAL | 0 refills | Status: DC | PRN

## 2016-07-27 NOTE — Progress Notes (Signed)
Subjective:    Patient ID: Quintus Greear, male    DOB: Aug 28, 1975, 41 y.o.   MRN: [id number]  HPI Home schooling 21 yo son.  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 Hx L3-S1 fusion, 2015 Radiofreq neurotomy performed by Dr Lorella Nimrod [place] significant improvement in lumbar pain. Discussed recs for possible intrathecal pump Pain Inventory Average Pain 5 Pain Right Now 6 My pain is constant, sharp, burning, dull and stabbing  In the last 24 hours, has pain interfered with the following? General activity 8 Relation with others 8 Enjoyment of life 9 What TIME of day is your pain at its worst? morning evening and night Sleep (in general) Fair  Pain is worse with: walking, bending, sitting and standing Pain improves with: rest and medication Relief from Meds: 5  Mobility use a cane how many minutes can you walk? 5 ability to climb steps?  yes do you drive?  yes  Function disabled: date disabled 2010 I need assistance with the following:  dressing, bathing, meal prep, household duties and shopping  Neuro/Psych weakness trouble walking depression  Prior Studies Any changes since last visit?  no  Physicians involved in your care Any changes since last visit?  no   Family History  Problem Relation Age of Onset  . Hypertension Father    Social History   Social History  . Marital status: Married    Spouse name: N/A  . Number of children: N/A  . Years of education: N/A   Social History Main Topics  . Smoking status: Never Smoker  . Smokeless tobacco: Never Used  . Alcohol use No  . Drug use: No  . Sexual activity: Not Asked   Other Topics Concern  . None   Social History Narrative  . None   Past Surgical History:  Procedure Laterality Date  . BACK SURGERY     lower back surgery x 2   Past Medical History:  Diagnosis Date  . Arthritis    "some in my back"  . GERD (gastroesophageal reflux disease)   . Headache(784.0)    BP 132/79    Pulse 69   Resp 16   SpO2 96%   Opioid Risk Score:   Fall Risk Score:  `1  Depression screen PHQ 2/9  Depression screen [facility] 2/9 07/27/2016 05/05/2016 04/01/2016 02/03/2016 01/30/2016 01/01/2016 11/05/2015  Decreased Interest 0 0 - 0 0 0 0  Down, Depressed, Hopeless 0 0 0 0 0 0 0  PHQ - 2 Score 0 0 0 0 0 0 0  Altered sleeping - - - - - - -  Tired, decreased energy - - - - - - -  Change in appetite - - - - - - -  Feeling bad or failure about yourself  - - - - - - -  Trouble concentrating - - - - - - -  Moving slowly or fidgety/restless - - - - - - -  Suicidal thoughts - - - - - - -  PHQ-9 Score - - - - - - -  Difficult doing work/chores - - - - - - -   Review of Systems  HENT: Negative.   Eyes: Negative.   Respiratory: Negative.   Cardiovascular: Negative.   Gastrointestinal: Negative.   Endocrine: Negative.   Genitourinary: Negative.   Musculoskeletal: Positive for gait problem.  Skin: Negative.   Allergic/Immunologic: Negative.   Neurological: Positive for weakness.  Psychiatric/Behavioral: Positive for dysphoric mood.  All other  systems reviewed and are negative.      Objective:   Physical Exam  Constitutional: He is oriented to person, place, and time. He appears well-developed and well-nourished.  HENT:  Head: Normocephalic and atraumatic.  Neurological: He is alert and oriented to person, place, and time.  Psychiatric: He has a normal mood and affect.  Nursing note and vitals reviewed.   Decreased sensation left L5 and left L4 dermatomal distribution to pinprick. Give away weakness bilateral hip flexors, knee extensors, ankle dorsiflexors.     Assessment & Plan:  1.  Lumbar postlaminectomy syndrome with chronic pain  The [place] control substances reporting system was reviewed. There currently is no evidence of multiple prescribers for opiates and other controlled substances. Patients on chronic opioids will have this reviewed at minimum every 3  months.  At this time, we'll continue MS Contin 30 mg every 12 hours Continue oxycodone 10 mg every 6 hours  UDS from 05/28/2016 reviewed, appropriate  We discussed other treatment options including Nucynta, which patient states was too expensive. We also discussed morpho Bond, this is a tamper resistant long-acting morphine  We discussed discontinuing oxycodone and only using morphine extended release 60 mg twice a day  Other option would be Xtampza 40mg  BID  Also discussed neuropsych, patient does not wish to pursue at the current time  Over half of the 25 min visit was spent counseling and coordinating care.

## 2016-07-27 NOTE — Patient Instructions (Addendum)
Please let Zella Ball know if you'd like to see Neuropsychologist Dr Sima Matas  We may substitute MS IR 15mg  4 times a day in place of oxycodone 10mg  4 x per day

## 2016-08-27 ENCOUNTER — Encounter: Payer: PPO | Attending: Physical Medicine & Rehabilitation | Admitting: Registered Nurse

## 2016-08-27 ENCOUNTER — Encounter: Payer: Self-pay | Admitting: Registered Nurse

## 2016-08-27 VITALS — BP 130/81 | HR 78

## 2016-08-27 DIAGNOSIS — G894 Chronic pain syndrome: Secondary | ICD-10-CM

## 2016-08-27 DIAGNOSIS — G8929 Other chronic pain: Secondary | ICD-10-CM

## 2016-08-27 DIAGNOSIS — M961 Postlaminectomy syndrome, not elsewhere classified: Secondary | ICD-10-CM | POA: Insufficient documentation

## 2016-08-27 DIAGNOSIS — M5416 Radiculopathy, lumbar region: Secondary | ICD-10-CM | POA: Diagnosis not present

## 2016-08-27 DIAGNOSIS — G8928 Other chronic postprocedural pain: Secondary | ICD-10-CM | POA: Insufficient documentation

## 2016-08-27 DIAGNOSIS — Z981 Arthrodesis status: Secondary | ICD-10-CM | POA: Insufficient documentation

## 2016-08-27 DIAGNOSIS — Z5181 Encounter for therapeutic drug level monitoring: Secondary | ICD-10-CM

## 2016-08-27 DIAGNOSIS — M79604 Pain in right leg: Secondary | ICD-10-CM | POA: Insufficient documentation

## 2016-08-27 DIAGNOSIS — M545 Low back pain: Secondary | ICD-10-CM | POA: Diagnosis not present

## 2016-08-27 DIAGNOSIS — M79605 Pain in left leg: Secondary | ICD-10-CM | POA: Diagnosis not present

## 2016-08-27 DIAGNOSIS — Z79899 Other long term (current) drug therapy: Secondary | ICD-10-CM | POA: Diagnosis not present

## 2016-08-27 MED ORDER — MORPHINE SULFATE 15 MG PO TABS: 15.0000 mg | ORAL_TABLET | Freq: Four times a day (QID) | ORAL | 0 refills | Status: DC | PRN

## 2016-08-27 MED ORDER — OXYCODONE-ACETAMINOPHEN 10-325 MG PO TABS: 1.0000 | ORAL_TABLET | Freq: Four times a day (QID) | ORAL | 0 refills | Status: DC | PRN

## 2016-08-27 MED ORDER — MORPHINE SULFATE ER 30 MG PO TBCR: 30.0000 mg | EXTENDED_RELEASE_TABLET | Freq: Two times a day (BID) | ORAL | 0 refills | Status: DC

## 2016-08-27 NOTE — Progress Notes (Signed)
Subjective:    Patient ID: Samuel [name], male    DOB: [birth year], 41 y.o.   MRN: [id number]  HPI:  Samuel [name] is a 41year old male who returns for follow up appointmentfor chronic pain and medication refill. He states his pain is located in his lower back radiating into his lower extremities. He rates his pain 7. His current exercise regime is performing stretching exercises occassionally and walking short distances.  [name] note was reviewed, Oxycodone discontinue and he was prescribed MSIR 15 mg QID.   Samuel [name] is hadLeft MBB Radiofrequency with Dr. Dossie Arbour on 04/01/2016, with relief noted.    Pain Inventory Average Pain 7 Pain Right Now 7 My pain is sharp, dull, stabbing and aching  In the last 24 hours, has pain interfered with the following? General activity 8 Relation with others 9 Enjoyment of life 9 What TIME of day is your pain at its worst? all Sleep (in general) Fair  Pain is worse with: walking, bending, sitting and standing Pain improves with: rest and medication Relief from Meds: 5  Mobility use a cane ability to climb steps?  yes do you drive?  yes  Function disabled: date disabled 2010 I need assistance with the following:  dressing, bathing, meal prep, household duties and shopping  Neuro/Psych weakness trouble walking spasms  Prior Studies Any changes since last visit?  no  Physicians involved in your care Any changes since last visit?  no   Family History  Problem Relation Age of Onset  . Hypertension Father    Social History   Social History  . Marital status: Married    Spouse name: N/A  . Number of children: N/A  . Years of education: N/A   Social History Main Topics  . Smoking status: Never Smoker  . Smokeless tobacco: Never Used  . Alcohol use No  . Drug use: No  . Sexual activity: Not on file   Other Topics Concern  . Not on file   Social History Narrative  . No narrative on file   Past  Surgical History:  Procedure Laterality Date  . BACK SURGERY     lower back surgery x 2   Past Medical History:  Diagnosis Date  . Arthritis    "some in my back"  . GERD (gastroesophageal reflux disease)   . Headache(784.0)    There were no vitals taken for this visit.  Opioid Risk Score:   Fall Risk Score:  `1  Depression screen PHQ 2/9  Depression screen [facility] 2/9 07/27/2016 05/05/2016 04/01/2016 02/03/2016 01/30/2016 01/01/2016 11/05/2015  Decreased Interest 1 0 - 0 0 0 0  Down, Depressed, Hopeless 1 0 0 0 0 0 0  PHQ - 2 Score 2 0 0 0 0 0 0  Altered sleeping - - - - - - -  Tired, decreased energy - - - - - - -  Change in appetite - - - - - - -  Feeling bad or failure about yourself  - - - - - - -  Trouble concentrating - - - - - - -  Moving slowly or fidgety/restless - - - - - - -  Suicidal thoughts - - - - - - -  PHQ-9 Score - - - - - - -  Difficult doing work/chores - - - - - - -    Review of Systems  Constitutional: Negative.   HENT: Negative.   Eyes: Negative.   Respiratory: Negative.  Cardiovascular: Negative.   Gastrointestinal: Negative.   Endocrine: Negative.   Genitourinary: Negative.   Musculoskeletal: Negative.   Skin: Negative.   Allergic/Immunologic: Negative.   Neurological: Negative.   Hematological: Negative.   Psychiatric/Behavioral: Negative.   All other systems reviewed and are negative.      Objective:   Physical Exam  Constitutional: He is oriented to person, place, and time. He appears well-developed and well-nourished.  HENT:  Head: Normocephalic and atraumatic.  Neck: Normal range of motion. Neck supple.  Cardiovascular: Normal rate and regular rhythm.   Pulmonary/Chest: Effort normal and breath sounds normal.  Musculoskeletal:  Normal Muscle Bulk and Muscle Testing Reveals: Upper Extremities: Full ROM and Muscle Strength 5/5 Lumbar Hypersensitivity Lower Extremities: Full ROM and Muscle Strength 5/5 Arises from Table Slowly    Antalgic Gait  Neurological: He is alert and oriented to person, place, and time.  Skin: Skin is warm and dry.  Psychiatric: He has a normal mood and affect.  Nursing note and vitals reviewed.         Assessment & Plan:  1. 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 He has lumbar degenerative disc disease. Continue Gabapentin : 08/27/2016 Refilled: MS Contin30 mg one tablet every 12 hours #60 and MSIR 15 mg one tablet every 6 hours as needed for pain #120. We will continue the opioid monitoring program, this consists of regular clinic visits, examinations, urine drug screen, pill counts as well as use of [place] Controlled Substance reporting System. S/PLeft-sided Lumbar Facet Radiofrequency Ablation under fluoroscopic guidance with IV sedation on November 8,2017 withDr. Consuela Mimes, with good relief noted. 2. Sacroiliac Dysfunction: Continue with current medication, heat and exercise regime. 08/27/2016  3. Muscle Spasm: No complaints today.Continue to monitor.08/27/2016  20 minutes of face to face patient care time was spent during this visit. All questions were encouraged and answered.  F/U in 1 month.

## 2016-08-28 DIAGNOSIS — H04123 Dry eye syndrome of bilateral lacrimal glands: Secondary | ICD-10-CM | POA: Diagnosis not present

## 2016-08-28 DIAGNOSIS — H40033 Anatomical narrow angle, bilateral: Secondary | ICD-10-CM | POA: Diagnosis not present

## 2016-09-03 LAB — 6-ACETYLMORPHINE,TOXASSURE ADD
6-ACETYLMORPHINE: NEGATIVE
6-acetylmorphine: NOT DETECTED ng/mg creat

## 2016-09-03 LAB — TOXASSURE SELECT,+ANTIDEPR,UR

## 2016-09-07 ENCOUNTER — Telehealth: Payer: Self-pay | Admitting: Registered Nurse

## 2016-09-07 NOTE — Telephone Encounter (Signed)
Spoke with [name], he states since the change to the MSIR, he's not receiving any relief of pain. Also stated he has noticed itching/ hives on his lower extremities once he started the MSIR. Denies using any new products.He has been on MS Contin for two years, discussed with pharmacist at [facility], could be a reaction to the fillers, most likely not to the MS Contin. I explained the above to Samuel [name], he verbalizes understanding. He was instructed to call his insurance company, if they are willing to pay for the Oxycodone will prescribe. He verbalizes understanding.

## 2016-09-08 ENCOUNTER — Telehealth: Payer: Self-pay

## 2016-09-08 MED ORDER — OXYCODONE HCL 10 MG PO TABS: 10.0000 mg | ORAL_TABLET | Freq: Four times a day (QID) | ORAL | 0 refills | Status: DC | PRN

## 2016-09-08 NOTE — Telephone Encounter (Signed)
Requesting a phone call in regards to a previous conversation about a medication

## 2016-09-08 NOTE — Telephone Encounter (Signed)
[name] will resume the Oxycodone 10 mg Q 6 hours, he had a reaction with the MSIR fillers, spoke with the pharmacist who states this can be a possibility and pain was not controlled.  [name] called his insurance company and they will approve the medication. He will pick up the prescription in the morning.

## 2016-09-09 ENCOUNTER — Telehealth: Payer: Self-pay | Admitting: Registered Nurse

## 2016-09-09 NOTE — Telephone Encounter (Signed)
[name] came to pick up his Oxycodone prescription.  I counted his MSIR tablets #91, he picked up the [place] on 08/31/2016 #120. [name] was given the Oxycodone prescription, also return the MSIR to him, in case their is a problem with his insurance. He was instructed to bring the MSIR to his next appointment, or Pharmacist can destroy and call the office. He verbalizes understanding.

## 2016-09-10 ENCOUNTER — Telehealth: Payer: Self-pay | Admitting: *Deleted

## 2016-09-10 NOTE — Telephone Encounter (Signed)
Urine drug screen for this encounter is consistent for prescribed medication. He was taking oxycodone and Morphine Sulfate ER @ this appointment.

## 2016-09-23 ENCOUNTER — Encounter: Payer: PPO | Attending: Physical Medicine & Rehabilitation | Admitting: Registered Nurse

## 2016-09-23 ENCOUNTER — Encounter: Payer: Self-pay | Admitting: Registered Nurse

## 2016-09-23 ENCOUNTER — Telehealth: Payer: Self-pay | Admitting: Registered Nurse

## 2016-09-23 VITALS — BP 116/77 | HR 75

## 2016-09-23 DIAGNOSIS — G8929 Other chronic pain: Secondary | ICD-10-CM | POA: Diagnosis not present

## 2016-09-23 DIAGNOSIS — M5416 Radiculopathy, lumbar region: Secondary | ICD-10-CM

## 2016-09-23 DIAGNOSIS — M545 Low back pain: Secondary | ICD-10-CM | POA: Diagnosis not present

## 2016-09-23 DIAGNOSIS — G8928 Other chronic postprocedural pain: Secondary | ICD-10-CM | POA: Insufficient documentation

## 2016-09-23 DIAGNOSIS — Z5181 Encounter for therapeutic drug level monitoring: Secondary | ICD-10-CM

## 2016-09-23 DIAGNOSIS — M533 Sacrococcygeal disorders, not elsewhere classified: Secondary | ICD-10-CM

## 2016-09-23 DIAGNOSIS — M961 Postlaminectomy syndrome, not elsewhere classified: Secondary | ICD-10-CM

## 2016-09-23 DIAGNOSIS — Z79899 Other long term (current) drug therapy: Secondary | ICD-10-CM

## 2016-09-23 DIAGNOSIS — M79605 Pain in left leg: Secondary | ICD-10-CM | POA: Diagnosis not present

## 2016-09-23 DIAGNOSIS — G894 Chronic pain syndrome: Secondary | ICD-10-CM

## 2016-09-23 DIAGNOSIS — M79604 Pain in right leg: Secondary | ICD-10-CM | POA: Insufficient documentation

## 2016-09-23 DIAGNOSIS — Z981 Arthrodesis status: Secondary | ICD-10-CM | POA: Diagnosis not present

## 2016-09-23 MED ORDER — OXYCODONE HCL 10 MG PO TABS: 10.0000 mg | ORAL_TABLET | Freq: Four times a day (QID) | ORAL | 0 refills | Status: DC | PRN

## 2016-09-23 MED ORDER — MORPHINE SULFATE ER 30 MG PO TBCR: 30.0000 mg | EXTENDED_RELEASE_TABLET | Freq: Two times a day (BID) | ORAL | 0 refills | Status: DC

## 2016-09-23 NOTE — Progress Notes (Signed)
Subjective:    Patient ID: Samuel [name], male    DOB: [birth year], 41 y.o.   MRN: [id number]  HPI: [name] is a 41year old male who returns for follow up appointmentfor chronic pain and medication refill. He states his pain is located in his lower back radiating into his lower extremities.Also states he occasionally has left hip pain with walking or long periods of standing.  He rates his pain 7. His current exercise regime is performing stretching exercises occassionallyand walking short distances.   Last UDS was performed on 08/27/2016, it was consistent.   [name]  hadLeft MBB Radiofrequency with Dr. Dossie Arbour on 04/01/2016, with relief noted.    Pain Inventory Average Pain 6 Pain Right Now 7 My pain is sharp, dull, stabbing and aching  In the last 24 hours, has pain interfered with the following? General activity 8 Relation with others 9 Enjoyment of life 9 What TIME of day is your pain at its worst? morning evening and night Sleep (in general) Fair  Pain is worse with: walking, bending, sitting, inactivity and standing Pain improves with: rest and medication Relief from Meds: 5  Mobility use a cane how many minutes can you walk? 5 ability to climb steps?  yes do you drive?  yes  Function disabled: date disabled . I need assistance with the following:  dressing, bathing, meal prep, household duties and shopping  Neuro/Psych weakness trouble walking spasms depression  Prior Studies Any changes since last visit?  no  Physicians involved in your care Any changes since last visit?  no   Family History  Problem Relation Age of Onset  . Hypertension Father    Social History   Social History  . Marital status: Married    Spouse name: N/A  . Number of children: N/A  . Years of education: N/A   Social History Main Topics  . Smoking status: Never Smoker  . Smokeless tobacco: Never Used  . Alcohol use No  . Drug use: No  . Sexual  activity: Not Asked   Other Topics Concern  . None   Social History Narrative  . None   Past Surgical History:  Procedure Laterality Date  . BACK SURGERY     lower back surgery x 2   Past Medical History:  Diagnosis Date  . Arthritis    "some in my back"  . GERD (gastroesophageal reflux disease)   . Headache(784.0)    BP 116/77   Pulse 75   SpO2 95%   Opioid Risk Score:   Fall Risk Score:  `1  Depression screen PHQ 2/9  Depression screen [facility] 2/9 09/23/2016 07/27/2016 05/05/2016 04/01/2016 02/03/2016 01/30/2016 01/01/2016  Decreased Interest 1 1 0 - 0 0 0  Down, Depressed, Hopeless 1 1 0 0 0 0 0  PHQ - 2 Score 2 2 0 0 0 0 0  Altered sleeping - - - - - - -  Tired, decreased energy - - - - - - -  Change in appetite - - - - - - -  Feeling bad or failure about yourself  - - - - - - -  Trouble concentrating - - - - - - -  Moving slowly or fidgety/restless - - - - - - -  Suicidal thoughts - - - - - - -  PHQ-9 Score - - - - - - -  Difficult doing work/chores - - - - - - -   Review of  Systems  HENT: Negative.   Eyes: Negative.   Respiratory: Negative.   Cardiovascular: Negative.   Gastrointestinal: Negative.   Endocrine: Negative.   Genitourinary: Negative.   Musculoskeletal: Positive for gait problem.       Spasms  Skin: Negative.   Allergic/Immunologic: Negative.   Neurological: Positive for weakness.  Hematological: Negative.   Psychiatric/Behavioral: Positive for dysphoric mood.  All other systems reviewed and are negative.      Objective:   Physical Exam  Constitutional: He is oriented to person, place, and time. He appears well-developed and well-nourished.  HENT:  Head: Normocephalic and atraumatic.  Neck: Normal range of motion. Neck supple.  Cardiovascular: Normal rate and regular rhythm.   Pulmonary/Chest: Effort normal and breath sounds normal.  Musculoskeletal:  Normal Muscle Bulk and Muscle Testing Reveals: Upper Extremities: Full ROM and Muscle  Strength 5/5 Lumbar Hypersensitivity Lower Extremities: Right: Decreased ROM and Muscle Strength 4/5 Right: Lower Extremity Flexion Produces Pain into Lumbar Left: Full ROM and Muscle Strength 5/5 Arises from Table slowly using straight cane for support Antalgic Gait  Neurological: He is alert and oriented to person, place, and time.  Skin: Skin is warm and dry.  Psychiatric: He has a normal mood and affect.  Nursing note and vitals reviewed.         Assessment & Plan:  1. 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 He has lumbar degenerative disc disease. Continue Gabapentin : 09/23/2016 Refilled: MS Contin30 mg one tablet every 12 hours #60 and Oxycodone 10 mg one tablet every 6 hours as needed for pain #120. We will continue the opioid monitoring program, this consists of regular clinic visits, examinations, urine drug screen, pill counts as well as use of [place] Controlled Substance reporting System. S/PLeft-sided Lumbar Facet Radiofrequency Ablation under fluoroscopic guidance with IV sedation on November 8,2017 withDr. Consuela Mimes, with good relief noted. 2. Sacroiliac Dysfunction: Continue with current medication, heat and exercise regime. 09/23/2016  3. Muscle Spasm: No complaints today.Continue to monitor.09/23/2016  20 minutes of face to face patient care time was spent during this visit. All questions were encouraged and answered.   F/U in 1 month.

## 2016-09-23 NOTE — Telephone Encounter (Signed)
On 09/23/2016 the  Samuel [name] was reviewed no conflict was seen on the [name] with multiple prescribers. Mr. [name] has a signed narcotic contract with our office. If there were any discrepancies this would have been reported to his physician.

## 2016-10-28 ENCOUNTER — Encounter: Payer: Self-pay | Admitting: Registered Nurse

## 2016-10-28 ENCOUNTER — Encounter: Payer: PPO | Attending: Physical Medicine & Rehabilitation | Admitting: Registered Nurse

## 2016-10-28 VITALS — BP 120/77 | HR 62

## 2016-10-28 DIAGNOSIS — G894 Chronic pain syndrome: Secondary | ICD-10-CM | POA: Diagnosis not present

## 2016-10-28 DIAGNOSIS — M961 Postlaminectomy syndrome, not elsewhere classified: Secondary | ICD-10-CM

## 2016-10-28 DIAGNOSIS — Z79899 Other long term (current) drug therapy: Secondary | ICD-10-CM | POA: Diagnosis not present

## 2016-10-28 DIAGNOSIS — M5416 Radiculopathy, lumbar region: Secondary | ICD-10-CM

## 2016-10-28 DIAGNOSIS — G8929 Other chronic pain: Secondary | ICD-10-CM | POA: Diagnosis not present

## 2016-10-28 DIAGNOSIS — G8928 Other chronic postprocedural pain: Secondary | ICD-10-CM | POA: Diagnosis not present

## 2016-10-28 DIAGNOSIS — Z981 Arthrodesis status: Secondary | ICD-10-CM | POA: Diagnosis not present

## 2016-10-28 DIAGNOSIS — M79605 Pain in left leg: Secondary | ICD-10-CM | POA: Diagnosis not present

## 2016-10-28 DIAGNOSIS — M79604 Pain in right leg: Secondary | ICD-10-CM | POA: Diagnosis not present

## 2016-10-28 DIAGNOSIS — M545 Low back pain: Secondary | ICD-10-CM | POA: Diagnosis not present

## 2016-10-28 DIAGNOSIS — Z5181 Encounter for therapeutic drug level monitoring: Secondary | ICD-10-CM | POA: Diagnosis not present

## 2016-10-28 MED ORDER — MORPHINE SULFATE ER 30 MG PO TBCR: 30.0000 mg | EXTENDED_RELEASE_TABLET | Freq: Two times a day (BID) | ORAL | 0 refills | Status: DC

## 2016-10-28 MED ORDER — OXYCODONE HCL 10 MG PO TABS: 10.0000 mg | ORAL_TABLET | Freq: Four times a day (QID) | ORAL | 0 refills | Status: DC | PRN

## 2016-10-28 MED ORDER — MORPHINE SULFATE 15 MG PO TABS: 15.0000 mg | ORAL_TABLET | Freq: Four times a day (QID) | ORAL | 0 refills | Status: DC | PRN

## 2016-10-28 NOTE — Progress Notes (Signed)
Subjective:    Patient ID: Samuel [name], male    DOB: 11/24/1975, 41 y.o.   MRN: [id number]  HPI:Samuel [name] is a 41year old male who returns for follow up appointmentfor chronic pain and medication refill. He states his pain is located in his lower back radiating into his bilateral lower extremities posteriorly. He rates his pain 7. His current exercise regime is performing stretching exercises occassionallyand walking short distances.   Last UDS was performed on 08/27/2016, it was consistent.   Samuel [name]  hadLeft MBB Radiofrequency with Dr. Dossie Arbour on 04/01/2016, with relief noted.   Pain Inventory Average Pain 6 Pain Right Now 7 My pain is sharp, dull, stabbing and aching  In the last 24 hours, has pain interfered with the following? General activity 8 Relation with others 8 Enjoyment of life 8 What TIME of day is your pain at its worst? morning evening and night Sleep (in general) Fair  Pain is worse with: walking, bending, sitting and standing Pain improves with: rest and medication Relief from Meds: 5  Mobility use a cane how many minutes can you walk? 5 ability to climb steps?  yes do you drive?  yes  Function disabled: date disabled 2010 I need assistance with the following:  dressing, meal prep, household duties and shopping  Neuro/Psych weakness trouble walking spasms  Prior Studies Any changes since last visit?  no  Physicians involved in your care Any changes since last visit?  no   Family History  Problem Relation Age of Onset  . Hypertension Father    Social History   Social History  . Marital status: Married    Spouse name: N/A  . Number of children: N/A  . Years of education: N/A   Social History Main Topics  . Smoking status: Never Smoker  . Smokeless tobacco: Never Used  . Alcohol use No  . Drug use: No  . Sexual activity: Not Asked   Other Topics Concern  . None   Social History Narrative  . None   Past  Surgical History:  Procedure Laterality Date  . BACK SURGERY     lower back surgery x 2   Past Medical History:  Diagnosis Date  . Arthritis    "some in my back"  . GERD (gastroesophageal reflux disease)   . Headache(784.0)    BP 120/77   Pulse 62   SpO2 95%   Opioid Risk Score:   Fall Risk Score:  `1  Depression screen PHQ 2/9  Depression screen [facility] 2/9 10/28/2016 09/23/2016 07/27/2016 05/05/2016 04/01/2016 02/03/2016 01/30/2016  Decreased Interest 0 1 1 0 - 0 0  Down, Depressed, Hopeless 0 1 1 0 0 0 0  PHQ - 2 Score 0 2 2 0 0 0 0  Altered sleeping - - - - - - -  Tired, decreased energy - - - - - - -  Change in appetite - - - - - - -  Feeling bad or failure about yourself  - - - - - - -  Trouble concentrating - - - - - - -  Moving slowly or fidgety/restless - - - - - - -  Suicidal thoughts - - - - - - -  PHQ-9 Score - - - - - - -  Difficult doing work/chores - - - - - - -   Review of Systems  HENT: Negative.   Eyes: Negative.   Respiratory: Negative.   Cardiovascular: Negative.  Gastrointestinal: Negative.   Endocrine: Negative.   Genitourinary: Negative.   Musculoskeletal: Positive for joint swelling.       Spasms  Allergic/Immunologic: Negative.   Neurological: Positive for weakness.  Hematological: Negative.   Psychiatric/Behavioral: Negative.   All other systems reviewed and are negative.      Objective:   Physical Exam  Constitutional: He is oriented to person, place, and time. He appears well-developed and well-nourished.  HENT:  Head: Normocephalic and atraumatic.  Neck: Normal range of motion. Neck supple.  Cardiovascular: Normal rate and regular rhythm.   Pulmonary/Chest: Effort normal and breath sounds normal.  Musculoskeletal:  Normal Muscle Bulk and Muscle Testing Reveals: Upper Extremities: Full ROM and Muscle Strength 5/5 Lumbar Paraspinal Tenderness: L-3-L-5 Lower Extremities: Right: Decreased ROM and Muscle Strength 5/5 Right Lower Extremity  Flexion Produces Pain into Lumbar Left: Full ROM and Muscle Strength 5/5 Arises from Table Slowly using straight cane for support Antalgic Gait  Neurological: He is alert and oriented to person, place, and time.  Skin: Skin is warm and dry.  Psychiatric: He has a normal mood and affect.  Nursing note and vitals reviewed.         Assessment & Plan:  1. 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 He has lumbar degenerative disc disease. Continue Gabapentin : 10/28/2016 Refilled: MS Contin30 mg one tablet every 12 hours #60 and Oxycodone 10 mg one tablet every 6 hours as needed for pain #120. We will continue the opioid monitoring program, this consists of regular clinic visits, examinations, urine drug screen, pill counts as well as use of [place] Controlled Substance reporting System. S/PLeft-sided Lumbar Facet Radiofrequency Ablation under fluoroscopic guidance with IV sedation on November 8,2017 withDr. Consuela Mimes, with good relief noted. 2. Sacroiliac Dysfunction: Continue with current medication, heat and exercise regime. 10/28/2016 3. Muscle Spasm: No complaints today.Continue to monitor.10/28/2016  20 minutes of face to face patient care time was spent during this visit. All questions were encouraged and answered.   F/U in 1 month.

## 2016-11-03 ENCOUNTER — Other Ambulatory Visit: Payer: Self-pay

## 2016-11-03 MED ORDER — GABAPENTIN 300 MG PO CAPS: ORAL_CAPSULE | ORAL | 5 refills | Status: DC

## 2016-11-03 MED ORDER — IBUPROFEN 800 MG PO TABS: 800.0000 mg | ORAL_TABLET | Freq: Three times a day (TID) | ORAL | 5 refills | Status: DC | PRN

## 2016-11-23 ENCOUNTER — Encounter: Payer: PPO | Attending: Physical Medicine & Rehabilitation | Admitting: Registered Nurse

## 2016-11-23 ENCOUNTER — Encounter: Payer: Self-pay | Admitting: Registered Nurse

## 2016-11-23 VITALS — BP 130/76 | HR 73 | Resp 14

## 2016-11-23 DIAGNOSIS — G8929 Other chronic pain: Secondary | ICD-10-CM | POA: Diagnosis not present

## 2016-11-23 DIAGNOSIS — M961 Postlaminectomy syndrome, not elsewhere classified: Secondary | ICD-10-CM

## 2016-11-23 DIAGNOSIS — M79604 Pain in right leg: Secondary | ICD-10-CM | POA: Insufficient documentation

## 2016-11-23 DIAGNOSIS — Z981 Arthrodesis status: Secondary | ICD-10-CM | POA: Insufficient documentation

## 2016-11-23 DIAGNOSIS — G8928 Other chronic postprocedural pain: Secondary | ICD-10-CM | POA: Diagnosis not present

## 2016-11-23 DIAGNOSIS — Z5181 Encounter for therapeutic drug level monitoring: Secondary | ICD-10-CM | POA: Diagnosis not present

## 2016-11-23 DIAGNOSIS — G894 Chronic pain syndrome: Secondary | ICD-10-CM

## 2016-11-23 DIAGNOSIS — M545 Low back pain: Secondary | ICD-10-CM | POA: Insufficient documentation

## 2016-11-23 DIAGNOSIS — M5416 Radiculopathy, lumbar region: Secondary | ICD-10-CM

## 2016-11-23 DIAGNOSIS — Z79899 Other long term (current) drug therapy: Secondary | ICD-10-CM | POA: Diagnosis not present

## 2016-11-23 DIAGNOSIS — M79605 Pain in left leg: Secondary | ICD-10-CM | POA: Insufficient documentation

## 2016-11-23 MED ORDER — MORPHINE SULFATE ER 30 MG PO TBCR: 30.0000 mg | EXTENDED_RELEASE_TABLET | Freq: Two times a day (BID) | ORAL | 0 refills | Status: DC

## 2016-11-23 MED ORDER — OXYCODONE HCL 10 MG PO TABS: 10.0000 mg | ORAL_TABLET | Freq: Four times a day (QID) | ORAL | 0 refills | Status: DC | PRN

## 2016-11-23 NOTE — Progress Notes (Signed)
Subjective:    Patient ID: Samuel [name], male    DOB: 10/05/1975, 41 y.o.   MRN: [id number]  HPI: Samuel [name] is a 41year old male who returns for follow up appointmentfor chronic pain and medication refill. He states his pain is located in his lower back radiating into his bilateral lower extremities laterally.He rates his pain 6. His current exercise regime is performing stretching exercises occassionallyand walking short distances.   Last UDS was performed on 08/27/2016, it was consistent.   Mr. Mierzwa hadLeft MBB Radiofrequency with Dr. Dossie Arbour on 04/01/2016, with relief noted.    Pain Inventory Average Pain 6 Pain Right Now 6 My pain is constant, sharp, dull, stabbing and aching  In the last 24 hours, has pain interfered with the following? General activity 8 Relation with others 9 Enjoyment of life 9 What TIME of day is your pain at its worst? morning, evening, night Sleep (in general) Fair  Pain is worse with: walking, bending, sitting, inactivity and standing Pain improves with: rest and medication Relief from Meds: 5  Mobility walk with assistance use a cane how many minutes can you walk? 5 ability to climb steps?  yes do you drive?  yes Do you have any goals in this area?  no  Function disabled: date disabled . I need assistance with the following:  dressing, bathing, meal prep, household duties and shopping Do you have any goals in this area?  no  Neuro/Psych weakness spasms depression  Prior Studies Any changes since last visit?  no  Physicians involved in your care Any changes since last visit?  no   Family History  Problem Relation Age of Onset  . Hypertension Father    Social History   Social History  . Marital status: Married    Spouse name: N/A  . Number of children: N/A  . Years of education: N/A   Social History Main Topics  . Smoking status: Never Smoker  . Smokeless tobacco: Never Used  . Alcohol use No  .  Drug use: No  . Sexual activity: Not Asked   Other Topics Concern  . None   Social History Narrative  . None   Past Surgical History:  Procedure Laterality Date  . BACK SURGERY     lower back surgery x 2   Past Medical History:  Diagnosis Date  . Arthritis    "some in my back"  . GERD (gastroesophageal reflux disease)   . Headache(784.0)    BP 130/76 (BP Location: Right Arm, Patient Position: Sitting, Cuff Size: Large)   Pulse 73   Resp 14   SpO2 95%   Opioid Risk Score:   Fall Risk Score:  `1  Depression screen PHQ 2/9  Depression screen [facility] 2/9 10/28/2016 09/23/2016 07/27/2016 05/05/2016 04/01/2016 02/03/2016 01/30/2016  Decreased Interest 0 1 1 0 - 0 0  Down, Depressed, Hopeless 0 1 1 0 0 0 0  PHQ - 2 Score 0 2 2 0 0 0 0  Altered sleeping - - - - - - -  Tired, decreased energy - - - - - - -  Change in appetite - - - - - - -  Feeling bad or failure about yourself  - - - - - - -  Trouble concentrating - - - - - - -  Moving slowly or fidgety/restless - - - - - - -  Suicidal thoughts - - - - - - -  PHQ-9 Score - - - - - - -  Difficult doing work/chores - - - - - - -    Review of Systems  HENT: Negative.   Eyes: Negative.   Respiratory: Negative.   Cardiovascular: Negative.   Gastrointestinal: Negative.   Endocrine: Negative.   Musculoskeletal: Positive for back pain and gait problem.       Spasms   Skin: Negative.   Allergic/Immunologic: Negative.   Neurological: Positive for weakness.  Psychiatric/Behavioral: Positive for dysphoric mood.  All other systems reviewed and are negative.      Objective:   Physical Exam  Constitutional: He is oriented to person, place, and time. He appears well-developed and well-nourished.  HENT:  Head: Normocephalic and atraumatic.  Neck: Normal range of motion. Neck supple.  Cardiovascular: Normal rate and regular rhythm.   Pulmonary/Chest: Effort normal and breath sounds normal.  Musculoskeletal:  Normal Muscle Bulk and   Muscle Testing Reveals: Upper Extremities: Full ROM and Muscle Strength 5/5 Lumbar Hypersensitivity Lower Extremities: Right: Decreased ROM and Muscle Strength 5/5 Left: Full ROM and Muscle Strength 5/5 Right: Lower Extremity Flexion Produces Pain into Lumbar Arises from Table Slowly Antalgic Gait     Neurological: He is alert and oriented to person, place, and time.  Skin: Skin is warm and dry.  Psychiatric: He has a normal mood and affect.  Nursing note and vitals reviewed.         Assessment & Plan:  1. 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 He has lumbar degenerative disc disease. Continue Gabapentin : 11/23/2016 Refilled: MS Contin30 mg one tablet every 12 hours #60 and Oxycodone 10mg  one tablet every 6 hours as needed for pain #120. We will continue the opioid monitoring program, this consists of regular clinic visits, examinations, urine drug screen, pill counts as well as use of [place] Controlled Substance reporting System. S/PLeft-sided Lumbar Facet Radiofrequency Ablation under fluoroscopic guidance with IV sedation on November 8,2017 withDr. Consuela Mimes, with good relief noted. 2. Sacroiliac Dysfunction: Continue with current medication, heat and exercise regime. 11/23/2016 3. Muscle Spasm: No complaints today.Continue to monitor.11/23/2016  20 minutes of face to face patient care time was spent during this visit. All questions were encouraged and answered.    F/U in 1 month.

## 2016-11-28 LAB — 6-ACETYLMORPHINE,TOXASSURE ADD
6-ACETYLMORPHINE: NEGATIVE
6-acetylmorphine: NOT DETECTED ng/mg creat

## 2016-11-28 LAB — TOXASSURE SELECT,+ANTIDEPR,UR

## 2016-12-02 ENCOUNTER — Telehealth: Payer: Self-pay | Admitting: *Deleted

## 2016-12-02 NOTE — Telephone Encounter (Signed)
Urine drug screen for this encounter is consistent for prescribed medication 

## 2016-12-28 ENCOUNTER — Encounter: Payer: PPO | Attending: Physical Medicine & Rehabilitation | Admitting: Registered Nurse

## 2016-12-28 ENCOUNTER — Encounter: Payer: Self-pay | Admitting: Registered Nurse

## 2016-12-28 ENCOUNTER — Telehealth: Payer: Self-pay | Admitting: Registered Nurse

## 2016-12-28 ENCOUNTER — Ambulatory Visit
Admission: RE | Admit: 2016-12-28 | Discharge: 2016-12-28 | Disposition: A | Discharge status: Home / Self Care | Payer: PPO | Source: Ambulatory Visit | Attending: Registered Nurse | Admitting: Registered Nurse

## 2016-12-28 VITALS — BP 137/78 | HR 72

## 2016-12-28 DIAGNOSIS — M5416 Radiculopathy, lumbar region: Secondary | ICD-10-CM

## 2016-12-28 DIAGNOSIS — M79604 Pain in right leg: Secondary | ICD-10-CM | POA: Insufficient documentation

## 2016-12-28 DIAGNOSIS — M545 Low back pain: Secondary | ICD-10-CM | POA: Insufficient documentation

## 2016-12-28 DIAGNOSIS — Z981 Arthrodesis status: Secondary | ICD-10-CM | POA: Insufficient documentation

## 2016-12-28 DIAGNOSIS — G894 Chronic pain syndrome: Secondary | ICD-10-CM | POA: Diagnosis not present

## 2016-12-28 DIAGNOSIS — M79641 Pain in right hand: Secondary | ICD-10-CM

## 2016-12-28 DIAGNOSIS — M961 Postlaminectomy syndrome, not elsewhere classified: Secondary | ICD-10-CM | POA: Insufficient documentation

## 2016-12-28 DIAGNOSIS — G8929 Other chronic pain: Secondary | ICD-10-CM

## 2016-12-28 DIAGNOSIS — Z5181 Encounter for therapeutic drug level monitoring: Secondary | ICD-10-CM | POA: Diagnosis not present

## 2016-12-28 DIAGNOSIS — M25531 Pain in right wrist: Secondary | ICD-10-CM | POA: Diagnosis not present

## 2016-12-28 DIAGNOSIS — Z79899 Other long term (current) drug therapy: Secondary | ICD-10-CM

## 2016-12-28 DIAGNOSIS — G8928 Other chronic postprocedural pain: Secondary | ICD-10-CM | POA: Diagnosis not present

## 2016-12-28 DIAGNOSIS — M79605 Pain in left leg: Secondary | ICD-10-CM | POA: Diagnosis not present

## 2016-12-28 MED ORDER — MORPHINE SULFATE ER 30 MG PO TBCR
30.0000 mg | EXTENDED_RELEASE_TABLET | Freq: Two times a day (BID) | ORAL | 0 refills | Status: DC
Start: 2016-12-28 — End: 2017-01-26

## 2016-12-28 MED ORDER — OXYCODONE HCL 10 MG PO TABS: 10.0000 mg | ORAL_TABLET | Freq: Four times a day (QID) | ORAL | 0 refills | Status: DC | PRN

## 2016-12-28 NOTE — Telephone Encounter (Signed)
On 12/28/2016 the  [place] was reviewed no conflict was seen on the [place] with multiple prescribers. Samuel [name]  has a signed narcotic contract with our office. If there were any discrepancies this would have been reported to his physician.

## 2016-12-28 NOTE — Telephone Encounter (Signed)
Placed a call to Mr. Goley regarding X-rays, no answer. Results are negative. Left message to return the call.

## 2016-12-28 NOTE — Progress Notes (Signed)
Subjective:    Patient ID: Samuel [name], male    DOB: Aug 01, 1975, 41 y.o.   MRN: [id number]  HPI: [name] is a 41year old male who returns for follow up appointmentfor chronic pain and medication refill. He states his pain is located in his right wrist, right hand denies falling and  lower back pain radiating into his bilaterallower extremities laterally.He rates his pain 7. His current exercise regime is performing stretching exercises occassionallyand walking short distances.   Last UDS was performed on 11/23/2016, it was consistent.   Samuel [name] hadLeft MBB Radiofrequency with Dr. Dossie Arbour on 04/01/2016, with relief noted.    Pain Inventory Average Pain 7 Pain Right Now 7 My pain is sharp, dull, stabbing and aching  In the last 24 hours, has pain interfered with the following? General activity 8 Relation with others 9 Enjoyment of life 9 What TIME of day is your pain at its worst? morning, evening, night Sleep (in general) Fair  Pain is worse with: walking, bending, sitting, inactivity and standing Pain improves with: rest and medication Relief from Meds: 5  Mobility use a cane how many minutes can you walk? 5 ability to climb steps?  yes do you drive?  yes Do you have any goals in this area?  no  Function disabled: date disabled . I need assistance with the following:  dressing, bathing, meal prep, household duties and shopping  Neuro/Psych weakness trouble walking spasms  Prior Studies Any changes since last visit?  no  Physicians involved in your care Any changes since last visit?  no   Family History  Problem Relation Age of Onset  . Hypertension Father    Social History   Social History  . Marital status: Married    Spouse name: N/A  . Number of children: N/A  . Years of education: N/A   Social History Main Topics  . Smoking status: Never Smoker  . Smokeless tobacco: Never Used  . Alcohol use No  . Drug use: No  .  Sexual activity: Not Asked   Other Topics Concern  . None   Social History Narrative  . None   Past Surgical History:  Procedure Laterality Date  . BACK SURGERY     lower back surgery x 2   Past Medical History:  Diagnosis Date  . Arthritis    "some in my back"  . GERD (gastroesophageal reflux disease)   . Headache(784.0)    BP 137/78 (BP Location: Left Arm, Patient Position: Sitting, Cuff Size: Large)   Pulse 72   SpO2 97%   Opioid Risk Score:   Fall Risk Score:  `1  Depression screen PHQ 2/9  Depression screen [facility] 2/9 10/28/2016 09/23/2016 07/27/2016 05/05/2016 04/01/2016 02/03/2016 01/30/2016  Decreased Interest 0 1 1 0 - 0 0  Down, Depressed, Hopeless 0 1 1 0 0 0 0  PHQ - 2 Score 0 2 2 0 0 0 0  Altered sleeping - - - - - - -  Tired, decreased energy - - - - - - -  Change in appetite - - - - - - -  Feeling bad or failure about yourself  - - - - - - -  Trouble concentrating - - - - - - -  Moving slowly or fidgety/restless - - - - - - -  Suicidal thoughts - - - - - - -  PHQ-9 Score - - - - - - -  Difficult doing work/chores - - - - - - -  Review of Systems  HENT: Negative.   Eyes: Negative.   Respiratory: Negative.   Cardiovascular: Negative.   Endocrine: Negative.   Genitourinary: Negative.   Musculoskeletal: Positive for arthralgias, back pain, gait problem and myalgias.       Spasms   Skin: Negative.   Allergic/Immunologic: Negative.   Neurological: Positive for weakness.  Hematological: Negative.   Psychiatric/Behavioral: Negative.   All other systems reviewed and are negative.      Objective:   Physical Exam  Constitutional: He is oriented to person, place, and time. He appears well-developed and well-nourished.  HENT:  Head: Normocephalic and atraumatic.  Neck: Normal range of motion. Neck supple.  Cardiovascular: Normal rate and regular rhythm.   Pulmonary/Chest: Effort normal and breath sounds normal.  Musculoskeletal:  Normal Muscle Bulk and  Muscle Testing Reveals: Upper Extremities: Full ROM and Muscle Strength 5/5 Lumbar Hypersensitivity Lower Extremities: Decreased ROM and Muscle Strength 4/5 Bilateral Lower Extremities Flexion Produces Pain into Lumbar Arises from Table slowly    Neurological: He is alert and oriented to person, place, and time.  Skin: Skin is warm and dry.  Psychiatric: He has a normal mood and affect.  Nursing note and vitals reviewed.         Assessment & Plan:  1. 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 He has lumbar degenerative disc disease. Continue Gabapentin : 12/28/2016 Refilled: MS Contin30 mg one tablet every 12 hours #60 and Oxycodone 10mg  one tablet every 6 hours as needed for pain #120. We will continue the opioid monitoring program, this consists of regular clinic visits, examinations, urine drug screen, pill counts as well as use of [place] Controlled Substance reporting System. S/PLeft-sided Lumbar Facet Radiofrequency Ablation under fluoroscopic guidance with IV sedation on November 8,2017 withDr. Consuela Mimes, with good relief noted. 2. Sacroiliac Dysfunction: Continue with current medication, heat and exercise regime. 12/28/2016 3. Muscle Spasm: No complaints today.Continue to monitor.12/28/2016 4. Right Wrist/ Right Hand Pain: RX; X-ray   20 minutes of face to face patient care time was spent during this visit. All questions were encouraged and answered.    F/U in 1 month.

## 2016-12-29 ENCOUNTER — Telehealth: Payer: Self-pay | Admitting: Registered Nurse

## 2016-12-29 NOTE — Telephone Encounter (Signed)
Received a call from Mr. Mcniel, reviewed X-ray results. He verbalizes understanding.

## 2017-01-26 ENCOUNTER — Encounter: Payer: Self-pay | Admitting: Registered Nurse

## 2017-01-26 ENCOUNTER — Encounter: Payer: PPO | Attending: Physical Medicine & Rehabilitation | Admitting: Registered Nurse

## 2017-01-26 VITALS — BP 119/75 | HR 73

## 2017-01-26 DIAGNOSIS — G894 Chronic pain syndrome: Secondary | ICD-10-CM | POA: Diagnosis not present

## 2017-01-26 DIAGNOSIS — Z981 Arthrodesis status: Secondary | ICD-10-CM | POA: Diagnosis not present

## 2017-01-26 DIAGNOSIS — G8929 Other chronic pain: Secondary | ICD-10-CM | POA: Diagnosis not present

## 2017-01-26 DIAGNOSIS — M5416 Radiculopathy, lumbar region: Secondary | ICD-10-CM | POA: Diagnosis not present

## 2017-01-26 DIAGNOSIS — M79605 Pain in left leg: Secondary | ICD-10-CM | POA: Insufficient documentation

## 2017-01-26 DIAGNOSIS — Z79899 Other long term (current) drug therapy: Secondary | ICD-10-CM

## 2017-01-26 DIAGNOSIS — Z5181 Encounter for therapeutic drug level monitoring: Secondary | ICD-10-CM | POA: Diagnosis not present

## 2017-01-26 DIAGNOSIS — G8928 Other chronic postprocedural pain: Secondary | ICD-10-CM | POA: Insufficient documentation

## 2017-01-26 DIAGNOSIS — M79604 Pain in right leg: Secondary | ICD-10-CM | POA: Insufficient documentation

## 2017-01-26 DIAGNOSIS — M961 Postlaminectomy syndrome, not elsewhere classified: Secondary | ICD-10-CM | POA: Diagnosis not present

## 2017-01-26 DIAGNOSIS — M545 Low back pain: Secondary | ICD-10-CM | POA: Diagnosis not present

## 2017-01-26 MED ORDER — MORPHINE SULFATE ER 30 MG PO TBCR: 30.0000 mg | EXTENDED_RELEASE_TABLET | Freq: Two times a day (BID) | ORAL | 0 refills | Status: DC

## 2017-01-26 MED ORDER — OXYCODONE HCL 10 MG PO TABS: 10.0000 mg | ORAL_TABLET | Freq: Four times a day (QID) | ORAL | 0 refills | Status: DC | PRN

## 2017-01-26 NOTE — Progress Notes (Signed)
Subjective:    Patient ID: Samuel [name], male    DOB: 1976/01/05, 41 y.o.   MRN: [id number]  HPI: [name] is a 41year old male who returns for follow up appointmentfor chronic pain and medication refill. He states his pain is located in his lower back pain radiating into his bilateral hips and bilaterallower extremities laterally.He rates his pain 6.. His current exercise regime is performing stretching exercises occassionallyand walking short distances.   Last UDS was performed on 11/23/2016, it was consistent.   Samuel [name] hadLeft MBB Radiofrequency with Dr. Dossie Arbour on 04/01/2016, with relief noted.   Pain Inventory Average Pain 7 Pain Right Now 6 My pain is constant, sharp, dull and stabbing  In the last 24 hours, has pain interfered with the following? General activity 8 Relation with others 9 Enjoyment of life 9 What TIME of day is your pain at its worst? morning evening and night Sleep (in general) Fair  Pain is worse with: walking, bending, sitting and standing Pain improves with: rest and medication Relief from Meds: 5  Mobility use a cane how many minutes can you walk? 5 ability to climb steps?  yes do you drive?  yes  Function disabled: date disabled 2010 I need assistance with the following:  dressing, bathing, meal prep, household duties and shopping  Neuro/Psych weakness depression  Prior Studies Any changes since last visit?  no  Physicians involved in your care Any changes since last visit?  no   Family History  Problem Relation Age of Onset  . Hypertension Father    Social History   Social History  . Marital status: Married    Spouse name: N/A  . Number of children: N/A  . Years of education: N/A   Social History Main Topics  . Smoking status: Never Smoker  . Smokeless tobacco: Never Used  . Alcohol use No  . Drug use: No  . Sexual activity: Not Asked   Other Topics Concern  . None   Social History  Narrative  . None   Past Surgical History:  Procedure Laterality Date  . BACK SURGERY     lower back surgery x 2   Past Medical History:  Diagnosis Date  . Arthritis    "some in my back"  . GERD (gastroesophageal reflux disease)   . Headache(784.0)    BP 119/75   Pulse 73   SpO2 94%   Opioid Risk Score:  2 Fall Risk Score:  `1  Depression screen PHQ 2/9  Depression screen [facility] 2/9 01/26/2017 10/28/2016 09/23/2016 07/27/2016 05/05/2016 04/01/2016 02/03/2016  Decreased Interest 0 0 1 1 0 - 0  Down, Depressed, Hopeless 0 0 1 1 0 0 0  PHQ - 2 Score 0 0 2 2 0 0 0  Altered sleeping - - - - - - -  Tired, decreased energy - - - - - - -  Change in appetite - - - - - - -  Feeling bad or failure about yourself  - - - - - - -  Trouble concentrating - - - - - - -  Moving slowly or fidgety/restless - - - - - - -  Suicidal thoughts - - - - - - -  PHQ-9 Score - - - - - - -  Difficult doing work/chores - - - - - - -   Review of Systems  HENT: Negative.   Eyes: Negative.   Respiratory: Negative.   Cardiovascular: Negative.  Gastrointestinal: Negative.   Endocrine: Negative.   Genitourinary: Negative.   Musculoskeletal: Negative.   Skin: Negative.   Allergic/Immunologic: Negative.   Neurological: Positive for weakness.  Hematological: Negative.   Psychiatric/Behavioral: Positive for dysphoric mood.  All other systems reviewed and are negative.      Objective:   Physical Exam  Constitutional: He is oriented to person, place, and time. He appears well-developed and well-nourished.  HENT:  Head: Normocephalic and atraumatic.  Neck: Normal range of motion. Neck supple.  Cardiovascular: Normal rate and regular rhythm.   Pulmonary/Chest: Effort normal and breath sounds normal.  Musculoskeletal:  Normal Muscle Bulk and Muscle Testing Reveals: Upper extremities: Full ROM and Muscle Strength 5/5 Lumbar Hypersensitivity Lower Extremities: Decreased ROM and Muscle Strength 5/5 Bilateral  Lower Extremities Flexion Produces Pain into Lumbar and Bilateral Hips Arises from table Slowly Antalgic gait  Neurological: He is alert and oriented to person, place, and time.  Skin: Skin is warm and dry.  Psychiatric: He has a normal mood and affect.  Nursing note and vitals reviewed.         Assessment & Plan:  1. 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 He has lumbar degenerative disc disease. Continue Gabapentin : 01/26/2017 Refilled: MS Contin30 mg one tablet every 12 hours #60 and Oxycodone 10mg  one tablet every 6 hours as needed for pain #120. We will continue the opioid monitoring program, this consists of regular clinic visits, examinations, urine drug screen, pill counts as well as use of [place] Controlled Substance reporting System. S/PLeft-sided Lumbar Facet Radiofrequency Ablation under fluoroscopic guidance with IV sedation on November 8,2017 withDr. Consuela Mimes, with good relief noted. 2. Sacroiliac Dysfunction: Continue with current medication, heat and exercise regime. 01/26/2017 3. Muscle Spasm: No complaints today.Continue to monitor.01/26/2017  20 mminutes of face to face patient care time was spent during this visit. All questions were encouraged and answered.   F/U in 1 month.

## 2017-02-23 ENCOUNTER — Ambulatory Visit (HOSPITAL_BASED_OUTPATIENT_CLINIC_OR_DEPARTMENT_OTHER): Payer: PPO | Admitting: Physical Medicine & Rehabilitation

## 2017-02-23 ENCOUNTER — Encounter: Payer: PPO | Attending: Physical Medicine & Rehabilitation

## 2017-02-23 ENCOUNTER — Encounter: Payer: Self-pay | Admitting: Physical Medicine & Rehabilitation

## 2017-02-23 VITALS — BP 129/72 | HR 79

## 2017-02-23 DIAGNOSIS — G8928 Other chronic postprocedural pain: Secondary | ICD-10-CM | POA: Diagnosis not present

## 2017-02-23 DIAGNOSIS — F1190 Opioid use, unspecified, uncomplicated: Secondary | ICD-10-CM

## 2017-02-23 DIAGNOSIS — M545 Low back pain: Secondary | ICD-10-CM | POA: Insufficient documentation

## 2017-02-23 DIAGNOSIS — G8929 Other chronic pain: Secondary | ICD-10-CM | POA: Diagnosis not present

## 2017-02-23 DIAGNOSIS — Z981 Arthrodesis status: Secondary | ICD-10-CM | POA: Insufficient documentation

## 2017-02-23 DIAGNOSIS — M79604 Pain in right leg: Secondary | ICD-10-CM | POA: Insufficient documentation

## 2017-02-23 DIAGNOSIS — G894 Chronic pain syndrome: Secondary | ICD-10-CM

## 2017-02-23 DIAGNOSIS — M79605 Pain in left leg: Secondary | ICD-10-CM | POA: Diagnosis not present

## 2017-02-23 DIAGNOSIS — M961 Postlaminectomy syndrome, not elsewhere classified: Secondary | ICD-10-CM | POA: Insufficient documentation

## 2017-02-23 DIAGNOSIS — M5416 Radiculopathy, lumbar region: Secondary | ICD-10-CM | POA: Diagnosis not present

## 2017-02-23 MED ORDER — OXYCODONE HCL 10 MG PO TABS: 10.0000 mg | ORAL_TABLET | Freq: Four times a day (QID) | ORAL | 0 refills | Status: DC | PRN

## 2017-02-23 MED ORDER — MORPHINE SULFATE ER 30 MG PO TBCR: 30.0000 mg | EXTENDED_RELEASE_TABLET | Freq: Two times a day (BID) | ORAL | 0 refills | Status: DC

## 2017-02-23 NOTE — Progress Notes (Signed)
Subjective:    Patient ID: Samuel [name], male    DOB: 1975-09-05, 41 y.o.   MRN: [id number]  HPI Discussed PMP aware scores, Discussed that his opioid over dose risk is about 10 times to the normal population. His narcotic score puts him at the top, 20% in terms of prescribed narcotics in this state. He has had no side effects from his medications and states that while they do not relieve all of his pain,they do help take the edge off. He's had no significant problems with constipation.  Pain Inventory Average Pain 6 Pain Right Now 7 My pain is sharp, dull, stabbing and aching  In the last 24 hours, has pain interfered with the following? General activity 8 Relation with others 9 Enjoyment of life 9 What TIME of day is your pain at its worst? evening Sleep (in general) Fair  Pain is worse with: walking, bending, sitting and standing Pain improves with: rest and medication Relief from Meds: 5  Mobility use a cane ability to climb steps?  yes do you drive?  yes  Function disabled: date disabled 2010 I need assistance with the following:  dressing, bathing, meal prep, household duties and shopping  Neuro/Psych weakness trouble walking depression  Prior Studies Any changes since last visit?  no  Physicians involved in your care Any changes since last visit?  no   Family History  Problem Relation Age of Onset  . Hypertension Father    Social History   Social History  . Marital status: Married    Spouse name: N/A  . Number of children: N/A  . Years of education: N/A   Social History Main Topics  . Smoking status: Never Smoker  . Smokeless tobacco: Never Used  . Alcohol use No  . Drug use: No  . Sexual activity: Not Asked   Other Topics Concern  . None   Social History Narrative  . None   Past Surgical History:  Procedure Laterality Date  . BACK SURGERY     lower back surgery x 2   Past Medical History:  Diagnosis Date  . Arthritis    "some in my back"  . GERD (gastroesophageal reflux disease)   . Headache(784.0)    BP 129/72   Pulse 79   SpO2 96%   Opioid Risk Score:   Fall Risk Score:  `1  Depression screen PHQ 2/9  Depression screen [facility] 2/9 01/26/2017 10/28/2016 09/23/2016 07/27/2016 05/05/2016 04/01/2016 02/03/2016  Decreased Interest 1 0 1 1 0 - 0  Down, Depressed, Hopeless 1 0 1 1 0 0 0  PHQ - 2 Score 2 0 2 2 0 0 0  Altered sleeping - - - - - - -  Tired, decreased energy - - - - - - -  Change in appetite - - - - - - -  Feeling bad or failure about yourself  - - - - - - -  Trouble concentrating - - - - - - -  Moving slowly or fidgety/restless - - - - - - -  Suicidal thoughts - - - - - - -  PHQ-9 Score - - - - - - -  Difficult doing work/chores - - - - - - -     Review of Systems  Constitutional: Negative.   HENT: Negative.   Eyes: Negative.   Respiratory: Negative.   Cardiovascular: Negative.   Gastrointestinal: Negative.   Endocrine: Negative.   Genitourinary: Negative.   Musculoskeletal: Negative.  Skin: Negative.   Allergic/Immunologic: Negative.   Neurological: Negative.   Hematological: Negative.   Psychiatric/Behavioral: Negative.   All other systems reviewed and are negative.      Objective:   Physical Exam  Constitutional: He is oriented to person, place, and time. He appears well-developed.  Overweight  HENT:  Head: Normocephalic and atraumatic.  Eyes: Pupils are equal, round, and reactive to light. Conjunctivae and EOM are normal.  Neurological: He is alert and oriented to person, place, and time. He has normal strength. No sensory deficit. Gait normal.  Motor strength is 5/5 bilateral deltoid , biceps, triceps, grip, hip flexors, knee extensors, ankle dorsiflexion  Skin: Skin is warm and dry.  Psychiatric: He has a normal mood and affect.  Nursing note and vitals reviewed. . Skin shows chronic skin discoloration from heating pad on the lumbar paraspinal area          Assessment & Plan:   1. Lumbar postlaminectomy syndrome. He had L3-S1 fusion in 2015. He has been on chronic narcotic analgesics since 2011. His first lumbar surgery was in 2010, which was a L3-L4 fusion. Overall, he is functioning at a modified independent level. Has not worked is on disability. Continue MS Contin 30 mg every 12 hours, continue oxycodone 10 mg every 6 hours when necessary Continue gabapentin 300 mg every morning and 600 mg daily at bedtime  Integrative therapy may be an option for him, he would benefit from a combination of acupuncture and physical therapy. We discussed TENS unit. He has tried one in the past without much success. He will continue heating pad however, he's been cautioned against overusing this

## 2017-02-23 NOTE — Patient Instructions (Signed)

## 2017-03-26 ENCOUNTER — Encounter: Payer: Self-pay | Admitting: Registered Nurse

## 2017-03-26 ENCOUNTER — Telehealth: Payer: Self-pay | Admitting: Registered Nurse

## 2017-03-26 ENCOUNTER — Encounter: Payer: PPO | Attending: Physical Medicine & Rehabilitation | Admitting: Registered Nurse

## 2017-03-26 VITALS — BP 112/75 | HR 78

## 2017-03-26 DIAGNOSIS — Z981 Arthrodesis status: Secondary | ICD-10-CM | POA: Diagnosis not present

## 2017-03-26 DIAGNOSIS — Z5181 Encounter for therapeutic drug level monitoring: Secondary | ICD-10-CM

## 2017-03-26 DIAGNOSIS — M79605 Pain in left leg: Secondary | ICD-10-CM | POA: Insufficient documentation

## 2017-03-26 DIAGNOSIS — M5136 Other intervertebral disc degeneration, lumbar region: Secondary | ICD-10-CM | POA: Diagnosis not present

## 2017-03-26 DIAGNOSIS — G8929 Other chronic pain: Secondary | ICD-10-CM | POA: Diagnosis not present

## 2017-03-26 DIAGNOSIS — M545 Low back pain, unspecified: Secondary | ICD-10-CM

## 2017-03-26 DIAGNOSIS — M961 Postlaminectomy syndrome, not elsewhere classified: Secondary | ICD-10-CM | POA: Insufficient documentation

## 2017-03-26 DIAGNOSIS — G8928 Other chronic postprocedural pain: Secondary | ICD-10-CM | POA: Diagnosis not present

## 2017-03-26 DIAGNOSIS — G894 Chronic pain syndrome: Secondary | ICD-10-CM

## 2017-03-26 DIAGNOSIS — M79604 Pain in right leg: Secondary | ICD-10-CM | POA: Diagnosis not present

## 2017-03-26 DIAGNOSIS — Z79899 Other long term (current) drug therapy: Secondary | ICD-10-CM

## 2017-03-26 DIAGNOSIS — M62830 Muscle spasm of back: Secondary | ICD-10-CM | POA: Diagnosis not present

## 2017-03-26 DIAGNOSIS — M5416 Radiculopathy, lumbar region: Secondary | ICD-10-CM

## 2017-03-26 MED ORDER — IBUPROFEN 800 MG PO TABS: 800.0000 mg | ORAL_TABLET | Freq: Three times a day (TID) | ORAL | 5 refills | Status: DC | PRN

## 2017-03-26 MED ORDER — OXYCODONE HCL 10 MG PO TABS: 10.0000 mg | ORAL_TABLET | Freq: Four times a day (QID) | ORAL | 0 refills | Status: DC | PRN

## 2017-03-26 MED ORDER — METHYLPREDNISOLONE 4 MG PO TBPK: ORAL_TABLET | ORAL | 0 refills | Status: DC

## 2017-03-26 MED ORDER — GABAPENTIN 300 MG PO CAPS: ORAL_CAPSULE | ORAL | 5 refills | Status: DC

## 2017-03-26 MED ORDER — MORPHINE SULFATE ER 30 MG PO TBCR: 30.0000 mg | EXTENDED_RELEASE_TABLET | Freq: Two times a day (BID) | ORAL | 0 refills | Status: DC

## 2017-03-26 MED ORDER — CYCLOBENZAPRINE HCL 10 MG PO TABS: 10.0000 mg | ORAL_TABLET | Freq: Two times a day (BID) | ORAL | 2 refills | Status: DC | PRN

## 2017-03-26 NOTE — Patient Instructions (Signed)
Start Flexeril today and call office in a week to evaluate: If you decide on injection let us know

## 2017-03-26 NOTE — Progress Notes (Signed)
Subjective:    Patient ID: Samuel [name], male    DOB: Oct 23, 1975, 41 y.o.   MRN: [id number]  HPI: Mr. FLEET HIGHAM is a 41year old male who returns for follow up appointmentfor chronic pain and medication refill. He states his pain is located in his lower back pain radiating into his right hip, right groin  and bilaterallower extremities laterally. Also reports on Tuesday 03/23/2017 he woke up in increase intensity of pain, the pain intensifies with movement he denies falling. Today he is wearing his back brace. We will prescribe medrol dose pak, On 04/01/2016 he had a Therapeutic Medial Branch Facet Radiofrequency Ablation with Dr. Dossie Arbour. Mr. Giraud states he will call office if he has decided to have procedure again, will speak with [name] regarding the above. He verbalizes understanding. Also reports increase frequency of muscle spasms will prescribe Flexeril, instructed to call office in a week to evaluate medication regime he verbalizes understanding.   He rates his pain 8. His current exercise regime is walking short distances.   Mr. Beavers Morphine equivalent is 120.00 MME.  Last UDS was performed on 11/23/2016, it was consistent.   Mr. Boyajian hadLeft MBB Radiofrequency with Dr. Dossie Arbour on 04/01/2016, with relief noted.   Pain Inventory Average Pain 7 Pain Right Now 8 My pain is sharp, dull, stabbing and aching  In the last 24 hours, has pain interfered with the following? General activity 10 Relation with others 10 Enjoyment of life 10 What TIME of day is your pain at its worst? all Sleep (in general) Fair  Pain is worse with: walking, bending, sitting and standing Pain improves with: rest and medication Relief from Meds: 4  Mobility walk with assistance use a cane how many minutes can you walk? 1 ability to climb steps?  no do you drive?  no use a wheelchair transfers alone  Function disabled: date disabled 2010 I need assistance with the  following:  dressing, bathing, meal prep, household duties and shopping  Neuro/Psych weakness trouble walking spasms depression  Prior Studies Any changes since last visit?  no  Physicians involved in your care Any changes since last visit?  no   Family History  Problem Relation Age of Onset  . Hypertension Father    Social History   Social History  . Marital status: Married    Spouse name: N/A  . Number of children: N/A  . Years of education: N/A   Social History Main Topics  . Smoking status: Never Smoker  . Smokeless tobacco: Never Used  . Alcohol use No  . Drug use: No  . Sexual activity: Not Asked   Other Topics Concern  . None   Social History Narrative  . None   Past Surgical History:  Procedure Laterality Date  . BACK SURGERY     lower back surgery x 2   Past Medical History:  Diagnosis Date  . Arthritis    "some in my back"  . GERD (gastroesophageal reflux disease)   . Headache(784.0)    There were no vitals taken for this visit.  Opioid Risk Score:  2 Fall Risk Score:  `1  Depression screen PHQ 2/9  Depression screen [facility] 2/9 03/26/2017 01/26/2017 10/28/2016 09/23/2016 07/27/2016 05/05/2016 04/01/2016  Decreased Interest 1 1 0 1 1 0 -  Down, Depressed, Hopeless 1 1 0 1 1 0 0  PHQ - 2 Score 2 2 0 2 2 0 0  Altered sleeping - - - - - - -  Tired, decreased energy - - - - - - -  Change in appetite - - - - - - -  Feeling bad or failure about yourself  - - - - - - -  Trouble concentrating - - - - - - -  Moving slowly or fidgety/restless - - - - - - -  Suicidal thoughts - - - - - - -  PHQ-9 Score - - - - - - -  Difficult doing work/chores - - - - - - -   Review of Systems  HENT: Negative.   Eyes: Negative.   Respiratory: Negative.   Cardiovascular: Negative.   Gastrointestinal: Negative.   Endocrine: Negative.   Genitourinary: Negative.   Musculoskeletal: Negative.   Skin: Negative.   Allergic/Immunologic: Negative.   Neurological: Positive  for weakness.  Hematological: Negative.   Psychiatric/Behavioral: Positive for dysphoric mood.  All other systems reviewed and are negative.      Objective:   Physical Exam  Constitutional: He is oriented to person, place, and time. He appears well-developed and well-nourished.  HENT:  Head: Normocephalic and atraumatic.  Neck: Normal range of motion. Neck supple.  Cardiovascular: Normal rate and regular rhythm.   Pulmonary/Chest: Effort normal and breath sounds normal.  Musculoskeletal:  Normal Muscle Bulk and Muscle Testing Reveals: Upper extremities: Full ROM and Muscle Strength 5/5 Lumbar Hypersensitivity Lower Extremities: Decreased ROM and Muscle Strength 5/5 Bilateral Lower Extremities Flexion Produces Pain into Lumbar and Bilateral Hips Arises from table Slowly Antalgic gait  Neurological: He is alert and oriented to person, place, and time.  Skin: Skin is warm and dry.  Psychiatric: He has a normal mood and affect.  Nursing note and vitals reviewed.         Assessment & Plan:  1. Acute Exacerbation of Chronic Low Back Pain: RX: Medrol Dose Pak 2. Muscle Spasm: RX: Flexeril 3. 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 He has lumbar degenerative disc disease. Continue Gabapentin : 03/26/2017 Refilled: MS Contin30 mg one tablet every 12 hours #60 and Oxycodone 10mg  one tablet every 6 hours as needed for pain #120. We will continue the opioid monitoring program, this consists of regular clinic visits, examinations, urine drug screen, pill counts as well as use of [place] Controlled Substance reporting System. S/PLeft-sided Lumbar Facet Radiofrequency Ablation under fluoroscopic guidance with IV sedation on November 8,2017 withDr. Consuela Mimes, with good relief noted. 4. Sacroiliac Dysfunction: Continue with current medication, heat and exercise regime. 03/26/2017  30 mminutes of face to face patient care time was spent during this  visit. All questions were encouraged and answered.   F/U in 1 month.

## 2017-03-26 NOTE — Telephone Encounter (Signed)
On 03/26/2017 the  [name] was reviewed no conflict was seen on the [place] with multiple prescribers. Samuel [name] has a signed narcotic contract with our office. If there were any discrepancies this would have been reported to his physician.

## 2017-04-12 ENCOUNTER — Telehealth: Payer: Self-pay | Admitting: *Deleted

## 2017-04-12 MED ORDER — TIZANIDINE HCL 2 MG PO TABS: 2.0000 mg | ORAL_TABLET | Freq: Three times a day (TID) | ORAL | 2 refills | Status: DC | PRN

## 2017-04-12 NOTE — Telephone Encounter (Signed)
Samuel [name] called for Samuel [name] to report that the flexeril was working but it was also affecting his eyes making his vision blurred and is asking if there is another choice of muscle relaxer he can try.

## 2017-04-12 NOTE — Telephone Encounter (Signed)
Return Mr. Wollman call he reports blurred vision with Flexeril, he has discontinued the Flexeril last week. No more  Vision problems. Going to prescribe Tizanidine, instructed to call in a week to evaluate medication changes He verbalizes understanding.

## 2017-04-27 ENCOUNTER — Encounter: Payer: PPO | Attending: Physical Medicine & Rehabilitation | Admitting: Registered Nurse

## 2017-04-27 ENCOUNTER — Encounter: Payer: Self-pay | Admitting: Registered Nurse

## 2017-04-27 VITALS — BP 126/79 | HR 78 | Resp 14

## 2017-04-27 DIAGNOSIS — M961 Postlaminectomy syndrome, not elsewhere classified: Secondary | ICD-10-CM | POA: Diagnosis not present

## 2017-04-27 DIAGNOSIS — M79604 Pain in right leg: Secondary | ICD-10-CM | POA: Diagnosis not present

## 2017-04-27 DIAGNOSIS — G894 Chronic pain syndrome: Secondary | ICD-10-CM

## 2017-04-27 DIAGNOSIS — Z981 Arthrodesis status: Secondary | ICD-10-CM | POA: Diagnosis not present

## 2017-04-27 DIAGNOSIS — M62830 Muscle spasm of back: Secondary | ICD-10-CM | POA: Diagnosis not present

## 2017-04-27 DIAGNOSIS — G8928 Other chronic postprocedural pain: Secondary | ICD-10-CM | POA: Insufficient documentation

## 2017-04-27 DIAGNOSIS — M5136 Other intervertebral disc degeneration, lumbar region: Secondary | ICD-10-CM | POA: Diagnosis not present

## 2017-04-27 DIAGNOSIS — Z5181 Encounter for therapeutic drug level monitoring: Secondary | ICD-10-CM | POA: Diagnosis not present

## 2017-04-27 DIAGNOSIS — M5416 Radiculopathy, lumbar region: Secondary | ICD-10-CM

## 2017-04-27 DIAGNOSIS — M545 Low back pain: Secondary | ICD-10-CM | POA: Insufficient documentation

## 2017-04-27 DIAGNOSIS — M79605 Pain in left leg: Secondary | ICD-10-CM | POA: Diagnosis not present

## 2017-04-27 DIAGNOSIS — Z79899 Other long term (current) drug therapy: Secondary | ICD-10-CM | POA: Diagnosis not present

## 2017-04-27 MED ORDER — MORPHINE SULFATE ER 30 MG PO TBCR: 30.0000 mg | EXTENDED_RELEASE_TABLET | Freq: Two times a day (BID) | ORAL | 0 refills | Status: DC

## 2017-04-27 MED ORDER — OXYCODONE HCL 10 MG PO TABS: 10.0000 mg | ORAL_TABLET | Freq: Four times a day (QID) | ORAL | 0 refills | Status: DC | PRN

## 2017-04-27 NOTE — Progress Notes (Signed)
Subjective:    Patient ID: Samuel [name], male    DOB: 09-30-1975, 41 y.o.   MRN: [id number]  HPI: Samuel [name] is a 41year old male who returns for follow up appointmentfor chronic pain and medication refill. He states his pain is located in his lower back pain radiating into his  bilaterallower extremities.He rates his pain 6. His current exercise regime is walking short distances.   Samuel [name] Morphine equivalent is 120.00 MME.  Last UDS was performed on 11/23/2016, it was consistent.   Samuel [name] hadLeft MBB Radiofrequency with Dr. Dossie Arbour on 04/01/2016, with relief noted.   Pain Inventory Average Pain 6 Pain Right Now 6 My pain is sharp, burning, dull, stabbing and aching  In the last 24 hours, has pain interfered with the following? General activity 9 Relation with others 9 Enjoyment of life 9 What TIME of day is your pain at its worst? morning, evening, night Sleep (in general) Fair  Pain is worse with: walking, bending, sitting and standing Pain improves with: rest and medication Relief from Meds: 5  Mobility walk with assistance use a cane how many minutes can you walk? 5 ability to climb steps?  no do you drive?  no use a wheelchair transfers alone  Function disabled: date disabled 2010 I need assistance with the following:  dressing, bathing, meal prep, household duties and shopping  Neuro/Psych weakness trouble walking spasms depression  Prior Studies Any changes since last visit?  no  Physicians involved in your care Any changes since last visit?  no   Family History  Problem Relation Age of Onset  . Hypertension Father    Social History   Socioeconomic History  . Marital status: Married    Spouse name: None  . Number of children: None  . Years of education: None  . Highest education level: None  Social Needs  . Financial resource strain: None  . Food insecurity - worry: None  . Food insecurity - inability: None  .  Transportation needs - medical: None  . Transportation needs - non-medical: None  Occupational History  . None  Tobacco Use  . Smoking status: Never Smoker  . Smokeless tobacco: Never Used  Substance and Sexual Activity  . Alcohol use: No    Alcohol/week: 0.0 oz  . Drug use: No  . Sexual activity: None  Other Topics Concern  . None  Social History Narrative  . None   Past Surgical History:  Procedure Laterality Date  . BACK SURGERY     lower back surgery x 2   Past Medical History:  Diagnosis Date  . Arthritis    "some in my back"  . GERD (gastroesophageal reflux disease)   . Headache(784.0)    BP 126/79 (BP Location: Right Arm, Patient Position: Sitting, Cuff Size: Large)   Pulse 78   Resp (!) 78   SpO2 95%   Opioid Risk Score:  2 Fall Risk Score:  `1  Depression screen PHQ 2/9  Depression screen [facility] 2/9 03/26/2017 01/26/2017 10/28/2016 09/23/2016 07/27/2016 05/05/2016 04/01/2016  Decreased Interest 1 1 0 1 1 0 -  Down, Depressed, Hopeless 1 1 0 1 1 0 0  PHQ - 2 Score 2 2 0 2 2 0 0  Altered sleeping - - - - - - -  Tired, decreased energy - - - - - - -  Change in appetite - - - - - - -  Feeling bad or failure about yourself  - - - - - - -  Trouble concentrating - - - - - - -  Moving slowly or fidgety/restless - - - - - - -  Suicidal thoughts - - - - - - -  PHQ-9 Score - - - - - - -  Difficult doing work/chores - - - - - - -   Review of Systems  HENT: Negative.   Eyes: Negative.   Respiratory: Negative.   Cardiovascular: Negative.   Gastrointestinal: Negative.   Endocrine: Negative.   Genitourinary: Negative.   Musculoskeletal: Positive for back pain and gait problem.  Skin: Negative.   Allergic/Immunologic: Negative.   Neurological: Positive for weakness.  Hematological: Negative.   Psychiatric/Behavioral: Positive for dysphoric mood.  All other systems reviewed and are negative.      Objective:   Physical Exam  Constitutional: He is oriented to person,  place, and time. He appears well-developed and well-nourished.  HENT:  Head: Normocephalic and atraumatic.  Neck: Normal range of motion. Neck supple.  Cardiovascular: Normal rate and regular rhythm.  Pulmonary/Chest: Effort normal and breath sounds normal.  Musculoskeletal:  Normal Muscle Bulk and Muscle Testing Reveals: Upper Extremities: Full ROM and Muscle Strength 5/5 Lumbar Hypersensitivity Lower Extremities: Decreased ROM and Muscle Strength 5/5 Bilateral Lower Extremities Flexion Produces Pain into Lumbar  Arises from table Slowly Antalgic gait  Neurological: He is alert and oriented to person, place, and time.  Skin: Skin is warm and dry.  Psychiatric: He has a normal mood and affect.  Nursing note and vitals reviewed.         Assessment & Plan:  1.  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 He has lumbar degenerative disc disease. Continue Gabapentin : 04/27/2017 Refilled: MS Contin30 mg one tablet every 12 hours #60 and Oxycodone 10mg  one tablet every 6 hours as needed for pain #120. We will continue the opioid monitoring program, this consists of regular clinic visits, examinations, urine drug screen, pill counts as well as use of [place] Controlled Substance reporting System. S/PLeft-sided Lumbar Facet Radiofrequency Ablation under fluoroscopic guidance with IV sedation on November 8,2017 withDr. Consuela Mimes, with good relief noted. 2. Sacroiliac Dysfunction: Continue with current medication, heat and exercise regime. 03/26/2017 3. Muscle Spasm: Continue Tizanidine  20 mminutes of face to face patient care time was spent during this visit. All questions were encouraged and answered.   F/U in 1 month.

## 2017-05-20 ENCOUNTER — Encounter: Payer: Self-pay | Admitting: Registered Nurse

## 2017-05-20 ENCOUNTER — Encounter (HOSPITAL_BASED_OUTPATIENT_CLINIC_OR_DEPARTMENT_OTHER): Payer: PPO | Admitting: Registered Nurse

## 2017-05-20 VITALS — BP 122/78 | HR 74 | Resp 14

## 2017-05-20 DIAGNOSIS — Z79899 Other long term (current) drug therapy: Secondary | ICD-10-CM

## 2017-05-20 DIAGNOSIS — M62830 Muscle spasm of back: Secondary | ICD-10-CM | POA: Diagnosis not present

## 2017-05-20 DIAGNOSIS — Z5181 Encounter for therapeutic drug level monitoring: Secondary | ICD-10-CM | POA: Diagnosis not present

## 2017-05-20 DIAGNOSIS — M5416 Radiculopathy, lumbar region: Secondary | ICD-10-CM

## 2017-05-20 DIAGNOSIS — M961 Postlaminectomy syndrome, not elsewhere classified: Secondary | ICD-10-CM | POA: Diagnosis not present

## 2017-05-20 DIAGNOSIS — G894 Chronic pain syndrome: Secondary | ICD-10-CM

## 2017-05-20 DIAGNOSIS — M51369 Other intervertebral disc degeneration, lumbar region without mention of lumbar back pain or lower extremity pain: Secondary | ICD-10-CM

## 2017-05-20 DIAGNOSIS — G8928 Other chronic postprocedural pain: Secondary | ICD-10-CM | POA: Diagnosis not present

## 2017-05-20 DIAGNOSIS — M5136 Other intervertebral disc degeneration, lumbar region: Secondary | ICD-10-CM | POA: Diagnosis not present

## 2017-05-20 MED ORDER — MORPHINE SULFATE ER 30 MG PO TBCR: 30.0000 mg | EXTENDED_RELEASE_TABLET | Freq: Two times a day (BID) | ORAL | 0 refills | Status: DC

## 2017-05-20 MED ORDER — OXYCODONE HCL 10 MG PO TABS: 10.0000 mg | ORAL_TABLET | Freq: Four times a day (QID) | ORAL | 0 refills | Status: DC | PRN

## 2017-05-20 NOTE — Progress Notes (Signed)
Subjective:    Patient ID: Samuel [name], male    DOB: 1976-01-16, 41 y.o.   MRN: [id number]  HPI: [name] is a 41year old male who returns for follow up appointmentfor chronic pain and medication refill. He states his pain is located in his lower back pain radiating into his  bilaterallower extremities.He rates his pain 6. His current exercise regime is walking short distances.   [name] Morphine equivalent is 120.00 MME.  Last UDS was performed on 11/23/2016, it was consistent.   [name] hadLeft MBB Radiofrequency with Dr. Dossie Arbour on 04/01/2016, with relief noted.   Pain Inventory Average Pain 6 Pain Right Now 6 My pain is sharp, burning, dull, stabbing and aching  In the last 24 hours, has pain interfered with the following? General activity 9 Relation with others 6 Enjoyment of life 6 What TIME of day is your pain at its worst? morning, evening, night Sleep (in general) Fair  Pain is worse with: walking, bending, sitting and standing Pain improves with: rest and medication Relief from Meds: 5  Mobility walk with assistance use a cane how many minutes can you walk? 5 ability to climb steps?  yes do you drive?  yes use a wheelchair transfers alone  Function disabled: date disabled 2010 I need assistance with the following:  dressing, bathing, meal prep, household duties and shopping  Neuro/Psych weakness trouble walking spasms depression  Prior Studies Any changes since last visit?  no  Physicians involved in your care Any changes since last visit?  no   Family History  Problem Relation Age of Onset  . Hypertension Father    Social History   Socioeconomic History  . Marital status: Married    Spouse name: None  . Number of children: None  . Years of education: None  . Highest education level: None  Social Needs  . Financial resource strain: None  . Food insecurity - worry: None  . Food insecurity - inability: None  .  Transportation needs - medical: None  . Transportation needs - non-medical: None  Occupational History  . None  Tobacco Use  . Smoking status: Never Smoker  . Smokeless tobacco: Never Used  Substance and Sexual Activity  . Alcohol use: No    Alcohol/week: 0.0 oz  . Drug use: No  . Sexual activity: None  Other Topics Concern  . None  Social History Narrative  . None   Past Surgical History:  Procedure Laterality Date  . BACK SURGERY     lower back surgery x 2   Past Medical History:  Diagnosis Date  . Arthritis    "some in my back"  . GERD (gastroesophageal reflux disease)   . Headache(784.0)    There were no vitals taken for this visit.  Opioid Risk Score:  2 Fall Risk Score:  `1  Depression screen PHQ 2/9  Depression screen [facility] 2/9 03/26/2017 01/26/2017 10/28/2016 09/23/2016 07/27/2016 05/05/2016 04/01/2016  Decreased Interest 1 1 0 1 1 0 -  Down, Depressed, Hopeless 1 1 0 1 1 0 0  PHQ - 2 Score 2 2 0 2 2 0 0  Altered sleeping - - - - - - -  Tired, decreased energy - - - - - - -  Change in appetite - - - - - - -  Feeling bad or failure about yourself  - - - - - - -  Trouble concentrating - - - - - - -  Moving slowly or fidgety/restless - - - - - - -  Suicidal thoughts - - - - - - -  PHQ-9 Score - - - - - - -  Difficult doing work/chores - - - - - - -   Review of Systems  HENT: Negative.   Eyes: Negative.   Respiratory: Negative.   Cardiovascular: Negative.   Gastrointestinal: Negative.   Endocrine: Negative.   Genitourinary: Negative.   Musculoskeletal: Positive for back pain and gait problem.  Skin: Negative.   Allergic/Immunologic: Negative.   Neurological: Positive for weakness.  Hematological: Negative.   Psychiatric/Behavioral: Positive for dysphoric mood.  All other systems reviewed and are negative.      Objective:   Physical Exam  Constitutional: He is oriented to person, place, and time. He appears well-developed and well-nourished.  HENT:    Head: Normocephalic and atraumatic.  Neck: Normal range of motion. Neck supple.  Cardiovascular: Normal rate and regular rhythm.  Pulmonary/Chest: Effort normal and breath sounds normal.  Musculoskeletal:  Normal Muscle Bulk and Muscle Testing Reveals: Upper Extremities: Full ROM and Muscle Strength 5/5 Thoracic Paraspinal Tenderness: T-7-T-9 Lumbar Hypersensitivity Lower Extremities: Decreased ROM and Muscle Strength 5/5 Bilateral Lower Extremities Flexion Produces Pain into Lumbar  Arises from table Slowly Antalgic gait  Neurological: He is alert and oriented to person, place, and time.  Skin: Skin is warm and dry.  Psychiatric: He has a normal mood and affect.  Nursing note and vitals reviewed.         Assessment & Plan:  1.  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 He has lumbar degenerative disc disease. Continue Gabapentin : 05/20/2017 Refilled: MS Contin30 mg one tablet every 12 hours #60 and Oxycodone 10mg  one tablet every 6 hours as needed for pain #120. We will continue the opioid monitoring program, this consists of regular clinic visits, examinations, urine drug screen, pill counts as well as use of [place] Controlled Substance reporting System. S/PLeft-sided Lumbar Facet Radiofrequency Ablation under fluoroscopic guidance with IV sedation on November 8,2017 withDr. Consuela Mimes, with good relief noted. 2. Sacroiliac Dysfunction: Continue with current medication, heat and exercise regime. 05/20/2017 3. Muscle Spasm: Continue Tizanidine  20 mminutes of face to face patient care time was spent during this visit. All questions were encouraged and answered.   F/U in 1 month.

## 2017-06-15 ENCOUNTER — Encounter: Payer: Self-pay | Admitting: Registered Nurse

## 2017-06-15 ENCOUNTER — Encounter: Payer: PPO | Attending: Physical Medicine & Rehabilitation | Admitting: Registered Nurse

## 2017-06-15 VITALS — BP 121/83 | HR 79

## 2017-06-15 DIAGNOSIS — M79605 Pain in left leg: Secondary | ICD-10-CM | POA: Insufficient documentation

## 2017-06-15 DIAGNOSIS — Z981 Arthrodesis status: Secondary | ICD-10-CM | POA: Insufficient documentation

## 2017-06-15 DIAGNOSIS — M79604 Pain in right leg: Secondary | ICD-10-CM | POA: Insufficient documentation

## 2017-06-15 DIAGNOSIS — M5416 Radiculopathy, lumbar region: Secondary | ICD-10-CM

## 2017-06-15 DIAGNOSIS — M5136 Other intervertebral disc degeneration, lumbar region: Secondary | ICD-10-CM

## 2017-06-15 DIAGNOSIS — G894 Chronic pain syndrome: Secondary | ICD-10-CM

## 2017-06-15 DIAGNOSIS — Z79899 Other long term (current) drug therapy: Secondary | ICD-10-CM

## 2017-06-15 DIAGNOSIS — M545 Low back pain: Secondary | ICD-10-CM | POA: Insufficient documentation

## 2017-06-15 DIAGNOSIS — Z5181 Encounter for therapeutic drug level monitoring: Secondary | ICD-10-CM | POA: Diagnosis not present

## 2017-06-15 DIAGNOSIS — G8928 Other chronic postprocedural pain: Secondary | ICD-10-CM | POA: Diagnosis not present

## 2017-06-15 DIAGNOSIS — M961 Postlaminectomy syndrome, not elsewhere classified: Secondary | ICD-10-CM | POA: Diagnosis not present

## 2017-06-15 DIAGNOSIS — M62830 Muscle spasm of back: Secondary | ICD-10-CM | POA: Diagnosis not present

## 2017-06-15 MED ORDER — OXYCODONE HCL 10 MG PO TABS: 10.0000 mg | ORAL_TABLET | Freq: Four times a day (QID) | ORAL | 0 refills | Status: DC | PRN

## 2017-06-15 MED ORDER — MORPHINE SULFATE ER 30 MG PO TBCR: 30.0000 mg | EXTENDED_RELEASE_TABLET | Freq: Two times a day (BID) | ORAL | 0 refills | Status: DC

## 2017-06-15 NOTE — Progress Notes (Signed)
Subjective:    Patient ID: Samuel [name], male    DOB: 09-24-1975, 42 y.o.   MRN: [id number]  HPI: [name] is a 42year old male who returns for follow up appointmentfor chronic pain and medication refill. He states his pain is located in his lower back radiating into his bilateral lower extremities. He rates his pain 7. His current exercise regime is walking short distances.   [name] Morphine equivalent is 124.00 MME.  Last UDS was performed on 11/23/2016, it was consistent.   [name] hadLeft MBB Radiofrequency with Dr. Dossie Arbour on 04/01/2016, with relief noted.   Pain Inventory Average Pain 6 Pain Right Now 7 My pain is sharp, burning, dull, stabbing and aching  In the last 24 hours, has pain interfered with the following? General activity 9 Relation with others 9 Enjoyment of life 9 What TIME of day is your pain at its worst? morning, evening, night Sleep (in general) Fair  Pain is worse with: walking, bending, sitting and standing Pain improves with: rest and medication Relief from Meds: 5  Mobility walk with assistance use a cane how many minutes can you walk? 5 ability to climb steps?  yes do you drive?  yes use a wheelchair transfers alone  Function disabled: date disabled 2010 I need assistance with the following:  dressing, bathing, meal prep, household duties and shopping  Neuro/Psych weakness trouble walking spasms depression  Prior Studies Any changes since last visit?  no  Physicians involved in your care Any changes since last visit?  no   Family History  Problem Relation Age of Onset  . Hypertension Father    Social History   Socioeconomic History  . Marital status: Married    Spouse name: Not on file  . Number of children: Not on file  . Years of education: Not on file  . Highest education level: Not on file  Social Needs  . Financial resource strain: Not on file  . Food insecurity - worry: Not on file  .  Food insecurity - inability: Not on file  . Transportation needs - medical: Not on file  . Transportation needs - non-medical: Not on file  Occupational History  . Not on file  Tobacco Use  . Smoking status: Never Smoker  . Smokeless tobacco: Never Used  Substance and Sexual Activity  . Alcohol use: No    Alcohol/week: 0.0 oz  . Drug use: No  . Sexual activity: Not on file  Other Topics Concern  . Not on file  Social History Narrative  . Not on file   Past Surgical History:  Procedure Laterality Date  . BACK SURGERY     lower back surgery x 2   Past Medical History:  Diagnosis Date  . Arthritis    "some in my back"  . GERD (gastroesophageal reflux disease)   . Headache(784.0)    There were no vitals taken for this visit.  Opioid Risk Score:  2 Fall Risk Score:  `1  Depression screen PHQ 2/9  Depression screen [facility] 2/9 03/26/2017 01/26/2017 10/28/2016 09/23/2016 07/27/2016 05/05/2016 04/01/2016  Decreased Interest 1 1 0 1 1 0 -  Down, Depressed, Hopeless 1 1 0 1 1 0 0  PHQ - 2 Score 2 2 0 2 2 0 0  Altered sleeping - - - - - - -  Tired, decreased energy - - - - - - -  Change in appetite - - - - - - -  Feeling bad or failure about yourself  - - - - - - -  Trouble concentrating - - - - - - -  Moving slowly or fidgety/restless - - - - - - -  Suicidal thoughts - - - - - - -  PHQ-9 Score - - - - - - -  Difficult doing work/chores - - - - - - -   Review of Systems  HENT: Negative.   Eyes: Negative.   Respiratory: Negative.   Cardiovascular: Negative.   Gastrointestinal: Negative.   Endocrine: Negative.   Genitourinary: Negative.   Musculoskeletal: Positive for back pain and gait problem.  Skin: Negative.   Allergic/Immunologic: Negative.   Neurological: Positive for weakness.  Hematological: Negative.   Psychiatric/Behavioral: Positive for dysphoric mood.  All other systems reviewed and are negative.      Objective:   Physical Exam  Constitutional: He is oriented  to person, place, and time. He appears well-developed and well-nourished.  HENT:  Head: Normocephalic and atraumatic.  Neck: Normal range of motion. Neck supple.  Cardiovascular: Normal rate and regular rhythm.  Pulmonary/Chest: Effort normal and breath sounds normal.  Musculoskeletal:  Normal Muscle Bulk and Muscle Testing Reveals: Upper Extremities: Full ROM and Muscle Strength 5/5 Lumbar Hypersensitivity Lower Extremities: Decreased ROM and Muscle Strength 5/5 Bilateral Lower Extremities Flexion Produces Pain into Lumbar  Arises from table Slowly Antalgic Gait  Neurological: He is alert and oriented to person, place, and time.  Skin: Skin is warm and dry.  Psychiatric: He has a normal mood and affect.  Nursing note and vitals reviewed.         Assessment & Plan:  1.  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 He has lumbar degenerative disc disease. Continue Gabapentin. 06/15/2017 Refilled: MS Contin30 mg one tablet every 12 hours #60 and Oxycodone 10mg  one tablet every 6 hours as needed for pain #120. We will continue the opioid monitoring program, this consists of regular clinic visits, examinations, urine drug screen, pill counts as well as use of [place] Controlled Substance reporting System. S/PLeft-sided Lumbar Facet Radiofrequency Ablation under fluoroscopic guidance with IV sedation on November 8,2017 withDr. Consuela Mimes, with good relief noted. 2. Sacroiliac Dysfunction: Continue with current medication, heat and exercise regime. 06/15/2017 3. Muscle Spasm: Continue Tizanidine. 06/15/2017  20 mminutes of face to face patient care time was spent during this visit. All questions were encouraged and answered.   F/U in 1 month.

## 2017-07-20 ENCOUNTER — Encounter: Payer: PPO | Attending: Physical Medicine & Rehabilitation | Admitting: Registered Nurse

## 2017-07-20 ENCOUNTER — Encounter: Payer: Self-pay | Admitting: Registered Nurse

## 2017-07-20 VITALS — BP 118/76 | HR 78

## 2017-07-20 DIAGNOSIS — Z5181 Encounter for therapeutic drug level monitoring: Secondary | ICD-10-CM | POA: Diagnosis not present

## 2017-07-20 DIAGNOSIS — G894 Chronic pain syndrome: Secondary | ICD-10-CM

## 2017-07-20 DIAGNOSIS — Z79899 Other long term (current) drug therapy: Secondary | ICD-10-CM

## 2017-07-20 DIAGNOSIS — M545 Low back pain: Secondary | ICD-10-CM | POA: Diagnosis not present

## 2017-07-20 DIAGNOSIS — Z981 Arthrodesis status: Secondary | ICD-10-CM | POA: Diagnosis not present

## 2017-07-20 DIAGNOSIS — M62830 Muscle spasm of back: Secondary | ICD-10-CM

## 2017-07-20 DIAGNOSIS — M79604 Pain in right leg: Secondary | ICD-10-CM | POA: Diagnosis not present

## 2017-07-20 DIAGNOSIS — M79605 Pain in left leg: Secondary | ICD-10-CM | POA: Diagnosis not present

## 2017-07-20 DIAGNOSIS — G8928 Other chronic postprocedural pain: Secondary | ICD-10-CM | POA: Diagnosis not present

## 2017-07-20 DIAGNOSIS — M5416 Radiculopathy, lumbar region: Secondary | ICD-10-CM | POA: Diagnosis not present

## 2017-07-20 DIAGNOSIS — M5136 Other intervertebral disc degeneration, lumbar region: Secondary | ICD-10-CM

## 2017-07-20 DIAGNOSIS — M961 Postlaminectomy syndrome, not elsewhere classified: Secondary | ICD-10-CM

## 2017-07-20 MED ORDER — MORPHINE SULFATE ER 30 MG PO TBCR: 30.0000 mg | EXTENDED_RELEASE_TABLET | Freq: Two times a day (BID) | ORAL | 0 refills | Status: DC

## 2017-07-20 MED ORDER — OXYCODONE HCL 10 MG PO TABS: 10.0000 mg | ORAL_TABLET | Freq: Four times a day (QID) | ORAL | 0 refills | Status: DC | PRN

## 2017-07-20 NOTE — Progress Notes (Signed)
Subjective:    Patient ID: Samuel [name], male    DOB: 02/05/1976, 42 y.o.   MRN: [id number]  HPI: Samuel [name] is a 42year old male who returns for follow up appointmentfor chronic pain and medication refill. He states his pain is located in his lower back radiating into his lower extremities. He rates his pain 7. His current exercise regime is walking.  Samuel [name] is 128.00 MME.  Last UDS was performed on 11/23/2016, it was consistent. UDS Ordered Today  Samuel [name] hadLeft MBB Radiofrequency with Dr. Dossie Arbour on 04/01/2016, with relief noted.   Pain Inventory Average Pain 6 Pain Right Now 7 My pain is constant, sharp, dull, stabbing and aching  In the last 24 hours, has pain interfered with the following? General activity 9 Relation with others 9 Enjoyment of life 9 What TIME of day is your pain at its worst? morning, evening, night Sleep (in general) Fair  Pain is worse with: walking, bending, sitting and standing Pain improves with: rest and medication Relief from Meds: 5  Mobility walk with assistance use a cane how many minutes can you walk? 5 ability to climb steps?  yes do you drive?  yes use a wheelchair transfers alone  Function disabled: date disabled 2010 I need assistance with the following:  dressing, bathing, meal prep, household duties and shopping  Neuro/Psych weakness trouble walking spasms depression  Prior Studies Any changes since last visit?  no  Physicians involved in your care Any changes since last visit?  no   Family History  Problem Relation Age of Onset  . Hypertension Father    Social History   Socioeconomic History  . Marital status: Married    Spouse name: Not on file  . Number of children: Not on file  . Years of education: Not on file  . Highest education level: Not on file  Social Needs  . Financial resource strain: Not on file  . Food insecurity - worry: Not on file  . Food  insecurity - inability: Not on file  . Transportation needs - medical: Not on file  . Transportation needs - non-medical: Not on file  Occupational History  . Not on file  Tobacco Use  . Smoking status: Never Smoker  . Smokeless tobacco: Never Used  Substance and Sexual Activity  . Alcohol use: No    Alcohol/week: 0.0 oz  . Drug use: No  . Sexual activity: Not on file  Other Topics Concern  . Not on file  Social History Narrative  . Not on file   Past Surgical History:  Procedure Laterality Date  . BACK SURGERY     lower back surgery x 2   Past Medical History:  Diagnosis Date  . Arthritis    "some in my back"  . GERD (gastroesophageal reflux disease)   . Headache(784.0)    There were no vitals taken for this visit.  Opioid Risk Score:  2 Fall Risk Score:  `1  Depression screen PHQ 2/9  Depression screen [facility] 2/9 03/26/2017 01/26/2017 10/28/2016 09/23/2016 07/27/2016 05/05/2016 04/01/2016  Decreased Interest 1 1 0 1 1 0 -  Down, Depressed, Hopeless 1 1 0 1 1 0 0  PHQ - 2 Score 2 2 0 2 2 0 0  Altered sleeping - - - - - - -  Tired, decreased energy - - - - - - -  Change in appetite - - - - - - -  Feeling  bad or failure about yourself  - - - - - - -  Trouble concentrating - - - - - - -  Moving slowly or fidgety/restless - - - - - - -  Suicidal thoughts - - - - - - -  PHQ-9 Score - - - - - - -  Difficult doing work/chores - - - - - - -   Review of Systems  HENT: Negative.   Eyes: Negative.   Respiratory: Negative.   Cardiovascular: Negative.   Gastrointestinal: Negative.   Endocrine: Negative.   Genitourinary: Negative.   Musculoskeletal: Positive for arthralgias, back pain, gait problem and myalgias.  Skin: Negative.   Allergic/Immunologic: Negative.   Neurological: Positive for weakness.  Hematological: Negative.   Psychiatric/Behavioral: Positive for dysphoric mood.  All other systems reviewed and are negative.      Objective:   Physical Exam    Constitutional: He is oriented to person, place, and time. He appears well-developed and well-nourished.  HENT:  Head: Normocephalic and atraumatic.  Neck: Normal range of motion. Neck supple.  Cardiovascular: Normal rate and regular rhythm.  Pulmonary/Chest: Effort normal and breath sounds normal.  Musculoskeletal:  Normal Muscle Bulk and Muscle Testing Reveals: Upper Extremities:Full ROM and Muscle Strength 5/5 Lumbar Hypersensitivity Lower Extremities: Full  ROM and Muscle Strength 5/5 Bilateral Lower Extremities Flexion Produces Pain into Lumbar  Arises from table Slowly Antalgic Gait  Neurological: He is alert and oriented to person, place, and time.  Skin: Skin is warm and dry.  Psychiatric: He has a normal mood and affect.  Nursing note and vitals reviewed.         Assessment & Plan:  1.  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 He has lumbar degenerative disc disease. Continue Gabapentin. 07/20/2017 Refilled: MS Contin30 mg one tablet every 12 hours #60 and Oxycodone 10mg  one tablet every 6 hours as needed for pain #120. We will continue the opioid monitoring program, this consists of regular clinic visits, examinations, urine drug screen, pill counts as well as use of [place] Controlled Substance reporting System. S/PLeft-sided Lumbar Facet Radiofrequency Ablation under fluoroscopic guidance with IV sedation on November 8,2017 withDr. Consuela Mimes, with good relief noted. 2. Sacroiliac Dysfunction: Continue with current medication, heat and exercise regime. 07/20/2017 3. Muscle Spasm: Continue Tizanidine. 07/20/2017  20 mminutes of face to face patient care time was spent during this visit. All questions were encouraged and answered.   F/U in 1 month.

## 2017-07-25 LAB — 6-ACETYLMORPHINE,TOXASSURE ADD
6-ACETYLMORPHINE: NEGATIVE
6-acetylmorphine: NOT DETECTED ng/mg creat

## 2017-07-25 LAB — TOXASSURE SELECT,+ANTIDEPR,UR

## 2017-07-26 ENCOUNTER — Telehealth: Payer: Self-pay | Admitting: *Deleted

## 2017-07-26 NOTE — Telephone Encounter (Signed)
Urine drug screen for this encounter is consistent for prescribed medication 

## 2017-08-17 ENCOUNTER — Ambulatory Visit (HOSPITAL_BASED_OUTPATIENT_CLINIC_OR_DEPARTMENT_OTHER): Payer: PPO | Admitting: Physical Medicine & Rehabilitation

## 2017-08-17 ENCOUNTER — Encounter: Payer: Self-pay | Admitting: Physical Medicine & Rehabilitation

## 2017-08-17 ENCOUNTER — Encounter: Payer: PPO | Attending: Physical Medicine & Rehabilitation

## 2017-08-17 VITALS — BP 121/79 | HR 70

## 2017-08-17 DIAGNOSIS — M961 Postlaminectomy syndrome, not elsewhere classified: Secondary | ICD-10-CM

## 2017-08-17 DIAGNOSIS — M79604 Pain in right leg: Secondary | ICD-10-CM | POA: Diagnosis not present

## 2017-08-17 DIAGNOSIS — M5136 Other intervertebral disc degeneration, lumbar region: Secondary | ICD-10-CM

## 2017-08-17 DIAGNOSIS — M79605 Pain in left leg: Secondary | ICD-10-CM | POA: Insufficient documentation

## 2017-08-17 DIAGNOSIS — M51369 Other intervertebral disc degeneration, lumbar region without mention of lumbar back pain or lower extremity pain: Secondary | ICD-10-CM

## 2017-08-17 DIAGNOSIS — M545 Low back pain: Secondary | ICD-10-CM | POA: Diagnosis not present

## 2017-08-17 DIAGNOSIS — G8928 Other chronic postprocedural pain: Secondary | ICD-10-CM | POA: Insufficient documentation

## 2017-08-17 DIAGNOSIS — Z981 Arthrodesis status: Secondary | ICD-10-CM | POA: Insufficient documentation

## 2017-08-17 MED ORDER — OXYCODONE HCL 10 MG PO TABS: 10.0000 mg | ORAL_TABLET | Freq: Four times a day (QID) | ORAL | 0 refills | Status: DC | PRN

## 2017-08-17 MED ORDER — MORPHINE SULFATE ER 30 MG PO TBCR: 30.0000 mg | EXTENDED_RELEASE_TABLET | Freq: Two times a day (BID) | ORAL | 0 refills | Status: DC

## 2017-08-17 NOTE — Patient Instructions (Signed)
Please make appt to see Dr Consuela Mimes if your Left sided low back pain increases.  You may need repeat Radiofrequency

## 2017-08-17 NOTE — Progress Notes (Signed)
Subjective:    Patient ID: Samuel [name], male    DOB: [birth year], 42 y.o.   MRN: [id number]  HPI  42 year old male with primary complaint of low back pain Patient has had L3-4 fusion by Dr. Trenton Gammon in 2010.  Underwent L3 through S1 fusion January 2015 by Dr. [name].  He has been on chronic narcotic analgesics since 2011.  He started on hydrocodone but then was switched to morphine and oxycodone.  He has been on disability since 02/23/2009 He uses laxatives to keep his bowels regular Patient has had short-term relief of 50% with right sacroiliac injection 04/24/2014, second set on 07/02/2014 also provided a similar effect however attempted radiofrequency was not tolerated by the patient. Another RF was performed under sedation.  12/24/2015 Patient had S1 lateral branch as well as dorsal ramus L5 radiofrequency Gabapentin MSContin OxyIR Pain Inventory Average Pain 7 Pain Right Now 7 My pain is sharp, dull, stabbing and aching  In the last 24 hours, has pain interfered with the following? General activity 9 Relation with others 9 Enjoyment of life 9 What TIME of day is your pain at its worst? evening Sleep (in general) Fair  Pain is worse with: walking, bending, sitting and standing Pain improves with: rest and medication Relief from Meds: 5  Mobility walk without assistance walk with assistance use a cane ability to climb steps?  yes do you drive?  yes  Function disabled: date disabled 2000  Neuro/Psych depression  Prior Studies Any changes since last visit?  no  Physicians involved in your care Any changes since last visit?  no   Family History  Problem Relation Age of Onset  . Hypertension Father    Social History   Socioeconomic History  . Marital status: Married    Spouse name: Not on file  . Number of children: Not on file  . Years of education: Not on file  . Highest education level: Not on file  Occupational History  . Not on file  Social Needs    . Financial resource strain: Not on file  . Food insecurity:    Worry: Not on file    Inability: Not on file  . Transportation needs:    Medical: Not on file    Non-medical: Not on file  Tobacco Use  . Smoking status: Never Smoker  . Smokeless tobacco: Never Used  Substance and Sexual Activity  . Alcohol use: No    Alcohol/week: 0.0 oz  . Drug use: No  . Sexual activity: Not on file  Lifestyle  . Physical activity:    Days per week: Not on file    Minutes per session: Not on file  . Stress: Not on file  Relationships  . Social connections:    Talks on phone: Not on file    Gets together: Not on file    Attends religious service: Not on file    Active member of club or organization: Not on file    Attends meetings of clubs or organizations: Not on file    Relationship status: Not on file  Other Topics Concern  . Not on file  Social History Narrative  . Not on file   Past Surgical History:  Procedure Laterality Date  . BACK SURGERY     lower back surgery x 2   Past Medical History:  Diagnosis Date  . Arthritis    "some in my back"  . GERD (gastroesophageal reflux disease)   . Headache(784.0)  There were no vitals taken for this visit.  Opioid Risk Score:   Fall Risk Score:  `1  Depression screen PHQ 2/9  Depression screen [facility] 2/9 03/26/2017 01/26/2017 10/28/2016 09/23/2016 07/27/2016 05/05/2016 04/01/2016  Decreased Interest 1 1 0 1 1 0 -  Down, Depressed, Hopeless 1 1 0 1 1 0 0  PHQ - 2 Score 2 2 0 2 2 0 0  Altered sleeping - - - - - - -  Tired, decreased energy - - - - - - -  Change in appetite - - - - - - -  Feeling bad or failure about yourself  - - - - - - -  Trouble concentrating - - - - - - -  Moving slowly or fidgety/restless - - - - - - -  Suicidal thoughts - - - - - - -  PHQ-9 Score - - - - - - -  Difficult doing work/chores - - - - - - -     Review of Systems  Constitutional: Negative.   HENT: Negative.   Eyes: Negative.   Respiratory:  Negative.   Cardiovascular: Negative.   Gastrointestinal: Negative.   Endocrine: Negative.   Genitourinary: Negative.   Musculoskeletal: Positive for arthralgias, back pain, gait problem and myalgias.  Skin: Negative.   Allergic/Immunologic: Negative.   Hematological: Negative.   Psychiatric/Behavioral: Negative.   All other systems reviewed and are negative.      Objective:   Physical Exam  Constitutional: He is oriented to person, place, and time. He appears well-developed and well-nourished. No distress.  HENT:  Head: Normocephalic and atraumatic.  Eyes: Pupils are equal, round, and reactive to light. Conjunctivae and EOM are normal.  Neck: Normal range of motion.  Neurological: He is alert and oriented to person, place, and time.  Skin: He is not diaphoretic.  Psychiatric: He has a normal mood and affect.  Nursing note and vitals reviewed.  Lumbar spine has reduced range of motion with flexion extension lateral bending and rotation Ambulates without evidence of toe drag or knee instability He has tenderness palpation lumbosacral junction bilaterally. Motor strength is 5/5 bilateral hip flexion extensor ankle dorsiflexor       Assessment & Plan:  #1.  Lumbar postlaminectomy syndrome with chronic postoperative pain. Continue current pain medications  Indication for chronic opioid: Lumbar postlaminectomy syndrome Medication and dose: Morphine 30 mg twice daily, oxycodone 10 mg 4 times daily # pills per month: Morphine 60 tablets/month, oxycodone 120 tablets per  Last UDS date: 07/20/2017 Opioid Treatment Agreement signed (Y/N): Yes Opioid Treatment Agreement last reviewed with patient:   [facility] reviewed this encounter (include red flags):  08/17/17

## 2017-09-21 ENCOUNTER — Encounter: Payer: PPO | Attending: Physical Medicine & Rehabilitation | Admitting: Registered Nurse

## 2017-09-21 ENCOUNTER — Encounter: Payer: Self-pay | Admitting: Registered Nurse

## 2017-09-21 VITALS — BP 131/79 | HR 65 | Resp 14 | Ht 73.0 in | Wt 311.0 lb

## 2017-09-21 DIAGNOSIS — M79604 Pain in right leg: Secondary | ICD-10-CM | POA: Insufficient documentation

## 2017-09-21 DIAGNOSIS — Z5181 Encounter for therapeutic drug level monitoring: Secondary | ICD-10-CM | POA: Diagnosis not present

## 2017-09-21 DIAGNOSIS — Z79899 Other long term (current) drug therapy: Secondary | ICD-10-CM | POA: Diagnosis not present

## 2017-09-21 DIAGNOSIS — M79605 Pain in left leg: Secondary | ICD-10-CM | POA: Insufficient documentation

## 2017-09-21 DIAGNOSIS — G8928 Other chronic postprocedural pain: Secondary | ICD-10-CM | POA: Diagnosis not present

## 2017-09-21 DIAGNOSIS — M545 Low back pain: Secondary | ICD-10-CM | POA: Diagnosis not present

## 2017-09-21 DIAGNOSIS — M961 Postlaminectomy syndrome, not elsewhere classified: Secondary | ICD-10-CM | POA: Diagnosis not present

## 2017-09-21 DIAGNOSIS — M5136 Other intervertebral disc degeneration, lumbar region: Secondary | ICD-10-CM

## 2017-09-21 DIAGNOSIS — G894 Chronic pain syndrome: Secondary | ICD-10-CM | POA: Diagnosis not present

## 2017-09-21 DIAGNOSIS — Z981 Arthrodesis status: Secondary | ICD-10-CM | POA: Insufficient documentation

## 2017-09-21 DIAGNOSIS — M5416 Radiculopathy, lumbar region: Secondary | ICD-10-CM | POA: Diagnosis not present

## 2017-09-21 MED ORDER — GABAPENTIN 300 MG PO CAPS
ORAL_CAPSULE | ORAL | 5 refills | Status: DC
Start: 2017-09-21 — End: 2018-02-15

## 2017-09-21 MED ORDER — OXYCODONE HCL 10 MG PO TABS: 10.0000 mg | ORAL_TABLET | Freq: Four times a day (QID) | ORAL | 0 refills | Status: DC | PRN

## 2017-09-21 MED ORDER — MORPHINE SULFATE ER 30 MG PO TBCR: 30.0000 mg | EXTENDED_RELEASE_TABLET | Freq: Two times a day (BID) | ORAL | 0 refills | Status: DC

## 2017-09-21 MED ORDER — IBUPROFEN 800 MG PO TABS: 800.0000 mg | ORAL_TABLET | Freq: Three times a day (TID) | ORAL | 5 refills | Status: DC | PRN

## 2017-09-21 NOTE — Progress Notes (Signed)
Subjective:    Patient ID: Samuel [name], male    DOB: 1975/08/23, 43 y.o.   MRN: [id number]  HPI: [name] is a 42 year old male who is here for follow up appointment for chronic pain and medication refill. He states his pain is located in his lower back radiating into his bilateral lower extremities. He rates his pain 7. His current exercise regime is walking.  [name] Morphine Equivalent is 116.00 MME.  Last UDS was Performed on 07/20/2017, it was consistent.    Pain Inventory Average Pain 6 Pain Right Now 7 My pain is sharp, dull and stabbing  In the last 24 hours, has pain interfered with the following? General activity 8 Relation with others 8 Enjoyment of life 9 What TIME of day is your pain at its worst? morning, evening, night Sleep (in general) Fair  Pain is worse with: walking, bending, sitting and standing Pain improves with: rest and medication Relief from Meds: 5  Mobility walk with assistance use a cane how many minutes can you walk? 5 ability to climb steps?  yes do you drive?  yes  Function disabled: date disabled 2010 I need assistance with the following:  dressing, bathing, meal prep and household duties  Neuro/Psych weakness trouble walking depression  Prior Studies Any changes since last visit?  no  Physicians involved in your care Any changes since last visit?  no   Family History  Problem Relation Age of Onset  . Hypertension Father    Social History   Socioeconomic History  . Marital status: Married    Spouse name: Not on file  . Number of children: Not on file  . Years of education: Not on file  . Highest education level: Not on file  Occupational History  . Not on file  Social Needs  . Financial resource strain: Not on file  . Food insecurity:    Worry: Not on file    Inability: Not on file  . Transportation needs:    Medical: Not on file    Non-medical: Not on file  Tobacco Use  . Smoking status:  Never Smoker  . Smokeless tobacco: Never Used  Substance and Sexual Activity  . Alcohol use: No    Alcohol/week: 0.0 oz  . Drug use: No  . Sexual activity: Not on file  Lifestyle  . Physical activity:    Days per week: Not on file    Minutes per session: Not on file  . Stress: Not on file  Relationships  . Social connections:    Talks on phone: Not on file    Gets together: Not on file    Attends religious service: Not on file    Active member of club or organization: Not on file    Attends meetings of clubs or organizations: Not on file    Relationship status: Not on file  Other Topics Concern  . Not on file  Social History Narrative  . Not on file   Past Surgical History:  Procedure Laterality Date  . BACK SURGERY     lower back surgery x 2   Past Medical History:  Diagnosis Date  . Arthritis    "some in my back"  . GERD (gastroesophageal reflux disease)   . Headache(784.0)    BP 131/79 (BP Location: Right Arm, Patient Position: Sitting, Cuff Size: Large)   Pulse 65   Resp 14   Ht 6\' 1"  (1.854 m)   Wt Marland Kitchen)  311 lb (141.1 kg)   SpO2 97%   BMI 41.03 kg/m   Opioid Risk Score:   Fall Risk Score:  `1  Depression screen PHQ 2/9  Depression screen [facility] 2/9 03/26/2017 01/26/2017 10/28/2016 09/23/2016 07/27/2016 05/05/2016 04/01/2016  Decreased Interest 1 1 0 1 1 0 -  Down, Depressed, Hopeless 1 1 0 1 1 0 0  PHQ - 2 Score 2 2 0 2 2 0 0  Altered sleeping - - - - - - -  Tired, decreased energy - - - - - - -  Change in appetite - - - - - - -  Feeling bad or failure about yourself  - - - - - - -  Trouble concentrating - - - - - - -  Moving slowly or fidgety/restless - - - - - - -  Suicidal thoughts - - - - - - -  PHQ-9 Score - - - - - - -  Difficult doing work/chores - - - - - - -    Review of Systems  Constitutional: Negative.   HENT: Negative.   Eyes: Negative.   Respiratory: Negative.   Cardiovascular: Negative.   Gastrointestinal: Negative.   Endocrine: Negative.    Genitourinary: Negative.   Musculoskeletal: Positive for back pain and gait problem.  Allergic/Immunologic: Negative.   Neurological: Positive for weakness.  Hematological: Negative.   Psychiatric/Behavioral: Positive for dysphoric mood.  All other systems reviewed and are negative.      Objective:   Physical Exam  Constitutional: He is oriented to person, place, and time. He appears well-developed and well-nourished.  HENT:  Head: Normocephalic and atraumatic.  Neck: Normal range of motion. Neck supple.  Cardiovascular: Normal rate and normal heart sounds.  Pulmonary/Chest: Effort normal and breath sounds normal.  Musculoskeletal:  Normal Muscle Bulk and Muscle Testing Reveals: Upper Extremities: Full ROM and Muscle Strength 5/5 Lumbar Hypersensitivity Lower Extremities: Decreased ROM and Muscle Strength 4/5 Arises from Table Slowly, using cane for support Antalgic gait   Neurological: He is alert and oriented to person, place, and time.  Skin: Skin is warm and dry.  Psychiatric: He has a normal mood and affect.  Nursing note and vitals reviewed.         Assessment & Plan:  1.  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 He has lumbar degenerative disc disease. Continue current medication regimen with  Gabapentin, Continue current Medication regimen . 09/21/2017 Refilled: MS Contin30 mg one tablet every 12 hours #60 and Oxycodone 10mg  one tablet every 6 hours as needed for pain #120. We will continue the opioid monitoring program, this consists of regular clinic visits, examinations, urine drug screen, pill counts as well as use of [place] Controlled Substance reporting System. S/PLeft-sided Lumbar Facet Radiofrequency Ablation under fluoroscopic guidance with IV sedation on November 8,2017 withDr. Consuela Mimes, with good relief noted. 2. Sacroiliac Dysfunction: Continue with current medication, heat and exercise regime. 09/21/2017 3.  Muscle Spasm: No complaints Today. Continue to Monitor.09/21/2017  20 mminutes of face to face patient care time was spent during this visit. All questions were encouraged and answered.   F/U in 1 month.

## 2017-10-14 ENCOUNTER — Encounter: Payer: Self-pay | Admitting: Registered Nurse

## 2017-10-14 ENCOUNTER — Encounter: Payer: PPO | Attending: Physical Medicine & Rehabilitation | Admitting: Registered Nurse

## 2017-10-14 VITALS — BP 124/80 | HR 70 | Resp 14 | Ht 72.0 in | Wt 310.0 lb

## 2017-10-14 DIAGNOSIS — Z5181 Encounter for therapeutic drug level monitoring: Secondary | ICD-10-CM

## 2017-10-14 DIAGNOSIS — M961 Postlaminectomy syndrome, not elsewhere classified: Secondary | ICD-10-CM | POA: Diagnosis not present

## 2017-10-14 DIAGNOSIS — M79604 Pain in right leg: Secondary | ICD-10-CM | POA: Diagnosis not present

## 2017-10-14 DIAGNOSIS — G8928 Other chronic postprocedural pain: Secondary | ICD-10-CM | POA: Diagnosis not present

## 2017-10-14 DIAGNOSIS — Z981 Arthrodesis status: Secondary | ICD-10-CM | POA: Insufficient documentation

## 2017-10-14 DIAGNOSIS — M62830 Muscle spasm of back: Secondary | ICD-10-CM | POA: Diagnosis not present

## 2017-10-14 DIAGNOSIS — Z79899 Other long term (current) drug therapy: Secondary | ICD-10-CM

## 2017-10-14 DIAGNOSIS — G894 Chronic pain syndrome: Secondary | ICD-10-CM

## 2017-10-14 DIAGNOSIS — M79605 Pain in left leg: Secondary | ICD-10-CM | POA: Insufficient documentation

## 2017-10-14 DIAGNOSIS — M5136 Other intervertebral disc degeneration, lumbar region: Secondary | ICD-10-CM | POA: Diagnosis not present

## 2017-10-14 DIAGNOSIS — M545 Low back pain: Secondary | ICD-10-CM | POA: Insufficient documentation

## 2017-10-14 DIAGNOSIS — M5416 Radiculopathy, lumbar region: Secondary | ICD-10-CM

## 2017-10-14 MED ORDER — OXYCODONE HCL 10 MG PO TABS: 10.0000 mg | ORAL_TABLET | Freq: Four times a day (QID) | ORAL | 0 refills | Status: DC | PRN

## 2017-10-14 MED ORDER — MORPHINE SULFATE ER 30 MG PO TBCR: 30.0000 mg | EXTENDED_RELEASE_TABLET | Freq: Two times a day (BID) | ORAL | 0 refills | Status: DC

## 2017-10-14 NOTE — Progress Notes (Signed)
Subjective:    Patient ID: Samuel [name], male    DOB: [birth year], 42 y.o.   MRN: [id number]  HPI: Samuel [name] is a 42 year old male who returns for follow up appointment for chronic pain and medication refill. He states his pain is located in his lower back radiating into his right lower extremity. He rates his pain 7. His current exercise regime is walking.   Samuel [name] Morphine Equivalent is 124.00 MME. Last UDS was Performed on 07/20/2017. UDS ordered today.   Pain Inventory Average Pain 6 Pain Right Now 7 My pain is sharp, dull, stabbing and aching  In the last 24 hours, has pain interfered with the following? General activity 8 Relation with others 8 Enjoyment of life 9 What TIME of day is your pain at its worst? morning, evening, night Sleep (in general) Fair  Pain is worse with: walking, bending, sitting and standing Pain improves with: rest and medication Relief from Meds: 5  Mobility walk with assistance use a cane how many minutes can you walk? 5 ability to climb steps?  yes do you drive?  yes Do you have any goals in this area?  no  Function disabled: date disabled . I need assistance with the following:  dressing, bathing, meal prep and household duties Do you have any goals in this area?  no  Neuro/Psych weakness depression  Prior Studies Any changes since last visit?  no  Physicians involved in your care Any changes since last visit?  no   Family History  Problem Relation Age of Onset  . Hypertension Father    Social History   Socioeconomic History  . Marital status: Married    Spouse name: Not on file  . Number of children: Not on file  . Years of education: Not on file  . Highest education level: Not on file  Occupational History  . Not on file  Social Needs  . Financial resource strain: Not on file  . Food insecurity:    Worry: Not on file    Inability: Not on file  . Transportation needs:    Medical: Not on file      Non-medical: Not on file  Tobacco Use  . Smoking status: Never Smoker  . Smokeless tobacco: Never Used  Substance and Sexual Activity  . Alcohol use: No    Alcohol/week: 0.0 oz  . Drug use: No  . Sexual activity: Not on file  Lifestyle  . Physical activity:    Days per week: Not on file    Minutes per session: Not on file  . Stress: Not on file  Relationships  . Social connections:    Talks on phone: Not on file    Gets together: Not on file    Attends religious service: Not on file    Active member of club or organization: Not on file    Attends meetings of clubs or organizations: Not on file    Relationship status: Not on file  Other Topics Concern  . Not on file  Social History Narrative  . Not on file   Past Surgical History:  Procedure Laterality Date  . BACK SURGERY     lower back surgery x 2   Past Medical History:  Diagnosis Date  . Arthritis    "some in my back"  . GERD (gastroesophageal reflux disease)   . Headache(784.0)    BP 124/80   Pulse 70   Resp 14  Ht 6' (1.829 m)   Wt (!) 310 lb (140.6 kg)   SpO2 97%   BMI 42.04 kg/m   Opioid Risk Score:   Fall Risk Score:  `1  Depression screen PHQ 2/9  Depression screen [facility] 2/9 03/26/2017 01/26/2017 10/28/2016 09/23/2016 07/27/2016 05/05/2016 04/01/2016  Decreased Interest 1 1 0 1 1 0 -  Down, Depressed, Hopeless 1 1 0 1 1 0 0  PHQ - 2 Score 2 2 0 2 2 0 0  Altered sleeping - - - - - - -  Tired, decreased energy - - - - - - -  Change in appetite - - - - - - -  Feeling bad or failure about yourself  - - - - - - -  Trouble concentrating - - - - - - -  Moving slowly or fidgety/restless - - - - - - -  Suicidal thoughts - - - - - - -  PHQ-9 Score - - - - - - -  Difficult doing work/chores - - - - - - -    Review of Systems  Constitutional: Negative.   HENT: Negative.   Eyes: Negative.   Respiratory: Negative.   Cardiovascular: Negative.   Gastrointestinal: Negative.   Endocrine: Negative.    Genitourinary: Negative.   Musculoskeletal: Positive for back pain and gait problem.  Skin: Negative.   Allergic/Immunologic: Negative.   Neurological: Positive for weakness.  Hematological: Negative.   Psychiatric/Behavioral: Positive for dysphoric mood.       Objective:   Physical Exam  Constitutional: He is oriented to person, place, and time. He appears well-developed and well-nourished.  HENT:  Head: Normocephalic and atraumatic.  Neck: Normal range of motion. Neck supple.  Cardiovascular: Normal rate and regular rhythm.  Pulmonary/Chest: Effort normal and breath sounds normal.  Musculoskeletal:  Normal Muscle Bulk and Muscle Testing Reveals:  Upper Extremities:Full ROM and Muscle strength 5/5 Lumbar Hypersensitivity Lower Extremities: Right Decreased ROM and Lower Extremity Flexion Produces Pain into Lumbar and Extremity Left: Full ROM and Muscle Strength 5/5 Arises from Table Slowly Antalgic gait  Neurological: He is alert and oriented to person, place, and time.  Skin: Skin is warm and dry.  Nursing note and vitals reviewed.         Assessment & Plan:  1. 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 He has lumbar degenerative disc disease. Continue current medication regimen with  Gabapentin, Continue current Medication regimen . 10/14/2017 Refilled: MS Contin30 mg one tablet every 12 hours #60 and Oxycodone 10mg  one tablet every 6 hours as needed for pain #120. We will continue the opioid monitoring program, this consists of regular clinic visits, examinations, urine drug screen, pill counts as well as use of [place] Controlled Substance reporting System. S/PLeft-sided Lumbar Facet Radiofrequency Ablation under fluoroscopic guidance with IV sedation on November 8,2017 withDr. Consuela Mimes, with good relief noted. 2. Sacroiliac Dysfunction: Continue with current medication, heat and exercise regime. 10/14/2017 3. Muscle Spasm:  No complaints Today. Continue to Monitor.10/14/2017  20 mminutes of face to face patient care time was spent during this visit. All questions were encouraged and answered.   F/U in 1 month.

## 2017-10-15 ENCOUNTER — Ambulatory Visit: Payer: PPO | Admitting: Registered Nurse

## 2017-10-22 LAB — 6-ACETYLMORPHINE,TOXASSURE ADD
6-ACETYLMORPHINE: NEGATIVE
6-acetylmorphine: NOT DETECTED ng/mg creat

## 2017-10-22 LAB — TOXASSURE SELECT,+ANTIDEPR,UR

## 2017-11-02 ENCOUNTER — Telehealth: Payer: Self-pay | Admitting: *Deleted

## 2017-11-02 NOTE — Telephone Encounter (Signed)
Urine drug screen for this encounter is consistent for prescribed medication 

## 2017-11-16 ENCOUNTER — Encounter: Payer: PPO | Attending: Physical Medicine & Rehabilitation | Admitting: Registered Nurse

## 2017-11-16 ENCOUNTER — Encounter: Payer: Self-pay | Admitting: Registered Nurse

## 2017-11-16 VITALS — BP 122/80 | HR 77 | Resp 16 | Ht 73.0 in | Wt 305.0 lb

## 2017-11-16 DIAGNOSIS — Z79899 Other long term (current) drug therapy: Secondary | ICD-10-CM | POA: Diagnosis not present

## 2017-11-16 DIAGNOSIS — M961 Postlaminectomy syndrome, not elsewhere classified: Secondary | ICD-10-CM | POA: Diagnosis not present

## 2017-11-16 DIAGNOSIS — Z5181 Encounter for therapeutic drug level monitoring: Secondary | ICD-10-CM | POA: Diagnosis not present

## 2017-11-16 DIAGNOSIS — M5136 Other intervertebral disc degeneration, lumbar region: Secondary | ICD-10-CM | POA: Diagnosis not present

## 2017-11-16 DIAGNOSIS — M545 Low back pain: Secondary | ICD-10-CM | POA: Insufficient documentation

## 2017-11-16 DIAGNOSIS — Z981 Arthrodesis status: Secondary | ICD-10-CM | POA: Insufficient documentation

## 2017-11-16 DIAGNOSIS — G894 Chronic pain syndrome: Secondary | ICD-10-CM | POA: Diagnosis not present

## 2017-11-16 DIAGNOSIS — M5416 Radiculopathy, lumbar region: Secondary | ICD-10-CM

## 2017-11-16 DIAGNOSIS — M79605 Pain in left leg: Secondary | ICD-10-CM | POA: Insufficient documentation

## 2017-11-16 DIAGNOSIS — M62830 Muscle spasm of back: Secondary | ICD-10-CM | POA: Diagnosis not present

## 2017-11-16 DIAGNOSIS — M79604 Pain in right leg: Secondary | ICD-10-CM | POA: Insufficient documentation

## 2017-11-16 DIAGNOSIS — G8928 Other chronic postprocedural pain: Secondary | ICD-10-CM | POA: Diagnosis not present

## 2017-11-16 MED ORDER — OXYCODONE HCL 10 MG PO TABS: 10.0000 mg | ORAL_TABLET | Freq: Four times a day (QID) | ORAL | 0 refills | Status: DC | PRN

## 2017-11-16 MED ORDER — MORPHINE SULFATE ER 30 MG PO TBCR: 30.0000 mg | EXTENDED_RELEASE_TABLET | Freq: Two times a day (BID) | ORAL | 0 refills | Status: DC

## 2017-11-16 NOTE — Progress Notes (Signed)
Subjective:    Patient ID: Samuel [name], male    DOB: [birth year], 42 y.o.   MRN: [id number]  HPI: Samuel [name] is a 42 year old male who returns for follow up appointment for chronic pain and medication refill. He states his pain is located in his lower back radiating into his lower extremities. Also reports his radicular pain has increased in intensity and will be calling Dr. Dossie Arbour office he states. He rates his pain 7. His current exercise regime is walking.   Samuel [name] Morphine Equivalent is 120.00 MME. Last UDS was Performed on 10/14/2017, it was consistent.   Pain Inventory Average Pain 5 Pain Right Now 7 My pain is constant, sharp, burning, dull, stabbing and aching  In the last 24 hours, has pain interfered with the following? General activity 8 Relation with others 9 Enjoyment of life 9 What TIME of day is your pain at its worst? morning, evening, night Sleep (in general) Fair  Pain is worse with: walking, bending, sitting, inactivity and standing Pain improves with: rest and medication Relief from Meds: 5  Mobility walk with assistance use a cane how many minutes can you walk? 5 ability to climb steps?  yes do you drive?  yes  Function disabled: date disabled 2010 I need assistance with the following:  dressing, bathing, meal prep, household duties and shopping  Neuro/Psych weakness trouble walking  Prior Studies Any changes since last visit?  no  Physicians involved in your care Any changes since last visit?  no   Family History  Problem Relation Age of Onset  . Hypertension Father    Social History   Socioeconomic History  . Marital status: Married    Spouse name: Not on file  . Number of children: Not on file  . Years of education: Not on file  . Highest education level: Not on file  Occupational History  . Not on file  Social Needs  . Financial resource strain: Not on file  . Food insecurity:    Worry: Not on file   Inability: Not on file  . Transportation needs:    Medical: Not on file    Non-medical: Not on file  Tobacco Use  . Smoking status: Never Smoker  . Smokeless tobacco: Never Used  Substance and Sexual Activity  . Alcohol use: No    Alcohol/week: 0.0 oz  . Drug use: No  . Sexual activity: Not on file  Lifestyle  . Physical activity:    Days per week: Not on file    Minutes per session: Not on file  . Stress: Not on file  Relationships  . Social connections:    Talks on phone: Not on file    Gets together: Not on file    Attends religious service: Not on file    Active member of club or organization: Not on file    Attends meetings of clubs or organizations: Not on file    Relationship status: Not on file  Other Topics Concern  . Not on file  Social History Narrative  . Not on file   Past Surgical History:  Procedure Laterality Date  . BACK SURGERY     lower back surgery x 2   Past Medical History:  Diagnosis Date  . Arthritis    "some in my back"  . GERD (gastroesophageal reflux disease)   . Headache(784.0)    BP 122/80 (BP Location: Right Arm, Patient Position: Sitting, Cuff Size: Large)  Pulse 77   Resp 16   Ht 6\' 1"  (1.854 m)   Wt (!) 305 lb (138.3 kg)   SpO2 96%   BMI 40.24 kg/m   Opioid Risk Score:   Fall Risk Score:  `1  Depression screen PHQ 2/9  Depression screen [facility] 2/9 03/26/2017 01/26/2017 10/28/2016 09/23/2016 07/27/2016 05/05/2016 04/01/2016  Decreased Interest 1 1 0 1 1 0 -  Down, Depressed, Hopeless 1 1 0 1 1 0 0  PHQ - 2 Score 2 2 0 2 2 0 0  Altered sleeping - - - - - - -  Tired, decreased energy - - - - - - -  Change in appetite - - - - - - -  Feeling bad or failure about yourself  - - - - - - -  Trouble concentrating - - - - - - -  Moving slowly or fidgety/restless - - - - - - -  Suicidal thoughts - - - - - - -  PHQ-9 Score - - - - - - -  Difficult doing work/chores - - - - - - -    Review of Systems  Constitutional: Negative.   HENT:  Negative.   Eyes: Negative.   Respiratory: Negative.   Cardiovascular: Negative.   Gastrointestinal: Negative.   Endocrine: Negative.   Genitourinary: Negative.   Musculoskeletal: Positive for back pain, gait problem and myalgias.  Skin: Negative.   Allergic/Immunologic: Negative.   Neurological: Positive for weakness.  Hematological: Negative.   Psychiatric/Behavioral: Negative.        Objective:   Physical Exam  Constitutional: He is oriented to person, place, and time. He appears well-developed and well-nourished.  HENT:  Head: Normocephalic and atraumatic.  Neck: Normal range of motion. Neck supple.  Cardiovascular: Normal rate and regular rhythm.  Pulmonary/Chest: Effort normal and breath sounds normal.  Musculoskeletal:  Normal Muscle Bulk and Muscle Testing Reveals: Upper Extremities: Decreased ROM 90 Degrees and Muscle Strength 5/5 Lumbar Hypersensitivity Lower Extremities: Decreased ROM and Muscle Strength 5/5 Bilateral Lower Extremities Flexion Produces Pain into Lumbar Arises from Table Slowly Antalgic gait   Neurological: He is alert and oriented to person, place, and time.  Skin: Skin is warm and dry.  Psychiatric: He has a normal mood and affect. His behavior is normal.  Nursing note and vitals reviewed.         Assessment & Plan:  1. 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 He has lumbar degenerative disc disease. Continuecurrent medication regimen withGabapentin, Continue current Medication regimen. 11/16/2017 Refilled: MS Contin30 mg one tablet every 12 hours #60 and Oxycodone 10mg  one tablet every 6 hours as needed for pain #120. We will continue the opioid monitoring program, this consists of regular clinic visits, examinations, urine drug screen, pill counts as well as use of [place] Controlled Substance reporting System. S/PLeft-sided Lumbar Facet Radiofrequency Ablation under fluoroscopic guidance  with IV sedation on November 8,2017 withDr. Consuela Mimes, with good relief noted. 2. Sacroiliac Dysfunction: Continue with current medication, heat and exercise regime. 11/16/2017 3. Muscle Spasm:No complaints Today.Continueto Monitor.11/16/2017  20 mminutes of face to face patient care time was spent during this visit. All questions were encouraged and answered.   F/U in 1 month.

## 2017-12-14 ENCOUNTER — Other Ambulatory Visit: Payer: Self-pay

## 2017-12-14 ENCOUNTER — Encounter: Payer: PPO | Attending: Physical Medicine & Rehabilitation | Admitting: Registered Nurse

## 2017-12-14 ENCOUNTER — Encounter: Payer: Self-pay | Admitting: Registered Nurse

## 2017-12-14 VITALS — BP 116/79 | HR 82 | Ht 73.0 in | Wt 304.4 lb

## 2017-12-14 DIAGNOSIS — M62830 Muscle spasm of back: Secondary | ICD-10-CM | POA: Diagnosis not present

## 2017-12-14 DIAGNOSIS — Z981 Arthrodesis status: Secondary | ICD-10-CM | POA: Diagnosis not present

## 2017-12-14 DIAGNOSIS — M5136 Other intervertebral disc degeneration, lumbar region: Secondary | ICD-10-CM

## 2017-12-14 DIAGNOSIS — G894 Chronic pain syndrome: Secondary | ICD-10-CM | POA: Diagnosis not present

## 2017-12-14 DIAGNOSIS — M79605 Pain in left leg: Secondary | ICD-10-CM | POA: Diagnosis not present

## 2017-12-14 DIAGNOSIS — M79604 Pain in right leg: Secondary | ICD-10-CM | POA: Diagnosis not present

## 2017-12-14 DIAGNOSIS — M5416 Radiculopathy, lumbar region: Secondary | ICD-10-CM | POA: Diagnosis not present

## 2017-12-14 DIAGNOSIS — G8928 Other chronic postprocedural pain: Secondary | ICD-10-CM | POA: Diagnosis not present

## 2017-12-14 DIAGNOSIS — M545 Low back pain: Secondary | ICD-10-CM | POA: Insufficient documentation

## 2017-12-14 DIAGNOSIS — Z79899 Other long term (current) drug therapy: Secondary | ICD-10-CM

## 2017-12-14 DIAGNOSIS — M961 Postlaminectomy syndrome, not elsewhere classified: Secondary | ICD-10-CM | POA: Insufficient documentation

## 2017-12-14 DIAGNOSIS — Z5181 Encounter for therapeutic drug level monitoring: Secondary | ICD-10-CM

## 2017-12-14 MED ORDER — OXYCODONE HCL 10 MG PO TABS: 10.0000 mg | ORAL_TABLET | Freq: Four times a day (QID) | ORAL | 0 refills | Status: DC | PRN

## 2017-12-14 MED ORDER — MORPHINE SULFATE ER 30 MG PO TBCR: 30.0000 mg | EXTENDED_RELEASE_TABLET | Freq: Two times a day (BID) | ORAL | 0 refills | Status: DC

## 2017-12-14 NOTE — Progress Notes (Signed)
Subjective:    Patient ID: Samuel [name], male    DOB: 31-Jan-1976, 42 y.o.   MRN: [id number]  HPI: [name] is a 42 year old male who returns for follow up appointment for chronic pain and medication refill. He states his pain is located in his lower back radiating into his bilateral lower extremities. [name] reports his radicular pain has increased in intensity over the last few months. This provider has discussed with him making an appointment with Dr. Dossie [name], he stated he forgot to call for an appointment. This provider called Dr. Dossie [name] office today, and we will fax notes per their request. Dr. Dossie [name] performed a Radiofrequency Lesioning on 10/29/2015 and 04/01/2016. The above discussed with Dr. Letta [name] and he agrees with plan.   [name] is 120 MME. Last UDS was Performed on 10/14/2017, it was consistent.   Pain Inventory Average Pain 7 Pain Right Now 7 My pain is constant, sharp, dull, stabbing and aching  In the last 24 hours, has pain interfered with the following? General activity 9 Relation with others 9 Enjoyment of life 9 What TIME of day is your pain at its worst? morning evening night Sleep (in general) Fair  Pain is worse with: walking, bending, sitting and standing Pain improves with: rest and medication Relief from Meds: 5  Mobility use a cane how many minutes can you walk? 5 ability to climb steps?  yes do you drive?  yes  Function disabled: date disabled 2010 I need assistance with the following:  dressing, bathing, meal prep, household duties and shopping  Neuro/Psych weakness trouble walking depression  Prior Studies Any changes since last visit?  no  Physicians involved in your care Any changes since last visit?  no   Family History  Problem Relation Age of Onset  . Hypertension Father    Social History   Socioeconomic History  . Marital status: Married    Spouse name: Not on file  . Number of  children: Not on file  . Years of education: Not on file  . Highest education level: Not on file  Occupational History  . Not on file  Social Needs  . Financial resource strain: Not on file  . Food insecurity:    Worry: Not on file    Inability: Not on file  . Transportation needs:    Medical: Not on file    Non-medical: Not on file  Tobacco Use  . Smoking status: Never Smoker  . Smokeless tobacco: Never Used  Substance and Sexual Activity  . Alcohol use: No    Alcohol/week: 0.0 oz  . Drug use: No  . Sexual activity: Not on file  Lifestyle  . Physical activity:    Days per week: Not on file    Minutes per session: Not on file  . Stress: Not on file  Relationships  . Social connections:    Talks on phone: Not on file    Gets together: Not on file    Attends religious service: Not on file    Active member of club or organization: Not on file    Attends meetings of clubs or organizations: Not on file    Relationship status: Not on file  Other Topics Concern  . Not on file  Social History Narrative  . Not on file   Past Surgical History:  Procedure Laterality Date  . BACK SURGERY     lower back surgery x 2  Past Medical History:  Diagnosis Date  . Arthritis    "some in my back"  . GERD (gastroesophageal reflux disease)   . Headache(784.0)    Ht 6\' 1"  (1.854 m)   Wt (!) 304 lb 6.4 oz (138.1 kg)   BMI 40.16 kg/m   Opioid Risk Score:   Fall Risk Score:  `1  Depression screen PHQ 2/9  Depression screen Samuel [name] 2/9 12/14/2017 03/26/2017 01/26/2017 10/28/2016 09/23/2016 07/27/2016 05/05/2016  Decreased Interest 1 1 1  0 1 1 0  Down, Depressed, Hopeless 1 1 1  0 1 1 0  PHQ - 2 Score 2 2 2  0 2 2 0  Altered sleeping - - - - - - -  Tired, decreased energy - - - - - - -  Change in appetite - - - - - - -  Feeling bad or failure about yourself  - - - - - - -  Trouble concentrating - - - - - - -  Moving slowly or fidgety/restless - - - - - - -  Suicidal thoughts - - - - - - -    PHQ-9 Score - - - - - - -  Difficult doing work/chores - - - - - - -    Review of Systems  Constitutional: Negative.   HENT: Negative.   Eyes: Negative.   Respiratory: Negative.   Cardiovascular: Negative.   Gastrointestinal: Negative.   Endocrine: Negative.   Genitourinary: Negative.   Musculoskeletal: Negative.   Skin: Negative.   Allergic/Immunologic: Negative.   Neurological: Negative.   Hematological: Negative.   Psychiatric/Behavioral: Negative.   All other systems reviewed and are negative.      Objective:   Physical Exam  Constitutional: He is oriented to person, place, and time. He appears well-developed and well-nourished.  HENT:  Head: Normocephalic and atraumatic.  Neck: Normal range of motion. Neck supple.  Cardiovascular: Normal rate and regular rhythm.  Pulmonary/Chest: Effort normal and breath sounds normal.  Musculoskeletal:  Normal Muscle Bulk and Muscle Testing Reveals: Upper Extremities: Full ROM and Muscle Strength 5/5  Lumbar Hypersensitivity Lower Extremities: Decreased ROM and Muscle Strength 4/5 Bilateral Lower Extremities Flexion Produces Pain into Lumbar and Lower Extremities Arises from Table Slowly using cane Antalgic gait   Neurological: He is alert and oriented to person, place, and time.  Skin: Skin is warm and dry.  Psychiatric: He has a normal mood and affect. His behavior is normal.  Nursing note and vitals reviewed.         Assessment & Plan:  1. 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 He has lumbar degenerative disc disease. Continuecurrent medication regimen withGabapentin, Continue current Medication regimen. Schedule an appointment with Dr. Dossie [name] appointment for Lumbar Facet Radiofrequency Ablation with sedation, if Dr. Dossie [name] agrees with the above plan. 12/14/2017 Refilled: MS Contin30 mg one tablet every 12 hours #60 and Oxycodone 10mg  one tablet every 6 hours as needed for  pain #120. We will continue the opioid monitoring program, this consists of regular clinic visits, examinations, urine drug screen, pill counts as well as use of [place] Controlled Substance reporting System. S/PLeft-sided Lumbar Facet Radiofrequency Ablation under fluoroscopic guidance with IV sedation on November 8,2017 withDr. Consuela Mimes, with good relief noted. 2. Sacroiliac Dysfunction: Continue with current medication, heat and exercise regime. 12/14/2017 3. Muscle Spasm:No complaints Today.Continueto Monitor.12/14/2017  20 mminutes of face to face patient care time was spent during this visit. All questions were encouraged and answered.  F/U in 1 month.

## 2018-01-18 ENCOUNTER — Encounter: Payer: Self-pay | Admitting: Registered Nurse

## 2018-01-18 ENCOUNTER — Encounter: Payer: PPO | Attending: Physical Medicine & Rehabilitation | Admitting: Registered Nurse

## 2018-01-18 VITALS — BP 119/79 | HR 78 | Resp 14 | Ht 73.0 in | Wt 306.0 lb

## 2018-01-18 DIAGNOSIS — M62830 Muscle spasm of back: Secondary | ICD-10-CM | POA: Diagnosis not present

## 2018-01-18 DIAGNOSIS — G8928 Other chronic postprocedural pain: Secondary | ICD-10-CM | POA: Insufficient documentation

## 2018-01-18 DIAGNOSIS — M5136 Other intervertebral disc degeneration, lumbar region: Secondary | ICD-10-CM

## 2018-01-18 DIAGNOSIS — G894 Chronic pain syndrome: Secondary | ICD-10-CM

## 2018-01-18 DIAGNOSIS — Z79899 Other long term (current) drug therapy: Secondary | ICD-10-CM | POA: Diagnosis not present

## 2018-01-18 DIAGNOSIS — M961 Postlaminectomy syndrome, not elsewhere classified: Secondary | ICD-10-CM | POA: Diagnosis not present

## 2018-01-18 DIAGNOSIS — M545 Low back pain: Secondary | ICD-10-CM | POA: Insufficient documentation

## 2018-01-18 DIAGNOSIS — M79605 Pain in left leg: Secondary | ICD-10-CM | POA: Insufficient documentation

## 2018-01-18 DIAGNOSIS — M79604 Pain in right leg: Secondary | ICD-10-CM | POA: Insufficient documentation

## 2018-01-18 DIAGNOSIS — Z981 Arthrodesis status: Secondary | ICD-10-CM | POA: Diagnosis not present

## 2018-01-18 DIAGNOSIS — Z5181 Encounter for therapeutic drug level monitoring: Secondary | ICD-10-CM | POA: Diagnosis not present

## 2018-01-18 DIAGNOSIS — M5416 Radiculopathy, lumbar region: Secondary | ICD-10-CM

## 2018-01-18 MED ORDER — MORPHINE SULFATE ER 30 MG PO TBCR: 30.0000 mg | EXTENDED_RELEASE_TABLET | Freq: Two times a day (BID) | ORAL | 0 refills | Status: DC

## 2018-01-18 MED ORDER — OXYCODONE HCL 10 MG PO TABS: 10.0000 mg | ORAL_TABLET | Freq: Four times a day (QID) | ORAL | 0 refills | Status: DC | PRN

## 2018-01-18 NOTE — Progress Notes (Signed)
Subjective:     Patient ID: Samuel [name], male   DOB: Jun 05, 1975, 42 y.o.   MRN: [id number]  HPI: Samuel [name] is a 42 year old male who returns for follow up appointment for chronic pain and medication refill. He states his pain is located in his lower back radiating into his bilateral lower extremities. He rates his pain 7. His current exercise regime is walking.   Mr. Dennard Morphine Equivalent is 120.00 MME. Last UDS was Performed on 10/14/2017, it was consistent.   Pain Inventory Average Pain 6 Pain Right Now 7 My pain is sharp, burning, dull, stabbing and aching  In the last 24 hours, has pain interfered with the following? General activity 8 Relation with others 9 Enjoyment of life 9 What TIME of day is your pain at its worst? morning,evening, night Sleep (in general) Fair  Pain is worse with: walking, bending, sitting and standing Pain improves with: rest and medication Relief from Meds: 5  Mobility walk with assistance use a cane how many minutes can you walk? 5 ability to climb steps?  yes do you drive?  yes Do you have any goals in this area?  no  Function disabled: date disabled 2010 I need assistance with the following:  dressing, bathing, meal prep, household duties and shopping  Neuro/Psych weakness trouble walking depression  Prior Studies Any changes since last visit?  no  Physicians involved in your care Any changes since last visit?  no   Family History  Problem Relation Age of Onset  . Hypertension Father    Social History   Socioeconomic History  . Marital status: Married    Spouse name: Not on file  . Number of children: Not on file  . Years of education: Not on file  . Highest education level: Not on file  Occupational History  . Not on file  Social Needs  . Financial resource strain: Not on file  . Food insecurity:    Worry: Not on file    Inability: Not on file  . Transportation needs:    Medical: Not on file     Non-medical: Not on file  Tobacco Use  . Smoking status: Never Smoker  . Smokeless tobacco: Never Used  Substance and Sexual Activity  . Alcohol use: No    Alcohol/week: 0.0 standard drinks  . Drug use: No  . Sexual activity: Not on file  Lifestyle  . Physical activity:    Days per week: Not on file    Minutes per session: Not on file  . Stress: Not on file  Relationships  . Social connections:    Talks on phone: Not on file    Gets together: Not on file    Attends religious service: Not on file    Active member of club or organization: Not on file    Attends meetings of clubs or organizations: Not on file    Relationship status: Not on file  Other Topics Concern  . Not on file  Social History Narrative  . Not on file   Past Surgical History:  Procedure Laterality Date  . BACK SURGERY     lower back surgery x 2   Past Medical History:  Diagnosis Date  . Arthritis    "some in my back"  . GERD (gastroesophageal reflux disease)   . Headache(784.0)    BP 119/79   Pulse 78   Resp 14   Ht 6\' 1"  (1.854 m)   Wt Marland Kitchen)  306 lb (138.8 kg)   SpO2 95%   BMI 40.37 kg/m   Opioid Risk Score:   Fall Risk Score:  `1  Depression screen PHQ 2/9  Depression screen [facility] 2/9 12/14/2017 03/26/2017 01/26/2017 10/28/2016 09/23/2016 07/27/2016 05/05/2016  Decreased Interest 1 1 1  0 1 1 0  Down, Depressed, Hopeless 1 1 1  0 1 1 0  PHQ - 2 Score 2 2 2  0 2 2 0  Altered sleeping - - - - - - -  Tired, decreased energy - - - - - - -  Change in appetite - - - - - - -  Feeling bad or failure about yourself  - - - - - - -  Trouble concentrating - - - - - - -  Moving slowly or fidgety/restless - - - - - - -  Suicidal thoughts - - - - - - -  PHQ-9 Score - - - - - - -  Difficult doing work/chores - - - - - - -    Review of Systems  Constitutional: Negative.   HENT: Negative.   Eyes: Negative.   Respiratory: Negative.   Cardiovascular: Negative.   Gastrointestinal: Negative.   Endocrine:  Negative.   Genitourinary: Negative.   Musculoskeletal: Positive for back pain, gait problem, neck pain and neck stiffness.  Skin: Negative.   Allergic/Immunologic: Negative.   Neurological: Positive for weakness.  Hematological: Negative.   Psychiatric/Behavioral: Positive for dysphoric mood.  All other systems reviewed and are negative.      Objective:   Physical Exam  Constitutional: He is oriented to person, place, and time. He appears well-developed and well-nourished.  HENT:  Head: Normocephalic and atraumatic.  Neck: Normal range of motion. Neck supple.  Cardiovascular: Normal rate and regular rhythm.  Pulmonary/Chest: Breath sounds normal.  Musculoskeletal:  Normal Muscle Bulk and Muscle Testing Reveals: Upper Extremities: Full ROM and Muscle Strength 5/5 Lumbar Hypersensitivity Lower Extremities: Full ROM and Muscle Strength 5/5 Arises from Table Slowly  Antalgic gait   Neurological: He is alert and oriented to person, place, and time.  Skin: Skin is warm and dry.  Nursing note and vitals reviewed.      Assessment and Plan  1. 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 He has lumbar degenerative disc disease. Continuecurrent medication regimen withGabapentin, Continue current Medication regimen. Schedule  with Dr. Dossie Arbour  for Lumbar Facet Radiofrequency Ablation with sedation, on 02/17/2018.  01/18/2018 Refilled: MS Contin30 mg one tablet every 12 hours #60 and Oxycodone 10mg  one tablet every 6 hours as needed for pain #120. We will continue the opioid monitoring program, this consists of regular clinic visits, examinations, urine drug screen, pill counts as well as use of [place] Controlled Substance reporting System. S/PLeft-sided Lumbar Facet Radiofrequency Ablation under fluoroscopic guidance with IV sedation on November 8,2017 withDr. Consuela Mimes, with good relief noted. 2. Sacroiliac Dysfunction: Continue with current  medication, heat and exercise regime. 01/18/2018 3. Muscle Spasm:No complaints Today.Continueto Monitor.01/18/2018  20 mminutes of face to face patient care time was spent during this visit. All questions were encouraged and answered.   F/U in 1 month.

## 2018-02-15 ENCOUNTER — Encounter: Payer: Self-pay | Admitting: Registered Nurse

## 2018-02-15 ENCOUNTER — Other Ambulatory Visit: Payer: Self-pay

## 2018-02-15 ENCOUNTER — Encounter: Payer: PPO | Attending: Registered Nurse | Admitting: Registered Nurse

## 2018-02-15 ENCOUNTER — Ambulatory Visit: Payer: PPO | Admitting: Physical Medicine & Rehabilitation

## 2018-02-15 VITALS — BP 118/78 | HR 72 | Ht 73.0 in | Wt 303.0 lb

## 2018-02-15 DIAGNOSIS — M79604 Pain in right leg: Secondary | ICD-10-CM | POA: Insufficient documentation

## 2018-02-15 DIAGNOSIS — M961 Postlaminectomy syndrome, not elsewhere classified: Secondary | ICD-10-CM

## 2018-02-15 DIAGNOSIS — M62830 Muscle spasm of back: Secondary | ICD-10-CM

## 2018-02-15 DIAGNOSIS — G894 Chronic pain syndrome: Secondary | ICD-10-CM | POA: Diagnosis not present

## 2018-02-15 DIAGNOSIS — Z79899 Other long term (current) drug therapy: Secondary | ICD-10-CM

## 2018-02-15 DIAGNOSIS — M5136 Other intervertebral disc degeneration, lumbar region: Secondary | ICD-10-CM | POA: Diagnosis not present

## 2018-02-15 DIAGNOSIS — M79605 Pain in left leg: Secondary | ICD-10-CM | POA: Diagnosis not present

## 2018-02-15 DIAGNOSIS — Z981 Arthrodesis status: Secondary | ICD-10-CM | POA: Diagnosis not present

## 2018-02-15 DIAGNOSIS — M5416 Radiculopathy, lumbar region: Secondary | ICD-10-CM

## 2018-02-15 DIAGNOSIS — Z5181 Encounter for therapeutic drug level monitoring: Secondary | ICD-10-CM | POA: Diagnosis not present

## 2018-02-15 DIAGNOSIS — G8928 Other chronic postprocedural pain: Secondary | ICD-10-CM | POA: Insufficient documentation

## 2018-02-15 DIAGNOSIS — M545 Low back pain: Secondary | ICD-10-CM | POA: Diagnosis not present

## 2018-02-15 MED ORDER — OXYCODONE HCL 10 MG PO TABS: 10.0000 mg | ORAL_TABLET | Freq: Four times a day (QID) | ORAL | 0 refills | Status: DC | PRN

## 2018-02-15 MED ORDER — GABAPENTIN 300 MG PO CAPS: ORAL_CAPSULE | ORAL | 2 refills | Status: DC

## 2018-02-15 MED ORDER — IBUPROFEN 800 MG PO TABS: 800.0000 mg | ORAL_TABLET | Freq: Three times a day (TID) | ORAL | 2 refills | Status: DC | PRN

## 2018-02-15 MED ORDER — MORPHINE SULFATE ER 30 MG PO TBCR: 30.0000 mg | EXTENDED_RELEASE_TABLET | Freq: Two times a day (BID) | ORAL | 0 refills | Status: DC

## 2018-02-15 NOTE — Progress Notes (Signed)
Subjective:    Patient ID: Samuel [name], male    DOB: 25-May-1976, 42 y.o.   MRN: [id number]  HPI: [name] is a 42 year old male who returns for follow up appointment for chronic pain and medication refill. He states his pain is located in his lower back radiating into his lower extremities. He rates his pain 6. Also reports increase intensity of back pain, he's scheduled for Lumbar radiofrequency with Dr. Dossie Arbour on 02/17/2018.   [name] is 120.00 MME. Last UDS was Performed on 10/14/2017, it was consistent.   Pain Inventory Average Pain 6 Pain Right Now 6 My pain is sharp, burning, dull, stabbing and aching  In the last 24 hours, has pain interfered with the following? General activity 8 Relation with others 9 Enjoyment of life 9 What TIME of day is your pain at its worst? morning evening night Sleep (in general) Fair  Pain is worse with: walking, bending, sitting and standing Pain improves with: rest and medication Relief from Meds: 5  Mobility walk with assistance use a cane how many minutes can you walk? 5 ability to climb steps?  yes do you drive?  yes  Function disabled: date disabled 2010 I need assistance with the following:  dressing, bathing, meal prep, household duties and shopping  Neuro/Psych weakness depression  Prior Studies Any changes since last visit?  no  Physicians involved in your care Any changes since last visit?  no   Family History  Problem Relation Age of Onset  . Hypertension Father    Social History   Socioeconomic History  . Marital status: Married    Spouse name: Not on file  . Number of children: Not on file  . Years of education: Not on file  . Highest education level: Not on file  Occupational History  . Not on file  Social Needs  . Financial resource strain: Not on file  . Food insecurity:    Worry: Not on file    Inability: Not on file  . Transportation needs:    Medical:  Not on file    Non-medical: Not on file  Tobacco Use  . Smoking status: Never Smoker  . Smokeless tobacco: Never Used  Substance and Sexual Activity  . Alcohol use: No    Alcohol/week: 0.0 standard drinks  . Drug use: No  . Sexual activity: Not on file  Lifestyle  . Physical activity:    Days per week: Not on file    Minutes per session: Not on file  . Stress: Not on file  Relationships  . Social connections:    Talks on phone: Not on file    Gets together: Not on file    Attends religious service: Not on file    Active member of club or organization: Not on file    Attends meetings of clubs or organizations: Not on file    Relationship status: Not on file  Other Topics Concern  . Not on file  Social History Narrative  . Not on file   Past Surgical History:  Procedure Laterality Date  . BACK SURGERY     lower back surgery x 2   Past Medical History:  Diagnosis Date  . Arthritis    "some in my back"  . GERD (gastroesophageal reflux disease)   . Headache(784.0)    BP 118/78   Pulse 72   Ht 6\' 1"  (1.854 m)   Wt (!) 303 lb (137.4  kg)   SpO2 94%   BMI 39.98 kg/m   Opioid Risk Score:   Fall Risk Score:  `1  Depression screen PHQ 2/9  Depression screen [facility] 2/9 02/15/2018 12/14/2017 03/26/2017 01/26/2017 10/28/2016 09/23/2016 07/27/2016  Decreased Interest 1 1 1 1  0 1 1  Down, Depressed, Hopeless 1 1 1 1  0 1 1  PHQ - 2 Score 2 2 2 2  0 2 2  Altered sleeping - - - - - - -  Tired, decreased energy - - - - - - -  Change in appetite - - - - - - -  Feeling bad or failure about yourself  - - - - - - -  Trouble concentrating - - - - - - -  Moving slowly or fidgety/restless - - - - - - -  Suicidal thoughts - - - - - - -  PHQ-9 Score - - - - - - -  Difficult doing work/chores - - - - - - -    Review of Systems  Constitutional: Negative.   HENT: Negative.   Eyes: Negative.   Respiratory: Negative.   Cardiovascular: Negative.   Gastrointestinal: Negative.   Endocrine:  Negative.   Genitourinary: Negative.   Musculoskeletal: Negative.   Skin: Negative.   Allergic/Immunologic: Negative.   Neurological: Negative.   Hematological: Negative.   Psychiatric/Behavioral: Negative.   All other systems reviewed and are negative.      Objective:   Physical Exam  Constitutional: He is oriented to person, place, and time. He appears well-developed and well-nourished.  HENT:  Head: Normocephalic and atraumatic.  Neck: Normal range of motion. Neck supple.  Cardiovascular: Normal rate and regular rhythm.  Pulmonary/Chest: Effort normal and breath sounds normal.  Musculoskeletal:  Normal Muscle Bulk and Muscle Testing Reveals: Upper Extremities: Decreased ROM 90 Degrees and Muscle Strength 4/5 Lumbar Paraspinal Tenderness: L-3-L-5 Lower Extremities: Decreased ROM and Muscle Strength 4/5 Bilateral Lower Extremities Flexion Produces Pain into his Back Arises from Table Slowly using cane for support Antalgic Gait  Neurological: He is alert and oriented to person, place, and time.  Skin: Skin is warm and dry.  Psychiatric: He has a normal mood and affect. His behavior is normal.  Nursing note and vitals reviewed.         Assessment & Plan:  1. 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 He has lumbar degenerative disc disease. Continuecurrent medication regimen withGabapentin, Continue current Medication regimen. Schedule  with Dr. Dossie Arbour  for Lumbar Facet Radiofrequency Ablation with sedation, on 02/17/2018. 02/15/2018 Refilled: MS Contin30 mg one tablet every 12 hours #60 and Oxycodone 10mg  one tablet every 6 hours as needed for pain #120. We will continue the opioid monitoring program, this consists of regular clinic visits, examinations, urine drug screen, pill counts as well as use of [place] Controlled Substance reporting System. S/PLeft-sided Lumbar Facet Radiofrequency Ablation under fluoroscopic guidance  with IV sedation on November 8,2017 withDr. Consuela Mimes, with good relief noted. 2. Sacroiliac Dysfunction: Continue with current medication, heat and exercise regime. 02/15/2018 3. Muscle Spasm:No complaints Today.Continueto Monitor.02/15/2018  20 mminutes of face to face patient care time was spent during this visit. All questions were encouraged and answered.   F/U in 1 month.

## 2018-02-17 ENCOUNTER — Encounter: Payer: Self-pay | Admitting: Pain Medicine

## 2018-02-17 ENCOUNTER — Ambulatory Visit
Admission: RE | Admit: 2018-02-17 | Discharge: 2018-02-17 | Disposition: A | Discharge status: Home / Self Care | Payer: PPO | Source: Ambulatory Visit | Attending: Pain Medicine | Admitting: Pain Medicine

## 2018-02-17 ENCOUNTER — Other Ambulatory Visit: Payer: Self-pay

## 2018-02-17 ENCOUNTER — Ambulatory Visit (HOSPITAL_BASED_OUTPATIENT_CLINIC_OR_DEPARTMENT_OTHER): Payer: PPO | Admitting: Pain Medicine

## 2018-02-17 VITALS — BP 113/78 | HR 68 | Temp 98.2°F | Resp 14 | Ht 73.0 in | Wt 315.0 lb

## 2018-02-17 DIAGNOSIS — M5136 Other intervertebral disc degeneration, lumbar region: Secondary | ICD-10-CM | POA: Diagnosis not present

## 2018-02-17 DIAGNOSIS — M47817 Spondylosis without myelopathy or radiculopathy, lumbosacral region: Secondary | ICD-10-CM | POA: Insufficient documentation

## 2018-02-17 DIAGNOSIS — G8929 Other chronic pain: Secondary | ICD-10-CM

## 2018-02-17 DIAGNOSIS — M47816 Spondylosis without myelopathy or radiculopathy, lumbar region: Secondary | ICD-10-CM

## 2018-02-17 DIAGNOSIS — M5442 Lumbago with sciatica, left side: Secondary | ICD-10-CM

## 2018-02-17 DIAGNOSIS — M961 Postlaminectomy syndrome, not elsewhere classified: Secondary | ICD-10-CM

## 2018-02-17 MED ORDER — FENTANYL CITRATE (PF) 100 MCG/2ML IJ SOLN
25.0000 ug | INTRAMUSCULAR | Status: DC | PRN
  Administered 2018-02-17: 100 ug via INTRAVENOUS
  Filled 2018-02-17: qty 2

## 2018-02-17 MED ORDER — ROPIVACAINE HCL 2 MG/ML IJ SOLN
9.0000 mL | Freq: Once | INTRAMUSCULAR | Status: AC
  Administered 2018-02-17: 9 mL via PERINEURAL
  Filled 2018-02-17: qty 10

## 2018-02-17 MED ORDER — TRIAMCINOLONE ACETONIDE 40 MG/ML IJ SUSP
40.0000 mg | Freq: Once | INTRAMUSCULAR | Status: AC
  Administered 2018-02-17: 40 mg
  Filled 2018-02-17: qty 1

## 2018-02-17 MED ORDER — LACTATED RINGERS IV SOLN
1000.0000 mL | Freq: Once | INTRAVENOUS | Status: AC
  Administered 2018-02-17: 1000 mL via INTRAVENOUS

## 2018-02-17 MED ORDER — LIDOCAINE HCL 2 % IJ SOLN
20.0000 mL | Freq: Once | INTRAMUSCULAR | Status: AC
  Administered 2018-02-17: 400 mg
  Filled 2018-02-17: qty 40

## 2018-02-17 MED ORDER — MIDAZOLAM HCL 5 MG/5ML IJ SOLN
1.0000 mg | INTRAMUSCULAR | Status: DC | PRN
  Administered 2018-02-17: 3 mg via INTRAVENOUS
  Filled 2018-02-17: qty 5

## 2018-02-17 NOTE — Progress Notes (Signed)
Patient's Name: Samuel [name]  MRN: [id number]  Referring Provider: No ref. provider found  DOB: Dec 07, 1975  PCP: System, Pcp Not In  DOS: 02/17/2018  Note by: Gaspar Cola, MD  Service setting: Ambulatory outpatient  Specialty: Interventional Pain Management  Patient type: Established  Location: [facility] (AMB) Pain Management Facility  Visit type: Interventional Procedure   Primary Reason for Visit: Interventional Pain Management Treatment. CC: Back Pain (low)  Procedure:          Anesthesia, Analgesia, Anxiolysis:  Type: Thermal Lumbar Facet, Medial Branch Radiofrequency Ablation/Neurotomy  #2 (last done on 12/24/2015) Primary Purpose: Palliative Region: Posterolateral Lumbosacral Spine Level: L2, L3, L4, L5, & S1 Medial Branch Level(s). These levels will denervate the L3-4, L4-5, and the L5-S1 lumbar facet joints. Laterality: Right  Type: Moderate (Conscious) Sedation combined with Local Anesthesia Indication(s): Analgesia and Anxiety Route: Intravenous (IV) IV Access: Secured Sedation: Meaningful verbal contact was maintained at all times during the procedure  Local Anesthetic: Lidocaine 1-2%  Position: Prone   Indications: 1. Spondylosis without myelopathy or radiculopathy, lumbosacral region   2. Lumbar facet syndrome (Primary Source of Pain) (Bilateral) (R>L)   3. Chronic low back pain (Primary Source of Pain) (Bilateral) (R>L)   4. DDD (degenerative disc disease), lumbar   5. 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 Wissner has been dealing with the above chronic pain for longer than three months and has either failed to respond, was unable to tolerate, or simply did not get enough benefit from other more conservative therapies including, but not limited to: 1. Over-the-counter medications 2. Anti-inflammatory medications 3. Muscle relaxants 4. Membrane  stabilizers 5. Opioids 6. Physical therapy and/or chiropractic manipulation 7. Modalities (Heat, ice, etc.) 8. Invasive techniques such as nerve blocks. Mr. Mcquigg has attained more than 50% relief of the pain from a series of diagnostic injections conducted in separate occasions.  Pain Score: Pre-procedure: 7 /10 Post-procedure: 3 /10  Pre-op Assessment:  Mr. Bells is a 42 y.o. (year old), male patient, seen today for interventional treatment. He  has a past surgical history that includes Back surgery. Mr. Boeke has a current medication list which includes the following prescription(s): gabapentin, ibuprofen, morphine, OVER THE COUNTER MEDICATION, and oxycodone hcl, and the following Facility-Administered Medications: fentanyl and midazolam. His primarily concern today is the Back Pain (low)  Initial Vital Signs:  Pulse/HCG Rate: 68ECG Heart Rate: 71 Temp: 97.9 F (36.6 C) Resp: 18 BP: 137/80 SpO2: 100 %  BMI: Estimated body mass index is 41.56 kg/m as calculated from the following:   Height as of this encounter: 6\' 1"  (1.854 m).   Weight as of this encounter: 315 lb (142.9 kg).  Risk Assessment: Allergies: Reviewed. He has No Known Allergies.  Allergy Precautions: None required Coagulopathies: Reviewed. None identified.  Blood-thinner therapy: None at this time Active Infection(s): Reviewed. None identified. Mr. Slomski is afebrile  Site Confirmation: Mr. Mance was asked to confirm the procedure and laterality before marking the site Procedure checklist: Completed Consent: Before the procedure and under the influence of no sedative(s), amnesic(s), or anxiolytics, the patient was informed of the treatment options, risks and possible complications. To fulfill our ethical and legal obligations, as recommended by the American Medical Association's Code of Ethics, I have informed the patient of my clinical impression; the nature and purpose of the treatment or procedure; the risks, benefits,  and  possible complications of the intervention; the alternatives, including doing nothing; the risk(s) and benefit(s) of the alternative treatment(s) or procedure(s); and the risk(s) and benefit(s) of doing nothing. The patient was provided information about the general risks and possible complications associated with the procedure. These may include, but are not limited to: failure to achieve desired goals, infection, bleeding, organ or nerve damage, allergic reactions, paralysis, and death. In addition, the patient was informed of those risks and complications associated to Spine-related procedures, such as failure to decrease pain; infection (i.e.: Meningitis, epidural or intraspinal abscess); bleeding (i.e.: epidural hematoma, subarachnoid hemorrhage, or any other type of intraspinal or peri-dural bleeding); organ or nerve damage (i.e.: Any type of peripheral nerve, nerve root, or spinal cord injury) with subsequent damage to sensory, motor, and/or autonomic systems, resulting in permanent pain, numbness, and/or weakness of one or several areas of the body; allergic reactions; (i.e.: anaphylactic reaction); and/or death. Furthermore, the patient was informed of those risks and complications associated with the medications. These include, but are not limited to: allergic reactions (i.e.: anaphylactic or anaphylactoid reaction(s)); adrenal axis suppression; blood sugar elevation that in diabetics may result in ketoacidosis or comma; water retention that in patients with history of congestive heart failure may result in shortness of breath, pulmonary edema, and decompensation with resultant heart failure; weight gain; swelling or edema; medication-induced neural toxicity; particulate matter embolism and blood vessel occlusion with resultant organ, and/or nervous system infarction; and/or aseptic necrosis of one or more joints. Finally, the patient was informed that Medicine is not an exact science; therefore,  there is also the possibility of unforeseen or unpredictable risks and/or possible complications that may result in a catastrophic outcome. The patient indicated having understood very clearly. We have given the patient no guarantees and we have made no promises. Enough time was given to the patient to ask questions, all of which were answered to the patient's satisfaction. Mr. Stapel has indicated that he wanted to continue with the procedure. Attestation: I, the ordering provider, attest that I have discussed with the patient the benefits, risks, side-effects, alternatives, likelihood of achieving goals, and potential problems during recovery for the procedure that I have provided informed consent. Date  Time: 02/17/2018 10:22 AM  Pre-Procedure Preparation:  Monitoring: As per clinic protocol. Respiration, ETCO2, SpO2, BP, heart rate and rhythm monitor placed and checked for adequate function Safety Precautions: Patient was assessed for positional comfort and pressure points before starting the procedure. Time-out: I initiated and conducted the "Time-out" before starting the procedure, as per protocol. The patient was asked to participate by confirming the accuracy of the "Time Out" information. Verification of the correct person, site, and procedure were performed and confirmed by me, the nursing staff, and the patient. "Time-out" conducted as per Joint Commission's Universal Protocol (UP.01.01.01). Time: 1054  Description of Procedure:          Laterality: Right Levels:  L2, L3, L4, L5, & S1 Medial Branch Level(s), at the L3-4, L4-5, and the L5-S1 lumbar facet joints. Area Prepped: Lumbosacral Prepping solution: ChloraPrep (2% chlorhexidine gluconate and 70% isopropyl alcohol) Safety Precautions: Aspiration looking for blood return was conducted prior to all injections. At no point did we inject any substances, as a needle was being advanced. Before injecting, the patient was told to immediately  notify me if he was experiencing any new onset of "ringing in the ears, or metallic taste in the mouth". No attempts were made at seeking any paresthesias. Safe injection practices and needle  disposal techniques used. Medications properly checked for expiration dates. SDV (single dose vial) medications used. After the completion of the procedure, all disposable equipment used was discarded in the proper designated medical waste containers. Local Anesthesia: Protocol guidelines were followed. The patient was positioned over the fluoroscopy table. The area was prepped in the usual manner. The time-out was completed. The target area was identified using fluoroscopy. A 12-in long, straight, sterile hemostat was used with fluoroscopic guidance to locate the targets for each level blocked. Once located, the skin was marked with an approved surgical skin marker. Once all sites were marked, the skin (epidermis, dermis, and hypodermis), as well as deeper tissues (fat, connective tissue and muscle) were infiltrated with a small amount of a short-acting local anesthetic, loaded on a 10cc syringe with a 25G, 1.5-in  Needle. An appropriate amount of time was allowed for local anesthetics to take effect before proceeding to the next step. Local Anesthetic: Lidocaine 2.0% The unused portion of the local anesthetic was discarded in the proper designated containers. Technical explanation of process:  Radiofrequency Ablation (RFA) L2 Medial Branch Nerve RFA: The target area for the L2 medial branch is at the junction of the postero-lateral aspect of the superior articular process and the superior, posterior, and medial edge of the transverse process of L3. Under fluoroscopic guidance, a Radiofrequency needle was inserted until contact was made with os over the superior postero-lateral aspect of the pedicular shadow (target area). Sensory and motor testing was conducted to properly adjust the position of the needle. Once  satisfactory placement of the needle was achieved, the numbing solution was slowly injected after negative aspiration for blood. 2.0 mL of the nerve block solution was injected without difficulty or complication. After waiting for at least 3 minutes, the ablation was performed. Once completed, the needle was removed intact. L3 Medial Branch Nerve RFA: The target area for the L3 medial branch is at the junction of the postero-lateral aspect of the superior articular process and the superior, posterior, and medial edge of the transverse process of L4. Under fluoroscopic guidance, a Radiofrequency needle was inserted until contact was made with os over the superior postero-lateral aspect of the pedicular shadow (target area). Sensory and motor testing was conducted to properly adjust the position of the needle. Once satisfactory placement of the needle was achieved, the numbing solution was slowly injected after negative aspiration for blood. 2.0 mL of the nerve block solution was injected without difficulty or complication. After waiting for at least 3 minutes, the ablation was performed. Once completed, the needle was removed intact. L4 Medial Branch Nerve RFA: The target area for the L4 medial branch is at the junction of the postero-lateral aspect of the superior articular process and the superior, posterior, and medial edge of the transverse process of L5. Under fluoroscopic guidance, a Radiofrequency needle was inserted until contact was made with os over the superior postero-lateral aspect of the pedicular shadow (target area). Sensory and motor testing was conducted to properly adjust the position of the needle. Once satisfactory placement of the needle was achieved, the numbing solution was slowly injected after negative aspiration for blood. 2.0 mL of the nerve block solution was injected without difficulty or complication. After waiting for at least 3 minutes, the ablation was performed. Once completed,  the needle was removed intact. L5 Medial Branch Nerve RFA: The target area for the L5 medial branch is at the junction of the postero-lateral aspect of the superior articular process of S1  and the superior, posterior, and medial edge of the sacral ala. Under fluoroscopic guidance, a Radiofrequency needle was inserted until contact was made with os over the superior postero-lateral aspect of the pedicular shadow (target area). Sensory and motor testing was conducted to properly adjust the position of the needle. Once satisfactory placement of the needle was achieved, the numbing solution was slowly injected after negative aspiration for blood. 2.0 mL of the nerve block solution was injected without difficulty or complication. After waiting for at least 3 minutes, the ablation was performed. Once completed, the needle was removed intact. S1 Medial Branch Nerve RFA: The target area for the S1 medial branch is located inferior to the junction of the S1 superior articular process and the L5 inferior articular process, posterior, inferior, and lateral to the 6 o'clock position of the L5-S1 facet joint, just superior to the S1 posterior foramen. Under fluoroscopic guidance, the Radiofrequency needle was advanced until contact was made with os over the Target area. Sensory and motor testing was conducted to properly adjust the position of the needle. Once satisfactory placement of the needle was achieved, the numbing solution was slowly injected after negative aspiration for blood. 2.0 mL of the nerve block solution was injected without difficulty or complication. After waiting for at least 3 minutes, the ablation was performed. Once completed, the needle was removed intact. Radiofrequency lesioning (ablation):  Radiofrequency Generator: NeuroTherm [id number] Sensory Stimulation Parameters: 50 Hz was used to locate & identify the nerve, making sure that the needle was positioned such that there was no sensory stimulation  below 0.3 V or above 0.7 V. Motor Stimulation Parameters: 2 Hz was used to evaluate the motor component. Care was taken not to lesion any nerves that demonstrated motor stimulation of the lower extremities at an output of less than 2.5 times that of the sensory threshold, or a maximum of 2.0 V. Lesioning Technique Parameters: Standard Radiofrequency settings. (Not bipolar or pulsed.) Temperature Settings: 80 degrees C Lesioning time: 60 seconds Intra-operative Compliance: Compliant Materials & Medications: Needle(s) (Electrode/Cannula) Type: Teflon-coated, curved tip, Radiofrequency needle(s) Gauge: 22G Length: 10cm Numbing solution: 0.2% PF-Ropivacaine + Triamcinolone (40 mg/mL) diluted to a final concentration of 4 mg of Triamcinolone/mL of Ropivacaine The unused portion of the solution was discarded in the proper designated containers.  Once the entire procedure was completed, the treated area was cleaned, making sure to leave some of the prepping solution back to take advantage of its long term bactericidal properties.  Illustration of the posterior view of the lumbar spine and the posterior neural structures. Laminae of L2 through S1 are labeled. DPRL5, dorsal primary ramus of L5; DPRS1, dorsal primary ramus of S1; DPR3, dorsal primary ramus of L3; FJ, facet (zygapophyseal) joint L3-L4; I, inferior articular process of L4; LB1, lateral branch of dorsal primary ramus of L1; IAB, inferior articular branches from L3 medial branch (supplies L4-L5 facet joint); IBP, intermediate branch plexus; MB3, medial branch of dorsal primary ramus of L3; NR3, third lumbar nerve root; S, superior articular process of L5; SAB, superior articular branches from L4 (supplies L4-5 facet joint also); TP3, transverse process of L3.  Vitals:   02/17/18 1129 02/17/18 1139 02/17/18 1149 02/17/18 1159  BP: (!) 124/106 121/64 (!) 124/59 113/78  Pulse:      Resp: 14 16 16 14   Temp:  98.3 F (36.8 C)  98.2 F (36.8 C)    SpO2: 96% 99% 98% 100%  Weight:      Height:  Start Time: 1054 hrs. End Time: 1129 hrs.  Imaging Guidance (Spinal):          Type of Imaging Technique: Fluoroscopy Guidance (Spinal) Indication(s): Assistance in needle guidance and placement for procedures requiring needle placement in or near specific anatomical locations not easily accessible without such assistance. Exposure Time: Please see nurses notes. Contrast: None used. Fluoroscopic Guidance: I was personally present during the use of fluoroscopy. "Tunnel Vision Technique" used to obtain the best possible view of the target area. Parallax error corrected before commencing the procedure. "Direction-depth-direction" technique used to introduce the needle under continuous pulsed fluoroscopy. Once target was reached, antero-posterior, oblique, and lateral fluoroscopic projection used confirm needle placement in all planes. Images permanently stored in EMR. Interpretation: No contrast injected. I personally interpreted the imaging intraoperatively. Adequate needle placement confirmed in multiple planes. Permanent images saved into the patient's record.  Antibiotic Prophylaxis:   Anti-infectives (From admission, onward)   None     Indication(s): None identified  Post-operative Assessment:  Post-procedure Vital Signs:  Pulse/HCG Rate: 6866 Temp: 98.2 F (36.8 C) Resp: 14 BP: 113/78 SpO2: 100 %  EBL: None  Complications: No immediate post-treatment complications observed by team, or reported by patient.  Note: The patient tolerated the entire procedure well. A repeat set of vitals were taken after the procedure and the patient was kept under observation following institutional policy, for this type of procedure. Post-procedural neurological assessment was performed, showing return to baseline, prior to discharge. The patient was provided with post-procedure discharge instructions, including a section on how to identify  potential problems. Should any problems arise concerning this procedure, the patient was given instructions to immediately contact us, at any time, without hesitation. In any case, we plan to contact the patient by telephone for a follow-up status report regarding this interventional procedure.  Comments:  No additional relevant information.  Plan of Care    Imaging Orders     DG C-Arm 1-60 Min-No Report  Procedure Orders     Radiofrequency,Lumbar     Radiofrequency,Lumbar  Medications ordered for procedure: Meds ordered this encounter  Medications  . lidocaine (XYLOCAINE) 2 % (with pres) injection 400 mg  . midazolam (VERSED) 5 MG/5ML injection 1-2 mg    Make sure Flumazenil is available in the pyxis when using this medication. If oversedation occurs, administer 0.2 mg IV over 15 sec. If after 45 sec no response, administer 0.2 mg again over 1 min; may repeat at 1 min intervals; not to exceed 4 doses (1 mg)  . fentaNYL (SUBLIMAZE) injection 25-50 mcg    Make sure Narcan is available in the pyxis when using this medication. In the event of respiratory depression (RR< 8/min): Titrate NARCAN (naloxone) in increments of 0.1 to 0.2 mg IV at 2-3 minute intervals, until desired degree of reversal.  . lactated ringers infusion 1,000 mL  . ropivacaine (PF) 2 mg/mL (0.2%) (NAROPIN) injection 9 mL  . triamcinolone acetonide (KENALOG-40) injection 40 mg   Medications administered: We administered lidocaine, midazolam, fentaNYL, lactated ringers, ropivacaine (PF) 2 mg/mL (0.2%), and triamcinolone acetonide.  See the medical record for exact dosing, route, and time of administration.  New Prescriptions   No medications on file   Disposition: Discharge home  Discharge Date & Time: 02/17/2018; 1104 hrs.   Physician-requested Follow-up: Return for contralateral RFA (2 wks): (L) L-FCT RFA #2.  Future Appointments  Date Time Provider [place]  03/15/2018 10:00 AM [facility]  [facility] None  03/15/2018 10:15 AM  Charlett Blake, MD [facility] None  04/05/2018 10:30 AM Milinda Pointer, MD [facility] None  04/12/2018 10:20 AM Bayard Hugger, NP [facility] CPR   Primary Care Physician: System, Pcp Not In Location: [facility] Note by: Gaspar Cola, MD Date: 02/17/2018; Time: 12:08 PM  Disclaimer:  Medicine is not an Chief Strategy Officer. The only guarantee in medicine is that nothing is guaranteed. It is important to note that the decision to proceed with this intervention was based on the information collected from the patient. The Data and conclusions were drawn from the patient's questionnaire, the interview, and the physical examination. Because the information was provided in large part by the patient, it cannot be guaranteed that it has not been purposely or unconsciously manipulated. Every effort has been made to obtain as much relevant data as possible for this evaluation. It is important to note that the conclusions that lead to this procedure are derived in large part from the available data. Always take into account that the treatment will also be dependent on availability of resources and existing treatment guidelines, considered by other Pain Management Practitioners as being common knowledge and practice, at the time of the intervention. For Medico-Legal purposes, it is also important to point out that variation in procedural techniques and pharmacological choices are the acceptable norm. The indications, contraindications, technique, and results of the above procedure should only be interpreted and judged by a Board-Certified Interventional Pain Specialist with extensive familiarity and expertise in the same exact procedure and technique.

## 2018-02-17 NOTE — Patient Instructions (Signed)
___________________________________________________________________________________________  Post-Radiofrequency (RF) Discharge Instructions  You have just completed a Radiofrequency Neurotomy.  The following instructions will provide you with information and guidelines for self-care upon discharge.  If at any time you have questions or concerns please call your physician. DO NOT DRIVE YOURSELF!!  Instructions:  Apply ice: Fill a plastic sandwich bag with crushed ice. Cover it with a small towel and apply to injection site. Apply for 15 minutes then remove x 15 minutes. Repeat sequence on day of procedure, until you go to bed. The purpose is to minimize swelling and discomfort after procedure.  Apply heat: Apply heat to procedure site starting the day following the procedure. The purpose is to treat any soreness and discomfort from the procedure.  Food intake: No eating limitations, unless stipulated above.  Nevertheless, if you have had sedation, you may experience some nausea.  In this case, it may be wise to wait at least two hours prior to resuming regular diet.  Physical activities: Keep activities to a minimum for the first 8 hours after the procedure. For the first 24 hours after the procedure, do not drive a motor vehicle,  Operate heavy machinery, power tools, or handle any weapons.  Consider walking with the use of an assistive device or accompanied by an adult for the first 24 hours.  Do not drink alcoholic beverages including beer.  Do not make any important decisions or sign any legal documents. Go home and rest today.  Resume activities tomorrow, as tolerated.  Use caution in moving about as you may experience mild leg weakness.  Use caution in cooking, use of household electrical appliances and climbing steps.  Driving: If you have received any sedation, you are not allowed to drive for 24 hours after your procedure.  Blood thinner: Restart your blood thinner 6 hours after your  procedure. (Only for those taking blood thinners)  Insulin: As soon as you can eat, you may resume your normal dosing schedule. (Only for those taking insulin)  Medications: May resume pre-procedure medications.  Do not take any drugs, other than what has been prescribed to you.  Infection prevention: Keep procedure site clean and dry.  Post-procedure Pain Diary: Extremely important that this be done correctly and accurately. Recorded information will be used to determine the next step in treatment.  Pain evaluated is that of treated area only. Do not include pain from an untreated area.  Complete every hour, on the hour, for the initial 8 hours. Set an alarm to help you do this part accurately.  Do not go to sleep and have it completed later. It will not be accurate.  Follow-up appointment: Keep your follow-up appointment after the procedure. Usually 2 weeks for most procedures. (6 weeks in the case of radiofrequency.) Bring you pain diary.   Expect:  From numbing medicine (AKA: Local Anesthetics): Numbness or decrease in pain.  Onset: Full effect within 15 minutes of injected.  Duration: It will depend on the type of local anesthetic used. On the average, 1 to 8 hours.   From steroids: Decrease in swelling or inflammation. Once inflammation is improved, relief of the pain will follow.  Onset of benefits: Depends on the amount of swelling present. The more swelling, the longer it will take for the benefits to be seen. In some cases, up to 10 days.  Duration: Steroids will stay in the system x 2 weeks. Duration of benefits will depend on multiple posibilities including persistent irritating factors.  From procedure: Some   discomfort is to be expected once the numbing medicine wears off. This should be minimal if ice and heat are applied as instructed.  Call if:  You experience numbness and weakness that gets worse with time, as opposed to wearing off.  He experience any unusual  bleeding, difficulty breathing, or loss of the ability to control your bowel and bladder. (This applies to Spinal procedures only)  You experience any redness, swelling, heat, red streaks, elevated temperature, fever, or any other signs of a possible infection.  Emergency Numbers:  Durning business hours (Monday - Thursday, 8:00 AM - 4:00 PM) (Friday, 9:00 AM - 12:00 Noon): [phone]  After hours: [phone] ____________________________________________________________________________________________   ____________________________________________________________________________________________  Preparing for Procedure with Sedation  Instructions: . Oral Intake: Do not eat or drink anything for at least 8 hours prior to your procedure. . Transportation: Public transportation is not allowed. Bring an adult driver. The driver must be physically present in our waiting room before any procedure can be started. . Physical Assistance: Bring an adult physically capable of assisting you, in the event you need help. This adult should keep you company at home for at least 6 hours after the procedure. . Blood Pressure Medicine: Take your blood pressure medicine with a sip of water the morning of the procedure. . Blood thinners: Notify our staff if you are taking any blood thinners. Depending on which one you take, there will be specific instructions on how and when to stop it. . Diabetics on insulin: Notify the staff so that you can be scheduled 1st case in the morning. If your diabetes requires high dose insulin, take only  of your normal insulin dose the morning of the procedure and notify the staff that you have done so. . Preventing infections: Shower with an antibacterial soap the morning of your procedure. . Build-up your immune system: Take 1000 mg of Vitamin C with every meal (3 times a day) the day prior to your procedure. . Antibiotics: Inform the staff if you have a condition or  reason that requires you to take antibiotics before dental procedures. . Pregnancy: If you are pregnant, call and cancel the procedure. . Sickness: If you have a cold, fever, or any active infections, call and cancel the procedure. . Arrival: You must be in the facility at least 30 minutes prior to your scheduled procedure. . Children: Do not bring children with you. . Dress appropriately: Bring dark clothing that you would not mind if they get stained. . Valuables: Do not bring any jewelry or valuables.  Procedure appointments are reserved for interventional treatments only. . No Prescription Refills. . No medication changes will be discussed during procedure appointments. . No disability issues will be discussed.  Reasons to call and reschedule or cancel your procedure: (Following these recommendations will minimize the risk of a serious complication.) . Surgeries: Avoid having procedures within 2 weeks of any surgery. (Avoid for 2 weeks before or after any surgery). . Flu Shots: Avoid having procedures within 2 weeks of a flu shots or . (Avoid for 2 weeks before or after immunizations). . Barium: Avoid having a procedure within 7-10 days after having had a radiological study involving the use of radiological contrast. (Myelograms, Barium swallow or enema study). . Heart attacks: Avoid any elective procedures or surgeries for the initial 6 months after a "Myocardial Infarction" (Heart Attack). . Blood thinners: It is imperative that you stop these medications before procedures. Let us know if you if you   take any blood thinner.  . Infection: Avoid procedures during or within two weeks of an infection (including chest colds or gastrointestinal problems). Symptoms associated with infections include: Localized redness, fever, chills, night sweats or profuse sweating, burning sensation when voiding, cough, congestion, stuffiness, runny nose, sore throat, diarrhea, nausea, vomiting, cold or Flu  symptoms, recent or current infections. It is specially important if the infection is over the area that we intend to treat. Marland Kitchen Heart and lung problems: Symptoms that may suggest an active cardiopulmonary problem include: cough, chest pain, breathing difficulties or shortness of breath, dizziness, ankle swelling, uncontrolled high or unusually low blood pressure, and/or palpitations. If you are experiencing any of these symptoms, cancel your procedure and contact your primary care physician for an evaluation.  Remember:  Regular Business hours are:  Monday to Thursday 8:00 AM to 4:00 PM  Provider's Schedule: Milinda Pointer, MD:  Procedure days: Tuesday and Thursday 7:30 AM to 4:00 PM  Gillis Santa, MD:  Procedure days: Monday and Wednesday 7:30 AM to 4:00 PM ____________________________________________________________________________________________   ____________________________________________________________________________________________  Pain Scale  Introduction: The pain score used by this practice is the Verbal Numerical Rating Scale (VNRS-11). This is an 11-point scale. It is for adults and children 10 years or older. There are significant differences in how the pain score is reported, used, and applied. Forget everything you learned in the past and learn this scoring system.  General Information: The scale should reflect your current level of pain. Unless you are specifically asked for the level of your worst pain, or your average pain. If you are asked for one of these two, then it should be understood that it is over the past 24 hours.  Basic Activities of Daily Living (ADL): Personal hygiene, dressing, eating, transferring, and using restroom.  Instructions: Most patients tend to report their level of pain as a combination of two factors, their physical pain and their psychosocial pain. This last one is also known as "suffering" and it is reflection of how physical pain  affects you socially and psychologically. From now on, report them separately. From this point on, when asked to report your pain level, report only your physical pain. Use the following table for reference.  Pain Clinic Pain Levels (0-5/10)  Pain Level Score  Description  No Pain 0   Mild pain 1 Nagging, annoying, but does not interfere with basic activities of daily living (ADL). Patients are able to eat, bathe, get dressed, toileting (being able to get on and off the toilet and perform personal hygiene functions), transfer (move in and out of bed or a chair without assistance), and maintain continence (able to control bladder and bowel functions). Blood pressure and heart rate are unaffected. A normal heart rate for a healthy adult ranges from 60 to 100 bpm (beats per minute).   Mild to moderate pain 2 Noticeable and distracting. Impossible to hide from other people. More frequent flare-ups. Still possible to adapt and function close to normal. It can be very annoying and may have occasional stronger flare-ups. With discipline, patients may get used to it and adapt.   Moderate pain 3 Interferes significantly with activities of daily living (ADL). It becomes difficult to feed, bathe, get dressed, get on and off the toilet or to perform personal hygiene functions. Difficult to get in and out of bed or a chair without assistance. Very distracting. With effort, it can be ignored when deeply involved in activities.   Moderately severe pain 4 Impossible  to ignore for more than a few minutes. With effort, patients may still be able to manage work or participate in some social activities. Very difficult to concentrate. Signs of autonomic nervous system discharge are evident: dilated pupils (mydriasis); mild sweating (diaphoresis); sleep interference. Heart rate becomes elevated (>115 bpm). Diastolic blood pressure (lower number) rises above 100 mmHg. Patients find relief in laying down and not moving.    Severe pain 5 Intense and extremely unpleasant. Associated with frowning face and frequent crying. Pain overwhelms the senses.  Ability to do any activity or maintain social relationships becomes significantly limited. Conversation becomes difficult. Pacing back and forth is common, as getting into a comfortable position is nearly impossible. Pain wakes you up from deep sleep. Physical signs will be obvious: pupillary dilation; increased sweating; goosebumps; brisk reflexes; cold, clammy hands and feet; nausea, vomiting or dry heaves; loss of appetite; significant sleep disturbance with inability to fall asleep or to remain asleep. When persistent, significant weight loss is observed due to the complete loss of appetite and sleep deprivation.  Blood pressure and heart rate becomes significantly elevated. Caution: If elevated blood pressure triggers a pounding headache associated with blurred vision, then the patient should immediately seek attention at an urgent or emergency care unit, as these may be signs of an impending stroke.    Emergency Department Pain Levels (6-10/10)  Emergency Room Pain 6 Severely limiting. Requires emergency care and should not be seen or managed at an outpatient pain management facility. Communication becomes difficult and requires great effort. Assistance to reach the emergency department may be required. Facial flushing and profuse sweating along with potentially dangerous increases in heart rate and blood pressure will be evident.   Distressing pain 7 Self-care is very difficult. Assistance is required to transport, or use restroom. Assistance to reach the emergency department will be required. Tasks requiring coordination, such as bathing and getting dressed become very difficult.   Disabling pain 8 Self-care is no longer possible. At this level, pain is disabling. The individual is unable to do even the most "basic" activities such as walking, eating, bathing, dressing,  transferring to a bed, or toileting. Fine motor skills are lost. It is difficult to think clearly.   Incapacitating pain 9 Pain becomes incapacitating. Thought processing is no longer possible. Difficult to remember your own name. Control of movement and coordination are lost.   The worst pain imaginable 10 At this level, most patients pass out from pain. When this level is reached, collapse of the autonomic nervous system occurs, leading to a sudden drop in blood pressure and heart rate. This in turn results in a temporary and dramatic drop in blood flow to the brain, leading to a loss of consciousness. Fainting is one of the body's self defense mechanisms. Passing out puts the brain in a calmed state and causes it to shut down for a while, in order to begin the healing process.    Summary: 1. Refer to this scale when providing [place] with your pain level. 2. Be accurate and careful when reporting your pain level. This will help with your care. 3. Over-reporting your pain level will lead to loss of credibility. 4. Even a level of 1/10 means that there is pain and will be treated at our facility. 5. High, inaccurate reporting will be documented as "Symptom Exaggeration", leading to loss of credibility and suspicions of possible secondary gains such as obtaining more narcotics, or wanting to appear disabled, for fraudulent reasons. 6. Only  pain levels of 5 or below will be seen at our facility. 7. Pain levels of 6 and above will be sent to the Emergency Department and the appointment cancelled. ____________________________________________________________________________________________

## 2018-02-18 ENCOUNTER — Telehealth: Payer: Self-pay

## 2018-02-18 NOTE — Telephone Encounter (Signed)
Post procedure phone call.  Patient states he is doing better.

## 2018-03-15 ENCOUNTER — Ambulatory Visit (HOSPITAL_BASED_OUTPATIENT_CLINIC_OR_DEPARTMENT_OTHER): Payer: PPO | Admitting: Physical Medicine & Rehabilitation

## 2018-03-15 ENCOUNTER — Encounter: Payer: PPO | Attending: Registered Nurse

## 2018-03-15 ENCOUNTER — Encounter: Payer: Self-pay | Admitting: Physical Medicine & Rehabilitation

## 2018-03-15 VITALS — BP 117/75 | HR 71 | Resp 14 | Ht 73.0 in | Wt 303.0 lb

## 2018-03-15 DIAGNOSIS — M961 Postlaminectomy syndrome, not elsewhere classified: Secondary | ICD-10-CM

## 2018-03-15 DIAGNOSIS — Z981 Arthrodesis status: Secondary | ICD-10-CM | POA: Diagnosis not present

## 2018-03-15 DIAGNOSIS — M79604 Pain in right leg: Secondary | ICD-10-CM | POA: Insufficient documentation

## 2018-03-15 DIAGNOSIS — M5136 Other intervertebral disc degeneration, lumbar region: Secondary | ICD-10-CM | POA: Diagnosis not present

## 2018-03-15 DIAGNOSIS — M545 Low back pain: Secondary | ICD-10-CM | POA: Insufficient documentation

## 2018-03-15 DIAGNOSIS — M79605 Pain in left leg: Secondary | ICD-10-CM | POA: Diagnosis not present

## 2018-03-15 DIAGNOSIS — G8928 Other chronic postprocedural pain: Secondary | ICD-10-CM | POA: Insufficient documentation

## 2018-03-15 MED ORDER — OXYCODONE HCL 10 MG PO TABS: 10.0000 mg | ORAL_TABLET | Freq: Four times a day (QID) | ORAL | 0 refills | Status: DC | PRN

## 2018-03-15 MED ORDER — MORPHINE SULFATE ER 30 MG PO TBCR: 30.0000 mg | EXTENDED_RELEASE_TABLET | Freq: Two times a day (BID) | ORAL | 0 refills | Status: DC

## 2018-03-15 NOTE — Progress Notes (Signed)
Subjective:    Patient ID: Samuel [name], male    DOB: [birth year], 42 y.o.   MRN: [id number]  HPI  Doing well after Right RFA L3-4-5 S1, Left side pain more apparent.  Plans on scheduleng Left sided RFA L3-4-5-S1 Taking OTC stool softener for constipation We discussed other narcotics that are less constipating but overall pt states he is happy with current regimen He does indicate that his dentist attributes his chronic dry mouth to his narcotic analgesics. Pain Inventory Average Pain 6 Pain Right Now 6 My pain is sharp, dull, stabbing and aching  In the last 24 hours, has pain interfered with the following? General activity 8 Relation with others 8 Enjoyment of life 9 What TIME of day is your pain at its worst? morning, evening, night Sleep (in general) Fair  Pain is worse with: walking, bending, sitting and standing Pain improves with: rest and medication Relief from Meds: 5  Mobility walk with assistance use a cane how many minutes can you walk? 5 ability to climb steps?  yes do you drive?  yes Do you have any goals in this area?  no  Function disabled: date disabled . I need assistance with the following:  dressing, bathing, meal prep, household duties and shopping  Neuro/Psych weakness depression  Prior Studies Any changes since last visit?  no  Physicians involved in your care Any changes since last visit?  no   Family History  Problem Relation Age of Onset  . Hypertension Father    Social History   Socioeconomic History  . Marital status: Married    Spouse name: Not on file  . Number of children: Not on file  . Years of education: Not on file  . Highest education level: Not on file  Occupational History  . Not on file  Social Needs  . Financial resource strain: Not on file  . Food insecurity:    Worry: Not on file    Inability: Not on file  . Transportation needs:    Medical: Not on file    Non-medical: Not on file  Tobacco Use  .  Smoking status: Never Smoker  . Smokeless tobacco: Never Used  Substance and Sexual Activity  . Alcohol use: No    Alcohol/week: 0.0 standard drinks  . Drug use: No  . Sexual activity: Not on file  Lifestyle  . Physical activity:    Days per week: Not on file    Minutes per session: Not on file  . Stress: Not on file  Relationships  . Social connections:    Talks on phone: Not on file    Gets together: Not on file    Attends religious service: Not on file    Active member of club or organization: Not on file    Attends meetings of clubs or organizations: Not on file    Relationship status: Not on file  Other Topics Concern  . Not on file  Social History Narrative  . Not on file   Past Surgical History:  Procedure Laterality Date  . BACK SURGERY     lower back surgery x 2   Past Medical History:  Diagnosis Date  . Arthritis    "some in my back"  . GERD (gastroesophageal reflux disease)   . Headache(784.0)    BP 117/75   Pulse 71   Resp 14   Ht 6\' 1"  (1.854 m)   Wt (!) 303 lb (137.4 kg)   SpO2 96%  BMI 39.98 kg/m   Opioid Risk Score:   Fall Risk Score:  `1  Depression screen PHQ 2/9  Depression screen [facility] 2/9 02/17/2018 02/15/2018 12/14/2017 03/26/2017 01/26/2017 10/28/2016 09/23/2016  Decreased Interest 0 1 1 1 1  0 1  Down, Depressed, Hopeless 0 1 1 1 1  0 1  PHQ - 2 Score 0 2 2 2 2  0 2  Altered sleeping - - - - - - -  Tired, decreased energy - - - - - - -  Change in appetite - - - - - - -  Feeling bad or failure about yourself  - - - - - - -  Trouble concentrating - - - - - - -  Moving slowly or fidgety/restless - - - - - - -  Suicidal thoughts - - - - - - -  PHQ-9 Score - - - - - - -  Difficult doing work/chores - - - - - - -    Review of Systems  Constitutional: Negative.   HENT: Negative.   Eyes: Negative.   Respiratory: Negative.   Cardiovascular: Negative.   Gastrointestinal: Negative.   Endocrine: Negative.   Genitourinary: Negative.     Musculoskeletal: Positive for back pain and gait problem.  Skin: Negative.   Allergic/Immunologic: Negative.   Neurological: Positive for weakness.  Psychiatric/Behavioral: Positive for dysphoric mood.  All other systems reviewed and are negative.      Objective:   Physical Exam  Constitutional: He is oriented to person, place, and time. He appears well-developed and well-nourished. No distress.  HENT:  Head: Normocephalic and atraumatic.  Eyes: Pupils are equal, round, and reactive to light. EOM are normal.  Musculoskeletal:  Reduced lumbar ROM with flex ext lateral bending and rotation   Neurological: He is alert and oriented to person, place, and time.  Skin: He is not diaphoretic.  Nursing note and vitals reviewed.  5/5 Bilateral HF, KE, ADF Gait uses cane no toe drag       Assessment & Plan:  1.  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 a.m. and 600 mg nightly Last urine drug screen 5/23//2019 which was consistent with medications Trolled substance agreement signed

## 2018-04-05 ENCOUNTER — Ambulatory Visit (HOSPITAL_BASED_OUTPATIENT_CLINIC_OR_DEPARTMENT_OTHER): Payer: PPO | Admitting: Pain Medicine

## 2018-04-05 ENCOUNTER — Ambulatory Visit
Admission: RE | Admit: 2018-04-05 | Discharge: 2018-04-05 | Disposition: A | Discharge status: Home / Self Care | Payer: PPO | Source: Ambulatory Visit | Attending: Pain Medicine | Admitting: Pain Medicine

## 2018-04-05 ENCOUNTER — Other Ambulatory Visit: Payer: Self-pay

## 2018-04-05 ENCOUNTER — Encounter: Payer: Self-pay | Admitting: Pain Medicine

## 2018-04-05 VITALS — BP 107/46 | HR 69 | Temp 96.9°F | Resp 16 | Ht 73.0 in | Wt 315.0 lb

## 2018-04-05 DIAGNOSIS — M47817 Spondylosis without myelopathy or radiculopathy, lumbosacral region: Secondary | ICD-10-CM

## 2018-04-05 DIAGNOSIS — M5136 Other intervertebral disc degeneration, lumbar region: Secondary | ICD-10-CM

## 2018-04-05 DIAGNOSIS — M47816 Spondylosis without myelopathy or radiculopathy, lumbar region: Secondary | ICD-10-CM

## 2018-04-05 DIAGNOSIS — G8929 Other chronic pain: Secondary | ICD-10-CM

## 2018-04-05 DIAGNOSIS — M5442 Lumbago with sciatica, left side: Secondary | ICD-10-CM

## 2018-04-05 MED ORDER — TRIAMCINOLONE ACETONIDE 40 MG/ML IJ SUSP
40.0000 mg | Freq: Once | INTRAMUSCULAR | Status: AC
  Administered 2018-04-05: 40 mg
  Filled 2018-04-05: qty 1

## 2018-04-05 MED ORDER — MIDAZOLAM HCL 5 MG/5ML IJ SOLN
1.0000 mg | INTRAMUSCULAR | Status: DC | PRN
  Administered 2018-04-05: 12:00:00 via INTRAVENOUS
  Filled 2018-04-05: qty 5

## 2018-04-05 MED ORDER — LACTATED RINGERS IV SOLN
1000.0000 mL | Freq: Once | INTRAVENOUS | Status: AC
  Administered 2018-04-05: 1000 mL via INTRAVENOUS

## 2018-04-05 MED ORDER — LIDOCAINE HCL 2 % IJ SOLN
20.0000 mL | Freq: Once | INTRAMUSCULAR | Status: AC
  Administered 2018-04-05: 400 mg
  Filled 2018-04-05: qty 20

## 2018-04-05 MED ORDER — ROPIVACAINE HCL 2 MG/ML IJ SOLN
9.0000 mL | Freq: Once | INTRAMUSCULAR | Status: AC
  Administered 2018-04-05: 10 mL via PERINEURAL
  Filled 2018-04-05: qty 10

## 2018-04-05 MED ORDER — FENTANYL CITRATE (PF) 100 MCG/2ML IJ SOLN
25.0000 ug | INTRAMUSCULAR | Status: DC | PRN
  Administered 2018-04-05: 100 ug via INTRAVENOUS
  Filled 2018-04-05: qty 2

## 2018-04-05 NOTE — Patient Instructions (Signed)

## 2018-04-05 NOTE — Progress Notes (Signed)
Patient's Name: Samuel [name]  MRN: [id number]  Referring Provider: No ref. provider found  DOB: 06-Jun-1975  PCP: System, Pcp Not In  DOS: 04/05/2018  Note by: Gaspar Cola, MD  Service setting: Ambulatory outpatient  Specialty: Interventional Pain Management  Patient type: Established  Location: [facility] (AMB) Pain Management Facility  Visit type: Interventional Procedure   Primary Reason for Visit: Interventional Pain Management Treatment. CC: Procedure  Procedure:          Anesthesia, Analgesia, Anxiolysis:  Type: Thermal Lumbar Facet, Medial Branch Radiofrequency Ablation/Neurotomy  #2  Primary Purpose: Therapeutic Region: Posterolateral Lumbosacral Spine Level: L2, L3, L4, L5, & S1 Medial Branch Level(s). These levels will denervate the L3-4, L4-5, and the L5-S1 lumbar facet joints. Laterality: Left  Type: Moderate (Conscious) Sedation combined with Local Anesthesia Indication(s): Analgesia and Anxiety Route: Intravenous (IV) IV Access: Secured Sedation: Meaningful verbal contact was maintained at all times during the procedure  Local Anesthetic: Lidocaine 1-2%  Position: Prone   Indications: 1. Spondylosis without myelopathy or radiculopathy, lumbosacral region   2. Lumbar facet syndrome (Primary Source of Pain) (Bilateral) (R>L)   3. Lumbar spondylosis   4. DDD (degenerative disc disease), lumbar   5. Chronic low back pain (Primary Source of Pain) (Bilateral) (R>L)    Samuel [name] has been dealing with the above chronic pain for longer than three months and has either failed to respond, was unable to tolerate, or simply did not get enough benefit from other more conservative therapies including, but not limited to: 1. Over-the-counter medications 2. Anti-inflammatory medications 3. Muscle relaxants 4. Membrane stabilizers 5. Opioids 6. Physical therapy and/or chiropractic manipulation 7. Modalities (Heat, ice, etc.) 8. Invasive techniques such as nerve blocks. Mr.  [name] has attained more than 50% relief of the pain from a series of diagnostic injections conducted in separate occasions.  Pain Score: Pre-procedure: 6 /10 Post-procedure: 1 /10  Pre-op Assessment:  [name] is a 42 y.o. (year old), male patient, seen today for interventional treatment. He  has a past surgical history that includes Back surgery. Samuel [name] has a current medication list which includes the following prescription(s): gabapentin, ibuprofen, morphine, OVER THE COUNTER MEDICATION, and oxycodone hcl, and the following Facility-Administered Medications: fentanyl and midazolam. His primarily concern today is the Procedure  Initial Vital Signs:  Pulse/HCG Rate: 69ECG Heart Rate: 74 Temp: 98.3 F (36.8 C) Resp: 18 BP: 123/74 SpO2: 99 %  BMI: Estimated body mass index is 41.56 kg/m as calculated from the following:   Height as of this encounter: 6\' 1"  (1.854 m).   Weight as of this encounter: 315 lb (142.9 kg).  Risk Assessment: Allergies: Reviewed. He has No Known Allergies.  Allergy Precautions: None required Coagulopathies: Reviewed. None identified.  Blood-thinner therapy: None at this time Active Infection(s): Reviewed. None identified. Samuel [name] is afebrile  Site Confirmation: Samuel [name] was asked to confirm the procedure and laterality before marking the site Procedure checklist: Completed Consent: Before the procedure and under the influence of no sedative(s), amnesic(s), or anxiolytics, the patient was informed of the treatment options, risks and possible complications. To fulfill our ethical and legal obligations, as recommended by the American Medical Association's Code of Ethics, I have informed the patient of my clinical impression; the nature and purpose of the treatment or procedure; the risks, benefits, and possible complications of the intervention; the alternatives, including doing nothing; the risk(s) and benefit(s) of the alternative treatment(s) or procedure(s);  and the risk(s) and benefit(s) of doing  nothing. The patient was provided information about the general risks and possible complications associated with the procedure. These may include, but are not limited to: failure to achieve desired goals, infection, bleeding, organ or nerve damage, allergic reactions, paralysis, and death. In addition, the patient was informed of those risks and complications associated to Spine-related procedures, such as failure to decrease pain; infection (i.e.: Meningitis, epidural or intraspinal abscess); bleeding (i.e.: epidural hematoma, subarachnoid hemorrhage, or any other type of intraspinal or peri-dural bleeding); organ or nerve damage (i.e.: Any type of peripheral nerve, nerve root, or spinal cord injury) with subsequent damage to sensory, motor, and/or autonomic systems, resulting in permanent pain, numbness, and/or weakness of one or several areas of the body; allergic reactions; (i.e.: anaphylactic reaction); and/or death. Furthermore, the patient was informed of those risks and complications associated with the medications. These include, but are not limited to: allergic reactions (i.e.: anaphylactic or anaphylactoid reaction(s)); adrenal axis suppression; blood sugar elevation that in diabetics may result in ketoacidosis or comma; water retention that in patients with history of congestive heart failure may result in shortness of breath, pulmonary edema, and decompensation with resultant heart failure; weight gain; swelling or edema; medication-induced neural toxicity; particulate matter embolism and blood vessel occlusion with resultant organ, and/or nervous system infarction; and/or aseptic necrosis of one or more joints. Finally, the patient was informed that Medicine is not an exact science; therefore, there is also the possibility of unforeseen or unpredictable risks and/or possible complications that may result in a catastrophic outcome. The patient indicated having  understood very clearly. We have given the patient no guarantees and we have made no promises. Enough time was given to the patient to ask questions, all of which were answered to the patient's satisfaction. Mr. Gowan has indicated that he wanted to continue with the procedure. Attestation: I, the ordering provider, attest that I have discussed with the patient the benefits, risks, side-effects, alternatives, likelihood of achieving goals, and potential problems during recovery for the procedure that I have provided informed consent. Date  Time: 04/05/2018 10:28 AM  Pre-Procedure Preparation:  Monitoring: As per clinic protocol. Respiration, ETCO2, SpO2, BP, heart rate and rhythm monitor placed and checked for adequate function Safety Precautions: Patient was assessed for positional comfort and pressure points before starting the procedure. Time-out: I initiated and conducted the "Time-out" before starting the procedure, as per protocol. The patient was asked to participate by confirming the accuracy of the "Time Out" information. Verification of the correct person, site, and procedure were performed and confirmed by me, the nursing staff, and the patient. "Time-out" conducted as per Joint Commission's Universal Protocol (UP.01.01.01). Time: 1130  Description of Procedure:          Laterality: Left Levels:  L2, L3, L4, L5, & S1 Medial Branch Level(s), at the L3-4, L4-5, and the L5-S1 lumbar facet joints. Area Prepped: Lumbosacral Prepping solution: ChloraPrep (2% chlorhexidine gluconate and 70% isopropyl alcohol) Safety Precautions: Aspiration looking for blood return was conducted prior to all injections. At no point did we inject any substances, as a needle was being advanced. Before injecting, the patient was told to immediately notify me if he was experiencing any new onset of "ringing in the ears, or metallic taste in the mouth". No attempts were made at seeking any paresthesias. Safe injection  practices and needle disposal techniques used. Medications properly checked for expiration dates. SDV (single dose vial) medications used. After the completion of the procedure, all disposable equipment used was discarded  in the proper designated medical waste containers. Local Anesthesia: Protocol guidelines were followed. The patient was positioned over the fluoroscopy table. The area was prepped in the usual manner. The time-out was completed. The target area was identified using fluoroscopy. A 12-in long, straight, sterile hemostat was used with fluoroscopic guidance to locate the targets for each level blocked. Once located, the skin was marked with an approved surgical skin marker. Once all sites were marked, the skin (epidermis, dermis, and hypodermis), as well as deeper tissues (fat, connective tissue and muscle) were infiltrated with a small amount of a short-acting local anesthetic, loaded on a 10cc syringe with a 25G, 1.5-in  Needle. An appropriate amount of time was allowed for local anesthetics to take effect before proceeding to the next step. Local Anesthetic: Lidocaine 2.0% The unused portion of the local anesthetic was discarded in the proper designated containers. Technical explanation of process:  Radiofrequency Ablation (RFA) L2 Medial Branch Nerve RFA: The target area for the L2 medial branch is at the junction of the postero-lateral aspect of the superior articular process and the superior, posterior, and medial edge of the transverse process of L3. Under fluoroscopic guidance, a Radiofrequency needle was inserted until contact was made with os over the superior postero-lateral aspect of the pedicular shadow (target area). Sensory and motor testing was conducted to properly adjust the position of the needle. Once satisfactory placement of the needle was achieved, the numbing solution was slowly injected after negative aspiration for blood. 2.0 mL of the nerve block solution was injected  without difficulty or complication. After waiting for at least 3 minutes, the ablation was performed. Once completed, the needle was removed intact. L3 Medial Branch Nerve RFA: The target area for the L3 medial branch is at the junction of the postero-lateral aspect of the superior articular process and the superior, posterior, and medial edge of the transverse process of L4. Under fluoroscopic guidance, a Radiofrequency needle was inserted until contact was made with os over the superior postero-lateral aspect of the pedicular shadow (target area). Sensory and motor testing was conducted to properly adjust the position of the needle. Once satisfactory placement of the needle was achieved, the numbing solution was slowly injected after negative aspiration for blood. 2.0 mL of the nerve block solution was injected without difficulty or complication. After waiting for at least 3 minutes, the ablation was performed. Once completed, the needle was removed intact. L4 Medial Branch Nerve RFA: The target area for the L4 medial branch is at the junction of the postero-lateral aspect of the superior articular process and the superior, posterior, and medial edge of the transverse process of L5. Under fluoroscopic guidance, a Radiofrequency needle was inserted until contact was made with os over the superior postero-lateral aspect of the pedicular shadow (target area). Sensory and motor testing was conducted to properly adjust the position of the needle. Once satisfactory placement of the needle was achieved, the numbing solution was slowly injected after negative aspiration for blood. 2.0 mL of the nerve block solution was injected without difficulty or complication. After waiting for at least 3 minutes, the ablation was performed. Once completed, the needle was removed intact. L5 Medial Branch Nerve RFA: The target area for the L5 medial branch is at the junction of the postero-lateral aspect of the superior articular  process of S1 and the superior, posterior, and medial edge of the sacral ala. Under fluoroscopic guidance, a Radiofrequency needle was inserted until contact was made with os over the  superior postero-lateral aspect of the pedicular shadow (target area). Sensory and motor testing was conducted to properly adjust the position of the needle. Once satisfactory placement of the needle was achieved, the numbing solution was slowly injected after negative aspiration for blood. 2.0 mL of the nerve block solution was injected without difficulty or complication. After waiting for at least 3 minutes, the ablation was performed. Once completed, the needle was removed intact. S1 Medial Branch Nerve RFA: The target area for the S1 medial branch is located inferior to the junction of the S1 superior articular process and the L5 inferior articular process, posterior, inferior, and lateral to the 6 o'clock position of the L5-S1 facet joint, just superior to the S1 posterior foramen. Under fluoroscopic guidance, the Radiofrequency needle was advanced until contact was made with os over the Target area. Sensory and motor testing was conducted to properly adjust the position of the needle. Once satisfactory placement of the needle was achieved, the numbing solution was slowly injected after negative aspiration for blood. 2.0 mL of the nerve block solution was injected without difficulty or complication. After waiting for at least 3 minutes, the ablation was performed. Once completed, the needle was removed intact. Radiofrequency lesioning (ablation):  Radiofrequency Generator: NeuroTherm [id number] Sensory Stimulation Parameters: 50 Hz was used to locate & identify the nerve, making sure that the needle was positioned such that there was no sensory stimulation below 0.3 V or above 0.7 V. Motor Stimulation Parameters: 2 Hz was used to evaluate the motor component. Care was taken not to lesion any nerves that demonstrated motor  stimulation of the lower extremities at an output of less than 2.5 times that of the sensory threshold, or a maximum of 2.0 V. Lesioning Technique Parameters: Standard Radiofrequency settings. (Not bipolar or pulsed.) Temperature Settings: 80 degrees C Lesioning time: 60 seconds Intra-operative Compliance: Compliant Materials & Medications: Needle(s) (Electrode/Cannula) Type: Teflon-coated, curved tip, Radiofrequency needle(s) Gauge: 22G Length: 10cm Numbing solution: 0.2% PF-Ropivacaine + Triamcinolone (40 mg/mL) diluted to a final concentration of 4 mg of Triamcinolone/mL of Ropivacaine The unused portion of the solution was discarded in the proper designated containers.  Once the entire procedure was completed, the treated area was cleaned, making sure to leave some of the prepping solution back to take advantage of its long term bactericidal properties.  Illustration of the posterior view of the lumbar spine and the posterior neural structures. Laminae of L2 through S1 are labeled. DPRL5, dorsal primary ramus of L5; DPRS1, dorsal primary ramus of S1; DPR3, dorsal primary ramus of L3; FJ, facet (zygapophyseal) joint L3-L4; I, inferior articular process of L4; LB1, lateral branch of dorsal primary ramus of L1; IAB, inferior articular branches from L3 medial branch (supplies L4-L5 facet joint); IBP, intermediate branch plexus; MB3, medial branch of dorsal primary ramus of L3; NR3, third lumbar nerve root; S, superior articular process of L5; SAB, superior articular branches from L4 (supplies L4-5 facet joint also); TP3, transverse process of L3.  Vitals:   04/05/18 1220 04/05/18 1228 04/05/18 1238 04/05/18 1247  BP: 101/74 104/80 (!) 116/59 (!) 107/46  Pulse:      Resp: 20 (!) 9 15 16   Temp:  (!) 97.4 F (36.3 C)  (!) 96.9 F (36.1 C)  TempSrc:  Temporal  Temporal  SpO2: 100% 97% 98% 100%  Weight:      Height:        Start Time: 1130 hrs. End Time: 1218 hrs.  Imaging Guidance  (Spinal):  Type of Imaging Technique: Fluoroscopy Guidance (Spinal) Indication(s): Assistance in needle guidance and placement for procedures requiring needle placement in or near specific anatomical locations not easily accessible without such assistance. Exposure Time: Please see nurses notes. Contrast: None used. Fluoroscopic Guidance: I was personally present during the use of fluoroscopy. "Tunnel Vision Technique" used to obtain the best possible view of the target area. Parallax error corrected before commencing the procedure. "Direction-depth-direction" technique used to introduce the needle under continuous pulsed fluoroscopy. Once target was reached, antero-posterior, oblique, and lateral fluoroscopic projection used confirm needle placement in all planes. Images permanently stored in EMR. Interpretation: No contrast injected. I personally interpreted the imaging intraoperatively. Adequate needle placement confirmed in multiple planes. Permanent images saved into the patient's record.  Antibiotic Prophylaxis:   Anti-infectives (From admission, onward)   None     Indication(s): None identified  Post-operative Assessment:  Post-procedure Vital Signs:  Pulse/HCG Rate: 6962 Temp: (!) 96.9 F (36.1 C) Resp: 16 BP: (!) 107/46 SpO2: 100 %  EBL: None  Complications: No immediate post-treatment complications observed by team, or reported by patient.  Note: The patient tolerated the entire procedure well. A repeat set of vitals were taken after the procedure and the patient was kept under observation following institutional policy, for this type of procedure. Post-procedural neurological assessment was performed, showing return to baseline, prior to discharge. The patient was provided with post-procedure discharge instructions, including a section on how to identify potential problems. Should any problems arise concerning this procedure, the patient was given instructions to  immediately contact us, at any time, without hesitation. In any case, we plan to contact the patient by telephone for a follow-up status report regarding this interventional procedure.  Comments:  No additional relevant information.  Plan of Care    Imaging Orders     DG C-Arm 1-60 Min-No Report  Procedure Orders     Radiofrequency,Lumbar  Medications ordered for procedure: Meds ordered this encounter  Medications  . lidocaine (XYLOCAINE) 2 % (with pres) injection 400 mg  . midazolam (VERSED) 5 MG/5ML injection 1-2 mg    Make sure Flumazenil is available in the pyxis when using this medication. If oversedation occurs, administer 0.2 mg IV over 15 sec. If after 45 sec no response, administer 0.2 mg again over 1 min; may repeat at 1 min intervals; not to exceed 4 doses (1 mg)  . fentaNYL (SUBLIMAZE) injection 25-50 mcg    Make sure Narcan is available in the pyxis when using this medication. In the event of respiratory depression (RR< 8/min): Titrate NARCAN (naloxone) in increments of 0.1 to 0.2 mg IV at 2-3 minute intervals, until desired degree of reversal.  . lactated ringers infusion 1,000 mL  . ropivacaine (PF) 2 mg/mL (0.2%) (NAROPIN) injection 9 mL  . triamcinolone acetonide (KENALOG-40) injection 40 mg   Medications administered: We administered lidocaine, midazolam, fentaNYL, lactated ringers, ropivacaine (PF) 2 mg/mL (0.2%), and triamcinolone acetonide.  See the medical record for exact dosing, route, and time of administration.  Disposition: Discharge home  Discharge Date & Time: 04/05/2018; 1252 hrs.   Physician-requested Follow-up: Return for Post-RFA eval (6 wks), w/ Dionisio David, NP.  Future Appointments  Date Time Provider [place]  04/12/2018 10:20 AM Bayard Hugger, NP [facility]  05/26/2018 11:30 AM Vevelyn Francois, NP [facility] None   Primary Care Physician: System, Pcp Not In Location: [facility] Outpatient Pain Management Facility Note by: Gaspar Cola, MD Date: 04/05/2018; Time: 12:58 PM  Disclaimer:  Medicine is not an Chief Strategy Officer. The only guarantee in medicine is that nothing is guaranteed. It is important to note that the decision to proceed with this intervention was based on the information collected from the patient. The Data and conclusions were drawn from the patient's questionnaire, the interview, and the physical examination. Because the information was provided in large part by the patient, it cannot be guaranteed that it has not been purposely or unconsciously manipulated. Every effort has been made to obtain as much relevant data as possible for this evaluation. It is important to note that the conclusions that lead to this procedure are derived in large part from the available data. Always take into account that the treatment will also be dependent on availability of resources and existing treatment guidelines, considered by other Pain Management Practitioners as being common knowledge and practice, at the time of the intervention. For Medico-Legal purposes, it is also important to point out that variation in procedural techniques and pharmacological choices are the acceptable norm. The indications, contraindications, technique, and results of the above procedure should only be interpreted and judged by a Board-Certified Interventional Pain Specialist with extensive familiarity and expertise in the same exact procedure and technique.

## 2018-04-05 NOTE — Progress Notes (Signed)
Safety precautions to be maintained throughout the outpatient stay will include: orient to surroundings, keep bed in low position, maintain call bell within reach at all times, provide assistance with transfer out of bed and ambulation.  

## 2018-04-07 ENCOUNTER — Telehealth: Payer: Self-pay | Admitting: *Deleted

## 2018-04-07 NOTE — Telephone Encounter (Signed)
Voicemail left with patient re; procedure on yesterday to call if there are any questions or concerns.

## 2018-04-12 ENCOUNTER — Encounter: Payer: PPO | Attending: Physical Medicine & Rehabilitation | Admitting: Registered Nurse

## 2018-04-12 ENCOUNTER — Other Ambulatory Visit: Payer: Self-pay

## 2018-04-12 ENCOUNTER — Encounter: Payer: Self-pay | Admitting: Registered Nurse

## 2018-04-12 ENCOUNTER — Ambulatory Visit: Payer: PPO | Admitting: Physical Medicine & Rehabilitation

## 2018-04-12 VITALS — BP 116/73 | HR 66 | Ht 73.0 in | Wt 292.6 lb

## 2018-04-12 DIAGNOSIS — G8928 Other chronic postprocedural pain: Secondary | ICD-10-CM | POA: Diagnosis not present

## 2018-04-12 DIAGNOSIS — Z981 Arthrodesis status: Secondary | ICD-10-CM | POA: Diagnosis not present

## 2018-04-12 DIAGNOSIS — G894 Chronic pain syndrome: Secondary | ICD-10-CM

## 2018-04-12 DIAGNOSIS — M961 Postlaminectomy syndrome, not elsewhere classified: Secondary | ICD-10-CM

## 2018-04-12 DIAGNOSIS — M79605 Pain in left leg: Secondary | ICD-10-CM | POA: Insufficient documentation

## 2018-04-12 DIAGNOSIS — M545 Low back pain: Secondary | ICD-10-CM | POA: Diagnosis not present

## 2018-04-12 DIAGNOSIS — Z5181 Encounter for therapeutic drug level monitoring: Secondary | ICD-10-CM

## 2018-04-12 DIAGNOSIS — M79604 Pain in right leg: Secondary | ICD-10-CM | POA: Diagnosis not present

## 2018-04-12 DIAGNOSIS — M5416 Radiculopathy, lumbar region: Secondary | ICD-10-CM | POA: Diagnosis not present

## 2018-04-12 DIAGNOSIS — M5136 Other intervertebral disc degeneration, lumbar region: Secondary | ICD-10-CM

## 2018-04-12 DIAGNOSIS — M62830 Muscle spasm of back: Secondary | ICD-10-CM

## 2018-04-12 DIAGNOSIS — Z79891 Long term (current) use of opiate analgesic: Secondary | ICD-10-CM | POA: Diagnosis not present

## 2018-04-12 MED ORDER — MORPHINE SULFATE ER 30 MG PO TBCR: 30.0000 mg | EXTENDED_RELEASE_TABLET | Freq: Two times a day (BID) | ORAL | 0 refills | Status: DC

## 2018-04-12 MED ORDER — OXYCODONE HCL 10 MG PO TABS: 10.0000 mg | ORAL_TABLET | Freq: Four times a day (QID) | ORAL | 0 refills | Status: DC | PRN

## 2018-04-12 NOTE — Progress Notes (Signed)
Subjective:    Patient ID: Samuel [name], male    DOB: 1975/08/03, 42 y.o.   MRN: [id number]  HPI: [name] is a 42 year old male who returns for follow-up appointment for chronic pain and medication refill.  He states his pain is located in his lower back radiating down his right lower extremity.  He rates his pain 5.  His current exercise routine is walking.  Samuel [name] had left lumbar radiofrequency by Dr. Dossie Arbour on 04/05/2018 with good results noted he reports  Samuel [name] Morphine Equivalent is 120.00 MME, last UDS was performed on 10/14/2017 it was consistent.  UDS ordered for today.  Pain Inventory Average Pain 5 Pain Right Now 5 My pain is constant, sharp, dull and aching  In the last 24 hours, has pain interfered with the following? General activity 9 Relation with others 9 Enjoyment of life 9 What TIME of day is your pain at its worst? morning evening and night Sleep (in general) Fair  Pain is worse with: walking, bending, sitting and standing Pain improves with: rest and medication Relief from Meds: 5  Mobility walk with assistance use a cane how many minutes can you walk? 5 ability to climb steps?  yes do you drive?  yes  Function disabled: date disabled 2010 I need assistance with the following:  dressing, bathing, meal prep, household duties and shopping  Neuro/Psych weakness trouble walking depression  Prior Studies Any changes since last visit?  no  Physicians involved in your care Any changes since last visit?  no   Family History  Problem Relation Age of Onset  . Hypertension Father    Social History   Socioeconomic History  . Marital status: Married    Spouse name: Not on file  . Number of children: Not on file  . Years of education: Not on file  . Highest education level: Not on file  Occupational History  . Not on file  Social Needs  . Financial resource strain: Not on file  . Food insecurity:    Worry: Not on file      Inability: Not on file  . Transportation needs:    Medical: Not on file    Non-medical: Not on file  Tobacco Use  . Smoking status: Never Smoker  . Smokeless tobacco: Never Used  Substance and Sexual Activity  . Alcohol use: No    Alcohol/week: 0.0 standard drinks  . Drug use: No  . Sexual activity: Not on file  Lifestyle  . Physical activity:    Days per week: Not on file    Minutes per session: Not on file  . Stress: Not on file  Relationships  . Social connections:    Talks on phone: Not on file    Gets together: Not on file    Attends religious service: Not on file    Active member of club or organization: Not on file    Attends meetings of clubs or organizations: Not on file    Relationship status: Not on file  Other Topics Concern  . Not on file  Social History Narrative  . Not on file   Past Surgical History:  Procedure Laterality Date  . BACK SURGERY     lower back surgery x 2   Past Medical History:  Diagnosis Date  . Arthritis    "some in my back"  . GERD (gastroesophageal reflux disease)   . Headache(784.0)    BP 116/73   Pulse 66  Ht 6\' 1"  (1.854 m)   Wt 292 lb 9.6 oz (132.7 kg)   SpO2 98%   BMI 38.60 kg/m   Opioid Risk Score:   Fall Risk Score:  `1  Depression screen PHQ 2/9  Depression screen [facility] 2/9 04/12/2018 04/05/2018 02/17/2018 02/15/2018 12/14/2017 03/26/2017 01/26/2017  Decreased Interest 0 0 0 1 1 1 1   Down, Depressed, Hopeless 0 0 0 1 1 1 1   PHQ - 2 Score 0 0 0 2 2 2 2   Altered sleeping - - - - - - -  Tired, decreased energy - - - - - - -  Change in appetite - - - - - - -  Feeling bad or failure about yourself  - - - - - - -  Trouble concentrating - - - - - - -  Moving slowly or fidgety/restless - - - - - - -  Suicidal thoughts - - - - - - -  PHQ-9 Score - - - - - - -  Difficult doing work/chores - - - - - - -    Review of Systems  Constitutional: Negative.   HENT: Negative.   Eyes: Negative.   Respiratory: Negative.    Cardiovascular: Negative.   Gastrointestinal: Negative.   Endocrine: Negative.   Genitourinary: Negative.   Musculoskeletal: Negative.   Skin: Negative.   Allergic/Immunologic: Negative.   Neurological: Negative.   Hematological: Negative.   Psychiatric/Behavioral: Negative.   All other systems reviewed and are negative.      Objective:   Physical Exam  Constitutional: He is oriented to person, place, and time. He appears well-developed and well-nourished.  HENT:  Head: Normocephalic and atraumatic.  Neck: Normal range of motion. Neck supple.  Cardiovascular: Normal rate and regular rhythm.  Pulmonary/Chest: Effort normal and breath sounds normal.  Musculoskeletal:  Normal Muscle Bulk and Muscle Testing Reveals: Upper Extremities: Full ROM and Muscle Strength 5/5 Lumbar Hypersensitivity Lower Extremities: Decreased ROM and Muscle Strength 5/5 Arises from Table slowly using cane for support Antalgic Gait   Neurological: He is alert and oriented to person, place, and time.  Skin: Skin is warm and dry.  Psychiatric: He has a normal mood and affect. His behavior is normal.  Nursing note and vitals reviewed.         Assessment & Plan:  1. 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 He has lumbar degenerative disc disease. Continuecurrent medication regimen withGabapentin, Continue current Medication regimen. S/P  Left  Lumbar Facet Radiofrequency Ablation with sedation, on 04/05/2018 by Dr. Dossie Arbour with good results noted. 04/12/2018. Refilled: MS Contin30 mg one tablet every 12 hours #60 and Oxycodone 10mg  one tablet every 6 hours as needed for pain #120. We will continue the opioid monitoring program, this consists of regular clinic visits, examinations, urine drug screen, pill counts as well as use of [place] Controlled Substance reporting System. 2. Sacroiliac Dysfunction: Continue with current medication, heat and exercise  regime. 04/12/2018 3. Muscle Spasm:No complaints Today.Continueto Monitor.04/12/2018  20 mminutes of face to face patient care time was spent during this visit. All questions were encouraged and answered.   F/U in 1 month.

## 2018-04-17 LAB — 6-ACETYLMORPHINE,TOXASSURE ADD
6-ACETYLMORPHINE: NEGATIVE
6-acetylmorphine: NOT DETECTED ng/mg creat

## 2018-04-17 LAB — TOXASSURE SELECT,+ANTIDEPR,UR

## 2018-04-19 ENCOUNTER — Telehealth: Payer: Self-pay | Admitting: *Deleted

## 2018-04-19 NOTE — Telephone Encounter (Signed)
Urine drug screen for this encounter is consistent for prescribed medication 

## 2018-05-10 ENCOUNTER — Encounter: Payer: Self-pay | Admitting: Registered Nurse

## 2018-05-10 ENCOUNTER — Encounter: Payer: PPO | Attending: Physical Medicine & Rehabilitation | Admitting: Registered Nurse

## 2018-05-10 VITALS — BP 122/79 | HR 68 | Resp 16 | Ht 73.0 in | Wt 292.0 lb

## 2018-05-10 DIAGNOSIS — G894 Chronic pain syndrome: Secondary | ICD-10-CM

## 2018-05-10 DIAGNOSIS — Z79891 Long term (current) use of opiate analgesic: Secondary | ICD-10-CM | POA: Diagnosis not present

## 2018-05-10 DIAGNOSIS — G8929 Other chronic pain: Secondary | ICD-10-CM | POA: Diagnosis not present

## 2018-05-10 DIAGNOSIS — M961 Postlaminectomy syndrome, not elsewhere classified: Secondary | ICD-10-CM

## 2018-05-10 DIAGNOSIS — Z5181 Encounter for therapeutic drug level monitoring: Secondary | ICD-10-CM | POA: Diagnosis not present

## 2018-05-10 DIAGNOSIS — M79605 Pain in left leg: Secondary | ICD-10-CM | POA: Insufficient documentation

## 2018-05-10 DIAGNOSIS — M545 Low back pain, unspecified: Secondary | ICD-10-CM

## 2018-05-10 DIAGNOSIS — Z981 Arthrodesis status: Secondary | ICD-10-CM | POA: Insufficient documentation

## 2018-05-10 DIAGNOSIS — M62830 Muscle spasm of back: Secondary | ICD-10-CM | POA: Diagnosis not present

## 2018-05-10 DIAGNOSIS — M51369 Other intervertebral disc degeneration, lumbar region without mention of lumbar back pain or lower extremity pain: Secondary | ICD-10-CM

## 2018-05-10 DIAGNOSIS — G8928 Other chronic postprocedural pain: Secondary | ICD-10-CM | POA: Insufficient documentation

## 2018-05-10 DIAGNOSIS — M79604 Pain in right leg: Secondary | ICD-10-CM | POA: Insufficient documentation

## 2018-05-10 DIAGNOSIS — M5136 Other intervertebral disc degeneration, lumbar region: Secondary | ICD-10-CM | POA: Diagnosis not present

## 2018-05-10 MED ORDER — MORPHINE SULFATE ER 30 MG PO TBCR: 30.0000 mg | EXTENDED_RELEASE_TABLET | Freq: Two times a day (BID) | ORAL | 0 refills | Status: DC

## 2018-05-10 MED ORDER — PREDNISONE 20 MG PO TABS: ORAL_TABLET | ORAL | 0 refills | Status: DC

## 2018-05-10 MED ORDER — GABAPENTIN 300 MG PO CAPS: ORAL_CAPSULE | ORAL | 2 refills | Status: DC

## 2018-05-10 MED ORDER — OXYCODONE HCL 10 MG PO TABS: 10.0000 mg | ORAL_TABLET | Freq: Four times a day (QID) | ORAL | 0 refills | Status: DC | PRN

## 2018-05-10 MED ORDER — TIZANIDINE HCL 2 MG PO TABS: 2.0000 mg | ORAL_TABLET | Freq: Three times a day (TID) | ORAL | 2 refills | Status: DC | PRN

## 2018-05-10 NOTE — Progress Notes (Signed)
Subjective:    Patient ID: Samuel [name], male    DOB: [birth year], 42 y.o.   MRN: [id number]  HPI: Samuel [name] is a 42 y.o. male who returns for follow up appointment for chronic pain and medication refill. He states his pain is located in his lower back radiating into his right groin. Also reports his pain has increased in intensity over the last month, he denies falling. Only thing he can remember he was getting into the car and he's not sure if he twisted wrong, he didn't call office, educated on calling office when there is a change in his pain, he verbalizes understanding. States he was trying to deal with it. Assessment made with facial grimacing noted, we will prescribe a steroid tapering, he verbalizes understanding. Instructed to call office at the end of the week to evaluate medication management, he verbalizes understanding. He rates his pain 8. His  current exercise regime is walking.   Samuel [name] Morphine equivalent is 120.00 MME.  Last UDS was Performed on 04/12/2018, it was consistent.   Pain Inventory Average Pain 8 Pain Right Now 8 My pain is sharp, dull and stabbing  In the last 24 hours, has pain interfered with the following? General activity 10 Relation with others 9 Enjoyment of life 10 What TIME of day is your pain at its worst? all Sleep (in general) Poor  Pain is worse with: walking, bending, sitting and standing Pain improves with: rest and medication Relief from Meds: 3  Mobility walk with assistance use a cane ability to climb steps?  yes do you drive?  yes  Function disabled: date disabled 2010 I need assistance with the following:  dressing, bathing, meal prep, household duties and shopping  Neuro/Psych weakness trouble walking spasms depression  Prior Studies Any changes since last visit?  no  Physicians involved in your care Any changes since last visit?  no   Family History  Problem Relation Age of Onset  . Hypertension  Father    Social History   Socioeconomic History  . Marital status: Married    Spouse name: Not on file  . Number of children: Not on file  . Years of education: Not on file  . Highest education level: Not on file  Occupational History  . Not on file  Social Needs  . Financial resource strain: Not on file  . Food insecurity:    Worry: Not on file    Inability: Not on file  . Transportation needs:    Medical: Not on file    Non-medical: Not on file  Tobacco Use  . Smoking status: Never Smoker  . Smokeless tobacco: Never Used  Substance and Sexual Activity  . Alcohol use: No    Alcohol/week: 0.0 standard drinks  . Drug use: No  . Sexual activity: Not on file  Lifestyle  . Physical activity:    Days per week: Not on file    Minutes per session: Not on file  . Stress: Not on file  Relationships  . Social connections:    Talks on phone: Not on file    Gets together: Not on file    Attends religious service: Not on file    Active member of club or organization: Not on file    Attends meetings of clubs or organizations: Not on file    Relationship status: Not on file  Other Topics Concern  . Not on file  Social History Narrative  . Not on  file   Past Surgical History:  Procedure Laterality Date  . BACK SURGERY     lower back surgery x 2   Past Medical History:  Diagnosis Date  . Arthritis    "some in my back"  . GERD (gastroesophageal reflux disease)   . Headache(784.0)    Ht 6\' 1"  (1.854 m)   Wt 292 lb (132.5 kg)   BMI 38.52 kg/m   Opioid Risk Score:   Fall Risk Score:  `1  Depression screen PHQ 2/9  Depression screen [facility] 2/9 04/12/2018 04/05/2018 02/17/2018 02/15/2018 12/14/2017 03/26/2017 01/26/2017  Decreased Interest 0 0 0 1 1 1 1   Down, Depressed, Hopeless 0 0 0 1 1 1 1   PHQ - 2 Score 0 0 0 2 2 2 2   Altered sleeping - - - - - - -  Tired, decreased energy - - - - - - -  Change in appetite - - - - - - -  Feeling bad or failure about yourself  - - - -  - - -  Trouble concentrating - - - - - - -  Moving slowly or fidgety/restless - - - - - - -  Suicidal thoughts - - - - - - -  PHQ-9 Score - - - - - - -  Difficult doing work/chores - - - - - - -    Review of Systems  Constitutional: Negative.   HENT: Negative.   Eyes: Negative.   Respiratory: Negative.   Cardiovascular: Negative.   Gastrointestinal: Negative.   Endocrine: Negative.   Genitourinary: Negative.   Musculoskeletal: Positive for back pain and gait problem.       Spasms   Skin: Negative.   Allergic/Immunologic: Negative.   Neurological: Positive for weakness.  Hematological: Negative.   Psychiatric/Behavioral: Positive for dysphoric mood.  All other systems reviewed and are negative.      Objective:   Physical Exam Vitals signs and nursing note reviewed.  Constitutional:      Appearance: Normal appearance.  Cardiovascular:     Pulses: Normal pulses.     Heart sounds: Normal heart sounds.  Pulmonary:     Effort: Pulmonary effort is normal.     Breath sounds: Normal breath sounds.  Musculoskeletal:     Comments: Normal Muscle Bulk and Muscle Testing Reveals:  Upper Extremities:Full  ROM and Muscle Strength 5/5 Lumbar Hypersensitivity  Lower Extremities: Decreased ROM and Muscle Strength 4/5 Bilateral Lower Extremities Flexion Produces Pain into Lumbar Arises from Table Slowly Antalgic  Gait   Skin:    General: Skin is warm and dry.  Neurological:     Mental Status: He is alert and oriented to person, place, and time.  Psychiatric:        Mood and Affect: Mood normal.        Behavior: Behavior normal.           Assessment & Plan:  1. Acute exacerbation of chronic low back pain: RX: Steroid Taper Pack.  2. 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 He has lumbar degenerative disc disease. Continuecurrent medication regimen withGabapentin, Continue current Medication regimen. S/P  Left  Lumbar Facet Radiofrequency  Ablation with sedation, on 04/05/2018 by Dr. Dossie Arbour with good results noted. 05/10/2018. Refilled: MS Contin30 mg one tablet every 12 hours #60 and Oxycodone 10mg  one tablet every 6 hours as needed for pain #120. We will continue the opioid monitoring program, this consists of regular clinic visits, examinations, urine drug screen,  pill counts as well as use of [place] Controlled Substance reporting System. 3. Sacroiliac Dysfunction: Continue with current medication, heat and exercise regime. 05/10/2018 4. Muscle Spasm:Continue Tizanidine.Continueto Monitor.05/10/2018  20 mminutes of face to face patient care time was spent during this visit. All questions were encouraged and answered.

## 2018-05-13 NOTE — Telephone Encounter (Signed)
Message from patient

## 2018-05-26 ENCOUNTER — Ambulatory Visit: Payer: PPO | Admitting: Nurse Practitioner

## 2018-06-08 ENCOUNTER — Ambulatory Visit: Payer: PPO | Admitting: Nurse Practitioner

## 2018-06-14 ENCOUNTER — Encounter: Payer: PPO | Attending: Physical Medicine & Rehabilitation | Admitting: Registered Nurse

## 2018-06-14 ENCOUNTER — Other Ambulatory Visit: Payer: Self-pay

## 2018-06-14 ENCOUNTER — Encounter: Payer: Self-pay | Admitting: Registered Nurse

## 2018-06-14 VITALS — BP 107/72 | HR 67 | Ht 73.0 in | Wt 288.0 lb

## 2018-06-14 DIAGNOSIS — M961 Postlaminectomy syndrome, not elsewhere classified: Secondary | ICD-10-CM | POA: Insufficient documentation

## 2018-06-14 DIAGNOSIS — M5416 Radiculopathy, lumbar region: Secondary | ICD-10-CM | POA: Diagnosis not present

## 2018-06-14 DIAGNOSIS — G8928 Other chronic postprocedural pain: Secondary | ICD-10-CM | POA: Diagnosis not present

## 2018-06-14 DIAGNOSIS — M545 Low back pain: Secondary | ICD-10-CM | POA: Diagnosis not present

## 2018-06-14 DIAGNOSIS — M5136 Other intervertebral disc degeneration, lumbar region: Secondary | ICD-10-CM | POA: Diagnosis not present

## 2018-06-14 DIAGNOSIS — M79604 Pain in right leg: Secondary | ICD-10-CM | POA: Insufficient documentation

## 2018-06-14 DIAGNOSIS — M62830 Muscle spasm of back: Secondary | ICD-10-CM | POA: Diagnosis not present

## 2018-06-14 DIAGNOSIS — G894 Chronic pain syndrome: Secondary | ICD-10-CM

## 2018-06-14 DIAGNOSIS — Z79899 Other long term (current) drug therapy: Secondary | ICD-10-CM

## 2018-06-14 DIAGNOSIS — M79605 Pain in left leg: Secondary | ICD-10-CM | POA: Diagnosis not present

## 2018-06-14 DIAGNOSIS — Z981 Arthrodesis status: Secondary | ICD-10-CM | POA: Diagnosis not present

## 2018-06-14 DIAGNOSIS — Z5181 Encounter for therapeutic drug level monitoring: Secondary | ICD-10-CM

## 2018-06-14 NOTE — Progress Notes (Signed)
Subjective:    Patient ID: Samuel [name], male    DOB: 01-28-1976, 43 y.o.   MRN: [id number]  HPI: Samuel [name] is a 43 y.o. male who returns for follow up appointment for chronic pain and medication refill. He states his pain is located in his lower back radiating into his bilateral lower extremities. He rates his pain 6. His current exercise regime is walking and performing stretching exercises.  Mr.Basque Morphine equivalent is 120.00MME. Last UDS was Performed on 04/12/2018, it was consistent.    Pain Inventory Average Pain 5 Pain Right Now 6 My pain is constant, sharp, dull, stabbing and aching  In the last 24 hours, has pain interfered with the following? General activity 8 Relation with others 8 Enjoyment of life 9 What TIME of day is your pain at its worst? morning evening night Sleep (in general) Fair  Pain is worse with: walking, bending, sitting and standing Pain improves with: rest and medication Relief from Meds: 5  Mobility use a cane how many minutes can you walk? 5 ability to climb steps?  yes do you drive?  yes Do you have any goals in this area?  no  Function disabled: date disabled 2010 I need assistance with the following:  dressing, bathing, meal prep, household duties and shopping  Neuro/Psych weakness depression  Prior Studies Any changes since last visit?  no  Physicians involved in your care Any changes since last visit?  no   Family History  Problem Relation Age of Onset  . Hypertension Father    Social History   Socioeconomic History  . Marital status: Married    Spouse name: Not on file  . Number of children: Not on file  . Years of education: Not on file  . Highest education level: Not on file  Occupational History  . Not on file  Social Needs  . Financial resource strain: Not on file  . Food insecurity:    Worry: Not on file    Inability: Not on file  . Transportation needs:    Medical: Not on file   Non-medical: Not on file  Tobacco Use  . Smoking status: Never Smoker  . Smokeless tobacco: Never Used  Substance and Sexual Activity  . Alcohol use: No    Alcohol/week: 0.0 standard drinks  . Drug use: No  . Sexual activity: Not on file  Lifestyle  . Physical activity:    Days per week: Not on file    Minutes per session: Not on file  . Stress: Not on file  Relationships  . Social connections:    Talks on phone: Not on file    Gets together: Not on file    Attends religious service: Not on file    Active member of club or organization: Not on file    Attends meetings of clubs or organizations: Not on file    Relationship status: Not on file  Other Topics Concern  . Not on file  Social History Narrative  . Not on file   Past Surgical History:  Procedure Laterality Date  . BACK SURGERY     lower back surgery x 2   Past Medical History:  Diagnosis Date  . Arthritis    "some in my back"  . GERD (gastroesophageal reflux disease)   . Headache(784.0)    BP 107/72   Pulse 67   Ht 6\' 1"  (1.854 m)   Wt 288 lb (130.6 kg)   SpO2 98%  BMI 38.00 kg/m   Opioid Risk Score:   Fall Risk Score:  `1  Depression screen PHQ 2/9  Depression screen [facility] 2/9 06/14/2018 04/12/2018 04/05/2018 02/17/2018 02/15/2018 12/14/2017 03/26/2017  Decreased Interest 0 0 0 0 1 1 1   Down, Depressed, Hopeless 0 0 0 0 1 1 1   PHQ - 2 Score 0 0 0 0 2 2 2   Altered sleeping - - - - - - -  Tired, decreased energy - - - - - - -  Change in appetite - - - - - - -  Feeling bad or failure about yourself  - - - - - - -  Trouble concentrating - - - - - - -  Moving slowly or fidgety/restless - - - - - - -  Suicidal thoughts - - - - - - -  PHQ-9 Score - - - - - - -  Difficult doing work/chores - - - - - - -    Review of Systems  Constitutional: Negative.   HENT: Negative.   Eyes: Negative.   Respiratory: Negative.   Cardiovascular: Negative.   Gastrointestinal: Negative.   Endocrine: Negative.     Genitourinary: Negative.   Musculoskeletal: Negative.   Skin: Negative.   Allergic/Immunologic: Negative.   Neurological: Negative.   Hematological: Negative.   Psychiatric/Behavioral: Negative.   All other systems reviewed and are negative.      Objective:   Physical Exam Constitutional:      Appearance: Normal appearance.  Neck:     Musculoskeletal: Normal range of motion and neck supple.  Cardiovascular:     Rate and Rhythm: Normal rate and regular rhythm.     Pulses: Normal pulses.     Heart sounds: Normal heart sounds.  Pulmonary:     Effort: Pulmonary effort is normal.     Breath sounds: Normal breath sounds.  Musculoskeletal:     Comments: Normal Muscle Bulk and Muscle Testing Reveals:  Upper Extremities: Full ROM and Muscle Strength 5/5  Lumbar Hypersensitivity Mainly Right Side  Lower Extremities: Decreased ROM and Muscle Strength 5/5 Bilateral Lower Extremity Flexion Produces Pain into his Lower Back Mainly Right Side Arises from Table Slowly Using Cane for Support Antalgic Gait  Skin:    General: Skin is warm and dry.  Neurological:     Mental Status: He is alert and oriented to person, place, and time.  Psychiatric:        Mood and Affect: Mood normal.        Behavior: Behavior normal.           Assessment & Plan:  1. 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 He has lumbar degenerative disc disease. Continuecurrent medication regimen withGabapentin, Continue current Medication regimen. S/P  Left  Lumbar Facet Radiofrequency Ablation with sedation, on 04/05/2018 by Dr. Dossie Arbour with good results noted. 06/14/2018 Refilled: MS Contin30 mg one tablet every 12 hours #60 and Oxycodone 10mg  one tablet every 6 hours as needed for pain #120. We will continue the opioid monitoring program, this consists of regular clinic visits, examinations, urine drug screen, pill counts as well as use of [place] Controlled Substance  reporting System. 2. Sacroiliac Dysfunction: Continue with current medication, heat and exercise regime. 06/14/2018 3. Muscle Spasm:No complaints Today.Continueto Monitor.06/14/2018  20 mminutes of face to face patient care time was spent during this visit. All questions were encouraged and answered.   F/U in 1 month.

## 2018-06-20 ENCOUNTER — Telehealth: Payer: Self-pay | Admitting: Registered Nurse

## 2018-06-20 MED ORDER — MORPHINE SULFATE ER 30 MG PO TBCR: 30.0000 mg | EXTENDED_RELEASE_TABLET | Freq: Two times a day (BID) | ORAL | 0 refills | Status: DC

## 2018-06-20 MED ORDER — OXYCODONE HCL 10 MG PO TABS: 10.0000 mg | ORAL_TABLET | Freq: Four times a day (QID) | ORAL | 0 refills | Status: DC | PRN

## 2018-06-20 NOTE — Telephone Encounter (Signed)
Medication refill: PMP was Reviewed.

## 2018-07-19 ENCOUNTER — Encounter: Payer: PPO | Attending: Physical Medicine & Rehabilitation | Admitting: Registered Nurse

## 2018-07-19 VITALS — BP 125/84 | HR 70 | Ht 73.0 in | Wt 282.0 lb

## 2018-07-19 DIAGNOSIS — M79605 Pain in left leg: Secondary | ICD-10-CM | POA: Diagnosis not present

## 2018-07-19 DIAGNOSIS — M961 Postlaminectomy syndrome, not elsewhere classified: Secondary | ICD-10-CM

## 2018-07-19 DIAGNOSIS — M545 Low back pain: Secondary | ICD-10-CM | POA: Insufficient documentation

## 2018-07-19 DIAGNOSIS — G894 Chronic pain syndrome: Secondary | ICD-10-CM

## 2018-07-19 DIAGNOSIS — M5136 Other intervertebral disc degeneration, lumbar region: Secondary | ICD-10-CM

## 2018-07-19 DIAGNOSIS — Z79899 Other long term (current) drug therapy: Secondary | ICD-10-CM

## 2018-07-19 DIAGNOSIS — M5416 Radiculopathy, lumbar region: Secondary | ICD-10-CM | POA: Diagnosis not present

## 2018-07-19 DIAGNOSIS — M62830 Muscle spasm of back: Secondary | ICD-10-CM

## 2018-07-19 DIAGNOSIS — G8928 Other chronic postprocedural pain: Secondary | ICD-10-CM | POA: Diagnosis not present

## 2018-07-19 DIAGNOSIS — M79604 Pain in right leg: Secondary | ICD-10-CM | POA: Diagnosis not present

## 2018-07-19 DIAGNOSIS — Z981 Arthrodesis status: Secondary | ICD-10-CM | POA: Insufficient documentation

## 2018-07-19 DIAGNOSIS — M51369 Other intervertebral disc degeneration, lumbar region without mention of lumbar back pain or lower extremity pain: Secondary | ICD-10-CM

## 2018-07-19 DIAGNOSIS — Z5181 Encounter for therapeutic drug level monitoring: Secondary | ICD-10-CM | POA: Diagnosis not present

## 2018-07-19 MED ORDER — OXYCODONE HCL 10 MG PO TABS: 10.0000 mg | ORAL_TABLET | Freq: Four times a day (QID) | ORAL | 0 refills | Status: DC | PRN

## 2018-07-19 MED ORDER — MORPHINE SULFATE ER 30 MG PO TBCR: 30.0000 mg | EXTENDED_RELEASE_TABLET | Freq: Two times a day (BID) | ORAL | 0 refills | Status: DC

## 2018-07-19 NOTE — Progress Notes (Signed)
Subjective:    Patient ID: Samuel [name], male    DOB: [birth year], 43 y.o.   MRN: [id number]  HPI: Samuel [name] is a 43 y.o. male who returns for follow up appointment for chronic pain and medication refill. He states his pain is located in his lower back radiating into his bilateral lower extremities. He rates his pain 7. His current exercise regime is walking.  [name] Morphine equivalent is 120.00  MME. Last UDS was Performed on 04/12/2018, it was consistent.   Pain Inventory Average Pain 7 Pain Right Now 7 My pain is constant, sharp, burning, dull and aching  In the last 24 hours, has pain interfered with the following? General activity 9 Relation with others 8 Enjoyment of life 9 What TIME of day is your pain at its worst? morning evening night Sleep (in general) Fair  Pain is worse with: walking, bending, sitting and standing Pain improves with: rest and medication Relief from Meds: 5  Mobility use a cane how many minutes can you walk? 5 ability to climb steps?  yes do you drive?  yes  Function disabled: date disabled 2010 I need assistance with the following:  dressing, bathing, meal prep, household duties and shopping Do you have any goals in this area?  no  Neuro/Psych weakness depression  Prior Studies Any changes since last visit?  no  Physicians involved in your care Any changes since last visit?  no   Family History  Problem Relation Age of Onset  . Hypertension Father    Social History   Socioeconomic History  . Marital status: Married    Spouse name: Not on file  . Number of children: Not on file  . Years of education: Not on file  . Highest education level: Not on file  Occupational History  . Not on file  Social Needs  . Financial resource strain: Not on file  . Food insecurity:    Worry: Not on file    Inability: Not on file  . Transportation needs:    Medical: Not on file    Non-medical: Not on file  Tobacco Use  .  Smoking status: Never Smoker  . Smokeless tobacco: Never Used  Substance and Sexual Activity  . Alcohol use: No    Alcohol/week: 0.0 standard drinks  . Drug use: No  . Sexual activity: Not on file  Lifestyle  . Physical activity:    Days per week: Not on file    Minutes per session: Not on file  . Stress: Not on file  Relationships  . Social connections:    Talks on phone: Not on file    Gets together: Not on file    Attends religious service: Not on file    Active member of club or organization: Not on file    Attends meetings of clubs or organizations: Not on file    Relationship status: Not on file  Other Topics Concern  . Not on file  Social History Narrative  . Not on file   Past Surgical History:  Procedure Laterality Date  . BACK SURGERY     lower back surgery x 2   Past Medical History:  Diagnosis Date  . Arthritis    "some in my back"  . GERD (gastroesophageal reflux disease)   . Headache(784.0)    BP 125/84   Pulse 70   Ht 6\' 1"  (1.854 m)   Wt 282 lb (127.9 kg)   SpO2 96%  BMI 37.21 kg/m   Opioid Risk Score:   Fall Risk Score:  `1  Depression screen PHQ 2/9  Depression screen [facility] 2/9 06/14/2018 04/12/2018 04/05/2018 02/17/2018 02/15/2018 12/14/2017 03/26/2017  Decreased Interest 0 0 0 0 1 1 1   Down, Depressed, Hopeless 0 0 0 0 1 1 1   PHQ - 2 Score 0 0 0 0 2 2 2   Altered sleeping - - - - - - -  Tired, decreased energy - - - - - - -  Change in appetite - - - - - - -  Feeling bad or failure about yourself  - - - - - - -  Trouble concentrating - - - - - - -  Moving slowly or fidgety/restless - - - - - - -  Suicidal thoughts - - - - - - -  PHQ-9 Score - - - - - - -  Difficult doing work/chores - - - - - - -    Review of Systems  Constitutional: Negative.   HENT: Negative.   Eyes: Negative.   Respiratory: Negative.   Cardiovascular: Negative.   Gastrointestinal: Negative.   Endocrine: Negative.   Genitourinary: Negative.   Musculoskeletal:  Negative.   Skin: Negative.   Allergic/Immunologic: Negative.   Neurological: Negative.   Hematological: Negative.   Psychiatric/Behavioral: Negative.   All other systems reviewed and are negative.      Objective:   Physical Exam Vitals signs and nursing note reviewed.  Constitutional:      Appearance: Normal appearance.  Neck:     Musculoskeletal: Normal range of motion and neck supple.  Cardiovascular:     Rate and Rhythm: Normal rate and regular rhythm.     Pulses: Normal pulses.     Heart sounds: Normal heart sounds.  Pulmonary:     Effort: Pulmonary effort is normal.     Breath sounds: Normal breath sounds.  Musculoskeletal:     Comments: Normal Muscle Bulk and Muscle Testing Reveals:  Upper Extremities: Full ROM and Muscle Strength 5/5  Lumbar Hypersensitivity Lower Extremities: Full ROM and Muscle Strength 5/5 Arises from Table Slowly  AntalgicGait   Skin:    General: Skin is warm and dry.  Neurological:     Mental Status: He is alert and oriented to person, place, and time.  Psychiatric:        Mood and Affect: Mood normal.        Behavior: Behavior normal.           Assessment & Plan:  1. 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 He has lumbar degenerative disc disease. Continuecurrent medication regimen withGabapentin, Continue current Medication regimen. S/P Left Lumbar Facet Radiofrequency Ablation with sedation, on 11/12/2019by Dr. Dossie Arbour with good results noted. 07/19/2018 Refilled: MS Contin30 mg one tablet every 12 hours #60 and Oxycodone 10mg  one tablet every 6 hours as needed for pain #120. We will continue the opioid monitoring program, this consists of regular clinic visits, examinations, urine drug screen, pill counts as well as use of [place] Controlled Substance reporting System. 2. Sacroiliac Dysfunction: Continue with current medication, heat and exercise regime.07/19/2018 3. Muscle Spasm:No  complaints Today.Continueto Monitor.07/19/2018  20 mminutes of face to face patient care time was spent during this visit. All questions were encouraged and answered.   F/U in 1 month.

## 2018-07-20 ENCOUNTER — Encounter: Payer: Self-pay | Admitting: Registered Nurse

## 2018-07-24 DIAGNOSIS — H1013 Acute atopic conjunctivitis, bilateral: Secondary | ICD-10-CM | POA: Diagnosis not present

## 2018-07-24 DIAGNOSIS — H40033 Anatomical narrow angle, bilateral: Secondary | ICD-10-CM | POA: Diagnosis not present

## 2018-08-16 ENCOUNTER — Encounter: Payer: PPO | Admitting: Registered Nurse

## 2018-08-16 ENCOUNTER — Other Ambulatory Visit: Payer: Self-pay

## 2018-08-16 ENCOUNTER — Encounter: Payer: PPO | Attending: Physical Medicine & Rehabilitation | Admitting: Registered Nurse

## 2018-08-16 DIAGNOSIS — G8928 Other chronic postprocedural pain: Secondary | ICD-10-CM | POA: Insufficient documentation

## 2018-08-16 DIAGNOSIS — M79604 Pain in right leg: Secondary | ICD-10-CM | POA: Insufficient documentation

## 2018-08-16 DIAGNOSIS — M62830 Muscle spasm of back: Secondary | ICD-10-CM

## 2018-08-16 DIAGNOSIS — M961 Postlaminectomy syndrome, not elsewhere classified: Secondary | ICD-10-CM

## 2018-08-16 DIAGNOSIS — Z981 Arthrodesis status: Secondary | ICD-10-CM | POA: Insufficient documentation

## 2018-08-16 DIAGNOSIS — M545 Low back pain: Secondary | ICD-10-CM | POA: Insufficient documentation

## 2018-08-16 DIAGNOSIS — M5416 Radiculopathy, lumbar region: Secondary | ICD-10-CM

## 2018-08-16 DIAGNOSIS — M79605 Pain in left leg: Secondary | ICD-10-CM | POA: Insufficient documentation

## 2018-08-16 DIAGNOSIS — G894 Chronic pain syndrome: Secondary | ICD-10-CM

## 2018-08-16 DIAGNOSIS — M51369 Other intervertebral disc degeneration, lumbar region without mention of lumbar back pain or lower extremity pain: Secondary | ICD-10-CM

## 2018-08-16 DIAGNOSIS — Z79899 Other long term (current) drug therapy: Secondary | ICD-10-CM

## 2018-08-16 DIAGNOSIS — M5136 Other intervertebral disc degeneration, lumbar region: Secondary | ICD-10-CM

## 2018-08-16 DIAGNOSIS — Z5181 Encounter for therapeutic drug level monitoring: Secondary | ICD-10-CM

## 2018-08-16 MED ORDER — TIZANIDINE HCL 2 MG PO TABS: 2.0000 mg | ORAL_TABLET | Freq: Three times a day (TID) | ORAL | 2 refills | Status: DC | PRN

## 2018-08-16 MED ORDER — IBUPROFEN 800 MG PO TABS: 800.0000 mg | ORAL_TABLET | Freq: Three times a day (TID) | ORAL | 2 refills | Status: DC | PRN

## 2018-08-16 MED ORDER — GABAPENTIN 300 MG PO CAPS: ORAL_CAPSULE | ORAL | 2 refills | Status: DC

## 2018-08-16 MED ORDER — OXYCODONE HCL 10 MG PO TABS: 10.0000 mg | ORAL_TABLET | Freq: Four times a day (QID) | ORAL | 0 refills | Status: DC | PRN

## 2018-08-16 MED ORDER — MORPHINE SULFATE ER 30 MG PO TBCR: 30.0000 mg | EXTENDED_RELEASE_TABLET | Freq: Two times a day (BID) | ORAL | 0 refills | Status: DC

## 2018-08-17 NOTE — Progress Notes (Signed)
This Provider placed a call to [name]  a 43 y.o. male, his appointment was changed to a virtual office visit to reduce the risk of exposure to the COVID-19 virus and to help him remain healthy and safe. The virtual visit will also provide continuity of care he verbalizes understanding. He states his pain is located in his lower back radiating into his bilateral lower extremities. He rates his pain 6. His current exercise regime is walking. Samuel [name] Morphine equivalent is 120.00 MME. Last UDS was Performed on 04/12/2018, it was consistent.   Assessment and Plan:  1. 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 He has lumbar degenerative disc disease. Continuecurrent medication regimen withGabapentin, Continue current Medication regimen. S/P Left Lumbar Facet Radiofrequency Ablation with sedation, on 11/12/2019by Dr. Dossie Arbour with good results noted.08/16/2018 Refilled: MS Contin30 mg one tablet every 12 hours #60 and Oxycodone 10mg  one tablet every 6 hours as needed for pain #120. We will continue the opioid monitoring program, this consists of regular clinic visits, examinations, urine drug screen, pill counts as well as use of [place] Controlled Substance reporting System. 2. Sacroiliac Dysfunction: Continue with current medication, heat and exercise regime.08/16/2018 3. Muscle Spasm:Continue Tizanidine and Continue to Monitor.08/16/2018  20 minutes of face to face patient care time was spent during this visit. All questions were encouraged and answered.   F/U in 1 month.

## 2018-09-13 ENCOUNTER — Encounter: Payer: PPO | Attending: Physical Medicine & Rehabilitation | Admitting: Registered Nurse

## 2018-09-13 ENCOUNTER — Other Ambulatory Visit: Payer: Self-pay

## 2018-09-13 ENCOUNTER — Encounter: Payer: Self-pay | Admitting: Registered Nurse

## 2018-09-13 VITALS — Ht 73.0 in | Wt 300.0 lb

## 2018-09-13 DIAGNOSIS — Z79899 Other long term (current) drug therapy: Secondary | ICD-10-CM | POA: Diagnosis not present

## 2018-09-13 DIAGNOSIS — M79605 Pain in left leg: Secondary | ICD-10-CM | POA: Insufficient documentation

## 2018-09-13 DIAGNOSIS — M62830 Muscle spasm of back: Secondary | ICD-10-CM | POA: Diagnosis not present

## 2018-09-13 DIAGNOSIS — M961 Postlaminectomy syndrome, not elsewhere classified: Secondary | ICD-10-CM | POA: Diagnosis not present

## 2018-09-13 DIAGNOSIS — G894 Chronic pain syndrome: Secondary | ICD-10-CM | POA: Diagnosis not present

## 2018-09-13 DIAGNOSIS — M545 Low back pain: Secondary | ICD-10-CM | POA: Insufficient documentation

## 2018-09-13 DIAGNOSIS — G8928 Other chronic postprocedural pain: Secondary | ICD-10-CM | POA: Insufficient documentation

## 2018-09-13 DIAGNOSIS — M5136 Other intervertebral disc degeneration, lumbar region: Secondary | ICD-10-CM | POA: Diagnosis not present

## 2018-09-13 DIAGNOSIS — Z5181 Encounter for therapeutic drug level monitoring: Secondary | ICD-10-CM

## 2018-09-13 DIAGNOSIS — M5416 Radiculopathy, lumbar region: Secondary | ICD-10-CM | POA: Diagnosis not present

## 2018-09-13 DIAGNOSIS — M79604 Pain in right leg: Secondary | ICD-10-CM | POA: Insufficient documentation

## 2018-09-13 DIAGNOSIS — Z981 Arthrodesis status: Secondary | ICD-10-CM | POA: Insufficient documentation

## 2018-09-13 MED ORDER — MORPHINE SULFATE ER 30 MG PO TBCR: 30.0000 mg | EXTENDED_RELEASE_TABLET | Freq: Two times a day (BID) | ORAL | 0 refills | Status: DC

## 2018-09-13 MED ORDER — OXYCODONE HCL 10 MG PO TABS: 10.0000 mg | ORAL_TABLET | Freq: Four times a day (QID) | ORAL | 0 refills | Status: DC | PRN

## 2018-09-13 NOTE — Progress Notes (Signed)
Subjective:    Patient ID: Samuel [name], male    DOB: 04-Dec-1975, 43 y.o.   MRN: [id number]  HPI: Samuel [name] is a 43 y.o. male his appointment was changed, due to national recommendations of social distancing due to [place] 19, an audio/video telehealth visit is felt to be most appropriate for this patient at this time.  See Chart message from today for the patient's consent to telehealth from [place] [facility].   He states his pain is located in his lower back radiating into his bilateral lower extremities. He rates his pain 6. His current exercise regime is walking.   Samuel [name] equivalent is 120.00 MME.  Last UDS was Performed on 04/12/2018, it was consistent.   Samuel [name] asked the Health and History Questions. This provider and Samuel [name] verified we were speaking with the correct person using two identifiers.  Pain Inventory Average Pain 6 Pain Right Now 6 My pain is sharp, dull and aching  In the last 24 hours, has pain interfered with the following? General activity 6 Relation with others 6 Enjoyment of life 6 What TIME of day is your pain at its worst? morning Sleep (in general) Fair  Pain is worse with: walking, bending, sitting and standing Pain improves with: rest, heat/ice and medication Relief from Meds: 6  Mobility walk with assistance use a cane ability to climb steps?  yes do you drive?  yes  Function disabled: date disabled 2010 I need assistance with the following:  dressing, bathing, meal prep, household duties and shopping  Neuro/Psych weakness trouble walking spasms depression  Prior Studies Any changes since last visit?  no  Physicians involved in your care Any changes since last visit?  no   Family History  Problem Relation Age of Onset  . Hypertension Father    Social History   Socioeconomic History  . Marital status: Married    Spouse name: Not on file  . Number of children:  Not on file  . Years of education: Not on file  . Highest education level: Not on file  Occupational History  . Not on file  Social Needs  . Financial resource strain: Not on file  . Food insecurity:    Worry: Not on file    Inability: Not on file  . Transportation needs:    Medical: Not on file    Non-medical: Not on file  Tobacco Use  . Smoking status: Never Smoker  . Smokeless tobacco: Never Used  Substance and Sexual Activity  . Alcohol use: No    Alcohol/week: 0.0 standard drinks  . Drug use: No  . Sexual activity: Not on file  Lifestyle  . Physical activity:    Days per week: Not on file    Minutes per session: Not on file  . Stress: Not on file  Relationships  . Social connections:    Talks on phone: Not on file    Gets together: Not on file    Attends religious service: Not on file    Active member of club or organization: Not on file    Attends meetings of clubs or organizations: Not on file    Relationship status: Not on file  Other Topics Concern  . Not on file  Social History Narrative  . Not on file   Past Surgical History:  Procedure Laterality Date  . BACK SURGERY     lower back surgery x 2   Past  Medical History:  Diagnosis Date  . Arthritis    "some in my back"  . GERD (gastroesophageal reflux disease)   . Headache(784.0)    There were no vitals taken for this visit.  Opioid Risk Score:   Fall Risk Score:  `1  Depression screen PHQ 2/9  Depression screen [facility] 2/9 06/14/2018 04/12/2018 04/05/2018 02/17/2018 02/15/2018 12/14/2017 03/26/2017  Decreased Interest 0 0 0 0 1 1 1   Down, Depressed, Hopeless 0 0 0 0 1 1 1   PHQ - 2 Score 0 0 0 0 2 2 2   Altered sleeping - - - - - - -  Tired, decreased energy - - - - - - -  Change in appetite - - - - - - -  Feeling bad or failure about yourself  - - - - - - -  Trouble concentrating - - - - - - -  Moving slowly or fidgety/restless - - - - - - -  Suicidal thoughts - - - - - - -  PHQ-9 Score - - - - - -  -  Difficult doing work/chores - - - - - - -     Review of Systems  Constitutional: Negative.   HENT: Negative.   Eyes: Negative.   Respiratory: Negative.   Cardiovascular: Negative.   Gastrointestinal: Negative.   Endocrine: Negative.   Genitourinary: Negative.   Musculoskeletal: Positive for arthralgias, back pain, gait problem and myalgias.  Skin: Negative.   Allergic/Immunologic: Positive for environmental allergies.  Neurological: Positive for weakness.  Hematological: Negative.   Psychiatric/Behavioral: Negative.   All other systems reviewed and are negative.      Objective:   Physical Exam Vitals signs and nursing note reviewed.  Musculoskeletal:     Comments: No Physical Exam: Virtual Visit  Neurological:     Mental Status: He is oriented to person, place, and time.           Assessment & Plan:  1. 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 He has lumbar degenerative disc disease. Continuecurrent medication regimen withGabapentin, Continue current Medication regimen. S/P Left Lumbar Facet Radiofrequency Ablation with sedation, on 11/12/2019by Dr. Dossie Arbour with good results noted.09/13/2018 Refilled: MS Contin30 mg one tablet every 12 hours #60 and Oxycodone 10mg  one tablet every 6 hours as needed for pain #120. We will continue the opioid monitoring program, this consists of regular clinic visits, examinations, urine drug screen, pill counts as well as use of [place] Controlled Substance reporting System. 2. Sacroiliac Dysfunction: Continue with current medication, heat and exercise regime.09/13/2018 3. Muscle Spasm:No complaints Today.Continue Tizanidine s needed. Continueto Monitor.09/13/2018  F/U in 1 month. Webex Location of patient: In his Home Location of provider: Office Established patient Time spent on call: 10 Minutes

## 2018-10-11 ENCOUNTER — Encounter: Payer: Self-pay | Admitting: Registered Nurse

## 2018-10-11 ENCOUNTER — Other Ambulatory Visit: Payer: Self-pay

## 2018-10-11 ENCOUNTER — Encounter: Payer: PPO | Attending: Physical Medicine & Rehabilitation | Admitting: Registered Nurse

## 2018-10-11 VITALS — Ht 73.0 in | Wt 300.0 lb

## 2018-10-11 DIAGNOSIS — G8928 Other chronic postprocedural pain: Secondary | ICD-10-CM | POA: Insufficient documentation

## 2018-10-11 DIAGNOSIS — M961 Postlaminectomy syndrome, not elsewhere classified: Secondary | ICD-10-CM

## 2018-10-11 DIAGNOSIS — Z5181 Encounter for therapeutic drug level monitoring: Secondary | ICD-10-CM

## 2018-10-11 DIAGNOSIS — Z79899 Other long term (current) drug therapy: Secondary | ICD-10-CM

## 2018-10-11 DIAGNOSIS — M5136 Other intervertebral disc degeneration, lumbar region: Secondary | ICD-10-CM

## 2018-10-11 DIAGNOSIS — M5416 Radiculopathy, lumbar region: Secondary | ICD-10-CM

## 2018-10-11 DIAGNOSIS — M79605 Pain in left leg: Secondary | ICD-10-CM | POA: Insufficient documentation

## 2018-10-11 DIAGNOSIS — M545 Low back pain: Secondary | ICD-10-CM | POA: Insufficient documentation

## 2018-10-11 DIAGNOSIS — G894 Chronic pain syndrome: Secondary | ICD-10-CM

## 2018-10-11 DIAGNOSIS — M51369 Other intervertebral disc degeneration, lumbar region without mention of lumbar back pain or lower extremity pain: Secondary | ICD-10-CM

## 2018-10-11 DIAGNOSIS — M62830 Muscle spasm of back: Secondary | ICD-10-CM | POA: Diagnosis not present

## 2018-10-11 DIAGNOSIS — M79604 Pain in right leg: Secondary | ICD-10-CM | POA: Insufficient documentation

## 2018-10-11 DIAGNOSIS — Z981 Arthrodesis status: Secondary | ICD-10-CM | POA: Insufficient documentation

## 2018-10-11 MED ORDER — OXYCODONE HCL 10 MG PO TABS: 10.0000 mg | ORAL_TABLET | Freq: Four times a day (QID) | ORAL | 0 refills | Status: DC | PRN

## 2018-10-11 MED ORDER — MORPHINE SULFATE ER 30 MG PO TBCR: 30.0000 mg | EXTENDED_RELEASE_TABLET | Freq: Two times a day (BID) | ORAL | 0 refills | Status: DC

## 2018-10-11 NOTE — Progress Notes (Signed)
Subjective:    Patient ID: Samuel [name], male    DOB: [birth year], 43 y.o.   MRN: [id number]  HPI: Samuel [name] is a 43 y.o. male his appointment was changed, due to national recommendations of social distancing due to [place] 19, an audio/video telehealth visit is felt to be most appropriate for this patient at this time.  See Chart message from today for the patient's consent to telehealth from [place].     He states his pain is located in his lower back radiating into his bilateral lower extremities. Also reports increase intensity of lower back pain with radiculitis, he was instructed to call Dr. Dossie Arbour to scheduled  Radiofrequency, he verbalizes understanding. He rates his pain 6. His current exercise regime is walking and performing stretching exercises.  Samuel [name] equivalent is 120.00 MME. Last UDs was performed on 04/12/2018, it was consistent.   [name] CMA asked the health and history questions. This provider and [name] verified we were speaking with the correct person using two identifiers.   Pain Inventory Average Pain 6 Pain Right Now 6 My pain is constant, sharp, dull, stabbing and aching  In the last 24 hours, has pain interfered with the following? General activity 8 Relation with others 9 Enjoyment of life 9 What TIME of day is your pain at its worst? all Sleep (in general) Fair  Pain is worse with: walking, bending, sitting, inactivity, standing and some activites Pain improves with: rest and medication Relief from Meds: 5  Mobility walk with assistance use a cane how many minutes can you walk? 5 ability to climb steps?  yes do you drive?  yes  Function disabled: date disabled .  Neuro/Psych weakness trouble walking spasms depression  Prior Studies Any changes since last visit?  no  Physicians involved in your care Any changes since last visit?  no   Family History  Problem  Relation Age of Onset  . Hypertension Father    Social History   Socioeconomic History  . Marital status: Married    Spouse name: Not on file  . Number of children: Not on file  . Years of education: Not on file  . Highest education level: Not on file  Occupational History  . Not on file  Social Needs  . Financial resource strain: Not on file  . Food insecurity:    Worry: Not on file    Inability: Not on file  . Transportation needs:    Medical: Not on file    Non-medical: Not on file  Tobacco Use  . Smoking status: Never Smoker  . Smokeless tobacco: Never Used  Substance and Sexual Activity  . Alcohol use: No    Alcohol/week: 0.0 standard drinks  . Drug use: No  . Sexual activity: Not on file  Lifestyle  . Physical activity:    Days per week: Not on file    Minutes per session: Not on file  . Stress: Not on file  Relationships  . Social connections:    Talks on phone: Not on file    Gets together: Not on file    Attends religious service: Not on file    Active member of club or organization: Not on file    Attends meetings of clubs or organizations: Not on file    Relationship status: Not on file  Other Topics Concern  . Not on file  Social History Narrative  . Not on file  Past Surgical History:  Procedure Laterality Date  . BACK SURGERY     lower back surgery x 2   Past Medical History:  Diagnosis Date  . Arthritis    "some in my back"  . GERD (gastroesophageal reflux disease)   . Headache(784.0)    Ht 6\' 1"  (1.854 m)   Wt 300 lb (136.1 kg)   BMI 39.58 kg/m   Opioid Risk Score:   Fall Risk Score:  `1  Depression screen PHQ 2/9  Depression screen [facility] 2/9 06/14/2018 04/12/2018 04/05/2018 02/17/2018 02/15/2018 12/14/2017 03/26/2017  Decreased Interest 0 0 0 0 1 1 1   Down, Depressed, Hopeless 0 0 0 0 1 1 1   PHQ - 2 Score 0 0 0 0 2 2 2   Altered sleeping - - - - - - -  Tired, decreased energy - - - - - - -  Change in appetite - - - - - - -  Feeling  bad or failure about yourself  - - - - - - -  Trouble concentrating - - - - - - -  Moving slowly or fidgety/restless - - - - - - -  Suicidal thoughts - - - - - - -  PHQ-9 Score - - - - - - -  Difficult doing work/chores - - - - - - -    Review of Systems  Constitutional: Negative.   HENT: Negative.   Eyes: Negative.   Cardiovascular: Negative.   Gastrointestinal: Negative.   Genitourinary: Negative.   Musculoskeletal: Positive for back pain and gait problem.       Spasms  Skin: Negative.   Allergic/Immunologic: Negative.   Neurological: Positive for weakness.  Hematological: Negative.   Psychiatric/Behavioral: Positive for dysphoric mood.  All other systems reviewed and are negative.      Objective:   Physical Exam Vitals signs and nursing note reviewed.  Musculoskeletal:     Comments: No Physical Exam Performed: Virtual Visit  Neurological:     Mental Status: He is oriented to person, place, and time.           Assessment & Plan:  1. 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 He has lumbar degenerative disc disease. Continuecurrent medication regimen withGabapentin, Continue current Medication regimen. S/P Left Lumbar Facet Radiofrequency Ablation with sedation, on 11/12/2019by Dr. Dossie Arbour with good results noted.10/11/2018 Refilled: MS Contin30 mg one tablet every 12 hours #60 and Oxycodone 10mg  one tablet every 6 hours as needed for pain #120. We will continue the opioid monitoring program, this consists of regular clinic visits, examinations, urine drug screen, pill counts as well as use of [place] Controlled Substance reporting System. 2. Sacroiliac Dysfunction: Continue with current medication, heat and exercise regime.10/11/2018 3. Muscle Spasm:No complaints Today.Continue Tizanidine s needed. Continueto Monitor.10/11/2018  F/U in 1 Month  [employer] Location of patient: In his Home Location of provider:  Office Established patient Time spent on call:10 Minutes

## 2018-11-15 ENCOUNTER — Encounter: Payer: Self-pay | Admitting: Registered Nurse

## 2018-11-15 ENCOUNTER — Encounter: Payer: PPO | Attending: Physical Medicine & Rehabilitation | Admitting: Registered Nurse

## 2018-11-15 ENCOUNTER — Other Ambulatory Visit: Payer: Self-pay

## 2018-11-15 VITALS — BP 118/74 | HR 63 | Temp 98.3°F | Wt 303.0 lb

## 2018-11-15 DIAGNOSIS — M62830 Muscle spasm of back: Secondary | ICD-10-CM | POA: Diagnosis not present

## 2018-11-15 DIAGNOSIS — M79604 Pain in right leg: Secondary | ICD-10-CM | POA: Diagnosis not present

## 2018-11-15 DIAGNOSIS — G894 Chronic pain syndrome: Secondary | ICD-10-CM

## 2018-11-15 DIAGNOSIS — M545 Low back pain: Secondary | ICD-10-CM | POA: Diagnosis not present

## 2018-11-15 DIAGNOSIS — M5136 Other intervertebral disc degeneration, lumbar region: Secondary | ICD-10-CM | POA: Diagnosis not present

## 2018-11-15 DIAGNOSIS — G8928 Other chronic postprocedural pain: Secondary | ICD-10-CM | POA: Insufficient documentation

## 2018-11-15 DIAGNOSIS — Z5181 Encounter for therapeutic drug level monitoring: Secondary | ICD-10-CM | POA: Diagnosis not present

## 2018-11-15 DIAGNOSIS — M79605 Pain in left leg: Secondary | ICD-10-CM | POA: Diagnosis not present

## 2018-11-15 DIAGNOSIS — M961 Postlaminectomy syndrome, not elsewhere classified: Secondary | ICD-10-CM | POA: Diagnosis not present

## 2018-11-15 DIAGNOSIS — M5416 Radiculopathy, lumbar region: Secondary | ICD-10-CM

## 2018-11-15 DIAGNOSIS — Z981 Arthrodesis status: Secondary | ICD-10-CM | POA: Insufficient documentation

## 2018-11-15 DIAGNOSIS — Z79899 Other long term (current) drug therapy: Secondary | ICD-10-CM

## 2018-11-15 MED ORDER — MORPHINE SULFATE ER 30 MG PO TBCR: 30.0000 mg | EXTENDED_RELEASE_TABLET | Freq: Two times a day (BID) | ORAL | 0 refills | Status: DC

## 2018-11-15 MED ORDER — OXYCODONE HCL 10 MG PO TABS: 10.0000 mg | ORAL_TABLET | Freq: Four times a day (QID) | ORAL | 0 refills | Status: DC | PRN

## 2018-11-15 MED ORDER — TIZANIDINE HCL 2 MG PO TABS: 2.0000 mg | ORAL_TABLET | Freq: Three times a day (TID) | ORAL | 2 refills | Status: DC | PRN

## 2018-11-15 NOTE — Progress Notes (Signed)
Subjective:    Patient ID: Biff Rutigliano, male    DOB: 02-Nov-1975, 43 y.o.   MRN: [id number]  HPI: Dailyn Kempner is a 43 y.o. male who returns for follow up appointment for chronic pain and medication refill. He states his pain is located in his mid- lower back radiating into his bilateral lower extremities. He rates his pain 7. His current exercise regime is walking and performing stretching exercises.  Mr. Shampine Morphine equivalent is 120.00 MME.  Last UDS was Performed on 04/12/2018, it was consistent.    Pain Inventory Average Pain 7 Pain Right Now 7 My pain is constant, sharp, dull, stabbing and aching  In the last 24 hours, has pain interfered with the following? General activity 8 Relation with others 9 Enjoyment of life 9 What TIME of day is your pain at its worst? morning, evening Sleep (in general) Fair  Pain is worse with: walking, bending, sitting and standing Pain improves with: rest and medication Relief from Meds: 5  Mobility use a cane ability to climb steps?  yes do you drive?  yes  Function disabled: date disabled 2010 I need assistance with the following:  dressing, bathing, meal prep, household duties and shopping Do you have any goals in this area?  no  Neuro/Psych weakness trouble walking spasms depression  Prior Studies n/a  Physicians involved in your care n/a   Family History  Problem Relation Age of Onset  . Hypertension Father    Social History   Socioeconomic History  . Marital status: Married    Spouse name: Not on file  . Number of children: Not on file  . Years of education: Not on file  . Highest education level: Not on file  Occupational History  . Not on file  Social Needs  . Financial resource strain: Not on file  . Food insecurity    Worry: Not on file    Inability: Not on file  . Transportation needs    Medical: Not on file    Non-medical: Not on file  Tobacco Use  . Smoking status: Never Smoker  .  Smokeless tobacco: Never Used  Substance and Sexual Activity  . Alcohol use: No    Alcohol/week: 0.0 standard drinks  . Drug use: No  . Sexual activity: Not on file  Lifestyle  . Physical activity    Days per week: Not on file    Minutes per session: Not on file  . Stress: Not on file  Relationships  . Social Herbalist on phone: Not on file    Gets together: Not on file    Attends religious service: Not on file    Active member of club or organization: Not on file    Attends meetings of clubs or organizations: Not on file    Relationship status: Not on file  Other Topics Concern  . Not on file  Social History Narrative  . Not on file   Past Surgical History:  Procedure Laterality Date  . BACK SURGERY     lower back surgery x 2   Past Medical History:  Diagnosis Date  . Arthritis    "some in my back"  . GERD (gastroesophageal reflux disease)   . Headache(784.0)    There were no vitals taken for this visit.  Opioid Risk Score:   Fall Risk Score:  `1  Depression screen [facility] 2/9  Depression screen [facility] 2/9 06/14/2018 04/12/2018 04/05/2018 02/17/2018 02/15/2018 12/14/2017 03/26/2017  Decreased Interest 0 0 0 0 1 1 1   Down, Depressed, Hopeless 0 0 0 0 1 1 1   PHQ - 2 Score 0 0 0 0 2 2 2   Altered sleeping - - - - - - -  Tired, decreased energy - - - - - - -  Change in appetite - - - - - - -  Feeling bad or failure about yourself  - - - - - - -  Trouble concentrating - - - - - - -  Moving slowly or fidgety/restless - - - - - - -  Suicidal thoughts - - - - - - -  PHQ-9 Score - - - - - - -  Difficult doing work/chores - - - - - - -      Review of Systems     Objective:   Physical Exam Vitals signs and nursing note reviewed.  Constitutional:      Appearance: Normal appearance.  Neck:     Musculoskeletal: Normal range of motion and neck supple.  Cardiovascular:     Rate and Rhythm: Normal rate and regular rhythm.     Pulses: Normal pulses.     Heart  sounds: Normal heart sounds.  Pulmonary:     Effort: Pulmonary effort is normal.     Breath sounds: Normal breath sounds.  Musculoskeletal:     Comments: Normal Muscle Bulk and Muscle Testing Reveals:  Upper Extremities: Full ROM and Muscle Strength 5/5  Lumbar Paraspinal Tenderness: L-4-L-5 Lower Extremities: Full ROM and Muscle Strength 5/5 Arises from table slowly using cane for support Antalgic  Gait   Skin:    General: Skin is warm and dry.  Neurological:     Mental Status: He is alert and oriented to person, place, and time.  Psychiatric:        Mood and Affect: Mood normal.        Behavior: Behavior normal.           Assessment & Plan:  1. 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 He has lumbar degenerative disc disease. Continuecurrent medication regimen withGabapentin, Continue current Medication regimen. S/P Left Lumbar Facet Radiofrequency Ablation with sedation, on 11/12/2019by Dr. Dossie Arbour with good results noted.11/15/2018 Refilled: MS Contin30 mg one tablet every 12 hours #60 and Oxycodone 10mg  one tablet every 6 hours as needed for pain #120. We will continue the opioid monitoring program, this consists of regular clinic visits, examinations, urine drug screen, pill counts as well as use of [place] Controlled Substance reporting System. 2. Sacroiliac Dysfunction: Continue with current medication, heat and exercise regime.11/14/2018 3. Muscle Spasm:Continue Tizanidine s needed.Continueto Monitor.11/14/2018  F/U in 1 Month

## 2018-12-08 ENCOUNTER — Other Ambulatory Visit: Payer: Self-pay

## 2018-12-08 ENCOUNTER — Encounter: Payer: Self-pay | Admitting: Registered Nurse

## 2018-12-08 ENCOUNTER — Encounter: Payer: PPO | Attending: Physical Medicine & Rehabilitation | Admitting: Registered Nurse

## 2018-12-08 VITALS — BP 125/72 | HR 70 | Temp 97.7°F | Resp 12 | Ht 73.0 in | Wt 303.0 lb

## 2018-12-08 DIAGNOSIS — M79604 Pain in right leg: Secondary | ICD-10-CM | POA: Insufficient documentation

## 2018-12-08 DIAGNOSIS — M5136 Other intervertebral disc degeneration, lumbar region: Secondary | ICD-10-CM | POA: Diagnosis not present

## 2018-12-08 DIAGNOSIS — M62830 Muscle spasm of back: Secondary | ICD-10-CM

## 2018-12-08 DIAGNOSIS — M961 Postlaminectomy syndrome, not elsewhere classified: Secondary | ICD-10-CM | POA: Diagnosis not present

## 2018-12-08 DIAGNOSIS — M545 Low back pain: Secondary | ICD-10-CM | POA: Insufficient documentation

## 2018-12-08 DIAGNOSIS — G894 Chronic pain syndrome: Secondary | ICD-10-CM

## 2018-12-08 DIAGNOSIS — M5416 Radiculopathy, lumbar region: Secondary | ICD-10-CM

## 2018-12-08 DIAGNOSIS — G8928 Other chronic postprocedural pain: Secondary | ICD-10-CM | POA: Insufficient documentation

## 2018-12-08 DIAGNOSIS — M79605 Pain in left leg: Secondary | ICD-10-CM | POA: Insufficient documentation

## 2018-12-08 DIAGNOSIS — Z5181 Encounter for therapeutic drug level monitoring: Secondary | ICD-10-CM

## 2018-12-08 DIAGNOSIS — Z79899 Other long term (current) drug therapy: Secondary | ICD-10-CM

## 2018-12-08 DIAGNOSIS — Z981 Arthrodesis status: Secondary | ICD-10-CM | POA: Insufficient documentation

## 2018-12-08 MED ORDER — MORPHINE SULFATE ER 30 MG PO TBCR: 30.0000 mg | EXTENDED_RELEASE_TABLET | Freq: Two times a day (BID) | ORAL | 0 refills | Status: DC

## 2018-12-08 MED ORDER — OXYCODONE HCL 10 MG PO TABS: 10.0000 mg | ORAL_TABLET | Freq: Four times a day (QID) | ORAL | 0 refills | Status: DC | PRN

## 2018-12-08 NOTE — Progress Notes (Signed)
Subjective:    Patient ID: Samuel [name], male    DOB: 06-30-1975, 43 y.o.   MRN: [id number]  HPI: Samuel [name] is a 43 y.o. male who returns for follow up appointment for chronic pain and medication refill. He states his pain is located in his mid- lower-back radiating into his bilateral lower extremities He rates his pain 6. His current exercise regime is walking and performing stretching exercises.  Mr. Mcaffee Morphine equivalent is  120.00  MME.  Last UDS was Performed on 04/12/2018, it was consistent.    Pain Inventory Average Pain 6 Pain Right Now 6 My pain is constant, sharp, dull, stabbing and aching  In the last 24 hours, has pain interfered with the following? General activity 8 Relation with others 9 Enjoyment of life 9 What TIME of day is your pain at its worst? night, morning,evening Sleep (in general) Fair  Pain is worse with: walking, bending, sitting, inactivity and standing Pain improves with: rest and medication Relief from Meds: 5  Mobility use a cane ability to climb steps?  yes do you drive?  yes  Function disabled: date disabled 2010 I need assistance with the following:  dressing, bathing, meal prep, household duties and shopping  Neuro/Psych weakness spasms depression  Prior Studies n/a  Physicians involved in your care n/a   Family History  Problem Relation Age of Onset  . Hypertension Father    Social History   Socioeconomic History  . Marital status: Married    Spouse name: Not on file  . Number of children: Not on file  . Years of education: Not on file  . Highest education level: Not on file  Occupational History  . Not on file  Social Needs  . Financial resource strain: Not on file  . Food insecurity    Worry: Not on file    Inability: Not on file  . Transportation needs    Medical: Not on file    Non-medical: Not on file  Tobacco Use  . Smoking status: Never Smoker  . Smokeless tobacco: Never Used   Substance and Sexual Activity  . Alcohol use: No    Alcohol/week: 0.0 standard drinks  . Drug use: No  . Sexual activity: Not on file  Lifestyle  . Physical activity    Days per week: Not on file    Minutes per session: Not on file  . Stress: Not on file  Relationships  . Social Herbalist on phone: Not on file    Gets together: Not on file    Attends religious service: Not on file    Active member of club or organization: Not on file    Attends meetings of clubs or organizations: Not on file    Relationship status: Not on file  Other Topics Concern  . Not on file  Social History Narrative  . Not on file   Past Surgical History:  Procedure Laterality Date  . BACK SURGERY     lower back surgery x 2   Past Medical History:  Diagnosis Date  . Arthritis    "some in my back"  . GERD (gastroesophageal reflux disease)   . Headache(784.0)    There were no vitals taken for this visit.  Opioid Risk Score:   Fall Risk Score:  `1  Depression screen PHQ 2/9  Depression screen [facility] 2/9 06/14/2018 04/12/2018 04/05/2018 02/17/2018 02/15/2018 12/14/2017 03/26/2017  Decreased Interest 0 0 0 0 1 1 1  Down, Depressed, Hopeless 0 0 0 0 1 1 1   PHQ - 2 Score 0 0 0 0 2 2 2   Altered sleeping - - - - - - -  Tired, decreased energy - - - - - - -  Change in appetite - - - - - - -  Feeling bad or failure about yourself  - - - - - - -  Trouble concentrating - - - - - - -  Moving slowly or fidgety/restless - - - - - - -  Suicidal thoughts - - - - - - -  PHQ-9 Score - - - - - - -  Difficult doing work/chores - - - - - - -      Review of Systems  All other systems reviewed and are negative.      Objective:   Physical Exam Vitals signs and nursing note reviewed.  Constitutional:      Appearance: Normal appearance.  Neck:     Musculoskeletal: Normal range of motion and neck supple.  Cardiovascular:     Rate and Rhythm: Normal rate and regular rhythm.     Pulses: Normal  pulses.     Heart sounds: Normal heart sounds.  Pulmonary:     Effort: Pulmonary effort is normal.     Breath sounds: Normal breath sounds.  Musculoskeletal:     Comments: Normal Muscle Bulk and Muscle Testing Reveals:  Upper Extremities: Full ROM and Muscle Strength 5/5 Thoracic Paraspinal Tenderness: T-7-T-9 Lumbar Hypersensitivity Lower Extremities: Decreased ROM and Muscle Strength 4/5 Arises from Table Slowly using cane for support Antalgic  Gait   Skin:    General: Skin is warm and dry.  Neurological:     General: No focal deficit present.     Mental Status: He is alert and oriented to person, place, and time.  Psychiatric:        Mood and Affect: Mood normal.        Behavior: Behavior normal.           Assessment & Plan:  1. 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 He has lumbar degenerative disc disease. Continuecurrent medication regimen withGabapentin, Continue current Medication regimen. S/P Left Lumbar Facet Radiofrequency Ablation with sedation, on 11/12/2019by Dr. Dossie Arbour with good results noted.12/08/2018 Refilled: MS Contin30 mg one tablet every 12 hours #60 and Oxycodone 10mg  one tablet every 6 hours as needed for pain #120. We will continue the opioid monitoring program, this consists of regular clinic visits, examinations, urine drug screen, pill counts as well as use of [place] Controlled Substance reporting System. 2. Sacroiliac Dysfunction: Continue with current medication, heat and exercise regime.12/08/2018 3. Muscle Spasm:Continue Tizanidine as needed.Continueto Monitor.12/08/2018  15 minutes of face to face patient care time was spent during this visit. All questions were encouraged and answered.  F/U in 1 Month

## 2019-01-16 ENCOUNTER — Encounter: Payer: Self-pay | Admitting: Registered Nurse

## 2019-01-16 ENCOUNTER — Other Ambulatory Visit: Payer: Self-pay

## 2019-01-16 ENCOUNTER — Encounter: Payer: PPO | Attending: Physical Medicine & Rehabilitation | Admitting: Registered Nurse

## 2019-01-16 VITALS — BP 144/78 | HR 71 | Temp 98.7°F | Resp 20 | Ht 73.0 in | Wt 306.0 lb

## 2019-01-16 DIAGNOSIS — M961 Postlaminectomy syndrome, not elsewhere classified: Secondary | ICD-10-CM | POA: Diagnosis not present

## 2019-01-16 DIAGNOSIS — G8928 Other chronic postprocedural pain: Secondary | ICD-10-CM | POA: Diagnosis not present

## 2019-01-16 DIAGNOSIS — M79605 Pain in left leg: Secondary | ICD-10-CM | POA: Diagnosis not present

## 2019-01-16 DIAGNOSIS — M545 Low back pain: Secondary | ICD-10-CM | POA: Insufficient documentation

## 2019-01-16 DIAGNOSIS — Z981 Arthrodesis status: Secondary | ICD-10-CM | POA: Insufficient documentation

## 2019-01-16 DIAGNOSIS — M79604 Pain in right leg: Secondary | ICD-10-CM | POA: Insufficient documentation

## 2019-01-16 DIAGNOSIS — M5416 Radiculopathy, lumbar region: Secondary | ICD-10-CM

## 2019-01-16 DIAGNOSIS — Z5181 Encounter for therapeutic drug level monitoring: Secondary | ICD-10-CM

## 2019-01-16 DIAGNOSIS — Z79899 Other long term (current) drug therapy: Secondary | ICD-10-CM | POA: Diagnosis not present

## 2019-01-16 DIAGNOSIS — G894 Chronic pain syndrome: Secondary | ICD-10-CM | POA: Diagnosis not present

## 2019-01-16 DIAGNOSIS — M5136 Other intervertebral disc degeneration, lumbar region: Secondary | ICD-10-CM

## 2019-01-16 DIAGNOSIS — M62830 Muscle spasm of back: Secondary | ICD-10-CM | POA: Diagnosis not present

## 2019-01-16 MED ORDER — OXYCODONE HCL 10 MG PO TABS: 10.0000 mg | ORAL_TABLET | Freq: Four times a day (QID) | ORAL | 0 refills | Status: DC | PRN

## 2019-01-16 MED ORDER — MORPHINE SULFATE ER 30 MG PO TBCR: 30.0000 mg | EXTENDED_RELEASE_TABLET | Freq: Two times a day (BID) | ORAL | 0 refills | Status: DC

## 2019-01-16 NOTE — Progress Notes (Signed)
Subjective:    Patient ID: Samuel [name], male    DOB: [birth year], 43 y.o.   MRN: [id number]  HPI: Samuel [name] is a 43 y.o. male who returns for follow up appointment for chronic pain and medication refill. He states his pain is located in his lower back radiating into his bilateral lower extremities. He rates his  Pain 7. His current exercise regime is walking.  Samuel [name] Morphine equivalent is 120.00  MME.  UDS ordered today.   Pain Inventory Average Pain 6 Pain Right Now 7 My pain is constant, sharp, burning, dull, stabbing and aching  In the last 24 hours, has pain interfered with the following? General activity 9 Relation with others 9 Enjoyment of life 9 What TIME of day is your pain at its worst? morning, evening, and night Sleep (in general) Fair  Pain is worse with: walking, bending, sitting, inactivity and standing Pain improves with: rest and medication Relief from Meds: 5  Mobility walk with assistance use a cane how many minutes can you walk? 5 ability to climb steps?  yes do you drive?  yes  Function disabled: date disabled 2010  Neuro/Psych weakness trouble walking spasms depression  Prior Studies Any changes since last visit?  no  Physicians involved in your care Any changes since last visit?  no   Family History  Problem Relation Age of Onset  . Hypertension Father    Social History   Socioeconomic History  . Marital status: Married    Spouse name: Not on file  . Number of children: Not on file  . Years of education: Not on file  . Highest education level: Not on file  Occupational History  . Not on file  Social Needs  . Financial resource strain: Not on file  . Food insecurity    Worry: Not on file    Inability: Not on file  . Transportation needs    Medical: Not on file    Non-medical: Not on file  Tobacco Use  . Smoking status: Never Smoker  . Smokeless tobacco: Never Used  Substance and Sexual Activity  .  Alcohol use: No    Alcohol/week: 0.0 standard drinks  . Drug use: No  . Sexual activity: Not on file  Lifestyle  . Physical activity    Days per week: Not on file    Minutes per session: Not on file  . Stress: Not on file  Relationships  . Social Herbalist on phone: Not on file    Gets together: Not on file    Attends religious service: Not on file    Active member of club or organization: Not on file    Attends meetings of clubs or organizations: Not on file    Relationship status: Not on file  Other Topics Concern  . Not on file  Social History Narrative  . Not on file   Past Surgical History:  Procedure Laterality Date  . BACK SURGERY     lower back surgery x 2   Past Medical History:  Diagnosis Date  . Arthritis    "some in my back"  . GERD (gastroesophageal reflux disease)   . Headache(784.0)    There were no vitals taken for this visit.  Opioid Risk Score:   Fall Risk Score:  `1  Depression screen [facility] 2/9  Depression screen [facility] 2/9 06/14/2018 04/12/2018 04/05/2018 02/17/2018 02/15/2018 12/14/2017 03/26/2017  Decreased Interest 0 0 0 0 1 1  1  Down, Depressed, Hopeless 0 0 0 0 1 1 1   PHQ - 2 Score 0 0 0 0 2 2 2   Altered sleeping - - - - - - -  Tired, decreased energy - - - - - - -  Change in appetite - - - - - - -  Feeling bad or failure about yourself  - - - - - - -  Trouble concentrating - - - - - - -  Moving slowly or fidgety/restless - - - - - - -  Suicidal thoughts - - - - - - -  PHQ-9 Score - - - - - - -  Difficult doing work/chores - - - - - - -     Review of Systems  Constitutional: Negative.   HENT: Negative.   Eyes: Negative.   Respiratory: Negative.   Cardiovascular: Negative.   Gastrointestinal: Negative.   Endocrine: Negative.   Genitourinary: Negative.   Musculoskeletal: Positive for back pain and gait problem.  Skin: Negative.   Allergic/Immunologic: Negative.   Neurological: Positive for weakness.  Hematological:  Negative.   Psychiatric/Behavioral: Positive for dysphoric mood.  All other systems reviewed and are negative.      Objective:   Physical Exam Vitals signs and nursing note reviewed.  Constitutional:      Appearance: Normal appearance.  Neck:     Musculoskeletal: Normal range of motion and neck supple.  Cardiovascular:     Rate and Rhythm: Normal rate and regular rhythm.     Pulses: Normal pulses.     Heart sounds: Normal heart sounds.  Pulmonary:     Effort: Pulmonary effort is normal.     Breath sounds: Normal breath sounds.  Musculoskeletal:     Comments: Normal Muscle Bulk and Muscle Testing Reveals:  Upper Extremities: Full ROM and Muscle Strength 5/5 Thoracic Paraspinal Tenderness: T-7-T-9  Lumbar Hypersensitivity Lower Extremities: Decreased ROM and Muscle Strength 5/5 Bilateral Lower Extremities Flexion Produces Pain into Lumbar Arises from Table slowly using cane for support Antalgic Gait   Skin:    General: Skin is warm and dry.  Neurological:     Mental Status: He is alert and oriented to person, place, and time.  Psychiatric:        Mood and Affect: Mood normal.        Behavior: Behavior normal.           Assessment & Plan:  1. 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 He has lumbar degenerative disc disease. Continuecurrent medication regimen withGabapentin, Continue current Medication regimen. S/P Left Lumbar Facet Radiofrequency Ablation with sedation, on 11/12/2019by Dr. Dossie Arbour with good results noted.01/16/2019 Refilled: MS Contin30 mg one tablet every 12 hours #60 and Oxycodone 10mg  one tablet every 6 hours as needed for pain #120. We will continue the opioid monitoring program, this consists of regular clinic visits, examinations, urine drug screen, pill counts as well as use of [place] Controlled Substance reporting System. 2. Sacroiliac Dysfunction: Continue with current medication, heat and  exercise regime.01/16/2019 3. Muscle Spasm:Continue Tizanidine as needed.Continueto Monitor.01/16/2019  15 minutes of face to face patient care time was spent during this visit. All questions were encouraged and answered.  F/U in 1 Month

## 2019-01-18 ENCOUNTER — Telehealth: Payer: Self-pay | Admitting: *Deleted

## 2019-01-18 LAB — TOXASSURE SELECT,+ANTIDEPR,UR

## 2019-01-18 NOTE — Telephone Encounter (Signed)
Urine drug screen for this encounter is consistent for prescribed medication 

## 2019-02-14 ENCOUNTER — Encounter: Payer: PPO | Attending: Physical Medicine & Rehabilitation | Admitting: Registered Nurse

## 2019-02-14 ENCOUNTER — Other Ambulatory Visit: Payer: Self-pay

## 2019-02-14 ENCOUNTER — Encounter: Payer: Self-pay | Admitting: Registered Nurse

## 2019-02-14 VITALS — BP 137/72 | HR 69 | Temp 97.7°F | Ht 73.0 in | Wt 310.0 lb

## 2019-02-14 DIAGNOSIS — M5136 Other intervertebral disc degeneration, lumbar region: Secondary | ICD-10-CM

## 2019-02-14 DIAGNOSIS — M79605 Pain in left leg: Secondary | ICD-10-CM | POA: Insufficient documentation

## 2019-02-14 DIAGNOSIS — Z981 Arthrodesis status: Secondary | ICD-10-CM | POA: Insufficient documentation

## 2019-02-14 DIAGNOSIS — Z5181 Encounter for therapeutic drug level monitoring: Secondary | ICD-10-CM | POA: Diagnosis not present

## 2019-02-14 DIAGNOSIS — Z79899 Other long term (current) drug therapy: Secondary | ICD-10-CM

## 2019-02-14 DIAGNOSIS — M62830 Muscle spasm of back: Secondary | ICD-10-CM | POA: Diagnosis not present

## 2019-02-14 DIAGNOSIS — G8928 Other chronic postprocedural pain: Secondary | ICD-10-CM | POA: Diagnosis not present

## 2019-02-14 DIAGNOSIS — G894 Chronic pain syndrome: Secondary | ICD-10-CM

## 2019-02-14 DIAGNOSIS — M5416 Radiculopathy, lumbar region: Secondary | ICD-10-CM | POA: Diagnosis not present

## 2019-02-14 DIAGNOSIS — M79604 Pain in right leg: Secondary | ICD-10-CM | POA: Diagnosis not present

## 2019-02-14 DIAGNOSIS — M545 Low back pain: Secondary | ICD-10-CM | POA: Insufficient documentation

## 2019-02-14 DIAGNOSIS — M961 Postlaminectomy syndrome, not elsewhere classified: Secondary | ICD-10-CM | POA: Diagnosis not present

## 2019-02-14 MED ORDER — TIZANIDINE HCL 2 MG PO TABS: 2.0000 mg | ORAL_TABLET | Freq: Three times a day (TID) | ORAL | 3 refills | Status: DC | PRN

## 2019-02-14 MED ORDER — OXYCODONE HCL 10 MG PO TABS: 10.0000 mg | ORAL_TABLET | Freq: Four times a day (QID) | ORAL | 0 refills | Status: DC | PRN

## 2019-02-14 MED ORDER — MORPHINE SULFATE ER 30 MG PO TBCR: 30.0000 mg | EXTENDED_RELEASE_TABLET | Freq: Two times a day (BID) | ORAL | 0 refills | Status: DC

## 2019-02-14 NOTE — Progress Notes (Signed)
Subjective:    Patient ID: Samuel [name], male    DOB: 1976/01/13, 43 y.o.   MRN: [id number]  HPI: Samuel [name] is a 43 y.o. male who returns for follow up appointment for chronic pain and medication refill. He states his pain is located in his mid- lower back radiating into his bilateral lower extremities. He rates his pain 6. His  current exercise regime is walking, he was encouraged to increase his HEP as tolerated.   Mr. Sandles Morphine equivalent is 120.00 MME. Last UDS was Performed on 01/16/2019, it was consistent.   Pain Inventory Average Pain 6 Pain Right Now 6 My pain is constant, sharp, burning, dull, stabbing and aching  In the last 24 hours, has pain interfered with the following? General activity 8 Relation with others 9 Enjoyment of life 9 What TIME of day is your pain at its worst? morning, evening, night Sleep (in general) Fair  Pain is worse with: walking, bending, sitting, inactivity and standing Pain improves with: heat/ice and medication Relief from Meds: 4  Mobility walk with assistance use a cane how many minutes can you walk? 2 ability to climb steps?  yes do you drive?  yes Do you have any goals in this area?  no  Function disabled: date disabled . I need assistance with the following:  dressing, bathing, meal prep, household duties and shopping  Neuro/Psych weakness trouble walking spasms depression  Prior Studies Any changes since last visit?  no  Physicians involved in your care Any changes since last visit?  no   Family History  Problem Relation Age of Onset  . Hypertension Father    Social History   Socioeconomic History  . Marital status: Married    Spouse name: Not on file  . Number of children: Not on file  . Years of education: Not on file  . Highest education level: Not on file  Occupational History  . Not on file  Social Needs  . Financial resource strain: Not on file  . Food insecurity    Worry: Not on  file    Inability: Not on file  . Transportation needs    Medical: Not on file    Non-medical: Not on file  Tobacco Use  . Smoking status: Never Smoker  . Smokeless tobacco: Never Used  Substance and Sexual Activity  . Alcohol use: No    Alcohol/week: 0.0 standard drinks  . Drug use: No  . Sexual activity: Not on file  Lifestyle  . Physical activity    Days per week: Not on file    Minutes per session: Not on file  . Stress: Not on file  Relationships  . Social Herbalist on phone: Not on file    Gets together: Not on file    Attends religious service: Not on file    Active member of club or organization: Not on file    Attends meetings of clubs or organizations: Not on file    Relationship status: Not on file  Other Topics Concern  . Not on file  Social History Narrative  . Not on file   Past Surgical History:  Procedure Laterality Date  . BACK SURGERY     lower back surgery x 2   Past Medical History:  Diagnosis Date  . Arthritis    "some in my back"  . GERD (gastroesophageal reflux disease)   . Headache(784.0)    BP 137/72   Pulse 69  Temp 97.7 F (36.5 C)   Ht 6\' 1"  (1.854 m)   Wt (!) 310 lb (140.6 kg)   SpO2 98%   BMI 40.90 kg/m   Opioid Risk Score:   Fall Risk Score:  `1  Depression screen PHQ 2/9  Depression screen [facility] 2/9 06/14/2018 04/12/2018 04/05/2018 02/17/2018 02/15/2018 12/14/2017 03/26/2017  Decreased Interest 0 0 0 0 1 1 1   Down, Depressed, Hopeless 0 0 0 0 1 1 1   PHQ - 2 Score 0 0 0 0 2 2 2   Altered sleeping - - - - - - -  Tired, decreased energy - - - - - - -  Change in appetite - - - - - - -  Feeling bad or failure about yourself  - - - - - - -  Trouble concentrating - - - - - - -  Moving slowly or fidgety/restless - - - - - - -  Suicidal thoughts - - - - - - -  PHQ-9 Score - - - - - - -  Difficult doing work/chores - - - - - - -    Review of Systems  Constitutional: Negative.   HENT: Negative.   Eyes: Negative.    Respiratory: Negative.   Cardiovascular: Negative.   Gastrointestinal: Negative.   Endocrine: Negative.   Genitourinary: Negative.   Musculoskeletal: Positive for back pain and gait problem.  Skin: Negative.   Psychiatric/Behavioral: Negative.   All other systems reviewed and are negative.      Objective:   Physical Exam Vitals signs and nursing note reviewed.  Constitutional:      Appearance: Normal appearance.  Neck:     Musculoskeletal: Normal range of motion and neck supple.  Cardiovascular:     Rate and Rhythm: Normal rate and regular rhythm.     Pulses: Normal pulses.     Heart sounds: Normal heart sounds.  Pulmonary:     Effort: Pulmonary effort is normal.     Breath sounds: Normal breath sounds.  Musculoskeletal:     Comments: Normal Muscle Bulk and Muscle Testing Reveals:  Upper Extremities: Full ROM and Muscle Strength 5/5 Thoracic Paraspinal Tenderness: T-7-T-9  Lumbar Paraspinal Tenderness: L-3-L-5 Lower Extremities: Decreased ROM and Muscle Strength 4/5 Bilateral Lower Extremities Flexion Produces Pain into Lower Back Arises from Table Slowly using cane for support Antalgic Gait   Skin:    General: Skin is warm and dry.  Neurological:     Mental Status: He is alert and oriented to person, place, and time.  Psychiatric:        Mood and Affect: Mood normal.        Behavior: Behavior normal.           Assessment & Plan:  1. 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 He has lumbar degenerative disc disease. Continuecurrent medication regimen withGabapentin, Continue current Medication regimen. S/P Left Lumbar Facet Radiofrequency Ablation with sedation, on 11/12/2019by Dr. Dossie Arbour with good results noted.Mr. Vaziri was instructed to call Dr. Lowella Dandy to schedule Radiofrequency, he verbalizes understanding. 02/14/2019 Refilled: MS Contin30 mg one tablet every 12 hours #60 and Oxycodone 10mg  one tablet every 6 hours as  needed for pain #120. We will continue the opioid monitoring program, this consists of regular clinic visits, examinations, urine drug screen, pill counts as well as use of [place] Controlled Substance reporting System. 2. Sacroiliac Dysfunction: Continue with current medication, heat and exercise regime.02/14/2019 3. Muscle Spasm:Scheduled for Trigger Point Injection with  [name]. Continue Tizanidineas needed.Continueto Monitor.02/14/2019  35minutes of face to face patient care time was spent during this visit. All questions were encouraged and answered.  F/U in 1 Month

## 2019-03-13 ENCOUNTER — Encounter: Payer: PPO | Attending: Physical Medicine & Rehabilitation | Admitting: Registered Nurse

## 2019-03-13 ENCOUNTER — Encounter: Payer: PPO | Admitting: Physical Medicine & Rehabilitation

## 2019-03-13 ENCOUNTER — Other Ambulatory Visit: Payer: Self-pay

## 2019-03-13 ENCOUNTER — Encounter: Payer: Self-pay | Admitting: Registered Nurse

## 2019-03-13 VITALS — BP 127/73 | HR 64 | Temp 98.1°F | Ht 73.0 in | Wt 314.8 lb

## 2019-03-13 DIAGNOSIS — M79605 Pain in left leg: Secondary | ICD-10-CM | POA: Diagnosis not present

## 2019-03-13 DIAGNOSIS — G894 Chronic pain syndrome: Secondary | ICD-10-CM

## 2019-03-13 DIAGNOSIS — Z5181 Encounter for therapeutic drug level monitoring: Secondary | ICD-10-CM

## 2019-03-13 DIAGNOSIS — Z79899 Other long term (current) drug therapy: Secondary | ICD-10-CM

## 2019-03-13 DIAGNOSIS — M5136 Other intervertebral disc degeneration, lumbar region: Secondary | ICD-10-CM | POA: Diagnosis not present

## 2019-03-13 DIAGNOSIS — M62830 Muscle spasm of back: Secondary | ICD-10-CM | POA: Diagnosis not present

## 2019-03-13 DIAGNOSIS — M545 Low back pain: Secondary | ICD-10-CM | POA: Insufficient documentation

## 2019-03-13 DIAGNOSIS — Z981 Arthrodesis status: Secondary | ICD-10-CM | POA: Insufficient documentation

## 2019-03-13 DIAGNOSIS — M79604 Pain in right leg: Secondary | ICD-10-CM | POA: Diagnosis not present

## 2019-03-13 DIAGNOSIS — M5416 Radiculopathy, lumbar region: Secondary | ICD-10-CM | POA: Diagnosis not present

## 2019-03-13 DIAGNOSIS — G8928 Other chronic postprocedural pain: Secondary | ICD-10-CM | POA: Diagnosis not present

## 2019-03-13 DIAGNOSIS — M961 Postlaminectomy syndrome, not elsewhere classified: Secondary | ICD-10-CM | POA: Insufficient documentation

## 2019-03-13 MED ORDER — GABAPENTIN 300 MG PO CAPS: ORAL_CAPSULE | ORAL | 2 refills | Status: DC

## 2019-03-13 MED ORDER — MORPHINE SULFATE ER 30 MG PO TBCR: 30.0000 mg | EXTENDED_RELEASE_TABLET | Freq: Two times a day (BID) | ORAL | 0 refills | Status: DC

## 2019-03-13 MED ORDER — OXYCODONE HCL 10 MG PO TABS: 10.0000 mg | ORAL_TABLET | Freq: Four times a day (QID) | ORAL | 0 refills | Status: DC | PRN

## 2019-03-13 NOTE — Progress Notes (Signed)
Subjective:    Patient ID: Samuel [name], male    DOB: May 19, 1976, 43 y.o.   MRN: [id number]  HPI: Samuel [name] is a 43 y.o. male who returns for follow up appointment for chronic pain and medication refill. He states his pain is located in his mid- lower back radiating into his bilateral lower extremities. Also states his lower back pain has intensified he was instructed to call Dr. Lowella Dandy to schedule Radiofrequency, he verbalizes understanding. He rates his  Pain 6. His  current exercise regime is walking short distances and performing stretching exercises.  [name] Morphine equivalent is 120.00  MME.  Last UDs was Performed on 01/16/2019, it was consistent.   Pain Inventory Average Pain 6 Pain Right Now 6 My pain is constant, sharp, burning, dull, stabbing and aching  In the last 24 hours, has pain interfered with the following? General activity 8 Relation with others 9 Enjoyment of life 9 What TIME of day is your pain at its worst? morning evening and night Sleep (in general) Fair  Pain is worse with: walking, bending, sitting, inactivity and standing Pain improves with: rest and medication Relief from Meds: 5  Mobility walk with assistance use a walker how many minutes can you walk? 5 ability to climb steps?  yes do you drive?  yes  Function disabled: date disabled 2010 I need assistance with the following:  dressing, bathing, meal prep, household duties and shopping  Neuro/Psych weakness spasms depression  Prior Studies Any changes since last visit?  no  Physicians involved in your care Any changes since last visit?  no   Family History  Problem Relation Age of Onset  . Hypertension Father    Social History   Socioeconomic History  . Marital status: Married    Spouse name: Not on file  . Number of children: Not on file  . Years of education: Not on file  . Highest education level: Not on file  Occupational History  . Not on file   Social Needs  . Financial resource strain: Not on file  . Food insecurity    Worry: Not on file    Inability: Not on file  . Transportation needs    Medical: Not on file    Non-medical: Not on file  Tobacco Use  . Smoking status: Never Smoker  . Smokeless tobacco: Never Used  Substance and Sexual Activity  . Alcohol use: No    Alcohol/week: 0.0 standard drinks  . Drug use: No  . Sexual activity: Not on file  Lifestyle  . Physical activity    Days per week: Not on file    Minutes per session: Not on file  . Stress: Not on file  Relationships  . Social Herbalist on phone: Not on file    Gets together: Not on file    Attends religious service: Not on file    Active member of club or organization: Not on file    Attends meetings of clubs or organizations: Not on file    Relationship status: Not on file  Other Topics Concern  . Not on file  Social History Narrative  . Not on file   Past Surgical History:  Procedure Laterality Date  . BACK SURGERY     lower back surgery x 2   Past Medical History:  Diagnosis Date  . Arthritis    "some in my back"  . GERD (gastroesophageal reflux disease)   . Headache(784.0)  BP 127/73   Pulse 64   Temp 98.1 F (36.7 C)   Ht 6\' 1"  (1.854 m)   Wt (!) 314 lb 12.8 oz (142.8 kg)   SpO2 98%   BMI 41.53 kg/m   Opioid Risk Score:   Fall Risk Score:  `1  Depression screen PHQ 2/9  Depression screen [facility] 2/9 03/13/2019 06/14/2018 04/12/2018 04/05/2018 02/17/2018 02/15/2018 12/14/2017  Decreased Interest 1 0 0 0 0 1 1  Down, Depressed, Hopeless 1 0 0 0 0 1 1  PHQ - 2 Score 2 0 0 0 0 2 2  Altered sleeping - - - - - - -  Tired, decreased energy - - - - - - -  Change in appetite - - - - - - -  Feeling bad or failure about yourself  - - - - - - -  Trouble concentrating - - - - - - -  Moving slowly or fidgety/restless - - - - - - -  Suicidal thoughts - - - - - - -  PHQ-9 Score - - - - - - -  Difficult doing work/chores - -  - - - - -    Review of Systems  Constitutional: Negative.   HENT: Negative.   Eyes: Negative.   Respiratory: Negative.   Cardiovascular: Negative.   Gastrointestinal: Negative.   Endocrine: Negative.   Genitourinary: Negative.   Musculoskeletal: Positive for back pain.       Spasms  Skin: Negative.   Allergic/Immunologic: Negative.   Neurological: Positive for weakness.  Hematological: Negative.   Psychiatric/Behavioral: Positive for dysphoric mood.  All other systems reviewed and are negative.      Objective:   Physical Exam Vitals signs and nursing note reviewed.  Constitutional:      Appearance: Normal appearance.  Neck:     Musculoskeletal: Normal range of motion and neck supple.  Cardiovascular:     Rate and Rhythm: Normal rate and regular rhythm.     Pulses: Normal pulses.     Heart sounds: Normal heart sounds.  Pulmonary:     Effort: Pulmonary effort is normal.     Breath sounds: Normal breath sounds.  Musculoskeletal:     Comments: Normal Muscle Bulk and Muscle Testing Reveals:  Upper Extremities: Full ROM and Muscle Strength 5/5 Thoracic Hypersensitivity: T-7-T-9  Lumbar Hypersensitivity Lower Extremities: Full ROM and Muscle Strength 5/5 Arises from Table Slowly using cane for support Antalgic Gait   Skin:    General: Skin is warm and dry.  Neurological:     Mental Status: He is alert and oriented to person, place, and time.  Psychiatric:        Mood and Affect: Mood normal.        Behavior: Behavior normal.           Assessment & Plan:  1. 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 He has lumbar degenerative disc disease. Continuecurrent medication regimen withGabapentin, Continue current Medication regimen. S/P Left Lumbar Facet Radiofrequency Ablation with sedation, on 11/12/2019by Dr. Dossie Arbour with good results noted.I spoke with [name] again and he was instructed to call Dr. Lowella Dandy to schedule  Radiofrequency, he verbalizes understanding. 03/13/2019 Refilled: MS Contin30 mg one tablet every 12 hours #60 and Oxycodone 10mg  one tablet every 6 hours as needed for pain #120. We will continue the opioid monitoring program, this consists of regular clinic visits, examinations, urine drug screen, pill counts as well as use of [place]  Controlled Substance reporting System. 2. Sacroiliac Dysfunction: Continue with current medication, heat and exercise regime.03/13/2019 3. Muscle Spasm:Scheduled for Trigger Point Injection with [name]. Continue Tizanidineas needed.Continueto Monitor.03/13/2019  64minutes of face to face patient care time was spent during this visit. All questions were encouraged and answered.  F/U in 1 Month

## 2019-04-11 ENCOUNTER — Other Ambulatory Visit: Payer: Self-pay

## 2019-04-11 ENCOUNTER — Encounter: Payer: Self-pay | Admitting: Physical Medicine & Rehabilitation

## 2019-04-11 ENCOUNTER — Encounter: Payer: PPO | Attending: Physical Medicine & Rehabilitation | Admitting: Physical Medicine & Rehabilitation

## 2019-04-11 VITALS — BP 138/83 | HR 71 | Temp 97.5°F | Ht 73.0 in | Wt 313.0 lb

## 2019-04-11 DIAGNOSIS — M79605 Pain in left leg: Secondary | ICD-10-CM | POA: Diagnosis not present

## 2019-04-11 DIAGNOSIS — M79604 Pain in right leg: Secondary | ICD-10-CM | POA: Diagnosis not present

## 2019-04-11 DIAGNOSIS — M961 Postlaminectomy syndrome, not elsewhere classified: Secondary | ICD-10-CM | POA: Insufficient documentation

## 2019-04-11 DIAGNOSIS — G8928 Other chronic postprocedural pain: Secondary | ICD-10-CM | POA: Diagnosis not present

## 2019-04-11 DIAGNOSIS — M545 Low back pain: Secondary | ICD-10-CM | POA: Insufficient documentation

## 2019-04-11 DIAGNOSIS — Z79899 Other long term (current) drug therapy: Secondary | ICD-10-CM | POA: Insufficient documentation

## 2019-04-11 DIAGNOSIS — Z981 Arthrodesis status: Secondary | ICD-10-CM | POA: Insufficient documentation

## 2019-04-11 DIAGNOSIS — Z5181 Encounter for therapeutic drug level monitoring: Secondary | ICD-10-CM | POA: Diagnosis not present

## 2019-04-11 DIAGNOSIS — M7918 Myalgia, other site: Secondary | ICD-10-CM

## 2019-04-11 MED ORDER — OXYCODONE HCL 10 MG PO TABS: 10.0000 mg | ORAL_TABLET | Freq: Four times a day (QID) | ORAL | 0 refills | Status: DC | PRN

## 2019-04-11 MED ORDER — MORPHINE SULFATE ER 30 MG PO TBCR: 30.0000 mg | EXTENDED_RELEASE_TABLET | Freq: Two times a day (BID) | ORAL | 0 refills | Status: DC

## 2019-04-11 NOTE — Patient Instructions (Signed)
Trigger Point Injection Trigger points are areas where you have pain. A trigger point injection is a shot given in the trigger point to help relieve pain for a few days to a few months. Common places for trigger points include:  The neck.  The shoulders.  The upper back.  The lower back. A trigger point injection will not cure long-term (chronic) pain permanently. These injections do not always work for every person. For some people, they can help to relieve pain for a few days to a few months. Tell a health care provider about:  Any allergies you have.  All medicines you are taking, including vitamins, herbs, eye drops, creams, and over-the-counter medicines.  Any problems you or family members have had with anesthetic medicines.  Any blood disorders you have.  Any surgeries you have had.  Any medical conditions you have. What are the risks? Generally, this is a safe procedure. However, problems may occur, including:  Infection.  Bleeding or bruising.  Allergic reaction to the injected medicine.  Irritation of the skin around the injection site. What happens before the procedure? Ask your health care provider about:  Changing or stopping your regular medicines. This is especially important if you are taking diabetes medicines or blood thinners.  Taking medicines such as aspirin and ibuprofen. These medicines can thin your blood. Do not take these medicines unless your health care provider tells you to take them.  Taking over-the-counter medicines, vitamins, herbs, and supplements. What happens during the procedure?   Your health care provider will feel for trigger points. A marker may be used to circle the area for the injection.  The skin over the trigger point will be washed with a germ-killing (antiseptic) solution.  A thin needle is used for the injection. You may feel pain or a twitching feeling when the needle enters the trigger point.  A numbing solution may  be injected into the trigger point. Sometimes a medicine to keep down inflammation is also injected.  Your health care provider may move the needle around the area where the trigger point is located until the tightness and twitching goes away.  After the injection, your health care provider may put gentle pressure over the injection site.  The injection site will be covered with a bandage (dressing). The procedure may vary among health care providers and hospitals. What can I expect after treatment? After treatment, you may have:  Soreness and stiffness for 1-2 days.  A dressing. This can be taken off in a few hours or as told by your health care provider. Follow these instructions at home: Injection site care  Remove your dressing as told by your health care provider.  Check your injection site every day for signs of infection. Check for: ? Redness, swelling, or pain. ? Fluid or blood. ? Warmth. ? Pus or a bad smell. Managing pain, stiffness, and swelling  If directed, put ice on the affected area. ? Put ice in a plastic bag. ? Place a towel between your skin and the bag. ? Leave the ice on for 20 minutes, 2-3 times a day. General instructions  If you were asked to stop your regular medicines, ask your health care provider when you may start taking them again.  Return to your normal activities as told by your health care provider. Ask your health care provider what activities are safe for you.  Do not take baths, swim, or use a hot tub until your health care provider approves.    You may be asked to see an occupational or physical therapist for exercises that reduce muscle strain and stretch the area of the trigger point.  Keep all follow-up visits as told by your health care provider. This is important. Contact a health care provider if:  Your pain comes back, and it is worse than before the injection. You may need more injections.  You have chills or a fever.  The  injection site becomes more painful, red, swollen, or warm to the touch. Summary  A trigger point injection is a shot given in the trigger point to help relieve pain for a few days to a few months.  Common places for trigger point injections are the neck, shoulder, upper back, and lower back.  These injections do not always work for every person, but for some people, the injections can help to relieve pain for a few days to a few months.  Contact a health care provider if symptoms come back or they are worse than before treatment. Also, get help if the injection site becomes more painful, red, swollen, or warm to the touch. This information is not intended to replace advice given to you by your health care provider. Make sure you discuss any questions you have with your health care provider. Document Released: 04/30/2011 Document Revised: 06/22/2018 Document Reviewed: 06/22/2018 Elsevier Patient Education  2020 [employer]  

## 2019-04-11 NOTE — Progress Notes (Signed)
Trigger Point Injection  Indication: Right periscapular  Myofascial pain not relieved by medication management and other conservative care.  Informed consent was obtained after describing risk and benefits of the procedure with the patient, this includes bleeding, bruising, infection and medication side effects.  The patient wishes to proceed and has given written consent.  The patient was placed in a seated position.  The RIGHT levator scapula, supraspinatus, infraspinatus, medial scapular border x 3, T7 and T9 paraspinal  area was marked and prepped with Betadine.  It was entered with a 25-gauge 1-1/2 inch needle and 1 mL of 1% lidocaine was injected into each of 8 trigger points, after negative draw back for blood.  The patient tolerated the procedure well.  Post procedure instructions were given.

## 2019-04-16 LAB — TOXASSURE SELECT,+ANTIDEPR,UR

## 2019-04-24 ENCOUNTER — Telehealth: Payer: Self-pay | Admitting: *Deleted

## 2019-04-24 NOTE — Telephone Encounter (Signed)
Urine drug screen for this encounter is consistent for prescribed medication 

## 2019-05-08 ENCOUNTER — Encounter: Payer: Self-pay | Admitting: Registered Nurse

## 2019-05-08 ENCOUNTER — Encounter: Payer: PPO | Attending: Physical Medicine & Rehabilitation | Admitting: Registered Nurse

## 2019-05-08 ENCOUNTER — Other Ambulatory Visit: Payer: Self-pay

## 2019-05-08 VITALS — BP 124/78 | HR 66 | Temp 97.5°F | Ht 73.0 in | Wt 308.0 lb

## 2019-05-08 DIAGNOSIS — M546 Pain in thoracic spine: Secondary | ICD-10-CM | POA: Diagnosis not present

## 2019-05-08 DIAGNOSIS — M545 Low back pain: Secondary | ICD-10-CM | POA: Insufficient documentation

## 2019-05-08 DIAGNOSIS — M5136 Other intervertebral disc degeneration, lumbar region: Secondary | ICD-10-CM

## 2019-05-08 DIAGNOSIS — G8928 Other chronic postprocedural pain: Secondary | ICD-10-CM | POA: Diagnosis not present

## 2019-05-08 DIAGNOSIS — Z981 Arthrodesis status: Secondary | ICD-10-CM | POA: Diagnosis not present

## 2019-05-08 DIAGNOSIS — Z5181 Encounter for therapeutic drug level monitoring: Secondary | ICD-10-CM

## 2019-05-08 DIAGNOSIS — M7918 Myalgia, other site: Secondary | ICD-10-CM | POA: Diagnosis not present

## 2019-05-08 DIAGNOSIS — G894 Chronic pain syndrome: Secondary | ICD-10-CM

## 2019-05-08 DIAGNOSIS — M961 Postlaminectomy syndrome, not elsewhere classified: Secondary | ICD-10-CM | POA: Diagnosis not present

## 2019-05-08 DIAGNOSIS — M79605 Pain in left leg: Secondary | ICD-10-CM | POA: Insufficient documentation

## 2019-05-08 DIAGNOSIS — Z79899 Other long term (current) drug therapy: Secondary | ICD-10-CM

## 2019-05-08 DIAGNOSIS — M79604 Pain in right leg: Secondary | ICD-10-CM | POA: Diagnosis not present

## 2019-05-08 DIAGNOSIS — M5416 Radiculopathy, lumbar region: Secondary | ICD-10-CM | POA: Diagnosis not present

## 2019-05-08 MED ORDER — OXYCODONE HCL 10 MG PO TABS: 10.0000 mg | ORAL_TABLET | Freq: Four times a day (QID) | ORAL | 0 refills | Status: DC | PRN

## 2019-05-08 MED ORDER — MORPHINE SULFATE ER 30 MG PO TBCR: 30.0000 mg | EXTENDED_RELEASE_TABLET | Freq: Two times a day (BID) | ORAL | 0 refills | Status: DC

## 2019-05-08 MED ORDER — TIZANIDINE HCL 2 MG PO TABS: 2.0000 mg | ORAL_TABLET | Freq: Three times a day (TID) | ORAL | 3 refills | Status: DC | PRN

## 2019-05-08 NOTE — Progress Notes (Signed)
Subjective:    Patient ID: Samuel [name], male    DOB: Dec 06, 1975, 43 y.o.   MRN: [id number]  HPI: Samuel [name] is a 43 y.o. male who returns for follow up appointment for chronic pain and medication refill. He states his pain is located in his mid- lower back radiating into his bilateral lower extremities. Also reports increase intensity of mid- back pain over the last 6 weeks, s/p a fall he reports. We will order X-ray, he verbalizes understanding. He rates his pain 7. His current exercise regime is walking and performing stretching exercises.  Samuel [name] equivalent is 120.00  MME.  Last UDS was Performed on 04/11/2019, it was consistent.   Pain Inventory Average Pain 6 Pain Right Now 7 My pain is constant, sharp, burning, dull, stabbing and aching  In the last 24 hours, has pain interfered with the following? General activity 9 Relation with others 9 Enjoyment of life 9 What TIME of day is your pain at its worst? all Sleep (in general) Fair  Pain is worse with: walking, bending, sitting, inactivity and standing Pain improves with: rest and medication Relief from Meds: 4  Mobility use a cane how many minutes can you walk? 2 ability to climb steps?  yes do you drive?  yes  Function disabled: date disabled 2010 I need assistance with the following:  dressing, bathing, meal prep, household duties and shopping  Neuro/Psych weakness trouble walking spasms depression  Prior Studies Any changes since last visit?  no  Physicians involved in your care Any changes since last visit?  no   Family History  Problem Relation Age of Onset  . Hypertension Father    Social History   Socioeconomic History  . Marital status: Married    Spouse name: Not on file  . Number of children: Not on file  . Years of education: Not on file  . Highest education level: Not on file  Occupational History  . Not on file  Tobacco Use  . Smoking status: Never Smoker  .  Smokeless tobacco: Never Used  Substance and Sexual Activity  . Alcohol use: No    Alcohol/week: 0.0 standard drinks  . Drug use: No  . Sexual activity: Not on file  Other Topics Concern  . Not on file  Social History Narrative  . Not on file   Social Determinants of Health   Financial Resource Strain:   . Difficulty of Paying Living Expenses: Not on file  Food Insecurity:   . Worried About Charity fundraiser in the Last Year: Not on file  . Ran Out of Food in the Last Year: Not on file  Transportation Needs:   . Lack of Transportation (Medical): Not on file  . Lack of Transportation (Non-Medical): Not on file  Physical Activity:   . Days of Exercise per Week: Not on file  . Minutes of Exercise per Session: Not on file  Stress:   . Feeling of Stress : Not on file  Social Connections:   . Frequency of Communication with Friends and Family: Not on file  . Frequency of Social Gatherings with Friends and Family: Not on file  . Attends Religious Services: Not on file  . Active Member of Clubs or Organizations: Not on file  . Attends Archivist Meetings: Not on file  . Marital Status: Not on file   Past Surgical History:  Procedure Laterality Date  . BACK SURGERY     lower  back surgery x 2   Past Medical History:  Diagnosis Date  . Arthritis    "some in my back"  . GERD (gastroesophageal reflux disease)   . Headache(784.0)    BP 124/78   Pulse 66   Temp (!) 97.5 F (36.4 C)   Ht 6\' 1"  (1.854 m)   Wt (!) 308 lb (139.7 kg)   SpO2 97%   BMI 40.64 kg/m   Opioid Risk Score:   Fall Risk Score:  `1  Depression screen PHQ 2/9  Depression screen [facility] 2/9 03/13/2019 06/14/2018 04/12/2018 04/05/2018 02/17/2018 02/15/2018 12/14/2017  Decreased Interest 1 0 0 0 0 1 1  Down, Depressed, Hopeless 1 0 0 0 0 1 1  PHQ - 2 Score 2 0 0 0 0 2 2  Altered sleeping - - - - - - -  Tired, decreased energy - - - - - - -  Change in appetite - - - - - - -  Feeling bad or failure  about yourself  - - - - - - -  Trouble concentrating - - - - - - -  Moving slowly or fidgety/restless - - - - - - -  Suicidal thoughts - - - - - - -  PHQ-9 Score - - - - - - -  Difficult doing work/chores - - - - - - -    Review of Systems  Musculoskeletal: Positive for gait problem.  Neurological: Positive for weakness.  Psychiatric/Behavioral: Positive for dysphoric mood.  All other systems reviewed and are negative.      Objective:   Physical Exam Vitals and nursing note reviewed.  Constitutional:      Appearance: Normal appearance.  Cardiovascular:     Rate and Rhythm: Normal rate and regular rhythm.     Pulses: Normal pulses.     Heart sounds: Normal heart sounds.  Pulmonary:     Effort: Pulmonary effort is normal.     Breath sounds: Normal breath sounds.  Musculoskeletal:     Cervical back: Normal range of motion and neck supple.     Comments: Normal Muscle Bulk and Muscle Testing Reveals:  Upper Extremities: Full ROM and Muscle Strength 5/5 Thoracic Hypersensitivity Lumbar Paraspinal Tenderness: L-3-L-5 Lower Extremities: Decreased ROM and Muscle Strength 5/5 Bilateral Lower Extremities Flexion Produces Pain into his Lumbar Arises from Table Slowly using cane for support Antalgic  Gait   Skin:    General: Skin is warm and dry.  Neurological:     Mental Status: He is alert and oriented to person, place, and time.  Psychiatric:        Mood and Affect: Mood normal.        Behavior: Behavior normal.           Assessment & Plan:  1. 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 He has lumbar degenerative disc disease. Continuecurrent medication regimen withGabapentin, Continue current Medication regimen. S/P Left Lumbar Facet Radiofrequency Ablation with sedation, on 11/12/2019by Dr. Dossie Arbour with good results noted.I spoke with Samuel [name] again and he was instructed to call Dr. Lowella Dandy to schedule Radiofrequency, he verbalizes  understanding.04/08/2019 Refilled: MS Contin30 mg one tablet every 12 hours #60 and Oxycodone 10mg  one tablet every 6 hours as needed for pain #120. We will continue the opioid monitoring program, this consists of regular clinic visits, examinations, urine drug screen, pill counts as well as use of [place] Controlled Substance reporting System. 2. Sacroiliac Dysfunction: Continue with current  medication, heat and exercise regime.04/08/2019 3. Muscle Spasm:S/P Trigger Point Injection with [name].Continue Tizanidineas needed.Continueto Monitor.04/08/2019 4. Acute Thoracic Back Pain: RX: X-ray. Continue to Monitor.   23minutes of face to face patient care time was spent during this visit. All questions were encouraged and answered.  F/U in 1 Month

## 2019-05-09 ENCOUNTER — Ambulatory Visit
Admission: RE | Admit: 2019-05-09 | Discharge: 2019-05-09 | Disposition: A | Discharge status: Home / Self Care | Payer: PPO | Source: Ambulatory Visit | Attending: Registered Nurse | Admitting: Registered Nurse

## 2019-05-09 DIAGNOSIS — M546 Pain in thoracic spine: Secondary | ICD-10-CM | POA: Diagnosis not present

## 2019-05-12 ENCOUNTER — Telehealth: Payer: Self-pay | Admitting: Registered Nurse

## 2019-05-12 NOTE — Telephone Encounter (Signed)
Placed a call to Samuel [name] regarding X-ray results: He verbalizes understanding. We will continue to monitor.

## 2019-06-13 ENCOUNTER — Encounter: Payer: PPO | Attending: Physical Medicine & Rehabilitation | Admitting: Registered Nurse

## 2019-06-13 ENCOUNTER — Other Ambulatory Visit: Payer: Self-pay

## 2019-06-13 ENCOUNTER — Encounter: Payer: Self-pay | Admitting: Registered Nurse

## 2019-06-13 VITALS — BP 113/71 | HR 60 | Temp 97.7°F | Ht 73.0 in | Wt 315.0 lb

## 2019-06-13 DIAGNOSIS — G894 Chronic pain syndrome: Secondary | ICD-10-CM

## 2019-06-13 DIAGNOSIS — M545 Low back pain: Secondary | ICD-10-CM | POA: Insufficient documentation

## 2019-06-13 DIAGNOSIS — M79605 Pain in left leg: Secondary | ICD-10-CM | POA: Diagnosis not present

## 2019-06-13 DIAGNOSIS — M5136 Other intervertebral disc degeneration, lumbar region: Secondary | ICD-10-CM | POA: Diagnosis not present

## 2019-06-13 DIAGNOSIS — Z981 Arthrodesis status: Secondary | ICD-10-CM | POA: Insufficient documentation

## 2019-06-13 DIAGNOSIS — Z79899 Other long term (current) drug therapy: Secondary | ICD-10-CM | POA: Insufficient documentation

## 2019-06-13 DIAGNOSIS — Z5181 Encounter for therapeutic drug level monitoring: Secondary | ICD-10-CM

## 2019-06-13 DIAGNOSIS — M546 Pain in thoracic spine: Secondary | ICD-10-CM | POA: Diagnosis not present

## 2019-06-13 DIAGNOSIS — Z79891 Long term (current) use of opiate analgesic: Secondary | ICD-10-CM | POA: Diagnosis not present

## 2019-06-13 DIAGNOSIS — M5416 Radiculopathy, lumbar region: Secondary | ICD-10-CM

## 2019-06-13 DIAGNOSIS — M961 Postlaminectomy syndrome, not elsewhere classified: Secondary | ICD-10-CM | POA: Diagnosis not present

## 2019-06-13 DIAGNOSIS — M62830 Muscle spasm of back: Secondary | ICD-10-CM

## 2019-06-13 DIAGNOSIS — G8928 Other chronic postprocedural pain: Secondary | ICD-10-CM | POA: Insufficient documentation

## 2019-06-13 DIAGNOSIS — M79604 Pain in right leg: Secondary | ICD-10-CM | POA: Diagnosis not present

## 2019-06-13 MED ORDER — IBUPROFEN 800 MG PO TABS: 800.0000 mg | ORAL_TABLET | Freq: Three times a day (TID) | ORAL | 2 refills | Status: DC | PRN

## 2019-06-13 MED ORDER — OXYCODONE HCL 10 MG PO TABS: 10.0000 mg | ORAL_TABLET | Freq: Four times a day (QID) | ORAL | 0 refills | Status: DC | PRN

## 2019-06-13 MED ORDER — MORPHINE SULFATE ER 30 MG PO TBCR: 30.0000 mg | EXTENDED_RELEASE_TABLET | Freq: Two times a day (BID) | ORAL | 0 refills | Status: DC

## 2019-06-13 NOTE — Progress Notes (Signed)
Subjective:    Patient ID: Samuel [name], male    DOB: 02-Aug-1975, 44 y.o.   MRN: [id number]  HPI: Samuel [name] is a 44 y.o. male who returns for follow up appointment for chronic pain and medication refill. He states his pain is located in his lower back radiating into his bilateral lower extremities. He rates his pain 6. His current exercise regime is walking and performing stretching exercises.  [name] Morphine equivalent is 120.00  MME.  UDS ordered today.   Pain Inventory Average Pain 6 Pain Right Now 6 My pain is sharp, dull, stabbing and aching  In the last 24 hours, has pain interfered with the following? General activity 8 Relation with others 9 Enjoyment of life 9 What TIME of day is your pain at its worst? morning evening night Sleep (in general) Fair  Pain is worse with: walking, bending, sitting, inactivity and standing Pain improves with: rest and medication Relief from Meds: 5  Mobility use a cane how many minutes can you walk? 2 ability to climb steps?  yes do you drive?  yes  Function disabled: date disabled 2010 I need assistance with the following:  dressing, bathing, meal prep, household duties and shopping  Neuro/Psych weakness spasms depression  Prior Studies Any changes since last visit?  no  Physicians involved in your care Any changes since last visit?  no   Family History  Problem Relation Age of Onset  . Hypertension Father    Social History   Socioeconomic History  . Marital status: Married    Spouse name: Not on file  . Number of children: Not on file  . Years of education: Not on file  . Highest education level: Not on file  Occupational History  . Not on file  Tobacco Use  . Smoking status: Never Smoker  . Smokeless tobacco: Never Used  Substance and Sexual Activity  . Alcohol use: No    Alcohol/week: 0.0 standard drinks  . Drug use: No  . Sexual activity: Not on file  Other Topics Concern  . Not on  file  Social History Narrative  . Not on file   Social Determinants of Health   Financial Resource Strain:   . Difficulty of Paying Living Expenses: Not on file  Food Insecurity:   . Worried About Charity fundraiser in the Last Year: Not on file  . Ran Out of Food in the Last Year: Not on file  Transportation Needs:   . Lack of Transportation (Medical): Not on file  . Lack of Transportation (Non-Medical): Not on file  Physical Activity:   . Days of Exercise per Week: Not on file  . Minutes of Exercise per Session: Not on file  Stress:   . Feeling of Stress : Not on file  Social Connections:   . Frequency of Communication with Friends and Family: Not on file  . Frequency of Social Gatherings with Friends and Family: Not on file  . Attends Religious Services: Not on file  . Active Member of Clubs or Organizations: Not on file  . Attends Archivist Meetings: Not on file  . Marital Status: Not on file   Past Surgical History:  Procedure Laterality Date  . BACK SURGERY     lower back surgery x 2   Past Medical History:  Diagnosis Date  . Arthritis    "some in my back"  . GERD (gastroesophageal reflux disease)   . Headache(784.0)  BP 113/71   Pulse (!) 54   Temp 97.7 F (36.5 C)   Ht 6\' 1"  (1.854 m)   Wt (!) 315 lb (142.9 kg)   SpO2 98%   BMI 41.56 kg/m   Opioid Risk Score:   Fall Risk Score:  `1  Depression screen PHQ 2/9  Depression screen [facility] 2/9 06/13/2019 03/13/2019 06/14/2018 04/12/2018 04/05/2018 02/17/2018 02/15/2018  Decreased Interest 1 1 0 0 0 0 1  Down, Depressed, Hopeless 1 1 0 0 0 0 1  PHQ - 2 Score 2 2 0 0 0 0 2  Altered sleeping - - - - - - -  Tired, decreased energy - - - - - - -  Change in appetite - - - - - - -  Feeling bad or failure about yourself  - - - - - - -  Trouble concentrating - - - - - - -  Moving slowly or fidgety/restless - - - - - - -  Suicidal thoughts - - - - - - -  PHQ-9 Score - - - - - - -  Difficult doing  work/chores - - - - - - -   Review of Systems  Neurological: Positive for weakness.  Psychiatric/Behavioral: Positive for dysphoric mood.  All other systems reviewed and are negative.      Objective:   Physical Exam Vitals and nursing note reviewed.  Constitutional:      Appearance: Normal appearance. He is obese.  Cardiovascular:     Rate and Rhythm: Normal rate and regular rhythm.     Pulses: Normal pulses.     Heart sounds: Normal heart sounds.  Pulmonary:     Effort: Pulmonary effort is normal.     Breath sounds: Normal breath sounds.  Musculoskeletal:     Cervical back: Normal range of motion and neck supple.     Comments: Normal Muscle Bulk and Muscle Testing Reveals:  Upper Extremities: Full ROM and Muscle Strength 5/5 Lumbar Hypersensitivity Lower Extremities: Decreased ROM and Muscle Strength 5/5 Bilateral Lower Extremities Flexion Produces Pain into his Lumbar Arises from Table Slowly using cane for support Antalgic Gait   Skin:    General: Skin is warm and dry.  Neurological:     Mental Status: He is alert and oriented to person, place, and time.  Psychiatric:        Mood and Affect: Mood normal.        Behavior: Behavior normal.           Assessment & Plan:  1. 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 He has lumbar degenerative disc disease. Continuecurrent medication regimen withGabapentin, Continue current Medication regimen. S/P Left Lumbar Facet Radiofrequency Ablation with sedation, on 11/12/2019by Dr. Dossie Arbour with good results noted.I spoke withMr. Dunnagain and hewas instructed to call Dr. Lowella Dandy to schedule Radiofrequency, he verbalizes understanding.06/13/2019. Refilled: MS Contin30 mg one tablet every 12 hours #60 and Oxycodone 10mg  one tablet every 6 hours as needed for pain #120. We will continue the opioid monitoring program, this consists of regular clinic visits, examinations, urine drug  screen, pill counts as well as use of [place] Controlled Substance reporting System. 2. Sacroiliac Dysfunction: Continue with current medication, heat and exercise regime.06/13/2019 3. Muscle Spasm:S/P Trigger Point Injection with [name].Continue Tizanidineas needed.Continueto Monitor.06/13/2019 4. Chronic Pain Syndrome: Continue MS Contin, Oxycodone and Ibuprofen as needed for pain. Continue to Monitor.  88minutes of face to face patient care time was spent during this  visit. All questions were encouraged and answered.  F/U in 1 Month

## 2019-06-17 LAB — TOXASSURE SELECT,+ANTIDEPR,UR

## 2019-06-19 ENCOUNTER — Telehealth: Payer: Self-pay

## 2019-06-19 NOTE — Telephone Encounter (Signed)
Urine drug screen for this encounter is consistent for prescribed medication 

## 2019-07-18 ENCOUNTER — Encounter: Payer: Self-pay | Admitting: Registered Nurse

## 2019-07-18 ENCOUNTER — Other Ambulatory Visit: Payer: Self-pay

## 2019-07-18 ENCOUNTER — Encounter: Payer: PPO | Attending: Physical Medicine & Rehabilitation | Admitting: Registered Nurse

## 2019-07-18 VITALS — BP 129/76 | HR 65 | Temp 97.7°F | Ht 73.0 in | Wt 308.0 lb

## 2019-07-18 DIAGNOSIS — M79604 Pain in right leg: Secondary | ICD-10-CM | POA: Insufficient documentation

## 2019-07-18 DIAGNOSIS — Z79899 Other long term (current) drug therapy: Secondary | ICD-10-CM | POA: Diagnosis not present

## 2019-07-18 DIAGNOSIS — M961 Postlaminectomy syndrome, not elsewhere classified: Secondary | ICD-10-CM | POA: Insufficient documentation

## 2019-07-18 DIAGNOSIS — M5136 Other intervertebral disc degeneration, lumbar region: Secondary | ICD-10-CM

## 2019-07-18 DIAGNOSIS — G894 Chronic pain syndrome: Secondary | ICD-10-CM | POA: Diagnosis not present

## 2019-07-18 DIAGNOSIS — Z5181 Encounter for therapeutic drug level monitoring: Secondary | ICD-10-CM | POA: Diagnosis not present

## 2019-07-18 DIAGNOSIS — M79605 Pain in left leg: Secondary | ICD-10-CM | POA: Diagnosis not present

## 2019-07-18 DIAGNOSIS — Z981 Arthrodesis status: Secondary | ICD-10-CM | POA: Insufficient documentation

## 2019-07-18 DIAGNOSIS — M546 Pain in thoracic spine: Secondary | ICD-10-CM | POA: Diagnosis not present

## 2019-07-18 DIAGNOSIS — M5416 Radiculopathy, lumbar region: Secondary | ICD-10-CM | POA: Diagnosis not present

## 2019-07-18 DIAGNOSIS — M62830 Muscle spasm of back: Secondary | ICD-10-CM

## 2019-07-18 DIAGNOSIS — Z79891 Long term (current) use of opiate analgesic: Secondary | ICD-10-CM

## 2019-07-18 DIAGNOSIS — G8928 Other chronic postprocedural pain: Secondary | ICD-10-CM | POA: Diagnosis not present

## 2019-07-18 DIAGNOSIS — M545 Low back pain: Secondary | ICD-10-CM | POA: Diagnosis not present

## 2019-07-18 MED ORDER — TIZANIDINE HCL 2 MG PO TABS: 2.0000 mg | ORAL_TABLET | Freq: Three times a day (TID) | ORAL | 3 refills | Status: DC | PRN

## 2019-07-18 MED ORDER — GABAPENTIN 300 MG PO CAPS: ORAL_CAPSULE | ORAL | 2 refills | Status: DC

## 2019-07-18 MED ORDER — MORPHINE SULFATE ER 30 MG PO TBCR: 30.0000 mg | EXTENDED_RELEASE_TABLET | Freq: Two times a day (BID) | ORAL | 0 refills | Status: DC

## 2019-07-18 MED ORDER — OXYCODONE HCL 10 MG PO TABS: 10.0000 mg | ORAL_TABLET | Freq: Four times a day (QID) | ORAL | 0 refills | Status: DC | PRN

## 2019-07-18 NOTE — Progress Notes (Signed)
Subjective:    Patient ID: Samuel [name], male    DOB: 10/09/1975, 44 y.o.   MRN: [id number]  HPI: Samuel [name] is a 44 y.o. male who returns for follow up appointment for chronic pain and medication refill. He states his pain is located in his mid- lower back pain radiating into his bilateral lower extremities. He rates his pain 6. His current exercise regime is walking.   [name] Morphine equivalent is 120.00 MME.  Last UDS was Performed on 06/13/2019, it was consistent.    Pain Inventory Average Pain 6 Pain Right Now 6 My pain is constant, sharp, dull, stabbing and aching  In the last 24 hours, has pain interfered with the following? General activity 9 Relation with others 9 Enjoyment of life 9 What TIME of day is your pain at its worst? morning, evening, night Sleep (in general) Fair  Pain is worse with: walking, bending, sitting, inactivity and standing Pain improves with: rest and medication Relief from Meds: 5  Mobility walk with assistance use a cane how many minutes can you walk? 25 ability to climb steps?  yes do you drive?  yes  Function disabled: date disabled . I need assistance with the following:  dressing, bathing, meal prep, household duties and shopping  Neuro/Psych weakness trouble walking spasms depression  Prior Studies Any changes since last visit?  no  Physicians involved in your care Any changes since last visit?  no   Family History  Problem Relation Age of Onset  . Hypertension Father    Social History   Socioeconomic History  . Marital status: Married    Spouse name: Not on file  . Number of children: Not on file  . Years of education: Not on file  . Highest education level: Not on file  Occupational History  . Not on file  Tobacco Use  . Smoking status: Never Smoker  . Smokeless tobacco: Never Used  Substance and Sexual Activity  . Alcohol use: No    Alcohol/week: 0.0 standard drinks  . Drug use: No  .  Sexual activity: Not on file  Other Topics Concern  . Not on file  Social History Narrative  . Not on file   Social Determinants of Health   Financial Resource Strain:   . Difficulty of Paying Living Expenses: Not on file  Food Insecurity:   . Worried About Charity fundraiser in the Last Year: Not on file  . Ran Out of Food in the Last Year: Not on file  Transportation Needs:   . Lack of Transportation (Medical): Not on file  . Lack of Transportation (Non-Medical): Not on file  Physical Activity:   . Days of Exercise per Week: Not on file  . Minutes of Exercise per Session: Not on file  Stress:   . Feeling of Stress : Not on file  Social Connections:   . Frequency of Communication with Friends and Family: Not on file  . Frequency of Social Gatherings with Friends and Family: Not on file  . Attends Religious Services: Not on file  . Active Member of Clubs or Organizations: Not on file  . Attends Archivist Meetings: Not on file  . Marital Status: Not on file   Past Surgical History:  Procedure Laterality Date  . BACK SURGERY     lower back surgery x 2   Past Medical History:  Diagnosis Date  . Arthritis    "some in my back"  .  GERD (gastroesophageal reflux disease)   . Headache(784.0)    BP 129/76   Pulse 65   Temp 97.7 F (36.5 C)   Ht 6\' 1"  (1.854 m)   Wt (!) 308 lb (139.7 kg)   SpO2 97%   BMI 40.64 kg/m   Opioid Risk Score:   Fall Risk Score:  `1  Depression screen PHQ 2/9  Depression screen [facility] 2/9 06/13/2019 03/13/2019 06/14/2018 04/12/2018 04/05/2018 02/17/2018 02/15/2018  Decreased Interest 1 1 0 0 0 0 1  Down, Depressed, Hopeless 1 1 0 0 0 0 1  PHQ - 2 Score 2 2 0 0 0 0 2  Altered sleeping - - - - - - -  Tired, decreased energy - - - - - - -  Change in appetite - - - - - - -  Feeling bad or failure about yourself  - - - - - - -  Trouble concentrating - - - - - - -  Moving slowly or fidgety/restless - - - - - - -  Suicidal thoughts - - -  - - - -  PHQ-9 Score - - - - - - -  Difficult doing work/chores - - - - - - -    Review of Systems  Constitutional: Negative.   HENT: Negative.   Respiratory: Negative.   Cardiovascular: Negative.   Gastrointestinal: Negative.   Endocrine: Negative.   Genitourinary: Negative.   Musculoskeletal: Positive for back pain and gait problem.       Spasms   Skin: Negative.   Allergic/Immunologic: Negative.   Neurological: Positive for weakness.  Hematological: Negative.   Psychiatric/Behavioral: Positive for dysphoric mood.  All other systems reviewed and are negative.      Objective:   Physical Exam Vitals and nursing note reviewed.  Constitutional:      Appearance: Normal appearance.  Cardiovascular:     Rate and Rhythm: Normal rate and regular rhythm.     Pulses: Normal pulses.     Heart sounds: Normal heart sounds.  Musculoskeletal:     Cervical back: Normal range of motion and neck supple.     Comments: Normal Muscle Bulk and Muscle Testing Reveals:  Upper Extremities: Full ROM and Muscle Strength 5/5 Thoracic Paraspinal Tenderness: T-7-T-9 Lumbar Paraspinal Tenderness: L-3-L-5 Lower Extremities: Decreased ROM and Muscle Strength 5/5 Bilateral Lower Extremities Flexion Produces Pain into his Lumbar Arises From Table Slowly Antalgic  Gait   Neurological:     Mental Status: He is alert and oriented to person, place, and time.  Psychiatric:        Mood and Affect: Mood normal.        Behavior: Behavior normal.           Assessment & Plan:  1. 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 He has lumbar degenerative disc disease. Continuecurrent medication regimen withGabapentin, Continue current Medication regimen. S/P Left Lumbar Facet Radiofrequency Ablation with sedation, on 11/12/2019by Dr. Dossie Arbour with good results noted.I spoke withMr. Dunnagain and hewas instructed to call Dr. Lowella Dandy to schedule Radiofrequency, he  verbalizes understanding.07/17/2019. Refilled: MS Contin30 mg one tablet every 12 hours #60 and Oxycodone 10mg  one tablet every 6 hours as needed for pain #120. We will continue the opioid monitoring program, this consists of regular clinic visits, examinations, urine drug screen, pill counts as well as use of [place] Controlled Substance reporting System. 2. Sacroiliac Dysfunction: Continue with current medication, heat and exercise regime.07/17/2019 3. Muscle Spasm:Continue Tizanidineas needed.Continueto  Monitor.07/17/2019 4. Chronic Pain Syndrome: Continue MS Contin, Oxycodone and Ibuprofen as needed for pain. Continue to Monitor.   19minutes of face to face patient care time was spent during this visit. All questions were encouraged and answered.  F/U in 1 Month

## 2019-08-15 ENCOUNTER — Other Ambulatory Visit: Payer: Self-pay

## 2019-08-15 ENCOUNTER — Encounter: Payer: PPO | Attending: Physical Medicine & Rehabilitation | Admitting: Registered Nurse

## 2019-08-15 ENCOUNTER — Encounter: Payer: Self-pay | Admitting: Registered Nurse

## 2019-08-15 ENCOUNTER — Telehealth: Payer: Self-pay

## 2019-08-15 VITALS — BP 124/76 | HR 63 | Temp 97.3°F | Ht 73.0 in | Wt 320.0 lb

## 2019-08-15 DIAGNOSIS — M545 Low back pain: Secondary | ICD-10-CM | POA: Diagnosis not present

## 2019-08-15 DIAGNOSIS — Z79891 Long term (current) use of opiate analgesic: Secondary | ICD-10-CM

## 2019-08-15 DIAGNOSIS — Z981 Arthrodesis status: Secondary | ICD-10-CM | POA: Insufficient documentation

## 2019-08-15 DIAGNOSIS — Z5181 Encounter for therapeutic drug level monitoring: Secondary | ICD-10-CM

## 2019-08-15 DIAGNOSIS — M79605 Pain in left leg: Secondary | ICD-10-CM | POA: Diagnosis not present

## 2019-08-15 DIAGNOSIS — M79604 Pain in right leg: Secondary | ICD-10-CM | POA: Diagnosis not present

## 2019-08-15 DIAGNOSIS — M546 Pain in thoracic spine: Secondary | ICD-10-CM | POA: Insufficient documentation

## 2019-08-15 DIAGNOSIS — M5136 Other intervertebral disc degeneration, lumbar region: Secondary | ICD-10-CM | POA: Diagnosis not present

## 2019-08-15 DIAGNOSIS — G894 Chronic pain syndrome: Secondary | ICD-10-CM | POA: Diagnosis not present

## 2019-08-15 DIAGNOSIS — Z79899 Other long term (current) drug therapy: Secondary | ICD-10-CM | POA: Insufficient documentation

## 2019-08-15 DIAGNOSIS — G8928 Other chronic postprocedural pain: Secondary | ICD-10-CM | POA: Diagnosis not present

## 2019-08-15 DIAGNOSIS — M51369 Other intervertebral disc degeneration, lumbar region without mention of lumbar back pain or lower extremity pain: Secondary | ICD-10-CM

## 2019-08-15 DIAGNOSIS — M5416 Radiculopathy, lumbar region: Secondary | ICD-10-CM

## 2019-08-15 DIAGNOSIS — M961 Postlaminectomy syndrome, not elsewhere classified: Secondary | ICD-10-CM

## 2019-08-15 DIAGNOSIS — M62830 Muscle spasm of back: Secondary | ICD-10-CM

## 2019-08-15 MED ORDER — MORPHINE SULFATE ER 30 MG PO TBCR: 30.0000 mg | EXTENDED_RELEASE_TABLET | Freq: Two times a day (BID) | ORAL | 0 refills | Status: DC

## 2019-08-15 MED ORDER — OXYCODONE HCL 10 MG PO TABS: 10.0000 mg | ORAL_TABLET | Freq: Four times a day (QID) | ORAL | 0 refills | Status: DC | PRN

## 2019-08-15 NOTE — Progress Notes (Signed)
Subjective:    Patient ID: Samuel [name], male    DOB: 1976-05-18, 44 y.o.   MRN: [id number]  HPI: Samuel [name] is a 44 y.o. male who returns for follow up appointment for chronic pain and medication refill. He states his pain is located in his lower back radiating into his lower back pain radiating into his bilateral lower extremities. He rates his pain 6. His current exercise regime is walking and performing stretching exercises.  Samuel [name] Morphine equivalent is 120.00 MME.  Last UDS was Performed on 06/13/2019, it was consistent.    Pain Inventory Average Pain 6 Pain Right Now 6 My pain is constant, sharp, dull, stabbing and aching  In the last 24 hours, has pain interfered with the following? General activity 9 Relation with others 9 Enjoyment of life 9 What TIME of day is your pain at its worst? all Sleep (in general) Fair  Pain is worse with: walking, bending, sitting and standing Pain improves with: rest and medication Relief from Meds: 5  Mobility use a cane ability to climb steps?  yes do you drive?  yes  Function disabled: date disabled . I need assistance with the following:  dressing, bathing, meal prep, household duties and shopping  Neuro/Psych weakness trouble walking spasms depression  Prior Studies Any changes since last visit?  no  Physicians involved in your care Any changes since last visit?  no   Family History  Problem Relation Age of Onset  . Hypertension Father    Social History   Socioeconomic History  . Marital status: Married    Spouse name: Not on file  . Number of children: Not on file  . Years of education: Not on file  . Highest education level: Not on file  Occupational History  . Not on file  Tobacco Use  . Smoking status: Never Smoker  . Smokeless tobacco: Never Used  Substance and Sexual Activity  . Alcohol use: No    Alcohol/week: 0.0 standard drinks  . Drug use: No  . Sexual activity: Not on file    Other Topics Concern  . Not on file  Social History Narrative  . Not on file   Social Determinants of Health   Financial Resource Strain:   . Difficulty of Paying Living Expenses:   Food Insecurity:   . Worried About Charity fundraiser in the Last Year:   . Arboriculturist in the Last Year:   Transportation Needs:   . Film/video editor (Medical):   Marland Kitchen Lack of Transportation (Non-Medical):   Physical Activity:   . Days of Exercise per Week:   . Minutes of Exercise per Session:   Stress:   . Feeling of Stress :   Social Connections:   . Frequency of Communication with Friends and Family:   . Frequency of Social Gatherings with Friends and Family:   . Attends Religious Services:   . Active Member of Clubs or Organizations:   . Attends Archivist Meetings:   Marland Kitchen Marital Status:    Past Surgical History:  Procedure Laterality Date  . BACK SURGERY     lower back surgery x 2   Past Medical History:  Diagnosis Date  . Arthritis    "some in my back"  . GERD (gastroesophageal reflux disease)   . Headache(784.0)    BP 124/76   Pulse 63   Temp (!) 97.3 F (36.3 C)   Ht 6\' 1"  (1.854 m)  Wt (!) 320 lb (145.2 kg)   SpO2 98%   BMI 42.22 kg/m   Opioid Risk Score:   Fall Risk Score:  `1  Depression screen PHQ 2/9  Depression screen [facility] 2/9 06/13/2019 03/13/2019 06/14/2018 04/12/2018 04/05/2018 02/17/2018 02/15/2018  Decreased Interest 1 1 0 0 0 0 1  Down, Depressed, Hopeless 1 1 0 0 0 0 1  PHQ - 2 Score 2 2 0 0 0 0 2  Altered sleeping - - - - - - -  Tired, decreased energy - - - - - - -  Change in appetite - - - - - - -  Feeling bad or failure about yourself  - - - - - - -  Trouble concentrating - - - - - - -  Moving slowly or fidgety/restless - - - - - - -  Suicidal thoughts - - - - - - -  PHQ-9 Score - - - - - - -  Difficult doing work/chores - - - - - - -     Review of Systems  Constitutional: Negative.   HENT: Negative.   Eyes: Negative.    Respiratory: Negative.   Cardiovascular: Negative.   Gastrointestinal: Negative.   Endocrine: Negative.   Genitourinary: Negative.   Musculoskeletal: Positive for arthralgias, back pain, gait problem and myalgias.  Skin: Negative.   Allergic/Immunologic: Negative.   Neurological: Positive for weakness.  Hematological: Negative.   Psychiatric/Behavioral: Positive for dysphoric mood.  All other systems reviewed and are negative.      Objective:   Physical Exam Vitals and nursing note reviewed.  Constitutional:      Appearance: Normal appearance.  Cardiovascular:     Rate and Rhythm: Normal rate and regular rhythm.     Pulses: Normal pulses.     Heart sounds: Normal heart sounds.  Pulmonary:     Effort: Pulmonary effort is normal.     Breath sounds: Normal breath sounds.  Musculoskeletal:     Cervical back: Normal range of motion and neck supple.     Comments: Normal Muscle Bulk and Muscle Testing Reveals:  Upper Extremities: Full ROM and Muscle Strength 5/5 Thoracic Paraspinal Tenderness: T-7-T-9 Mainly Right Side Lumbar Hypersensitivity Lower Extremities: Full ROM and Muscle Strength 5/5 Arises from Table Slowly using cane Antalgic Gait   Skin:    General: Skin is warm and dry.  Neurological:     Mental Status: He is alert and oriented to person, place, and time.  Psychiatric:        Mood and Affect: Mood normal.        Behavior: Behavior normal.           Assessment & Plan:  1. 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 He has lumbar degenerative disc disease. Continuecurrent medication regimen withGabapentin, Continue current Medication regimen. S/P Left Lumbar Facet Radiofrequency Ablation with sedation, on 11/12/2019by Dr. Dossie [name] with good results noted.I spoke withMr. Dunnagain today and hewas instructed to call Samuel [name] to schedule Radiofrequency, he verbalizes understanding.08/15/2019. Refilled: MS Contin30  mg one tablet every 12 hours #60 and Oxycodone 10mg  one tablet every 6 hours as needed for pain #120. We will continue the opioid monitoring program, this consists of regular clinic visits, examinations, urine drug screen, pill counts as well as use of [place] Controlled Substance reporting System. 2. Sacroiliac Dysfunction: Continue with current medication, heat and exercise regime.08/15/2019 3. Muscle Spasm:Continue Tizanidineas needed.Continueto Monitor.08/15/2019 4. Chronic Pain Syndrome: Continue MS Contin, Oxycodone  andIbuprofen as needed for pain. Continue to Monitor.08/15/2019.  66minutes of face to face patient care time was spent during this visit. All questions were encouraged and answered.  F/U in 1 Month

## 2019-08-15 NOTE — Telephone Encounter (Signed)
Samuel [name] with [facility] called and states ptn never received morphine before she must talk to you before filling:  [phone]

## 2019-08-15 NOTE — Telephone Encounter (Signed)
Samuel [name] needed a diagnosis code for the medication

## 2019-09-14 ENCOUNTER — Other Ambulatory Visit: Payer: Self-pay

## 2019-09-14 ENCOUNTER — Encounter: Payer: PPO | Attending: Physical Medicine & Rehabilitation | Admitting: Registered Nurse

## 2019-09-14 ENCOUNTER — Encounter: Payer: Self-pay | Admitting: Registered Nurse

## 2019-09-14 VITALS — BP 123/67 | HR 66 | Temp 97.7°F | Ht 73.0 in | Wt 325.0 lb

## 2019-09-14 DIAGNOSIS — Z981 Arthrodesis status: Secondary | ICD-10-CM | POA: Diagnosis not present

## 2019-09-14 DIAGNOSIS — Z5181 Encounter for therapeutic drug level monitoring: Secondary | ICD-10-CM | POA: Insufficient documentation

## 2019-09-14 DIAGNOSIS — G894 Chronic pain syndrome: Secondary | ICD-10-CM | POA: Diagnosis not present

## 2019-09-14 DIAGNOSIS — M5136 Other intervertebral disc degeneration, lumbar region: Secondary | ICD-10-CM

## 2019-09-14 DIAGNOSIS — M545 Low back pain: Secondary | ICD-10-CM | POA: Insufficient documentation

## 2019-09-14 DIAGNOSIS — Z79891 Long term (current) use of opiate analgesic: Secondary | ICD-10-CM | POA: Insufficient documentation

## 2019-09-14 DIAGNOSIS — Z79899 Other long term (current) drug therapy: Secondary | ICD-10-CM | POA: Insufficient documentation

## 2019-09-14 DIAGNOSIS — M961 Postlaminectomy syndrome, not elsewhere classified: Secondary | ICD-10-CM | POA: Insufficient documentation

## 2019-09-14 DIAGNOSIS — M546 Pain in thoracic spine: Secondary | ICD-10-CM | POA: Diagnosis not present

## 2019-09-14 DIAGNOSIS — M79605 Pain in left leg: Secondary | ICD-10-CM | POA: Diagnosis not present

## 2019-09-14 DIAGNOSIS — M5416 Radiculopathy, lumbar region: Secondary | ICD-10-CM | POA: Diagnosis not present

## 2019-09-14 DIAGNOSIS — M79604 Pain in right leg: Secondary | ICD-10-CM | POA: Diagnosis not present

## 2019-09-14 DIAGNOSIS — G8928 Other chronic postprocedural pain: Secondary | ICD-10-CM | POA: Insufficient documentation

## 2019-09-14 DIAGNOSIS — M62830 Muscle spasm of back: Secondary | ICD-10-CM

## 2019-09-14 MED ORDER — OXYCODONE HCL 10 MG PO TABS: 10.0000 mg | ORAL_TABLET | Freq: Four times a day (QID) | ORAL | 0 refills | Status: DC | PRN

## 2019-09-14 MED ORDER — MORPHINE SULFATE ER 30 MG PO TBCR: 30.0000 mg | EXTENDED_RELEASE_TABLET | Freq: Two times a day (BID) | ORAL | 0 refills | Status: DC

## 2019-09-14 NOTE — Progress Notes (Signed)
Subjective:    Patient ID: Samuel [name], male    DOB: 03-12-1976, 44 y.o.   MRN: [id number]  HPI: Samuel [name] is a 44 y.o. male who returns for follow up appointment for chronic pain and medication refill. He states his pain is located in his lower back radiating into his bilateral lower extremities. He rates his pain 7. His current exercise regime is walking.   Samuel [name] Morphine [name] is 120.00 MME.  Last UDS was Performed on 06/13/2019, it was consistent.   Pain Inventory Average Pain 6 Pain Right Now 7 My pain is sharp, burning, dull, stabbing and aching  In the last 24 hours, has pain interfered with the following? General activity 8 Relation with others 9 Enjoyment of life 9 What TIME of day is your pain at its worst? morning, night  Sleep (in general) Fair  Pain is worse with: walking, bending, sitting, inactivity and standing Pain improves with: rest and medication Relief from Meds: 5  Mobility walk with assistance use a cane how many minutes can you walk? 2 ability to climb steps?  yes do you drive?  yes Do you have any goals in this area?  no  Function disabled: date disabled . I need assistance with the following:  dressing, bathing, meal prep, household duties and shopping Do you have any goals in this area?  no  Neuro/Psych weakness spasms depression  Prior Studies Any changes since last visit?  no  Physicians involved in your care Any changes since last visit?  no   Family History  Problem Relation Age of Onset  . Hypertension Father    Social History   Socioeconomic History  . Marital status: Married    Spouse name: Not on file  . Number of children: Not on file  . Years of education: Not on file  . Highest education level: Not on file  Occupational History  . Not on file  Tobacco Use  . Smoking status: Never Smoker  . Smokeless tobacco: Never Used  Substance and Sexual Activity  . Alcohol use: No    Alcohol/week:  0.0 standard drinks  . Drug use: No  . Sexual activity: Not on file  Other Topics Concern  . Not on file  Social History Narrative  . Not on file   Social Determinants of Health   Financial Resource Strain:   . Difficulty of Paying Living Expenses:   Food Insecurity:   . Worried About Charity fundraiser in the Last Year:   . Arboriculturist in the Last Year:   Transportation Needs:   . Film/video editor (Medical):   Marland Kitchen Lack of Transportation (Non-Medical):   Physical Activity:   . Days of Exercise per Week:   . Minutes of Exercise per Session:   Stress:   . Feeling of Stress :   Social Connections:   . Frequency of Communication with Friends and Family:   . Frequency of Social Gatherings with Friends and Family:   . Attends Religious Services:   . Active Member of Clubs or Organizations:   . Attends Archivist Meetings:   Marland Kitchen Marital Status:    Past Surgical History:  Procedure Laterality Date  . BACK SURGERY     lower back surgery x 2   Past Medical History:  Diagnosis Date  . Arthritis    "some in my back"  . GERD (gastroesophageal reflux disease)   . Headache(784.0)    Temp  97.7 F (36.5 C)   Ht 6\' 1"  (1.854 m)   Wt (!) 325 lb (147.4 kg)   BMI 42.88 kg/m   Opioid Risk Score:   Fall Risk Score:  `1  Depression screen PHQ 2/9  Depression screen [facility] 2/9 06/13/2019 03/13/2019 06/14/2018 04/12/2018 04/05/2018 02/17/2018 02/15/2018  Decreased Interest 1 1 0 0 0 0 1  Down, Depressed, Hopeless 1 1 0 0 0 0 1  PHQ - 2 Score 2 2 0 0 0 0 2  Altered sleeping - - - - - - -  Tired, decreased energy - - - - - - -  Change in appetite - - - - - - -  Feeling bad or failure about yourself  - - - - - - -  Trouble concentrating - - - - - - -  Moving slowly or fidgety/restless - - - - - - -  Suicidal thoughts - - - - - - -  PHQ-9 Score - - - - - - -  Difficult doing work/chores - - - - - - -    Review of Systems  Constitutional: Negative.   HENT: Negative.    Eyes: Negative.   Respiratory: Negative.   Cardiovascular: Negative.   Gastrointestinal: Negative.   Endocrine: Negative.   Genitourinary: Negative.   Musculoskeletal: Positive for back pain and gait problem.       Spasms   Skin: Negative.   Allergic/Immunologic: Negative.   Neurological: Positive for weakness.  Hematological: Negative.   Psychiatric/Behavioral: Negative.   All other systems reviewed and are negative.      Objective:   Physical Exam Vitals and nursing note reviewed.  Constitutional:      Appearance: Normal appearance.  Cardiovascular:     Rate and Rhythm: Normal rate and regular rhythm.     Pulses: Normal pulses.     Heart sounds: Normal heart sounds.  Pulmonary:     Effort: Pulmonary effort is normal.     Breath sounds: Normal breath sounds.  Musculoskeletal:     Cervical back: Normal range of motion and neck supple.     Comments: Normal Muscle Bulk and Muscle Testing Reveals:  Upper Extremities: Full ROM and Muscle Strength 5/5 Thoracic Paraspinal Tenderness: T-7-T-9 Lumbar Hypersensitivity Lower Extremities: Decreased ROM and Muscle Strength 5/5 Bilateral Lower Extremities Flexion Produces Pain into Lumbar  Arises from Table Slowly, using cane for support Antalgic  Gait   Skin:    General: Skin is warm and dry.  Neurological:     Mental Status: He is alert and oriented to person, place, and time.  Psychiatric:        Mood and Affect: Mood normal.        Behavior: Behavior normal.           Assessment & Plan:  1. 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 He has lumbar degenerative disc disease. Continuecurrent medication regimen withGabapentin, Continue current Medication regimen. S/P Left Lumbar Facet Radiofrequency Ablation with sedation, on 11/12/2019by Dr. Dossie Arbour with good results noted.I spoke withMr. Dunnagain today and hewas instructed to call Dr. Lowella Dandy to schedule Radiofrequency, he  verbalizes understanding.09/14/2019. Refilled: MS Contin30 mg one tablet every 12 hours #60 and Oxycodone 10mg  one tablet every 6 hours as needed for pain #120. We will continue the opioid monitoring program, this consists of regular clinic visits, examinations, urine drug screen, pill counts as well as use of [place] Controlled Substance reporting System. 2. Sacroiliac Dysfunction: Continue  with current medication, heat and exercise regime.09/14/2019 3. Muscle Spasm:Continue Tizanidineas needed.Continueto Monitor.09/14/2019 4. Chronic Pain Syndrome: Continue MS Contin, Oxycodone andIbuprofen as needed for pain. Continue to Monitor.09/14/2019.  61minutes of face to face patient care time was spent during this visit. All questions were encouraged and answered.  F/U in 1 Month

## 2019-10-05 ENCOUNTER — Other Ambulatory Visit: Payer: Self-pay

## 2019-10-05 ENCOUNTER — Ambulatory Visit: Payer: PPO | Attending: Pain Medicine | Admitting: Pain Medicine

## 2019-10-05 DIAGNOSIS — G8929 Other chronic pain: Secondary | ICD-10-CM | POA: Diagnosis not present

## 2019-10-05 DIAGNOSIS — M47816 Spondylosis without myelopathy or radiculopathy, lumbar region: Secondary | ICD-10-CM

## 2019-10-05 DIAGNOSIS — M5442 Lumbago with sciatica, left side: Secondary | ICD-10-CM

## 2019-10-05 NOTE — Progress Notes (Signed)
Patient: Samuel [name]  Service Category: E/M  Provider: Gaspar Cola, MD  DOB: 1975/10/06  DOS: 10/05/2019  Location: Office  MRN: [id number]  Setting: Ambulatory outpatient  Referring Provider: No ref. provider found  Type: Established Patient  Specialty: Interventional Pain Management  PCP: System, Pcp Not In  Location: Remote location  Delivery: TeleHealth     Virtual Encounter - Pain Management PROVIDER NOTE: Information contained herein reflects review and annotations entered in association with encounter. Interpretation of such information and data should be left to medically-trained personnel. Information provided to patient can be located elsewhere in the medical record under "Patient Instructions". Document created using STT-dictation technology, any transcriptional errors that may result from process are unintentional.    Contact & Pharmacy Preferred: [phone] Home: [phone] (home) Mobile: [phone] (mobile) E-mail: [email or web address]  [place], [place] [address] Phone: [phone] Fax: [phone]  [facility] [address], [place] [address] Phone: [phone] Fax: [phone]   Pre-screening  Mr. Noyola offered "in-person" vs "virtual" encounter. He indicated preferring virtual for this encounter.   Reason COVID-19*  Social distancing based on CDC and AMA recommendations.   I contacted Sherrel Shafer on 10/05/2019 via telephone.      I clearly identified myself as Gaspar Cola, MD. I verified that I was speaking with the correct person using two identifiers (Name: Antonios Ostrow, and date of birth: 1976/01/03).  Consent I sought verbal advanced consent from Clyde Canterbury for virtual visit interactions. I informed Mr. Rucinski of possible security and privacy concerns, risks, and limitations associated with providing  "not-in-person" medical evaluation and management services. I also informed Mr. Levene of the availability of "in-person" appointments. Finally, I informed him that there would be a charge for the virtual visit and that he could be  personally, fully or partially, financially responsible for it. Mr. Rigg expressed understanding and agreed to proceed.   Historic Elements   Mr. Vinton Layson is a 44 y.o. year old, male patient evaluated today after his last contact with our practice on Visit date not found. Mr. Mckelvin  has a past medical history of Arthritis, GERD (gastroesophageal reflux disease), and Headache(784.0). He also  has a past surgical history that includes Back surgery. Mr. Hinchliffe has a current medication list which includes the following prescription(s): gabapentin, ibuprofen, morphine, OVER THE COUNTER MEDICATION, oxycodone hcl, and tizanidine. He  reports that he has never smoked. He has never used smokeless tobacco. He reports that he does not drink alcohol or use drugs. Mr. Duckett has No Known Allergies.   HPI  Today, he is being contacted for worsening of previously known (established) problem.  The last time we contacted the patient was on 04/05/2018 when he had his second lumbar facet RFA done.  Unfortunately, he has the habit of not being available for the postop evaluations and therefore we have no information on how he has done after the last 2 radiofrequency ablations.  The patient indicated that he did have follow-up with his other pain physician in [place] because of the convenience.  In summary, he stated that the radiofrequency ablations done in 2019 provided him with over 60% relief of the lower extremity pain for a period of 13 to 14 months and it is for this reason that he would like it repeated.  Since the pain is worse on the  left side he will like to start with that side first.  Medical Necessity: Mr. Belson has experienced debilitating chronic pain from the Lumbosacral Facet  Syndrome (Spondylosis without myelopathy or radiculopathy, lumbosacral region [M47.817]) that has persisted for longer than three months of failed non-surgical care and has either failed to respond, or was unable to tolerate, or simply did not get enough benefit from other more conservative therapies including, but not limited to: 1. Over-the-counter oral analgesic medications (i.e.: ibuprofen, naproxen, etc.) 2. Anti-inflammatory medications 3. Muscle relaxants 4. Membrane stabilizers 5. Opioids 6. Physical therapy (PT), chiropractic manipulation, and/or home exercise program (HEP). 7. Modalities (Heat, ice, etc.) 8. Invasive techniques such as nerve blocks and/or surgery.  Mr. Shipes has attained greater than 50% reduction in pain from two prior lumbar facet radiofrequency ablations that have lasted 13 to 14 months conducted in separate occasions. For this reason, I believe it is medically necessary to proceed with Non-Pulsed Radiofrequency Ablation for the purpose of attempting to prolong the duration of the benefits seen with the diagnostic injections.  Pharmacotherapy Assessment  Analgesic: No opioid analgesics prescribed by our practice.   Monitoring: [place] PMP: PDMP reviewed during this encounter.       Pharmacotherapy: No side-effects or adverse reactions reported. Compliance: No problems identified. Effectiveness: Clinically acceptable. Plan: Refer to "POC".  UDS:  Summary  Date Value Ref Range Status  06/13/2019 Note  Final    Comment:    ==================================================================== ToxAssure Select,+Antidepr,UR ==================================================================== Test                             Result       Flag       Units Drug Present   Morphine                       >4630                   ng/mg creat   Normorphine                    161                     ng/mg creat    Potential sources of large amounts of morphine in the absence  of    codeine include administration of morphine or use of heroin.    Normorphine is an expected metabolite of morphine.   Hydromorphone                  291                     ng/mg creat    Hydromorphone may be present as a metabolite of morphine;    concentrations of hydromorphone rarely exceed 5% of the morphine    concentration when this is the source of hydromorphone.   Oxycodone                      >4630                   ng/mg creat   Oxymorphone                    354                     ng/mg creat   Noroxycodone                   >  4630                   ng/mg creat    Sources of oxycodone include scheduled prescription medications.    Oxymorphone and noroxycodone are expected metabolites of oxycodone.    Oxymorphone is also available as a scheduled prescription medication. ==================================================================== Test                      Result    Flag   Units      Ref Range   Creatinine              216              mg/dL      >=20 ==================================================================== Declared Medications:  Medication list was not provided. ==================================================================== For clinical consultation, please call [phone]. ====================================================================    Laboratory Chemistry Profile   Renal Lab Results  Component Value Date   BUN 9 06/13/2013   CREATININE 0.97 06/13/2013   LABCREA 113.87 02/04/2015   GFRAA >90 06/13/2013   GFRNONAA >90 06/13/2013     Hepatic Lab Results  Component Value Date   AST 36 06/02/2013   ALT 47 06/02/2013   ALBUMIN 3.5 06/02/2013   ALKPHOS 78 06/02/2013     Electrolytes Lab Results  Component Value Date   NA 136 (L) 06/13/2013   K 4.4 06/13/2013   CL 100 06/13/2013   CALCIUM 8.1 (L) 06/13/2013     Bone No results found.   Inflammation (CRP: Acute Phase) (ESR: Chronic Phase) No results found.      Note: Above Lab results reviewed.  Imaging  DG Thoracic Spine 2 View CLINICAL DATA:  Thoracic back pain for 2 months.  EXAM: THORACIC SPINE 2 VIEWS  COMPARISON:  None.  FINDINGS: There is no evidence of thoracic spine fracture. Alignment is normal. No other significant bone abnormalities are identified.  IMPRESSION: Negative.  Electronically Signed   By: Marlaine Hind M.D.   On: 05/10/2019 08:48  Assessment  The primary encounter diagnosis was Lumbar facet syndrome (Bilateral) (R>L). A diagnosis of Chronic low back pain (Primary Source of Pain) (Bilateral) (R>L) was also pertinent to this visit.  Plan of Care  Problem-specific:  No problem-specific Assessment & Plan notes found for this encounter.  Mr. Graves Nipp has a current medication list which includes the following long-term medication(s): gabapentin.  Pharmacotherapy (Medications Ordered): No orders of the defined types were placed in this encounter.  Orders:  Orders Placed This Encounter  Procedures  . Radiofrequency,Lumbar    Standing Status:   Future    Standing Expiration Date:   04/06/2021    Scheduling Instructions:     Procedure: Palliative left-sided lumbar facet radiofrequency ablation #3 under fluoroscopic guidance and IV sedation.     Side(s): Left-sided     Level: L3-4, L4-5, & L5-S1 Facets (L2, L3, L4, L5, & S1 Medial Branch Nerves)     Sedation: With Sedation     Scheduling Timeframe: As soon as pre-approved    Order Specific Question:   Where will this procedure be performed?    Answer:   [facility]   Follow-up plan:   Return for RFA (w/ sedation): (L) L-FCT RFA #3.      Interventional treatment options: Planned, scheduled, and/or pending:   We will be scheduling the patient to return for a left-sided lumbar facet RFA #3 under fluoroscopic guidance and IV sedation.  Once we complete the left  side, he wants to have the right side done.  These radiofrequencies were completed  around 2019 and they have provided him with over 60% relief of the pain for 13 to 14 months.  We will continue to them for palliative reasons, but I have suggested to the patient to consider the possibility of an intrathecal pump implant.   Under consideration:   Palliative radiofrequency ablations of the lumbar facets   Therapeutic/palliative (PRN):   Palliative right lumbar facet RFA #3 (last done 02/17/2018) Palliative left lumbar facet RFA #3 (last done 04/05/2018)    Recent Visits No visits were found meeting these conditions.  Showing recent visits within past 90 days and meeting all other requirements   Today's Visits Date Type Provider Dept  10/05/19 Telemedicine Milinda Pointer, MD [facility]  Showing today's visits and meeting all other requirements   Future Appointments No visits were found meeting these conditions.  Showing future appointments within next 90 days and meeting all other requirements   I discussed the assessment and treatment plan with the patient. The patient was provided an opportunity to ask questions and all were answered. The patient agreed with the plan and demonstrated an understanding of the instructions.  Patient advised to call back or seek an in-person evaluation if the symptoms or condition worsens.  Duration of encounter: 14 minutes.  Note by: Gaspar Cola, MD Date: 10/05/2019; Time: 11:32 AM

## 2019-10-05 NOTE — Patient Instructions (Signed)
____________________________________________________________________________________________  Preparing for Procedure with Sedation  Procedure appointments are limited to planned procedures: . No Prescription Refills. . No disability issues will be discussed. . No medication changes will be discussed.  Instructions: . Oral Intake: Do not eat or drink anything for at least 3 hours prior to your procedure. (Exception: Blood Pressure Medication. See below.) . Transportation: Unless otherwise stated by your physician, you may drive yourself after the procedure. . Blood Pressure Medicine: Do not forget to take your blood pressure medicine with a sip of water the morning of the procedure. If your Diastolic (lower reading)is above 100 mmHg, elective cases will be cancelled/rescheduled. . Blood thinners: These will need to be stopped for procedures. Notify our staff if you are taking any blood thinners. Depending on which one you take, there will be specific instructions on how and when to stop it. . Diabetics on insulin: Notify the staff so that you can be scheduled 1st case in the morning. If your diabetes requires high dose insulin, take only  of your normal insulin dose the morning of the procedure and notify the staff that you have done so. . Preventing infections: Shower with an antibacterial soap the morning of your procedure. . Build-up your immune system: Take 1000 mg of Vitamin C with every meal (3 times a day) the day prior to your procedure. . Antibiotics: Inform the staff if you have a condition or reason that requires you to take antibiotics before dental procedures. . Pregnancy: If you are pregnant, call and cancel the procedure. . Sickness: If you have a cold, fever, or any active infections, call and cancel the procedure. . Arrival: You must be in the facility at least 30 minutes prior to your scheduled procedure. . Children: Do not bring children with you. . Dress appropriately:  Bring dark clothing that you would not mind if they get stained. . Valuables: Do not bring any jewelry or valuables.  Reasons to call and reschedule or cancel your procedure: (Following these recommendations will minimize the risk of a serious complication.) . Surgeries: Avoid having procedures within 2 weeks of any surgery. (Avoid for 2 weeks before or after any surgery). . Flu Shots: Avoid having procedures within 2 weeks of a flu shots or . (Avoid for 2 weeks before or after immunizations). . Barium: Avoid having a procedure within 7-10 days after having had a radiological study involving the use of radiological contrast. (Myelograms, Barium swallow or enema study). . Heart attacks: Avoid any elective procedures or surgeries for the initial 6 months after a "Myocardial Infarction" (Heart Attack). . Blood thinners: It is imperative that you stop these medications before procedures. Let us know if you if you take any blood thinner.  . Infection: Avoid procedures during or within two weeks of an infection (including chest colds or gastrointestinal problems). Symptoms associated with infections include: Localized redness, fever, chills, night sweats or profuse sweating, burning sensation when voiding, cough, congestion, stuffiness, runny nose, sore throat, diarrhea, nausea, vomiting, cold or Flu symptoms, recent or current infections. It is specially important if the infection is over the area that we intend to treat. . Heart and lung problems: Symptoms that may suggest an active cardiopulmonary problem include: cough, chest pain, breathing difficulties or shortness of breath, dizziness, ankle swelling, uncontrolled high or unusually low blood pressure, and/or palpitations. If you are experiencing any of these symptoms, cancel your procedure and contact your primary care physician for an evaluation.  Remember:  Regular Business hours are:    Monday to Thursday 8:00 AM to 4:00 PM  Provider's  Schedule: Ilda Laskin, MD:  Procedure days: Tuesday and Thursday 7:30 AM to 4:00 PM  Bilal Lateef, MD:  Procedure days: Monday and Wednesday 7:30 AM to 4:00 PM ____________________________________________________________________________________________    

## 2019-10-17 ENCOUNTER — Encounter: Payer: PPO | Attending: Physical Medicine & Rehabilitation | Admitting: Registered Nurse

## 2019-10-17 ENCOUNTER — Encounter: Payer: Self-pay | Admitting: Registered Nurse

## 2019-10-17 ENCOUNTER — Other Ambulatory Visit: Payer: Self-pay

## 2019-10-17 VITALS — BP 122/76 | HR 71 | Temp 97.7°F | Ht 73.0 in | Wt 320.0 lb

## 2019-10-17 DIAGNOSIS — G894 Chronic pain syndrome: Secondary | ICD-10-CM | POA: Diagnosis not present

## 2019-10-17 DIAGNOSIS — M62830 Muscle spasm of back: Secondary | ICD-10-CM | POA: Diagnosis not present

## 2019-10-17 DIAGNOSIS — Z981 Arthrodesis status: Secondary | ICD-10-CM | POA: Diagnosis not present

## 2019-10-17 DIAGNOSIS — M545 Low back pain: Secondary | ICD-10-CM | POA: Insufficient documentation

## 2019-10-17 DIAGNOSIS — M546 Pain in thoracic spine: Secondary | ICD-10-CM | POA: Insufficient documentation

## 2019-10-17 DIAGNOSIS — M5136 Other intervertebral disc degeneration, lumbar region: Secondary | ICD-10-CM

## 2019-10-17 DIAGNOSIS — Z79899 Other long term (current) drug therapy: Secondary | ICD-10-CM | POA: Insufficient documentation

## 2019-10-17 DIAGNOSIS — M5416 Radiculopathy, lumbar region: Secondary | ICD-10-CM | POA: Diagnosis not present

## 2019-10-17 DIAGNOSIS — M79604 Pain in right leg: Secondary | ICD-10-CM | POA: Insufficient documentation

## 2019-10-17 DIAGNOSIS — M51369 Other intervertebral disc degeneration, lumbar region without mention of lumbar back pain or lower extremity pain: Secondary | ICD-10-CM

## 2019-10-17 DIAGNOSIS — M79605 Pain in left leg: Secondary | ICD-10-CM | POA: Diagnosis not present

## 2019-10-17 DIAGNOSIS — Z5181 Encounter for therapeutic drug level monitoring: Secondary | ICD-10-CM | POA: Diagnosis not present

## 2019-10-17 DIAGNOSIS — M961 Postlaminectomy syndrome, not elsewhere classified: Secondary | ICD-10-CM

## 2019-10-17 DIAGNOSIS — G8928 Other chronic postprocedural pain: Secondary | ICD-10-CM | POA: Insufficient documentation

## 2019-10-17 DIAGNOSIS — Z79891 Long term (current) use of opiate analgesic: Secondary | ICD-10-CM

## 2019-10-17 MED ORDER — MORPHINE SULFATE ER 30 MG PO TBCR: 30.0000 mg | EXTENDED_RELEASE_TABLET | Freq: Two times a day (BID) | ORAL | 0 refills | Status: DC

## 2019-10-17 MED ORDER — OXYCODONE HCL 10 MG PO TABS: 10.0000 mg | ORAL_TABLET | Freq: Four times a day (QID) | ORAL | 0 refills | Status: DC | PRN

## 2019-10-17 NOTE — Progress Notes (Signed)
Subjective:    Patient ID: Samuel [name], male    DOB: March 03, 1976, 44 y.o.   MRN: [id number]  HPI: Samuel [name] is a 44 y.o. male who returns for follow up appointment for chronic pain and medication refill. He states his pain is located in his lower back L>R radiatiing into his bilateral lower extremities. He rates his pain 6. His current exercise regime is walking and performing stretching exercises.   Mr. Veasley Morphine equivalent is 120.00  MME.  Last UDS was Performed on 06/13/2019, it was consistent.    Pain Inventory Average Pain 6 Pain Right Now 6 My pain is constant, sharp, dull, stabbing and aching  In the last 24 hours, has pain interfered with the following? General activity 9 Relation with others 9 Enjoyment of life 9 What TIME of day is your pain at its worst? morning, evening, night Sleep (in general) Fair  Pain is worse with: walking, bending, sitting, inactivity and standing Pain improves with: rest and medication Relief from Meds: 5  Mobility walk without assistance walk with assistance use a cane ability to climb steps?  yes do you drive?  yes Do you have any goals in this area?  no  Function disabled: date disabled . I need assistance with the following:  dressing, bathing, meal prep, household duties and shopping Do you have any goals in this area?  no  Neuro/Psych weakness spasms depression  Prior Studies Any changes since last visit?  no  Physicians involved in your care Any changes since last visit?  no   Family History  Problem Relation Age of Onset  . Hypertension Father    Social History   Socioeconomic History  . Marital status: Married    Spouse name: Not on file  . Number of children: Not on file  . Years of education: Not on file  . Highest education level: Not on file  Occupational History  . Not on file  Tobacco Use  . Smoking status: Never Smoker  . Smokeless tobacco: Never Used  Substance and Sexual  Activity  . Alcohol use: No    Alcohol/week: 0.0 standard drinks  . Drug use: No  . Sexual activity: Not on file  Other Topics Concern  . Not on file  Social History Narrative  . Not on file   Social Determinants of Health   Financial Resource Strain:   . Difficulty of Paying Living Expenses:   Food Insecurity:   . Worried About Charity fundraiser in the Last Year:   . Arboriculturist in the Last Year:   Transportation Needs:   . Film/video editor (Medical):   Marland Kitchen Lack of Transportation (Non-Medical):   Physical Activity:   . Days of Exercise per Week:   . Minutes of Exercise per Session:   Stress:   . Feeling of Stress :   Social Connections:   . Frequency of Communication with Friends and Family:   . Frequency of Social Gatherings with Friends and Family:   . Attends Religious Services:   . Active Member of Clubs or Organizations:   . Attends Archivist Meetings:   Marland Kitchen Marital Status:    Past Surgical History:  Procedure Laterality Date  . BACK SURGERY     lower back surgery x 2   Past Medical History:  Diagnosis Date  . Arthritis    "some in my back"  . GERD (gastroesophageal reflux disease)   . Headache(784.0)  BP 122/76   Pulse 71   Temp 97.7 F (36.5 C)   Ht 6\' 1"  (1.854 m)   Wt (!) 320 lb (145.2 kg)   SpO2 98%   BMI 42.22 kg/m   Opioid Risk Score:   Fall Risk Score:  `1  Depression screen PHQ 2/9  Depression screen [facility] 2/9 06/13/2019 03/13/2019 06/14/2018 04/12/2018 04/05/2018 02/17/2018 02/15/2018  Decreased Interest 1 1 0 0 0 0 1  Down, Depressed, Hopeless 1 1 0 0 0 0 1  PHQ - 2 Score 2 2 0 0 0 0 2  Altered sleeping - - - - - - -  Tired, decreased energy - - - - - - -  Change in appetite - - - - - - -  Feeling bad or failure about yourself  - - - - - - -  Trouble concentrating - - - - - - -  Moving slowly or fidgety/restless - - - - - - -  Suicidal thoughts - - - - - - -  PHQ-9 Score - - - - - - -  Difficult doing work/chores  - - - - - - -    Review of Systems  Constitutional: Negative.   HENT: Negative.   Eyes: Negative.   Respiratory: Negative.   Cardiovascular: Negative.   Gastrointestinal: Negative.   Endocrine: Negative.   Genitourinary: Negative.   Musculoskeletal: Positive for back pain and gait problem.       Spasms   Skin: Negative.   Allergic/Immunologic: Negative.   Neurological: Positive for weakness.  Hematological: Negative.   Psychiatric/Behavioral: Positive for dysphoric mood.  All other systems reviewed and are negative.      Objective:   Physical Exam Vitals and nursing note reviewed.  Constitutional:      Appearance: Normal appearance. He is obese.  Cardiovascular:     Rate and Rhythm: Normal rate and regular rhythm.     Pulses: Normal pulses.     Heart sounds: Normal heart sounds.  Pulmonary:     Effort: Pulmonary effort is normal.     Breath sounds: Normal breath sounds.  Musculoskeletal:     Cervical back: Normal range of motion and neck supple.     Comments: Normal Muscle Bulk and Muscle Testing Reveals:  Upper Extremities: Decreased ROM 90 Degrees and  Muscle Strength 5/5  Thoracic Paraspinal Tenderness: T-7-T-9 Mainly Right Side  Lumbar Hypersensitivity Lower Extremities: Decreased ROM and Muscle Strength 5/5 Arises from Table Slowly Antalgic  Gait   Neurological:     Mental Status: He is alert and oriented to person, place, and time.  Psychiatric:        Mood and Affect: Mood normal.        Behavior: Behavior normal.           Assessment & Plan:  1. 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 He has lumbar degenerative disc disease. Continuecurrent medication regimen withGabapentin, Continue current Medication regimen. S/P Left Lumbar Facet Radiofrequency Ablation with sedation, on 11/12/2019by Dr. Dossie Arbour with good results noted.He is scheduled for RFA with Dr. Lowella Dandy on June 1,2021. 10/17/2019. Refilled: MS  Contin30 mg one tablet every 12 hours #60 and Oxycodone 10mg  one tablet every 6 hours as needed for pain #120. We will continue the opioid monitoring program, this consists of regular clinic visits, examinations, urine drug screen, pill counts as well as use of [place] Controlled Substance reporting System. 2. Sacroiliac Dysfunction: Continue with current medication, heat  and exercise regime.10/17/2019 3. Muscle Spasm:Continue Tizanidineas needed.Continueto Monitor.10/17/2019 4. Chronic Pain Syndrome: Continue MS Contin, Oxycodone andIbuprofen as needed for pain. Continue to Monitor.10/17/2019.  67minutes of face to face patient care time was spent during this visit. All questions were encouraged and answered.  F/U in 1 Month

## 2019-10-24 ENCOUNTER — Encounter: Payer: Self-pay | Admitting: Pain Medicine

## 2019-10-24 ENCOUNTER — Ambulatory Visit (HOSPITAL_BASED_OUTPATIENT_CLINIC_OR_DEPARTMENT_OTHER): Payer: PPO | Admitting: Pain Medicine

## 2019-10-24 ENCOUNTER — Other Ambulatory Visit: Payer: Self-pay

## 2019-10-24 ENCOUNTER — Ambulatory Visit
Admission: RE | Admit: 2019-10-24 | Discharge: 2019-10-24 | Disposition: A | Discharge status: Home / Self Care | Payer: PPO | Source: Ambulatory Visit | Attending: Pain Medicine | Admitting: Pain Medicine

## 2019-10-24 VITALS — BP 81/53 | HR 62 | Temp 98.0°F | Resp 22 | Ht 73.0 in | Wt 320.0 lb

## 2019-10-24 DIAGNOSIS — M5136 Other intervertebral disc degeneration, lumbar region: Secondary | ICD-10-CM

## 2019-10-24 DIAGNOSIS — M961 Postlaminectomy syndrome, not elsewhere classified: Secondary | ICD-10-CM

## 2019-10-24 DIAGNOSIS — M47817 Spondylosis without myelopathy or radiculopathy, lumbosacral region: Secondary | ICD-10-CM

## 2019-10-24 DIAGNOSIS — G8929 Other chronic pain: Secondary | ICD-10-CM | POA: Diagnosis not present

## 2019-10-24 DIAGNOSIS — M47816 Spondylosis without myelopathy or radiculopathy, lumbar region: Secondary | ICD-10-CM | POA: Insufficient documentation

## 2019-10-24 DIAGNOSIS — M5442 Lumbago with sciatica, left side: Secondary | ICD-10-CM | POA: Diagnosis not present

## 2019-10-24 MED ORDER — LACTATED RINGERS IV SOLN
1000.0000 mL | Freq: Once | INTRAVENOUS | Status: AC
  Administered 2019-10-24: 1000 mL via INTRAVENOUS

## 2019-10-24 MED ORDER — FENTANYL CITRATE (PF) 100 MCG/2ML IJ SOLN
25.0000 ug | INTRAMUSCULAR | Status: AC | PRN
  Administered 2019-10-24 (×2): 50 ug via INTRAVENOUS

## 2019-10-24 MED ORDER — MIDAZOLAM HCL 5 MG/5ML IJ SOLN
1.0000 mg | INTRAMUSCULAR | Status: DC | PRN
  Administered 2019-10-24: 1 mg via INTRAVENOUS
  Administered 2019-10-24: 2 mg via INTRAVENOUS
  Administered 2019-10-24: 1 mg via INTRAVENOUS

## 2019-10-24 MED ORDER — ROPIVACAINE HCL 2 MG/ML IJ SOLN
INTRAMUSCULAR | Status: AC
  Filled 2019-10-24: qty 10

## 2019-10-24 MED ORDER — TRIAMCINOLONE ACETONIDE 40 MG/ML IJ SUSP
INTRAMUSCULAR | Status: AC
  Filled 2019-10-24: qty 1

## 2019-10-24 MED ORDER — ROPIVACAINE HCL 2 MG/ML IJ SOLN
9.0000 mL | Freq: Once | INTRAMUSCULAR | Status: AC
  Administered 2019-10-24: 9 mL via PERINEURAL

## 2019-10-24 MED ORDER — MIDAZOLAM HCL 5 MG/5ML IJ SOLN
INTRAMUSCULAR | Status: AC
  Filled 2019-10-24: qty 5

## 2019-10-24 MED ORDER — LIDOCAINE HCL 2 % IJ SOLN
20.0000 mL | Freq: Once | INTRAMUSCULAR | Status: AC
  Administered 2019-10-24: 400 mg

## 2019-10-24 MED ORDER — FENTANYL CITRATE (PF) 100 MCG/2ML IJ SOLN
INTRAMUSCULAR | Status: AC
  Filled 2019-10-24: qty 2

## 2019-10-24 MED ORDER — LIDOCAINE HCL 2 % IJ SOLN
INTRAMUSCULAR | Status: AC
  Filled 2019-10-24: qty 20

## 2019-10-24 MED ORDER — TRIAMCINOLONE ACETONIDE 40 MG/ML IJ SUSP
40.0000 mg | Freq: Once | INTRAMUSCULAR | Status: AC
  Administered 2019-10-24: 40 mg

## 2019-10-24 NOTE — Progress Notes (Signed)
Safety precautions to be maintained throughout the outpatient stay will include: orient to surroundings, keep bed in low position, maintain call bell within reach at all times, provide assistance with transfer out of bed and ambulation.  

## 2019-10-24 NOTE — Patient Instructions (Addendum)
___________________________________________________________________________________________  Post-Radiofrequency (RF) Discharge Instructions  You have just completed a Radiofrequency Neurotomy.  The following instructions will provide you with information and guidelines for self-care upon discharge.  If at any time you have questions or concerns please call your physician. DO NOT DRIVE YOURSELF!!  Instructions:  Apply ice: Fill a plastic sandwich bag with crushed ice. Cover it with a small towel and apply to injection site. Apply for 15 minutes then remove x 15 minutes. Repeat sequence on day of procedure, until you go to bed. The purpose is to minimize swelling and discomfort after procedure.  Apply heat: Apply heat to procedure site starting the day following the procedure. The purpose is to treat any soreness and discomfort from the procedure.  Food intake: No eating limitations, unless stipulated above.  Nevertheless, if you have had sedation, you may experience some nausea.  In this case, it may be wise to wait at least two hours prior to resuming regular diet.  Physical activities: Keep activities to a minimum for the first 8 hours after the procedure. For the first 24 hours after the procedure, do not drive a motor vehicle,  Operate heavy machinery, power tools, or handle any weapons.  Consider walking with the use of an assistive device or accompanied by an adult for the first 24 hours.  Do not drink alcoholic beverages including beer.  Do not make any important decisions or sign any legal documents. Go home and rest today.  Resume activities tomorrow, as tolerated.  Use caution in moving about as you may experience mild leg weakness.  Use caution in cooking, use of household electrical appliances and climbing steps.  Driving: If you have received any sedation, you are not allowed to drive for 24 hours after your procedure.  Blood thinner: Restart your blood thinner 6 hours after your  procedure. (Only for those taking blood thinners)  Insulin: As soon as you can eat, you may resume your normal dosing schedule. (Only for those taking insulin)  Medications: May resume pre-procedure medications.  Do not take any drugs, other than what has been prescribed to you.  Infection prevention: Keep procedure site clean and dry.  Post-procedure Pain Diary: Extremely important that this be done correctly and accurately. Recorded information will be used to determine the next step in treatment.  Pain evaluated is that of treated area only. Do not include pain from an untreated area.  Complete every hour, on the hour, for the initial 8 hours. Set an alarm to help you do this part accurately.  Do not go to sleep and have it completed later. It will not be accurate.  Follow-up appointment: Keep your follow-up appointment after the procedure. Usually 2-6 weeks after radiofrequency. Bring you pain diary. The information collected will be essential for your long-term care.   Expect:  From numbing medicine (AKA: Local Anesthetics): Numbness or decrease in pain.  Onset: Full effect within 15 minutes of injected.  Duration: It will depend on the type of local anesthetic used. On the average, 1 to 8 hours.   From steroids (when added): Decrease in swelling or inflammation. Once inflammation is improved, relief of the pain will follow.  Onset of benefits: Depends on the amount of swelling present. The more swelling, the longer it will take for the benefits to be seen. In some cases, up to 10 days.  Duration: Steroids will stay in the system x 2 weeks. Duration of benefits will depend on multiple posibilities including persistent irritating factors.    From procedure: Some discomfort is to be expected once the numbing medicine wears off. In the case of radiofrequency procedures, this may last as long as 6 weeks. Additional post-procedure pain medication is provided for this. Discomfort is  minimized if ice and heat are applied as instructed.  Call if:  You experience numbness and weakness that gets worse with time, as opposed to wearing off.  He experience any unusual bleeding, difficulty breathing, or loss of the ability to control your bowel and bladder. (This applies to Spinal procedures only)  You experience any redness, swelling, heat, red streaks, elevated temperature, fever, or any other signs of a possible infection.  Emergency Numbers:  [place] hours (Monday - Thursday, 8:00 AM - 4:00 PM) (Friday, 9:00 AM - 12:00 Noon): [phone]  After hours: [phone] ____________________________________________________________________________________________    Intrathecal Pain Pump Information An intrathecal pain pump is a pump that sends medicine into your spinal canal through a thin tube (catheter). You may need an intrathecal pain pump if you have long-term pain that cannot be managed in other ways. In some cases, this pump can be used to deliver medicine that treats long-term muscle spasms (spasticity).  If you are using the pump for only a short time, the pump may be outside your body.  For long-term use, the pump and the catheter are implanted inside your body. The pump is a small round disc and is usually placed under the skin of the abdomen. The catheter will run under your skin from your abdomen to your back. Your health care provider will program the pump to deliver pain medicine into your spinal canal continuously or at certain intervals. You may have a handheld device that allows you to give yourself a dose of pain medicine when needed. The pump runs on a battery. The battery will last 7-10 years, and then the pump will need to be replaced. What are the benefits of an intrathecal pain pump? Opioid pain medicines are strong medicines that are often used to control long-term and severe pain. When these medicines are placed in the fluid space  (intrathecal space) that surrounds your spinal cord and brain, the medicine will block the pain signals that come from your body. This type of pump may be an option if you have long-term pain that cannot be managed with opioids that are taken by mouth or by injection. The pump may allow you to have better control of your pain with less medicine. What are the risks of an intrathecal pain pump? Before you can have the pump placed, you will need to go through a pain control trial. Medicine will be placed into your intrathecal space to see if it controls your pain and to see how well you tolerate the medicine. You will need to have a surgical procedure to place the catheter and the pain pump. This surgery has some risks. There are also risks to long-term use of opioids. Risks of pain pump implantation and use include:  Infection.  Bleeding.  Failure of the pump or the catheter.  Leaking of fluid from your spinal canal (cerebrospinal fluid leak).  A growth around the tip of the catheter inside your spinal canal (granuloma). This can put pressure on your spine.  A need to remove the pump if problems develop.  A need to replace the pump after 7-10 years. Risks of long-term opioid use include:  A breathing problem that prevents you from getting enough oxygen (respiratory depression).  Physical and psychological  dependence.  Constipation or difficulty passing urine.  Nausea and vomiting.  Mental changes, including feeling high, dizzy, drowsy, confused, depressed, or anxious. How do I use the pump? Your health care provider will decide what type of pump is best for you. If you have a pump that gives a dose of medicine on demand, you may have a handheld device to trigger the release of your medicine. You will need to see your health care provider often to make sure the pump is programmed correctly for your pain. This programming may need to be changed over time. You will also need to have the  pump resupplied with medicine. Your health care provider may do this at your routine visits. You should also know that:  Electronic devices or scans will not damage your pump.  You will need a device identification card to pass through security gates.  Your device will have an alarm to warn you of a malfunction or low battery power. The alarm will sound if you need a medicine refill. Contact your health care provider if your alarm sounds. Summary  An intrathecal pain pump is a pump that sends medicine into your spinal canal through a thin tube (catheter).  The pump is a small round disc and is usually placed under the skin of the abdomen.  This type of pump may be an option if you have long-term pain that cannot be managed with opioids that are taken by mouth or by injection.  The pump may allow you to have better control of your pain with less medicine. This information is not intended to replace advice given to you by your health care provider. Make sure you discuss any questions you have with your health care provider. Document Revised: 03/14/2018 Document Reviewed: 03/14/2018 Elsevier Patient Education  2020 [place].   ____________________________________________________________________________________________  Preparing for Procedure with Sedation  Procedure appointments are limited to planned procedures: . No Prescription Refills. . No disability issues will be discussed. . No medication changes will be discussed.  Instructions: . Oral Intake: Do not eat or drink anything for at least 8 hours prior to your procedure. (Exception: Blood Pressure Medication. See below.) . Transportation: Unless otherwise stated by your physician, you may drive yourself after the procedure. . Blood Pressure Medicine: Do not forget to take your blood pressure medicine with a sip of water the morning of the procedure. If your Diastolic (lower reading)is above 100 mmHg, elective cases will be  cancelled/rescheduled. . Blood thinners: These will need to be stopped for procedures. Notify our staff if you are taking any blood thinners. Depending on which one you take, there will be specific instructions on how and when to stop it. . Diabetics on insulin: Notify the staff so that you can be scheduled 1st case in the morning. If your diabetes requires high dose insulin, take only  of your normal insulin dose the morning of the procedure and notify the staff that you have done so. . Preventing infections: Shower with an antibacterial soap the morning of your procedure. . Build-up your immune system: Take 1000 mg of Vitamin C with every meal (3 times a day) the day prior to your procedure. Marland Kitchen Antibiotics: Inform the staff if you have a condition or reason that requires you to take antibiotics before dental procedures. . Pregnancy: If you are pregnant, call and cancel the procedure. . Sickness: If you have a cold, fever, or any active infections, call and cancel the procedure. . Arrival: You must  be in the facility at least 30 minutes prior to your scheduled procedure. . Children: Do not bring children with you. . Dress appropriately: Bring dark clothing that you would not mind if they get stained. . Valuables: Do not bring any jewelry or valuables.  Reasons to call and reschedule or cancel your procedure: (Following these recommendations will minimize the risk of a serious complication.) . Surgeries: Avoid having procedures within 2 weeks of any surgery. (Avoid for 2 weeks before or after any surgery). . Flu Shots: Avoid having procedures within 2 weeks of a flu shots or . (Avoid for 2 weeks before or after immunizations). . Barium: Avoid having a procedure within 7-10 days after having had a radiological study involving the use of radiological contrast. (Myelograms, Barium swallow or enema study). . Heart attacks: Avoid any elective procedures or surgeries for the initial 6 months after a  "Myocardial Infarction" (Heart Attack). . Blood thinners: It is imperative that you stop these medications before procedures. Let us know if you if you take any blood thinner.  . Infection: Avoid procedures during or within two weeks of an infection (including chest colds or gastrointestinal problems). Symptoms associated with infections include: Localized redness, fever, chills, night sweats or profuse sweating, burning sensation when voiding, cough, congestion, stuffiness, runny nose, sore throat, diarrhea, nausea, vomiting, cold or Flu symptoms, recent or current infections. It is specially important if the infection is over the area that we intend to treat. Marland Kitchen Heart and lung problems: Symptoms that may suggest an active cardiopulmonary problem include: cough, chest pain, breathing difficulties or shortness of breath, dizziness, ankle swelling, uncontrolled high or unusually low blood pressure, and/or palpitations. If you are experiencing any of these symptoms, cancel your procedure and contact your primary care physician for an evaluation.  Remember:  Regular Business hours are:  Monday to Thursday 8:00 AM to 4:00 PM  Provider's Schedule: Milinda Pointer, MD:  Procedure days: Tuesday and Thursday 7:30 AM to 4:00 PM  Gillis Santa, MD:  Procedure days: Monday and Wednesday 7:30 AM to 4:00 PM ____________________________________________________________________________________________

## 2019-10-24 NOTE — Progress Notes (Signed)
PROVIDER NOTE: Information contained herein reflects review and annotations entered in association with encounter. Interpretation of such information and data should be left to medically-trained personnel. Information provided to patient can be located elsewhere in the medical record under "Patient Instructions". Document created using STT-dictation technology, any transcriptional errors that may result from process are unintentional.    Patient: Samuel [name]  Service Category: Procedure  Provider: Gaspar Cola, MD  DOB: [birth year]  DOS: 10/24/2019  Location: [facility]  MRN: [id number]  Setting: Ambulatory - outpatient  Referring Provider: No ref. provider found  Type: Established Patient  Specialty: Interventional Pain Management  PCP: System, Pcp Not In   Primary Reason for Visit: Interventional Pain Management Treatment. CC: Back Pain  Procedure:          Anesthesia, Analgesia, Anxiolysis:  Type: Thermal Lumbar Facet, Medial Branch Radiofrequency Ablation/Neurotomy  #3  Primary Purpose: Therapeutic Region: Posterolateral Lumbosacral Spine Level: L2, L3, L4, L5, & S1 Medial Branch Level(s). These levels will denervate the L3-4, L4-5, and the L5-S1 lumbar facet joints. Laterality: Left  Type: Moderate (Conscious) Sedation combined with Local Anesthesia Indication(s): Analgesia and Anxiety Route: Intravenous (IV) IV Access: Secured Sedation: Meaningful verbal contact was maintained at all times during the procedure  Local Anesthetic: Lidocaine 1-2%  Position: Prone   Indications: 1. Lumbar facet syndrome (Bilateral) (R>L)   2. DDD (degenerative disc disease), lumbar   3. 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 Spondylosis without myelopathy or radiculopathy, lumbosacral region   5. Chronic low back pain (Primary Source of Pain) (Bilateral) (R>L)     [name] has been dealing with the above chronic pain for longer than three months and has either failed to respond, was unable to tolerate, or simply did not get enough benefit from other more conservative therapies including, but not limited to: 1. Over-the-counter medications 2. Anti-inflammatory medications 3. Muscle relaxants 4. Membrane stabilizers 5. Opioids 6. Physical therapy and/or chiropractic manipulation 7. Modalities (Heat, ice, etc.) 8. Invasive techniques such as nerve blocks. [name] has attained more than 50% relief of the pain from a series of diagnostic injections conducted in separate occasions.  Pain Score: Pre-procedure: 6 /10 Post-procedure: 0-No pain/10  Note: The patient returns to the clinic today for a palliative radiofrequency ablation.  He comes in today using a cane on his left hand and walking around with his lumbar spine flexed anteriorly.  He is experiencing pain on hyperextension on rotation.  He also refers that the last radiofrequency that we did provided him with significant benefit, like all of the prior ones.  Pre-op Assessment:  [name] is a 44 y.o. (year old), male patient, seen today for interventional treatment. He  has a past surgical history that includes Back surgery. [name] has a current medication list which includes the following prescription(s): gabapentin, ibuprofen, morphine, OVER THE COUNTER MEDICATION, oxycodone hcl, and tizanidine, and the following Facility-Administered Medications: midazolam. His primarily concern today is the Back Pain  Initial Vital Signs:  Pulse/HCG Rate: 98ECG Heart Rate: 74 Temp: 98.4 F (36.9 C) Resp: 20 BP: 125/70 SpO2: 100 %  BMI: Estimated body mass index is 42.22 kg/m as calculated from the following:   Height as of this encounter: 6\' 1"  (1.854 m).   Weight as of this encounter: 320 lb (145.2 kg).  Risk Assessment: Allergies: Reviewed. He has No Known Allergies.  Allergy Precautions: None  required Coagulopathies: Reviewed. None identified.  Blood-thinner therapy: None at this time Active Infection(s): Reviewed. None identified. [name] is afebrile  Site Confirmation: [name] was asked to confirm the procedure and laterality before marking the site Procedure checklist: Completed Consent: Before the procedure and under the influence of no sedative(s), amnesic(s), or anxiolytics, the patient was informed of the treatment options, risks and possible complications. To fulfill our ethical and legal obligations, as recommended by the American Medical Association's Code of Ethics, I have informed the patient of my clinical impression; the nature and purpose of the treatment or procedure; the risks, benefits, and possible complications of the intervention; the alternatives, including doing nothing; the risk(s) and benefit(s) of the alternative treatment(s) or procedure(s); and the risk(s) and benefit(s) of doing nothing. The patient was provided information about the general risks and possible complications associated with the procedure. These may include, but are not limited to: failure to achieve desired goals, infection, bleeding, organ or nerve damage, allergic reactions, paralysis, and death. In addition, the patient was informed of those risks and complications associated to Spine-related procedures, such as failure to decrease pain; infection (i.e.: Meningitis, epidural or intraspinal abscess); bleeding (i.e.: epidural hematoma, subarachnoid hemorrhage, or any other type of intraspinal or peri-dural bleeding); organ or nerve damage (i.e.: Any type of peripheral nerve, nerve root, or spinal cord injury) with subsequent damage to sensory, motor, and/or autonomic systems, resulting in permanent pain, numbness, and/or weakness of one or several areas of the body; allergic reactions; (i.e.: anaphylactic reaction); and/or death. Furthermore, the patient was informed of those risks and  complications associated with the medications. These include, but are not limited to: allergic reactions (i.e.: anaphylactic or anaphylactoid reaction(s)); adrenal axis suppression; blood sugar elevation that in diabetics may result in ketoacidosis or comma; water retention that in patients with history of congestive heart failure may result in shortness of breath, pulmonary edema, and decompensation with resultant heart failure; weight gain; swelling or edema; medication-induced neural toxicity; particulate matter embolism and blood vessel occlusion with resultant organ, and/or nervous system infarction; and/or aseptic necrosis of one or more joints. Finally, the patient was informed that Medicine is not an exact science; therefore, there is also the possibility of unforeseen or unpredictable risks and/or possible complications that may result in a catastrophic outcome. The patient indicated having understood very clearly. We have given the patient no guarantees and we have made no promises. Enough time was given to the patient to ask questions, all of which were answered to the patient's satisfaction. Mr. Kilkenny has indicated that he wanted to continue with the procedure. Attestation: I, the ordering provider, attest that I have discussed with the patient the benefits, risks, side-effects, alternatives, likelihood of achieving goals, and potential problems during recovery for the procedure that I have provided informed consent. Date  Time: 10/24/2019  1:29 PM  Pre-Procedure Preparation:  Monitoring: As per clinic protocol. Respiration, ETCO2, SpO2, BP, heart rate and rhythm monitor placed and checked for adequate function Safety Precautions: Patient was assessed for positional comfort and pressure points before starting the procedure. Time-out: I initiated and conducted the "Time-out" before starting the procedure, as per protocol. The patient was asked to participate by confirming the accuracy of the "Time  Out" information. Verification of the correct person, site, and procedure were performed and confirmed by me, the nursing staff, and the patient. "Time-out" conducted as per Joint Commission's Universal Protocol (UP.01.01.01). Time: 1418  Description of Procedure:  Laterality: Left Levels:  L2, L3, L4, L5, & S1 Medial Branch Level(s), at the L3-4, L4-5, and the L5-S1 lumbar facet joints. Area Prepped: Lumbosacral DuraPrep (Iodine Povacrylex [0.7% available iodine] and Isopropyl Alcohol, 74% w/w) Safety Precautions: Aspiration looking for blood return was conducted prior to all injections. At no point did we inject any substances, as a needle was being advanced. Before injecting, the patient was told to immediately notify me if he was experiencing any new onset of "ringing in the ears, or metallic taste in the mouth". No attempts were made at seeking any paresthesias. Safe injection practices and needle disposal techniques used. Medications properly checked for expiration dates. SDV (single dose vial) medications used. After the completion of the procedure, all disposable equipment used was discarded in the proper designated medical waste containers. Local Anesthesia: Protocol guidelines were followed. The patient was positioned over the fluoroscopy table. The area was prepped in the usual manner. The time-out was completed. The target area was identified using fluoroscopy. A 12-in long, straight, sterile hemostat was used with fluoroscopic guidance to locate the targets for each level blocked. Once located, the skin was marked with an approved surgical skin marker. Once all sites were marked, the skin (epidermis, dermis, and hypodermis), as well as deeper tissues (fat, connective tissue and muscle) were infiltrated with a small amount of a short-acting local anesthetic, loaded on a 10cc syringe with a 25G, 1.5-in  Needle. An appropriate amount of time was allowed for local anesthetics to take effect  before proceeding to the next step. Local Anesthetic: Lidocaine 2.0% The unused portion of the local anesthetic was discarded in the proper designated containers. Technical explanation of process:  Radiofrequency Ablation (RFA) L2 Medial Branch Nerve RFA: The target area for the L2 medial branch is at the junction of the postero-lateral aspect of the superior articular process and the superior, posterior, and medial edge of the transverse process of L3. Under fluoroscopic guidance, a Radiofrequency needle was inserted until contact was made with os over the superior postero-lateral aspect of the pedicular shadow (target area). Sensory and motor testing was conducted to properly adjust the position of the needle. Once satisfactory placement of the needle was achieved, the numbing solution was slowly injected after negative aspiration for blood. 2.0 mL of the nerve block solution was injected without difficulty or complication. After waiting for at least 3 minutes, the ablation was performed. Once completed, the needle was removed intact. L3 Medial Branch Nerve RFA: The target area for the L3 medial branch is at the junction of the postero-lateral aspect of the superior articular process and the superior, posterior, and medial edge of the transverse process of L4. Under fluoroscopic guidance, a Radiofrequency needle was inserted until contact was made with os over the superior postero-lateral aspect of the pedicular shadow (target area). Sensory and motor testing was conducted to properly adjust the position of the needle. Once satisfactory placement of the needle was achieved, the numbing solution was slowly injected after negative aspiration for blood. 2.0 mL of the nerve block solution was injected without difficulty or complication. After waiting for at least 3 minutes, the ablation was performed. Once completed, the needle was removed intact. L4 Medial Branch Nerve RFA: The target area for the L4 medial  branch is at the junction of the postero-lateral aspect of the superior articular process and the superior, posterior, and medial edge of the transverse process of L5. Under fluoroscopic guidance, a Radiofrequency needle was inserted until contact was made  with os over the superior postero-lateral aspect of the pedicular shadow (target area). Sensory and motor testing was conducted to properly adjust the position of the needle. Once satisfactory placement of the needle was achieved, the numbing solution was slowly injected after negative aspiration for blood. 2.0 mL of the nerve block solution was injected without difficulty or complication. After waiting for at least 3 minutes, the ablation was performed. Once completed, the needle was removed intact. L5 Medial Branch Nerve RFA: The target area for the L5 medial branch is at the junction of the postero-lateral aspect of the superior articular process of S1 and the superior, posterior, and medial edge of the sacral ala. Under fluoroscopic guidance, a Radiofrequency needle was inserted until contact was made with os over the superior postero-lateral aspect of the pedicular shadow (target area). Sensory and motor testing was conducted to properly adjust the position of the needle. Once satisfactory placement of the needle was achieved, the numbing solution was slowly injected after negative aspiration for blood. 2.0 mL of the nerve block solution was injected without difficulty or complication. After waiting for at least 3 minutes, the ablation was performed. Once completed, the needle was removed intact. S1 Medial Branch Nerve RFA: The target area for the S1 medial branch is located inferior to the junction of the S1 superior articular process and the L5 inferior articular process, posterior, inferior, and lateral to the 6 o'clock position of the L5-S1 facet joint, just superior to the S1 posterior foramen. Under fluoroscopic guidance, the Radiofrequency needle was  advanced until contact was made with os over the Target area. Sensory and motor testing was conducted to properly adjust the position of the needle. Once satisfactory placement of the needle was achieved, the numbing solution was slowly injected after negative aspiration for blood. 2.0 mL of the nerve block solution was injected without difficulty or complication. After waiting for at least 3 minutes, the ablation was performed. Once completed, the needle was removed intact. Radiofrequency lesioning (ablation):  Radiofrequency Generator: NeuroTherm [id number] Sensory Stimulation Parameters: 50 Hz was used to locate & identify the nerve, making sure that the needle was positioned such that there was no sensory stimulation below 0.3 V or above 0.7 V. Motor Stimulation Parameters: 2 Hz was used to evaluate the motor component. Care was taken not to lesion any nerves that demonstrated motor stimulation of the lower extremities at an output of less than 2.5 times that of the sensory threshold, or a maximum of 2.0 V. Lesioning Technique Parameters: Standard Radiofrequency settings. (Not bipolar or pulsed.) Temperature Settings: 80 degrees C Lesioning time: 60 seconds Intra-operative Compliance: Compliant Materials & Medications: Needle(s) (Electrode/Cannula) Type: Teflon-coated, curved tip, Radiofrequency needle(s) Gauge: 20G Length: 15cm Numbing solution: 0.2% PF-Ropivacaine + Triamcinolone (40 mg/mL) diluted to a final concentration of 4 mg of Triamcinolone/mL of Ropivacaine The unused portion of the solution was discarded in the proper designated containers.  Once the entire procedure was completed, the treated area was cleaned, making sure to leave some of the prepping solution back to take advantage of its long term bactericidal properties.  Illustration of the posterior view of the lumbar spine and the posterior neural structures. Laminae of L2 through S1 are labeled. DPRL5, dorsal primary ramus of L5;  DPRS1, dorsal primary ramus of S1; DPR3, dorsal primary ramus of L3; FJ, facet (zygapophyseal) joint L3-L4; I, inferior articular process of L4; LB1, lateral branch of dorsal primary ramus of L1; IAB, inferior articular branches from L3 medial branch (supplies  L4-L5 facet joint); IBP, intermediate branch plexus; MB3, medial branch of dorsal primary ramus of L3; NR3, third lumbar nerve root; S, superior articular process of L5; SAB, superior articular branches from L4 (supplies L4-5 facet joint also); TP3, transverse process of L3.  Vitals:   10/24/19 1500 10/24/19 1509 10/24/19 1519 10/24/19 1529  BP: 121/76 118/62 107/75 (!) 81/53  Pulse: 62     Resp: 16 15 19  (!) 22  Temp:  97.8 F (36.6 C)  98 F (36.7 C)  TempSrc:  Temporal  Temporal  SpO2: 98% 100% 100% 100%  Weight:      Height:       Start Time: 1418 hrs. End Time:   hrs.  Imaging Guidance (Spinal):          Type of Imaging Technique: Fluoroscopy Guidance (Spinal) Indication(s): Assistance in needle guidance and placement for procedures requiring needle placement in or near specific anatomical locations not easily accessible without such assistance. Exposure Time: Please see nurses notes. Contrast: None used. Fluoroscopic Guidance: I was personally present during the use of fluoroscopy. "Tunnel Vision Technique" used to obtain the best possible view of the target area. Parallax error corrected before commencing the procedure. "Direction-depth-direction" technique used to introduce the needle under continuous pulsed fluoroscopy. Once target was reached, antero-posterior, oblique, and lateral fluoroscopic projection used confirm needle placement in all planes. Images permanently stored in EMR. Interpretation: No contrast injected. I personally interpreted the imaging intraoperatively. Adequate needle placement confirmed in multiple planes. Permanent images saved into the patient's record.  Antibiotic Prophylaxis:   Anti-infectives  (From admission, onward)   None     Indication(s): None identified  Post-operative Assessment:  Post-procedure Vital Signs:  Pulse/HCG Rate: 6266 Temp: 98 F (36.7 C) Resp: (!) 22 BP: (!) 81/53 SpO2: 100 %  EBL: None  Complications: No immediate post-treatment complications observed by team, or reported by patient.  Note: The patient tolerated the entire procedure well. A repeat set of vitals were taken after the procedure and the patient was kept under observation following institutional policy, for this type of procedure. Post-procedural neurological assessment was performed, showing return to baseline, prior to discharge. The patient was provided with post-procedure discharge instructions, including a section on how to identify potential problems. Should any problems arise concerning this procedure, the patient was given instructions to immediately contact us, at any time, without hesitation. In any case, we plan to contact the patient by telephone for a follow-up status report regarding this interventional procedure.  Comments:  No additional relevant information.  Plan of Care  Orders:  Orders Placed This Encounter  Procedures  . Radiofrequency,Lumbar    Scheduling Instructions:     Side(s): Left-sided     Level: L3-4, L4-5, & L5-S1 Facets (L2, L3, L4, L5, & S1 Medial Branch Nerves)     Sedation: Patient's choice.     Timeframe: Today    Order Specific Question:   Where will this procedure be performed?    Answer:   [facility]  . Radiofrequency,Lumbar    Standing Status:   Future    Standing Expiration Date:   10/23/2020    Scheduling Instructions:     Side(s): Right-sided     Level: L3-4, L4-5, & L5-S1 Facets (L2, L3, L4, L5, & S1 Medial Branch Nerves)     Sedation: Patient's choice.     Scheduling Timeframe: 2 weeks from now    Order Specific Question:   Where will this procedure be performed?  Answer:   [facility]  . DG PAIN [facility] C-ARM 1-60  MIN NO REPORT    Intraoperative interpretation by procedural physician at [place].    Standing Status:   Standing    Number of Occurrences:   1    Order Specific Question:   Reason for exam:    Answer:   Assistance in needle guidance and placement for procedures requiring needle placement in or near specific anatomical locations not easily accessible without such assistance.  . Informed Consent Details: Physician/Practitioner Attestation; Transcribe to consent form and obtain patient signature    Nursing Order: Transcribe to consent form and obtain patient signature. Note: Always confirm laterality of pain with Mr. Drzewiecki, before procedure. Procedure: Lumbar Facet Radiofrequency Ablation Indication/Reason: Low Back Pain, with our without leg pain, due to Facet Joint Arthralgia (Joint Pain) known as Lumbar Facet Syndrome, secondary to Lumbar, and/or Lumbosacral Spondylosis (Arthritis of the Spine), without myelopathy or radiculopathy (Nerve Damage). Provider Attestation: I, Malone Dossie Arbour, MD, (Pain Management Specialist), the physician/practitioner, attest that I have discussed with the patient the benefits, risks, side effects, alternatives, likelihood of achieving goals and potential problems during recovery for the procedure that I have provided informed consent.  . Provide equipment / supplies at bedside    Equipment required: Sterile "Radiofrequency Tray"; Large hemostat (1); Small hemostat (1); Towels (6-8); 4x4 sterile sponge pack (1) Radiofrequency Needle(s): Size: Long Quantity: 5    Standing Status:   Standing    Number of Occurrences:   1    Order Specific Question:   Specify    Answer:   Radiofrequency Tray   Chronic Opioid Analgesic:  No opioid analgesics prescribed by our practice.   Medications ordered for procedure: Meds ordered this encounter  Medications  . lidocaine (XYLOCAINE) 2 % (with pres) injection 400 mg  . lactated ringers infusion 1,000 mL  .  midazolam (VERSED) 5 MG/5ML injection 1-2 mg    Make sure Flumazenil is available in the pyxis when using this medication. If oversedation occurs, administer 0.2 mg IV over 15 sec. If after 45 sec no response, administer 0.2 mg again over 1 min; may repeat at 1 min intervals; not to exceed 4 doses (1 mg)  . fentaNYL (SUBLIMAZE) injection 25-50 mcg    Make sure Narcan is available in the pyxis when using this medication. In the event of respiratory depression (RR< 8/min): Titrate NARCAN (naloxone) in increments of 0.1 to 0.2 mg IV at 2-3 minute intervals, until desired degree of reversal.  . ropivacaine (PF) 2 mg/mL (0.2%) (NAROPIN) injection 9 mL  . triamcinolone acetonide (KENALOG-40) injection 40 mg   Medications administered: We administered lidocaine, lactated ringers, midazolam, fentaNYL, ropivacaine (PF) 2 mg/mL (0.2%), and triamcinolone acetonide.  See the medical record for exact dosing, route, and time of administration.  Follow-up plan:   Return in about 2 weeks (around 11/07/2019) for RFA (w/ sedation): (R) L-FCT RFA #3.       Interventional treatment options: Planned, scheduled, and/or pending:   We will be scheduling the patient to return for a left-sided lumbar facet RFA #3 under fluoroscopic guidance and IV sedation.  Once we complete the left side, he wants to have the right side done.  These radiofrequencies were completed around 2019 and they have provided him with over 60% relief of the pain for 13 to 14 months.  We will continue to them for palliative reasons, but I have suggested to the patient to consider the possibility  of an intrathecal pump implant.   Under consideration:   Palliative radiofrequency ablations of the lumbar facets   Therapeutic/palliative (PRN):   Palliative right lumbar facet RFA #3 (last done 02/17/2018) Palliative left lumbar facet RFA #3 (last done 04/05/2018)     Recent Visits Date Type Provider Dept  10/05/19 Telemedicine Milinda Pointer, MD  [facility]  Showing recent visits within past 90 days and meeting all other requirements   Today's Visits Date Type Provider Dept  10/24/19 Procedure visit Milinda Pointer, MD [facility]  Showing today's visits and meeting all other requirements   Future Appointments Date Type Provider Dept  11/16/19 Appointment Milinda Pointer, MD [facility]  Showing future appointments within next 90 days and meeting all other requirements   Disposition: Discharge home  Discharge (Date  Time): 10/24/2019; 1533 hrs.   Primary Care Physician: System, Pcp Not In Location: [facility] Note by: Gaspar Cola, MD Date: 10/24/2019; Time: 4:14 PM  Disclaimer:  Medicine is not an Chief Strategy Officer. The only guarantee in medicine is that nothing is guaranteed. It is important to note that the decision to proceed with this intervention was based on the information collected from the patient. The Data and conclusions were drawn from the patient's questionnaire, the interview, and the physical examination. Because the information was provided in large part by the patient, it cannot be guaranteed that it has not been purposely or unconsciously manipulated. Every effort has been made to obtain as much relevant data as possible for this evaluation. It is important to note that the conclusions that lead to this procedure are derived in large part from the available data. Always take into account that the treatment will also be dependent on availability of resources and existing treatment guidelines, considered by other Pain Management Practitioners as being common knowledge and practice, at the time of the intervention. For Medico-Legal purposes, it is also important to point out that variation in procedural techniques and pharmacological choices are the acceptable norm. The indications, contraindications, technique, and results of the above procedure should  only be interpreted and judged by a Board-Certified Interventional Pain Specialist with extensive familiarity and expertise in the same exact procedure and technique.

## 2019-10-25 ENCOUNTER — Telehealth: Payer: Self-pay | Admitting: *Deleted

## 2019-10-25 NOTE — Telephone Encounter (Signed)
No problems post procedure. 

## 2019-11-14 ENCOUNTER — Encounter: Payer: Self-pay | Admitting: Registered Nurse

## 2019-11-14 ENCOUNTER — Other Ambulatory Visit: Payer: Self-pay

## 2019-11-14 ENCOUNTER — Encounter: Payer: PPO | Attending: Physical Medicine & Rehabilitation | Admitting: Registered Nurse

## 2019-11-14 VITALS — BP 117/74 | HR 62 | Ht 73.0 in | Wt 315.0 lb

## 2019-11-14 DIAGNOSIS — M5136 Other intervertebral disc degeneration, lumbar region: Secondary | ICD-10-CM | POA: Diagnosis not present

## 2019-11-14 DIAGNOSIS — M79604 Pain in right leg: Secondary | ICD-10-CM | POA: Insufficient documentation

## 2019-11-14 DIAGNOSIS — M79605 Pain in left leg: Secondary | ICD-10-CM | POA: Diagnosis not present

## 2019-11-14 DIAGNOSIS — Z5181 Encounter for therapeutic drug level monitoring: Secondary | ICD-10-CM | POA: Insufficient documentation

## 2019-11-14 DIAGNOSIS — Z79891 Long term (current) use of opiate analgesic: Secondary | ICD-10-CM | POA: Diagnosis not present

## 2019-11-14 DIAGNOSIS — Z981 Arthrodesis status: Secondary | ICD-10-CM | POA: Diagnosis not present

## 2019-11-14 DIAGNOSIS — M546 Pain in thoracic spine: Secondary | ICD-10-CM | POA: Diagnosis not present

## 2019-11-14 DIAGNOSIS — M62830 Muscle spasm of back: Secondary | ICD-10-CM

## 2019-11-14 DIAGNOSIS — M51369 Other intervertebral disc degeneration, lumbar region without mention of lumbar back pain or lower extremity pain: Secondary | ICD-10-CM

## 2019-11-14 DIAGNOSIS — M961 Postlaminectomy syndrome, not elsewhere classified: Secondary | ICD-10-CM | POA: Insufficient documentation

## 2019-11-14 DIAGNOSIS — G8928 Other chronic postprocedural pain: Secondary | ICD-10-CM | POA: Insufficient documentation

## 2019-11-14 DIAGNOSIS — M545 Low back pain: Secondary | ICD-10-CM | POA: Insufficient documentation

## 2019-11-14 DIAGNOSIS — Z79899 Other long term (current) drug therapy: Secondary | ICD-10-CM | POA: Diagnosis not present

## 2019-11-14 DIAGNOSIS — M5416 Radiculopathy, lumbar region: Secondary | ICD-10-CM

## 2019-11-14 DIAGNOSIS — G894 Chronic pain syndrome: Secondary | ICD-10-CM | POA: Diagnosis not present

## 2019-11-14 MED ORDER — OXYCODONE HCL 10 MG PO TABS: 10.0000 mg | ORAL_TABLET | Freq: Four times a day (QID) | ORAL | 0 refills | Status: DC | PRN

## 2019-11-14 MED ORDER — MORPHINE SULFATE ER 30 MG PO TBCR: 30.0000 mg | EXTENDED_RELEASE_TABLET | Freq: Two times a day (BID) | ORAL | 0 refills | Status: DC

## 2019-11-14 NOTE — Progress Notes (Signed)
Subjective:    Patient ID: Jordin Vicencio, male    DOB: 1975-09-01, 44 y.o.   MRN: [id number]  HPI: Lawarence Meek is a 44 y.o. male who returns for follow up appointment for chronic pain and medication refill. He states his pain is located in his lower back radiating into his right lower extremity. He rates his pain 6. His current exercise regime is walking and performing stretching exercises.   Mr. Turnley Morphine equivalent is 120.00  MME.  UDS Ordered for Today.    Pain Inventory Average Pain 6 Pain Right Now 6 My pain is constant, sharp, burning, dull and stabbing  In the last 24 hours, has pain interfered with the following? General activity 9 Relation with others 9 Enjoyment of life 9 What TIME of day is your pain at its worst? morning, evening, night Sleep (in general) Fair  Pain is worse with: walking, bending, sitting, inactivity and standing Pain improves with: rest and medication Relief from Meds: 4  Mobility walk with assistance use a cane ability to climb steps?  yes do you drive?  yes  Function disabled: date disabled .  Neuro/Psych weakness trouble walking spasms depression  Prior Studies Any changes since last visit?  no  Physicians involved in your care Any changes since last visit?  no   Family History  Problem Relation Age of Onset  . Hypertension Father    Social History   Socioeconomic History  . Marital status: Married    Spouse name: Not on file  . Number of children: Not on file  . Years of education: Not on file  . Highest education level: Not on file  Occupational History  . Not on file  Tobacco Use  . Smoking status: Never Smoker  . Smokeless tobacco: Never Used  Substance and Sexual Activity  . Alcohol use: No    Alcohol/week: 0.0 standard drinks  . Drug use: No  . Sexual activity: Not on file  Other Topics Concern  . Not on file  Social History Narrative  . Not on file   Social Determinants of Health    Financial Resource Strain:   . Difficulty of Paying Living Expenses:   Food Insecurity:   . Worried About Charity fundraiser in the Last Year:   . Arboriculturist in the Last Year:   Transportation Needs:   . Film/video editor (Medical):   Marland Kitchen Lack of Transportation (Non-Medical):   Physical Activity:   . Days of Exercise per Week:   . Minutes of Exercise per Session:   Stress:   . Feeling of Stress :   Social Connections:   . Frequency of Communication with Friends and Family:   . Frequency of Social Gatherings with Friends and Family:   . Attends Religious Services:   . Active Member of Clubs or Organizations:   . Attends Archivist Meetings:   Marland Kitchen Marital Status:    Past Surgical History:  Procedure Laterality Date  . BACK SURGERY     lower back surgery x 2   Past Medical History:  Diagnosis Date  . Arthritis    "some in my back"  . GERD (gastroesophageal reflux disease)   . Headache(784.0)    BP 117/74   Pulse 62   Ht 6\' 1"  (1.854 m)   Wt (!) 315 lb (142.9 kg)   SpO2 97%   BMI 41.56 kg/m   Opioid Risk Score:   Fall Risk Score:  `  1  Depression screen PHQ 2/9  Depression screen [facility] 2/9 06/13/2019 03/13/2019 06/14/2018 04/12/2018 04/05/2018 02/17/2018 02/15/2018  Decreased Interest 1 1 0 0 0 0 1  Down, Depressed, Hopeless 1 1 0 0 0 0 1  PHQ - 2 Score 2 2 0 0 0 0 2  Altered sleeping - - - - - - -  Tired, decreased energy - - - - - - -  Change in appetite - - - - - - -  Feeling bad or failure about yourself  - - - - - - -  Trouble concentrating - - - - - - -  Moving slowly or fidgety/restless - - - - - - -  Suicidal thoughts - - - - - - -  PHQ-9 Score - - - - - - -  Difficult doing work/chores - - - - - - -    Review of Systems  Constitutional: Negative.   HENT: Negative.   Eyes: Negative.   Respiratory: Negative.   Cardiovascular: Negative.   Gastrointestinal: Negative.   Endocrine: Negative.   Genitourinary: Negative.    Musculoskeletal: Positive for back pain and gait problem.       Spasms  Skin: Negative.   Allergic/Immunologic: Negative.   Neurological: Positive for weakness.  Hematological: Negative.   Psychiatric/Behavioral: Positive for dysphoric mood.  All other systems reviewed and are negative.      Objective:   Physical Exam Vitals and nursing note reviewed.  Constitutional:      Appearance: Normal appearance.  Cardiovascular:     Rate and Rhythm: Normal rate and regular rhythm.     Pulses: Normal pulses.     Heart sounds: Normal heart sounds.  Pulmonary:     Effort: Pulmonary effort is normal.     Breath sounds: Normal breath sounds.  Musculoskeletal:     Cervical back: Normal range of motion and neck supple.     Comments: Normal Muscle Bulk and Muscle Testing Reveals:  Upper Extremities: Full ROM and Muscle Strength 5/5 , Lumbar Hypersensitivity  Lower Extremities: Right Lower Extremity: Decreased ROM and Muscle Strength 5/5 Left Lower Extremity Flexion: Full ROM and Muscle Strength 5/5 Arises from Table Slowly using cane for support Antalgic Gait   Skin:    General: Skin is warm and dry.  Neurological:     Mental Status: He is alert and oriented to person, place, and time.  Psychiatric:        Mood and Affect: Mood normal.        Behavior: Behavior normal.           Assessment & Plan:  1. 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 He has lumbar degenerative disc disease. Continuecurrent medication regimen withGabapentin, Continue current Medication regimen. S/P Left Lumbar Facet Radiofrequency Ablation with sedation, on 06/01/2021by Dr. Dossie Arbour with good results noted.He is scheduled for Right  RFA with Dr. Lowella Dandy on June 24,2021. 11/14/2019. Refilled: MS Contin30 mg one tablet every 12 hours #60 and Oxycodone 10mg  one tablet every 6 hours as needed for pain #120. We will continue the opioid monitoring program, this consists of  regular clinic visits, examinations, urine drug screen, pill counts as well as use of [place] Controlled Substance reporting System. 2. Sacroiliac Dysfunction: Continue with current medication, heat and exercise regime.11/14/2019 3. Muscle Spasm:Continue Tizanidineas needed.Continueto Monitor.11/14/2019 4. Chronic Pain Syndrome: Continue MS Contin, Oxycodone andIbuprofen as needed for pain. Continue to Monitor.10/17/2019.  72minutes of face to face patient care  time was spent during this visit. All questions were encouraged and answered.  F/U in 1 Month

## 2019-11-16 ENCOUNTER — Ambulatory Visit
Admission: RE | Admit: 2019-11-16 | Discharge: 2019-11-16 | Disposition: A | Discharge status: Home / Self Care | Payer: PPO | Source: Ambulatory Visit | Attending: Pain Medicine | Admitting: Pain Medicine

## 2019-11-16 ENCOUNTER — Other Ambulatory Visit: Payer: Self-pay

## 2019-11-16 ENCOUNTER — Ambulatory Visit (HOSPITAL_BASED_OUTPATIENT_CLINIC_OR_DEPARTMENT_OTHER): Payer: PPO | Admitting: Pain Medicine

## 2019-11-16 ENCOUNTER — Encounter: Payer: Self-pay | Admitting: Pain Medicine

## 2019-11-16 VITALS — BP 122/68 | HR 66 | Temp 98.3°F | Resp 17 | Ht 73.0 in | Wt 315.0 lb

## 2019-11-16 DIAGNOSIS — M47816 Spondylosis without myelopathy or radiculopathy, lumbar region: Secondary | ICD-10-CM | POA: Insufficient documentation

## 2019-11-16 DIAGNOSIS — M47817 Spondylosis without myelopathy or radiculopathy, lumbosacral region: Secondary | ICD-10-CM

## 2019-11-16 DIAGNOSIS — M5442 Lumbago with sciatica, left side: Secondary | ICD-10-CM

## 2019-11-16 DIAGNOSIS — M5136 Other intervertebral disc degeneration, lumbar region: Secondary | ICD-10-CM | POA: Insufficient documentation

## 2019-11-16 DIAGNOSIS — F1120 Opioid dependence, uncomplicated: Secondary | ICD-10-CM | POA: Diagnosis not present

## 2019-11-16 DIAGNOSIS — G8929 Other chronic pain: Secondary | ICD-10-CM | POA: Diagnosis not present

## 2019-11-16 MED ORDER — LACTATED RINGERS IV SOLN
1000.0000 mL | Freq: Once | INTRAVENOUS | Status: AC
  Administered 2019-11-16: 1000 mL via INTRAVENOUS

## 2019-11-16 MED ORDER — LIDOCAINE HCL 2 % IJ SOLN
20.0000 mL | Freq: Once | INTRAMUSCULAR | Status: AC
  Administered 2019-11-16: 400 mg

## 2019-11-16 MED ORDER — FENTANYL CITRATE (PF) 100 MCG/2ML IJ SOLN
25.0000 ug | INTRAMUSCULAR | Status: AC | PRN
  Administered 2019-11-16 (×2): 50 ug via INTRAVENOUS
  Filled 2019-11-16: qty 2

## 2019-11-16 MED ORDER — TRIAMCINOLONE ACETONIDE 40 MG/ML IJ SUSP
40.0000 mg | Freq: Once | INTRAMUSCULAR | Status: AC
  Administered 2019-11-16: 40 mg
  Filled 2019-11-16: qty 1

## 2019-11-16 MED ORDER — MIDAZOLAM HCL 5 MG/5ML IJ SOLN
1.0000 mg | INTRAMUSCULAR | Status: DC | PRN
  Administered 2019-11-16 (×3): 1 mg via INTRAVENOUS
  Filled 2019-11-16: qty 5

## 2019-11-16 MED ORDER — ROPIVACAINE HCL 2 MG/ML IJ SOLN
9.0000 mL | Freq: Once | INTRAMUSCULAR | Status: AC
  Administered 2019-11-16: 9 mL via PERINEURAL
  Filled 2019-11-16: qty 10

## 2019-11-16 NOTE — Progress Notes (Signed)
PROVIDER NOTE: Information contained herein reflects review and annotations entered in association with encounter. Interpretation of such information and data should be left to medically-trained personnel. Information provided to patient can be located elsewhere in the medical record under "Patient Instructions". Document created using STT-dictation technology, any transcriptional errors that may result from process are unintentional.    Patient: Samuel [name]  Service Category: Procedure  Provider: Gaspar Cola, MD  DOB: 1976-03-24  DOS: 11/16/2019  Location: [facility]  MRN: [id number]  Setting: Ambulatory - outpatient  Referring Provider: No ref. provider found  Type: Established Patient  Specialty: Interventional Pain Management  PCP: System, Pcp Not In   Primary Reason for Visit: Interventional Pain Management Treatment. CC: Back Pain  Procedure:          Anesthesia, Analgesia, Anxiolysis:  Type: Thermal Lumbar Facet, Medial Branch Radiofrequency Ablation/Neurotomy  #3  Primary Purpose: Palliative Region: Posterolateral Lumbosacral Spine Level: L2, L3, L4, L5, & S1 Medial Branch Level(s). These levels will denervate the L3-4, L4-5, and the L5-S1 lumbar facet joints. Laterality: Right  Type: Moderate (Conscious) Sedation combined with Local Anesthesia Indication(s): Analgesia and Anxiety Route: Intravenous (IV) IV Access: Secured Sedation: Meaningful verbal contact was maintained at all times during the procedure  Local Anesthetic: Lidocaine 1-2%  Position: Prone   Indications: 1. Lumbar facet syndrome (Bilateral) (R>L)   2. DDD (degenerative disc disease), lumbar   3. Spondylosis without myelopathy or radiculopathy, lumbosacral region   4. Lumbar spondylosis   5. Uncomplicated opioid dependence ([place])   6. Chronic low back pain (Primary Source of Pain) (Bilateral) (R>L)    Samuel [name] has been dealing with the above chronic pain for longer than three  months and has either failed to respond, was unable to tolerate, or simply did not get enough benefit from other more conservative therapies including, but not limited to: 1. Over-the-counter medications 2. Anti-inflammatory medications 3. Muscle relaxants 4. Membrane stabilizers 5. Opioids 6. Physical therapy and/or chiropractic manipulation 7. Modalities (Heat, ice, etc.) 8. Invasive techniques such as nerve blocks. Samuel [name] has attained more than 50% relief of the pain from a series of diagnostic injections conducted in separate occasions.  Pain Score: Pre-procedure: 6 /10 Post-procedure: 3 /10  Note: On the last visit we spoke to this patient about the possibility of an intrathecal pump trial.  I have offered him the possibility of doing an intrathecal pump implant of a [employer] programmable intrathecal pump to deliver medicine on a continuous basis.  With this device we could deliver intrathecal bupivacaine so as to minimize his opioid requirements.  Today we will be providing him with some written information about the device.  Pre-op Assessment:  Samuel [name] is a 44 y.o. (year old), male patient, seen today for interventional treatment. He  has a past surgical history that includes Back surgery. Samuel [name] has a current medication list which includes the following prescription(s): gabapentin, ibuprofen, morphine, OVER THE COUNTER MEDICATION, oxycodone hcl, and tizanidine, and the following Facility-Administered Medications: midazolam. His primarily concern today is the Back Pain  Initial Vital Signs:  Pulse/HCG Rate: 66ECG Heart Rate: 71 Temp: 98 F (36.7 C) Resp: 20 BP: (!) 116/43 SpO2: 100 %  BMI: Estimated body mass index is 41.56 kg/m as calculated from the following:   Height as of this encounter: 6\' 1"  (1.854 m).   Weight as of this encounter: 315 lb (142.9 kg).  Risk Assessment: Allergies: Reviewed. He has No Known Allergies.  Allergy Precautions:  None required  Coagulopathies: Reviewed. None identified.  Blood-thinner therapy: None at this time Active Infection(s): Reviewed. None identified. Samuel [name] is afebrile  Site Confirmation: Samuel [name] was asked to confirm the procedure and laterality before marking the site Procedure checklist: Completed Consent: Before the procedure and under the influence of no sedative(s), amnesic(s), or anxiolytics, the patient was informed of the treatment options, risks and possible complications. To fulfill our ethical and legal obligations, as recommended by the American Medical Association's Code of Ethics, I have informed the patient of my clinical impression; the nature and purpose of the treatment or procedure; the risks, benefits, and possible complications of the intervention; the alternatives, including doing nothing; the risk(s) and benefit(s) of the alternative treatment(s) or procedure(s); and the risk(s) and benefit(s) of doing nothing. The patient was provided information about the general risks and possible complications associated with the procedure. These may include, but are not limited to: failure to achieve desired goals, infection, bleeding, organ or nerve damage, allergic reactions, paralysis, and death. In addition, the patient was informed of those risks and complications associated to Spine-related procedures, such as failure to decrease pain; infection (i.e.: Meningitis, epidural or intraspinal abscess); bleeding (i.e.: epidural hematoma, subarachnoid hemorrhage, or any other type of intraspinal or peri-dural bleeding); organ or nerve damage (i.e.: Any type of peripheral nerve, nerve root, or spinal cord injury) with subsequent damage to sensory, motor, and/or autonomic systems, resulting in permanent pain, numbness, and/or weakness of one or several areas of the body; allergic reactions; (i.e.: anaphylactic reaction); and/or death. Furthermore, the patient was informed of those risks and complications  associated with the medications. These include, but are not limited to: allergic reactions (i.e.: anaphylactic or anaphylactoid reaction(s)); adrenal axis suppression; blood sugar elevation that in diabetics may result in ketoacidosis or comma; water retention that in patients with history of congestive heart failure may result in shortness of breath, pulmonary edema, and decompensation with resultant heart failure; weight gain; swelling or edema; medication-induced neural toxicity; particulate matter embolism and blood vessel occlusion with resultant organ, and/or nervous system infarction; and/or aseptic necrosis of one or more joints. Finally, the patient was informed that Medicine is not an exact science; therefore, there is also the possibility of unforeseen or unpredictable risks and/or possible complications that may result in a catastrophic outcome. The patient indicated having understood very clearly. We have given the patient no guarantees and we have made no promises. Enough time was given to the patient to ask questions, all of which were answered to the patient's satisfaction. Samuel [name] has indicated that he wanted to continue with the procedure. Attestation: I, the ordering provider, attest that I have discussed with the patient the benefits, risks, side-effects, alternatives, likelihood of achieving goals, and potential problems during recovery for the procedure that I have provided informed consent. Date  Time: 11/16/2019 10:10 AM  Pre-Procedure Preparation:  Monitoring: As per clinic protocol. Respiration, ETCO2, SpO2, BP, heart rate and rhythm monitor placed and checked for adequate function Safety Precautions: Patient was assessed for positional comfort and pressure points before starting the procedure. Time-out: I initiated and conducted the "Time-out" before starting the procedure, as per protocol. The patient was asked to participate by confirming the accuracy of the "Time Out"  information. Verification of the correct person, site, and procedure were performed and confirmed by me, the nursing staff, and the patient. "Time-out" conducted as per Joint Commission's Universal Protocol (UP.01.01.01). Time: 1047  Description of Procedure:  Laterality: Right Levels:  L2, L3, L4, L5, & S1 Medial Branch Level(s), at the L3-4, L4-5, and the L5-S1 lumbar facet joints. Area Prepped: Lumbosacral DuraPrep (Iodine Povacrylex [0.7% available iodine] and Isopropyl Alcohol, 74% w/w) Safety Precautions: Aspiration looking for blood return was conducted prior to all injections. At no point did we inject any substances, as a needle was being advanced. Before injecting, the patient was told to immediately notify me if he was experiencing any new onset of "ringing in the ears, or metallic taste in the mouth". No attempts were made at seeking any paresthesias. Safe injection practices and needle disposal techniques used. Medications properly checked for expiration dates. SDV (single dose vial) medications used. After the completion of the procedure, all disposable equipment used was discarded in the proper designated medical waste containers. Local Anesthesia: Protocol guidelines were followed. The patient was positioned over the fluoroscopy table. The area was prepped in the usual manner. The time-out was completed. The target area was identified using fluoroscopy. A 12-in long, straight, sterile hemostat was used with fluoroscopic guidance to locate the targets for each level blocked. Once located, the skin was marked with an approved surgical skin marker. Once all sites were marked, the skin (epidermis, dermis, and hypodermis), as well as deeper tissues (fat, connective tissue and muscle) were infiltrated with a small amount of a short-acting local anesthetic, loaded on a 10cc syringe with a 25G, 1.5-in  Needle. An appropriate amount of time was allowed for local anesthetics to take effect  before proceeding to the next step. Local Anesthetic: Lidocaine 2.0% The unused portion of the local anesthetic was discarded in the proper designated containers. Technical explanation of process:  Radiofrequency Ablation (RFA) L2 Medial Branch Nerve RFA: The target area for the L2 medial branch is at the junction of the postero-lateral aspect of the superior articular process and the superior, posterior, and medial edge of the transverse process of L3. Under fluoroscopic guidance, a Radiofrequency needle was inserted until contact was made with os over the superior postero-lateral aspect of the pedicular shadow (target area). Sensory and motor testing was conducted to properly adjust the position of the needle. Once satisfactory placement of the needle was achieved, the numbing solution was slowly injected after negative aspiration for blood. 2.0 mL of the nerve block solution was injected without difficulty or complication. After waiting for at least 3 minutes, the ablation was performed. Once completed, the needle was removed intact. L3 Medial Branch Nerve RFA: The target area for the L3 medial branch is at the junction of the postero-lateral aspect of the superior articular process and the superior, posterior, and medial edge of the transverse process of L4. Under fluoroscopic guidance, a Radiofrequency needle was inserted until contact was made with os over the superior postero-lateral aspect of the pedicular shadow (target area). Sensory and motor testing was conducted to properly adjust the position of the needle. Once satisfactory placement of the needle was achieved, the numbing solution was slowly injected after negative aspiration for blood. 2.0 mL of the nerve block solution was injected without difficulty or complication. After waiting for at least 3 minutes, the ablation was performed. Once completed, the needle was removed intact. L4 Medial Branch Nerve RFA: The target area for the L4 medial  branch is at the junction of the postero-lateral aspect of the superior articular process and the superior, posterior, and medial edge of the transverse process of L5. Under fluoroscopic guidance, a Radiofrequency needle was inserted until contact was made  with os over the superior postero-lateral aspect of the pedicular shadow (target area). Sensory and motor testing was conducted to properly adjust the position of the needle. Once satisfactory placement of the needle was achieved, the numbing solution was slowly injected after negative aspiration for blood. 2.0 mL of the nerve block solution was injected without difficulty or complication. After waiting for at least 3 minutes, the ablation was performed. Once completed, the needle was removed intact. L5 Medial Branch Nerve RFA: The target area for the L5 medial branch is at the junction of the postero-lateral aspect of the superior articular process of S1 and the superior, posterior, and medial edge of the sacral ala. Under fluoroscopic guidance, a Radiofrequency needle was inserted until contact was made with os over the superior postero-lateral aspect of the pedicular shadow (target area). Sensory and motor testing was conducted to properly adjust the position of the needle. Once satisfactory placement of the needle was achieved, the numbing solution was slowly injected after negative aspiration for blood. 2.0 mL of the nerve block solution was injected without difficulty or complication. After waiting for at least 3 minutes, the ablation was performed. Once completed, the needle was removed intact. S1 Medial Branch Nerve RFA: The target area for the S1 medial branch is located inferior to the junction of the S1 superior articular process and the L5 inferior articular process, posterior, inferior, and lateral to the 6 o'clock position of the L5-S1 facet joint, just superior to the S1 posterior foramen. Under fluoroscopic guidance, the Radiofrequency needle was  advanced until contact was made with os over the Target area. Sensory and motor testing was conducted to properly adjust the position of the needle. Once satisfactory placement of the needle was achieved, the numbing solution was slowly injected after negative aspiration for blood. 2.0 mL of the nerve block solution was injected without difficulty or complication. After waiting for at least 3 minutes, the ablation was performed. Once completed, the needle was removed intact. Radiofrequency lesioning (ablation):  Radiofrequency Generator: NeuroTherm [id number] Sensory Stimulation Parameters: 50 Hz was used to locate & identify the nerve, making sure that the needle was positioned such that there was no sensory stimulation below 0.3 V or above 0.7 V. Motor Stimulation Parameters: 2 Hz was used to evaluate the motor component. Care was taken not to lesion any nerves that demonstrated motor stimulation of the lower extremities at an output of less than 2.5 times that of the sensory threshold, or a maximum of 2.0 V. Lesioning Technique Parameters: Standard Radiofrequency settings. (Not bipolar or pulsed.) Temperature Settings: 80 degrees C Lesioning time: 60 seconds Intra-operative Compliance: Compliant Materials & Medications: Needle(s) (Electrode/Cannula) Type: Teflon-coated, curved tip, Radiofrequency needle(s) Gauge: 20G Length: 15cm Numbing solution: 0.2% PF-Ropivacaine + Triamcinolone (40 mg/mL) diluted to a final concentration of 4 mg of Triamcinolone/mL of Ropivacaine The unused portion of the solution was discarded in the proper designated containers.  Once the entire procedure was completed, the treated area was cleaned, making sure to leave some of the prepping solution back to take advantage of its long term bactericidal properties.  Illustration of the posterior view of the lumbar spine and the posterior neural structures. Laminae of L2 through S1 are labeled. DPRL5, dorsal primary ramus of L5;  DPRS1, dorsal primary ramus of S1; DPR3, dorsal primary ramus of L3; FJ, facet (zygapophyseal) joint L3-L4; I, inferior articular process of L4; LB1, lateral branch of dorsal primary ramus of L1; IAB, inferior articular branches from L3 medial branch (supplies  L4-L5 facet joint); IBP, intermediate branch plexus; MB3, medial branch of dorsal primary ramus of L3; NR3, third lumbar nerve root; S, superior articular process of L5; SAB, superior articular branches from L4 (supplies L4-5 facet joint also); TP3, transverse process of L3.  Vitals:   11/16/19 1135 11/16/19 1141 11/16/19 1151 11/16/19 1201  BP: 140/78 (!) 138/57 133/60 122/68  Pulse: 66     Resp: (!) 22 16 14 17   Temp:  (!) 97.5 F (36.4 C)  98.3 F (36.8 C)  TempSrc:  Temporal  Temporal  SpO2: 98% 98% 100% 100%  Weight:      Height:       Start Time: 1047 hrs. End Time: 1134 hrs.  Imaging Guidance (Spinal):          Type of Imaging Technique: Fluoroscopy Guidance (Spinal) Indication(s): Assistance in needle guidance and placement for procedures requiring needle placement in or near specific anatomical locations not easily accessible without such assistance. Exposure Time: Please see nurses notes. Contrast: None used. Fluoroscopic Guidance: I was personally present during the use of fluoroscopy. "Tunnel Vision Technique" used to obtain the best possible view of the target area. Parallax error corrected before commencing the procedure. "Direction-depth-direction" technique used to introduce the needle under continuous pulsed fluoroscopy. Once target was reached, antero-posterior, oblique, and lateral fluoroscopic projection used confirm needle placement in all planes. Images permanently stored in EMR. Interpretation: No contrast injected. I personally interpreted the imaging intraoperatively. Adequate needle placement confirmed in multiple planes. Permanent images saved into the patient's record.  Antibiotic Prophylaxis:    Anti-infectives (From admission, onward)   None     Indication(s): None identified  Post-operative Assessment:  Post-procedure Vital Signs:  Pulse/HCG Rate: 6672 Temp: 98.3 F (36.8 C) Resp: 17 BP: 122/68 SpO2: 100 %  EBL: None  Complications: No immediate post-treatment complications observed by team, or reported by patient.  Note: The patient tolerated the entire procedure well. A repeat set of vitals were taken after the procedure and the patient was kept under observation following institutional policy, for this type of procedure. Post-procedural neurological assessment was performed, showing return to baseline, prior to discharge. The patient was provided with post-procedure discharge instructions, including a section on how to identify potential problems. Should any problems arise concerning this procedure, the patient was given instructions to immediately contact us, at any time, without hesitation. In any case, we plan to contact the patient by telephone for a follow-up status report regarding this interventional procedure.  Comments:  Capturing adequate stimulation is becoming increasingly harder.  I am not sure that he would be a candidate for any further radiofrequency ablations, at this rate.  Plan of Care  Orders:  Orders Placed This Encounter  Procedures  . Radiofrequency,Lumbar    Scheduling Instructions:     Side(s): Right-sided     Level: L3-4, L4-5, & L5-S1 Facets (L2, L3, L4, L5, & S1 Medial Branch Nerves)     Sedation: Patient's choice.     Timeframe: Today    Order Specific Question:   Where will this procedure be performed?    Answer:   [facility]  . [facility] C-ARM 1-60 MIN NO REPORT    Intraoperative interpretation by procedural physician at [place].    Standing Status:   Standing    Number of Occurrences:   1    Order Specific Question:   Reason for exam:    Answer:   Assistance in needle guidance and placement  for  procedures requiring needle placement in or near specific anatomical locations not easily accessible without such assistance.  . Informed Consent Details: Physician/Practitioner Attestation; Transcribe to consent form and obtain patient signature    Nursing Order: Transcribe to consent form and obtain patient signature. Note: Always confirm laterality of pain with Samuel [name], before procedure. Procedure: Lumbar Facet Radiofrequency Ablation Indication/Reason: Low Back Pain, with our without leg pain, due to Facet Joint Arthralgia (Joint Pain) known as Lumbar Facet Syndrome, secondary to Lumbar, and/or Lumbosacral Spondylosis (Arthritis of the Spine), without myelopathy or radiculopathy (Nerve Damage). Provider Attestation: I, [place] Dossie Arbour, MD, (Pain Management Specialist), the physician/practitioner, attest that I have discussed with the patient the benefits, risks, side effects, alternatives, likelihood of achieving goals and potential problems during recovery for the procedure that I have provided informed consent.  . Provide equipment / supplies at bedside    Equipment required: Sterile "Radiofrequency Tray"; Large hemostat (1); Small hemostat (1); Towels (6-8); 4x4 sterile sponge pack (1) Radiofrequency Needle(s): Size: Long Quantity: 5    Standing Status:   Standing    Number of Occurrences:   1    Order Specific Question:   Specify    Answer:   Radiofrequency Tray   Chronic Opioid Analgesic:  No opioid analgesics prescribed by our practice.   Medications ordered for procedure: Meds ordered this encounter  Medications  . lidocaine (XYLOCAINE) 2 % (with pres) injection 400 mg  . lactated ringers infusion 1,000 mL  . midazolam (VERSED) 5 MG/5ML injection 1-2 mg    Make sure Flumazenil is available in the pyxis when using this medication. If oversedation occurs, administer 0.2 mg IV over 15 sec. If after 45 sec no response, administer 0.2 mg again over 1 min; may repeat at 1 min  intervals; not to exceed 4 doses (1 mg)  . fentaNYL (SUBLIMAZE) injection 25-50 mcg    Make sure Narcan is available in the pyxis when using this medication. In the event of respiratory depression (RR< 8/min): Titrate NARCAN (naloxone) in increments of 0.1 to 0.2 mg IV at 2-3 minute intervals, until desired degree of reversal.  . ropivacaine (PF) 2 mg/mL (0.2%) (NAROPIN) injection 9 mL  . triamcinolone acetonide (KENALOG-40) injection 40 mg   Medications administered: We administered lidocaine, lactated ringers, midazolam, fentaNYL, ropivacaine (PF) 2 mg/mL (0.2%), and triamcinolone acetonide.  See the medical record for exact dosing, route, and time of administration.  Follow-up plan:   Return in about 6 weeks (around 12/28/2019) for (VV), (PP).       Interventional treatment options: Planned, scheduled, and/or pending:   We will be scheduling the patient to return for a left-sided lumbar facet RFA #3 under fluoroscopic guidance and IV sedation.  Once we complete the left side, he wants to have the right side done.  These radiofrequencies were completed around 2019 and they have provided him with over 60% relief of the pain for 13 to 14 months.  We will continue to them for palliative reasons, but I have suggested to the patient to consider the possibility of an intrathecal pump implant.   Under consideration:   Palliative radiofrequency ablations of the lumbar facets   Therapeutic/palliative (PRN):   Palliative right lumbar facet RFA #3 (last done 02/17/2018) Palliative left lumbar facet RFA #3 (last done 04/05/2018)      Recent Visits Date Type Provider Dept  10/24/19 Procedure visit Milinda Pointer, MD [facility]  10/05/19 Telemedicine Milinda Pointer, MD [facility]  Showing recent visits within past 90 days and meeting all other requirements Today's Visits Date Type Provider Dept  11/16/19 Procedure visit Milinda Pointer, MD [facility]   Showing today's visits and meeting all other requirements Future Appointments Date Type Provider Dept  12/28/19 Appointment Milinda Pointer, MD [facility]  Showing future appointments within next 90 days and meeting all other requirements  Disposition: Discharge home  Discharge (Date  Time): 11/16/2019; 1206 hrs.   Primary Care Physician: System, Pcp Not In Location: [facility] Note by: Gaspar Cola, MD Date: 11/16/2019; Time: 12:42 PM  Disclaimer:  Medicine is not an Chief Strategy Officer. The only guarantee in medicine is that nothing is guaranteed. It is important to note that the decision to proceed with this intervention was based on the information collected from the patient. The Data and conclusions were drawn from the patient's questionnaire, the interview, and the physical examination. Because the information was provided in large part by the patient, it cannot be guaranteed that it has not been purposely or unconsciously manipulated. Every effort has been made to obtain as much relevant data as possible for this evaluation. It is important to note that the conclusions that lead to this procedure are derived in large part from the available data. Always take into account that the treatment will also be dependent on availability of resources and existing treatment guidelines, considered by other Pain Management Practitioners as being common knowledge and practice, at the time of the intervention. For Medico-Legal purposes, it is also important to point out that variation in procedural techniques and pharmacological choices are the acceptable norm. The indications, contraindications, technique, and results of the above procedure should only be interpreted and judged by a Board-Certified Interventional Pain Specialist with extensive familiarity and expertise in the same exact procedure and technique.

## 2019-11-16 NOTE — Patient Instructions (Signed)

## 2019-11-16 NOTE — Progress Notes (Signed)
Safety precautions to be maintained throughout the outpatient stay will include: orient to surroundings, keep bed in low position, maintain call bell within reach at all times, provide assistance with transfer out of bed and ambulation.  

## 2019-11-17 ENCOUNTER — Telehealth: Payer: Self-pay | Admitting: *Deleted

## 2019-11-17 LAB — TOXASSURE SELECT,+ANTIDEPR,UR

## 2019-11-17 NOTE — Telephone Encounter (Signed)
Spoke with patient re; procedure on yesterday.  No questions or concerns.  

## 2019-11-20 ENCOUNTER — Telehealth: Payer: Self-pay | Admitting: *Deleted

## 2019-11-20 NOTE — Telephone Encounter (Signed)
Urine drug screen for this encounter is consistent for prescribed medications.   

## 2019-12-04 NOTE — Progress Notes (Signed)
Patient: Samuel [name]  Service Category: E/M  Provider: Gaspar Cola, MD  DOB: 10-Nov-1975  DOS: 12/05/2019  Location: Office  MRN: [id number]  Setting: Ambulatory outpatient  Referring Provider: No ref. provider found  Type: Established Patient  Specialty: Interventional Pain Management  PCP: System, Pcp Not In  Location: Remote location  Delivery: TeleHealth     Virtual Encounter - Pain Management PROVIDER NOTE: Information contained herein reflects review and annotations entered in association with encounter. Interpretation of such information and data should be left to medically-trained personnel. Information provided to patient can be located elsewhere in the medical record under "Patient Instructions". Document created using STT-dictation technology, any transcriptional errors that may result from process are unintentional.    Contact & Pharmacy Preferred: [phone] Home: [phone] (home) Mobile: [phone] (mobile) E-mail: [facility] [address] [place] [address] Phone: [phone] Fax: [phone]   Pre-screening  Samuel [name] offered "in-person" vs "virtual" encounter. He indicated preferring virtual for this encounter.   Reason COVID-19*  Social distancing based on CDC and AMA recommendations.   I contacted Samuel [name] on 12/05/2019 via telephone.      I clearly identified myself as Gaspar Cola, MD. I verified that I was speaking with the correct person using two identifiers (Name: Samuel [name], and date of birth: [birth year]).  Consent I sought verbal advanced consent from Samuel [name] for virtual visit interactions. I informed Samuel [name] of possible security and privacy concerns, risks, and limitations associated with providing "not-in-person" medical evaluation and management services. I also informed Samuel [name] of the availability of "in-person" appointments.  Finally, I informed him that there would be a charge for the virtual visit and that he could be  personally, fully or partially, financially responsible for it. Samuel [name] expressed understanding and agreed to proceed.   Historic Elements   Samuel [name] is a 44 y.o. year [name], Samuel [name] after his last contact with our practice on 11/17/2019. Samuel [name]  has a [name] medical history of Arthritis, GERD (gastroesophageal reflux disease), and Headache(784.0). He also  has a [name] surgical history that includes Back surgery. Samuel [name] has a current medication [name] which includes the following prescription(s): gabapentin, ibuprofen, morphine, OVER THE COUNTER MEDICATION, oxycodone hcl, and tizanidine. He  reports that he has never smoked. He has never used smokeless tobacco. He reports that he does not drink alcohol and does not use drugs. Samuel [name] has No Known Allergies.   HPI  [name], he is being contacted for a post-procedure assessment. According to the patient he has attained about 30% improvement on the right side and 30 to 40% improvement on the left side. He continues to have pain going down the legs and this pain runs down the front of his thigh to the level of the knee. According to the patient, the pain does not reach the knee on either side. However in the case of the left leg it seems to be a lot worse than the right. He is experiencing groin pain and significant pain that affects him primarily when he gets out of bed and he is about to put weight on the leg. [name] I had him do a provocative Patrick maneuver which seem to be positive for left hip arthropathy. [name] I have ordered some x-rays of the left hip to evaluate what is going on with that. He is 315 pounds  and very likely to be having osteoarthritis of the hips and the knee. However at this point it seems to be the left hip that bothers him the most.  Post-Procedure Evaluation  Procedure: (11/16/2019) therapeutic right lumbar  facet RFA #3 under fluoroscopic guidance and IV sedation.  Left side done on 10/24/2019. Pre-procedure pain level: 6/10 Post-procedure: 3/10 50% relief  Sedation: Sedation provided.  Effectiveness during initial hour after procedure(Ultra-Short Term Relief): 80 %.  Local anesthetic used: Long-acting (4-6 hours) Effectiveness: Defined as any analgesic benefit obtained secondary to the administration of local anesthetics. This carries significant diagnostic value as to the etiological location, or anatomical origin, of the pain. Duration of benefit is expected to coincide with the duration of the local anesthetic used.  Effectiveness during initial 4-6 hours after procedure(Short-Term Relief): 80 %.  Long-term benefit: Defined as any relief [name] the pharmacologic duration of the local anesthetics.  Effectiveness [name] the initial 6 hours after procedure(Long-Term Relief): 40 %.  Current benefits: Defined as benefit that persist at this time.   Analgesia:  <50% better Function: Somewhat improved ROM: Somewhat improved  Pharmacotherapy Assessment  Analgesic: No opioid analgesics prescribed by our practice.   Monitoring: [place] PMP: PDMP reviewed during this encounter.       Pharmacotherapy: No side-effects or adverse reactions reported. Compliance: No problems identified. Effectiveness: Clinically acceptable. Plan: Refer to "POC".  UDS:  Summary  Date Value Ref Range Status  11/14/2019 Note  Final    Comment:    ==================================================================== ToxAssure Select,+Antidepr,UR ==================================================================== Test                             Result       Flag       Units  Drug Present   Morphine                       >15625                  ng/mg creat   Normorphine                    153                     ng/mg creat    Potential sources of large amounts of morphine in the absence of    codeine include  administration of morphine or use of heroin.     Normorphine is an expected metabolite of morphine.    Hydromorphone                  181                     ng/mg creat    Hydromorphone may be present as a metabolite of morphine;    concentrations of hydromorphone rarely exceed 5% of the morphine    concentration when this is the source of hydromorphone.    Oxycodone                      5281                    ng/mg creat   Oxymorphone                    113  ng/mg creat   Noroxycodone                   6731                    ng/mg creat    Sources of oxycodone include scheduled prescription medications.    Oxymorphone and noroxycodone are expected metabolites of oxycodone.    Oxymorphone is also available as a scheduled prescription medication.  ==================================================================== Test                      Result    Flag   Units      Ref Range   Creatinine              64               mg/dL      >=20 ==================================================================== Declared Medications:  Medication [name] was not provided. ==================================================================== For clinical consultation, please call [phone]. ====================================================================     Laboratory Chemistry Profile   Renal Lab Results  Component Value Date   BUN 9 06/13/2013   CREATININE 0.97 06/13/2013   LABCREA 113.87 02/04/2015   GFRAA >90 06/13/2013   GFRNONAA >90 06/13/2013     Hepatic Lab Results  Component Value Date   AST 36 06/02/2013   ALT 47 06/02/2013   ALBUMIN 3.5 06/02/2013   ALKPHOS 78 06/02/2013     Electrolytes Lab Results  Component Value Date   NA 136 (L) 06/13/2013   K 4.4 06/13/2013   CL 100 06/13/2013   CALCIUM 8.1 (L) 06/13/2013     Bone No results found for: VD25OH, VD125OH2TOT, BO1751WC5, EN2778EU2, 25OHVITD1, 25OHVITD2, 25OHVITD3, TESTOFREE,  TESTOSTERONE   Inflammation (CRP: Acute Phase) (ESR: Chronic Phase) No results found for: CRP, ESRSEDRATE, LATICACIDVEN     Note: Above Lab results reviewed.   Imaging  DG PAIN CLINIC C-ARM 1-60 MIN NO REPORT Fluoro was used, but no Radiologist interpretation will be provided.  Please refer to "NOTES" tab for provider progress note.  Assessment  The primary encounter diagnosis was Acute pain of left hip. Diagnoses of Chronic low back pain (Primary Source of Pain) (Bilateral) (R>L), DDD (degenerative disc disease), lumbar, and Lumbar facet syndrome (Bilateral) (R>L) were also pertinent to this visit.  Plan of Care  Problem-specific:  No problem-specific Assessment & Plan notes found for this encounter.  Mr. Sagar Tengan has a current medication [name] which includes the following long-term medication(s): gabapentin.  Pharmacotherapy (Medications Ordered): No orders of the defined types were placed in this encounter.  Orders:  Orders Placed This Encounter  Procedures  . DG HIP UNILAT W OR W/O PELVIS 2-3 VIEWS LEFT    Standing Status:   Future    Standing Expiration Date:   12/04/2020    Scheduling Instructions:     Please describe any evidence of DJD, such as joint narrowing, asymmetry, cysts, or any anomalies in bone density, production, or erosion.    Order Specific Question:   Reason for Exam (SYMPTOM  OR DIAGNOSIS REQUIRED)    Answer:   Left hip pain/arthralgia    Order Specific Question:   Preferred imaging location?    Answer:   [facility]    Order Specific Question:   Call Results- Best Contact Number?    Answer:   [phone] (Pain Clinic facility) (Dr. Dossie Arbour)   Follow-up plan:   No follow-ups on file.      Interventional treatment  options: Planned, scheduled, and/or pending:   We will be scheduling the patient to return for a left-sided lumbar facet RFA #3 under fluoroscopic guidance and IV sedation.  Once we complete the left side, he wants to have  the right side done.  These radiofrequencies were completed around 2019 and they have provided him with over 60% relief of the pain for 13 to 14 months.  We will continue to them for palliative reasons, but I have suggested to the patient to consider the possibility of an intrathecal pump implant.   Under consideration:   Palliative radiofrequency ablations of the lumbar facets   Therapeutic/palliative (PRN):   Palliative right lumbar facet RFA #3 (last done 02/17/2018) Palliative left lumbar facet RFA #3 (last done 04/05/2018)       Recent Visits Date Type Provider Dept  11/16/19 Procedure visit Milinda Pointer, MD [facility]  10/24/19 Procedure visit Milinda Pointer, MD [facility]  10/05/19 Telemedicine Milinda Pointer, MD [facility]  Showing recent visits within [name] 90 days and meeting all other requirements [name]'s Visits Date Type Provider Dept  12/05/19 Telemedicine Milinda Pointer, MD [facility]  Showing [name]'s visits and meeting all other requirements Future Appointments Date Type Provider Dept  12/28/19 Appointment Milinda Pointer, MD [facility]  Showing future appointments within next 90 days and meeting all other requirements  I discussed the assessment and treatment plan with the patient. The patient was provided an opportunity to ask questions and all were answered. The patient agreed with the plan and demonstrated an understanding of the instructions.  Patient advised to call back or seek an in-person evaluation if the symptoms or condition worsens.  Duration of encounter: 18 minutes.  Note by: Gaspar Cola, MD Date: 12/05/2019; Time: 3:48 PM

## 2019-12-05 ENCOUNTER — Encounter: Payer: Self-pay | Admitting: Pain Medicine

## 2019-12-05 ENCOUNTER — Ambulatory Visit: Payer: PPO | Attending: Pain Medicine | Admitting: Pain Medicine

## 2019-12-05 ENCOUNTER — Other Ambulatory Visit: Payer: Self-pay

## 2019-12-05 DIAGNOSIS — M5136 Other intervertebral disc degeneration, lumbar region: Secondary | ICD-10-CM | POA: Diagnosis not present

## 2019-12-05 DIAGNOSIS — M5442 Lumbago with sciatica, left side: Secondary | ICD-10-CM

## 2019-12-05 DIAGNOSIS — M25552 Pain in left hip: Secondary | ICD-10-CM

## 2019-12-05 DIAGNOSIS — M47816 Spondylosis without myelopathy or radiculopathy, lumbar region: Secondary | ICD-10-CM

## 2019-12-05 DIAGNOSIS — G8929 Other chronic pain: Secondary | ICD-10-CM | POA: Diagnosis not present

## 2019-12-07 ENCOUNTER — Ambulatory Visit (HOSPITAL_COMMUNITY)
Admission: RE | Admit: 2019-12-07 | Discharge: 2019-12-07 | Disposition: A | Discharge status: Home / Self Care | Payer: PPO | Source: Ambulatory Visit | Attending: Pain Medicine | Admitting: Pain Medicine

## 2019-12-07 ENCOUNTER — Other Ambulatory Visit: Payer: Self-pay

## 2019-12-07 DIAGNOSIS — M25552 Pain in left hip: Secondary | ICD-10-CM

## 2019-12-07 DIAGNOSIS — Z981 Arthrodesis status: Secondary | ICD-10-CM | POA: Diagnosis not present

## 2019-12-07 DIAGNOSIS — M1612 Unilateral primary osteoarthritis, left hip: Secondary | ICD-10-CM | POA: Diagnosis not present

## 2019-12-12 ENCOUNTER — Encounter: Payer: PPO | Attending: Physical Medicine & Rehabilitation | Admitting: Registered Nurse

## 2019-12-12 ENCOUNTER — Encounter: Payer: Self-pay | Admitting: Registered Nurse

## 2019-12-12 ENCOUNTER — Other Ambulatory Visit: Payer: Self-pay

## 2019-12-12 ENCOUNTER — Encounter: Payer: Self-pay | Admitting: Pain Medicine

## 2019-12-12 VITALS — BP 116/83 | HR 67 | Temp 98.3°F | Ht 73.0 in | Wt 305.4 lb

## 2019-12-12 DIAGNOSIS — Z79899 Other long term (current) drug therapy: Secondary | ICD-10-CM | POA: Diagnosis not present

## 2019-12-12 DIAGNOSIS — M62830 Muscle spasm of back: Secondary | ICD-10-CM

## 2019-12-12 DIAGNOSIS — G8928 Other chronic postprocedural pain: Secondary | ICD-10-CM | POA: Diagnosis not present

## 2019-12-12 DIAGNOSIS — Z79891 Long term (current) use of opiate analgesic: Secondary | ICD-10-CM | POA: Insufficient documentation

## 2019-12-12 DIAGNOSIS — M5136 Other intervertebral disc degeneration, lumbar region: Secondary | ICD-10-CM | POA: Diagnosis not present

## 2019-12-12 DIAGNOSIS — Z981 Arthrodesis status: Secondary | ICD-10-CM | POA: Insufficient documentation

## 2019-12-12 DIAGNOSIS — G894 Chronic pain syndrome: Secondary | ICD-10-CM | POA: Insufficient documentation

## 2019-12-12 DIAGNOSIS — M546 Pain in thoracic spine: Secondary | ICD-10-CM | POA: Diagnosis not present

## 2019-12-12 DIAGNOSIS — M961 Postlaminectomy syndrome, not elsewhere classified: Secondary | ICD-10-CM | POA: Diagnosis not present

## 2019-12-12 DIAGNOSIS — M545 Low back pain: Secondary | ICD-10-CM | POA: Insufficient documentation

## 2019-12-12 DIAGNOSIS — M79604 Pain in right leg: Secondary | ICD-10-CM | POA: Diagnosis not present

## 2019-12-12 DIAGNOSIS — M5416 Radiculopathy, lumbar region: Secondary | ICD-10-CM | POA: Diagnosis not present

## 2019-12-12 DIAGNOSIS — Z5181 Encounter for therapeutic drug level monitoring: Secondary | ICD-10-CM | POA: Diagnosis not present

## 2019-12-12 DIAGNOSIS — M79605 Pain in left leg: Secondary | ICD-10-CM | POA: Insufficient documentation

## 2019-12-12 MED ORDER — MORPHINE SULFATE ER 30 MG PO TBCR: 30.0000 mg | EXTENDED_RELEASE_TABLET | Freq: Two times a day (BID) | ORAL | 0 refills | Status: DC

## 2019-12-12 MED ORDER — OXYCODONE HCL 10 MG PO TABS: 10.0000 mg | ORAL_TABLET | Freq: Four times a day (QID) | ORAL | 0 refills | Status: DC | PRN

## 2019-12-12 NOTE — Progress Notes (Signed)
Subjective:    Patient ID: Samuel [name], male    DOB: 05/18/1976, 44 y.o.   MRN: [id number]  HPI: Samuel [name] is a 44 y.o. male who returns for follow up appointment for chronic pain and medication refill. He states his pain is located in his lower back pain radiating into his left hip and left lower extremity. Also reports left hip pain began after his Radiofrequency with Dr. Dossie Arbour, also states he called Dr. Dossie Arbour regarding the above and has an appointment on 12/13/2019. He  rates his pain 7. His current exercise regime is walking and performing stretching exercises.  [name] equivalent is 120.00 MME.  Last UDS was performed on 11/14/2019, it was consistent.   Pain Inventory Average Pain 7 Pain Right Now 7 My pain is constant, dull and aching  In the last 24 hours, has pain interfered with the following? General activity 9 Relation with others 9 Enjoyment of life 10 What TIME of day is your pain at its worst? morning evening medication Sleep (in general) Fair  Pain is worse with: walking, bending, sitting and standing Pain improves with: rest and medication Relief from Meds: 5  Mobility walk with assistance use a cane ability to climb steps?  yes do you drive?  yes  Function disabled: date disabled .  Neuro/Psych weakness trouble walking spasms depression  Prior Studies x-rays CT/MRI nerve study  Physicians involved in your care [place]   Family History  Problem Relation Age of Onset  . Hypertension Father    Social History   Socioeconomic History  . Marital status: Married    Spouse name: Not on file  . Number of children: Not on file  . Years of education: Not on file  . Highest education level: Not on file  Occupational History  . Not on file  Tobacco Use  . Smoking status: Never Smoker  . Smokeless tobacco: Never Used  Substance and Sexual Activity  . Alcohol use: No    Alcohol/week: 0.0 standard drinks  . Drug use:  No  . Sexual activity: Not on file  Other Topics Concern  . Not on file  Social History Narrative  . Not on file   Social Determinants of Health   Financial Resource Strain:   . Difficulty of Paying Living Expenses:   Food Insecurity:   . Worried About Charity fundraiser in the Last Year:   . Arboriculturist in the Last Year:   Transportation Needs:   . Film/video editor (Medical):   Marland Kitchen Lack of Transportation (Non-Medical):   Physical Activity:   . Days of Exercise per Week:   . Minutes of Exercise per Session:   Stress:   . Feeling of Stress :   Social Connections:   . Frequency of Communication with Friends and Family:   . Frequency of Social Gatherings with Friends and Family:   . Attends Religious Services:   . Active Member of Clubs or Organizations:   . Attends Archivist Meetings:   Marland Kitchen Marital Status:    Past Surgical History:  Procedure Laterality Date  . BACK SURGERY     lower back surgery x 2   Past Medical History:  Diagnosis Date  . Arthritis    "some in my back"  . GERD (gastroesophageal reflux disease)   . Headache(784.0)    BP 116/83   Pulse 67   Temp 98.3 F (36.8 C)   Ht 6\' 1"  (  1.854 m)   Wt (!) 305 lb 6.4 oz (138.5 kg)   SpO2 98%   BMI 40.29 kg/m   Opioid Risk Score:   Fall Risk Score:  `1  Depression screen PHQ 2/9  Depression screen [facility] 2/9 11/16/2019 06/13/2019 03/13/2019 06/14/2018 04/12/2018 04/05/2018 02/17/2018  Decreased Interest 0 1 1 0 0 0 0  Down, Depressed, Hopeless 0 1 1 0 0 0 0  PHQ - 2 Score 0 2 2 0 0 0 0  Altered sleeping - - - - - - -  Tired, decreased energy - - - - - - -  Change in appetite - - - - - - -  Feeling bad or failure about yourself  - - - - - - -  Trouble concentrating - - - - - - -  Moving slowly or fidgety/restless - - - - - - -  Suicidal thoughts - - - - - - -  PHQ-9 Score - - - - - - -  Difficult doing work/chores - - - - - - -    Review of Systems  Musculoskeletal: Positive for gait  problem.       Spams  Neurological: Positive for weakness.  Psychiatric/Behavioral: Positive for dysphoric mood.       Objective:   Physical Exam Vitals and nursing note reviewed.  Constitutional:      Appearance: Normal appearance.  Cardiovascular:     Rate and Rhythm: Normal rate and regular rhythm.     Pulses: Normal pulses.     Heart sounds: Normal heart sounds.  Pulmonary:     Effort: Pulmonary effort is normal.     Breath sounds: Normal breath sounds.  Musculoskeletal:     Cervical back: Normal range of motion and neck supple.     Comments: Normal Muscle Bulk and Muscle Testing Reveals:  Upper Extremities: Full ROM and Muscle Strength 5/5 Lumbar Hypersensitivity Left Greater Trochanteric Tenderness Lower Extremities: Decreased ROM and Muscle Strength 5/5 Bilateral Lower Extremity Flexion Produces Pain into Lumbar, Left Hip and Left Lower Extremity.  Arises from Table Slowly using cane for support Antalgic Gait   Skin:    General: Skin is warm and dry.  Neurological:     Mental Status: He is alert and oriented to person, place, and time.  Psychiatric:        Mood and Affect: Mood normal.        Behavior: Behavior normal.           Assessment & Plan:  1. 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 He has lumbar degenerative disc disease. Continuecurrent medication regimen withGabapentin, Continue current Medication regimen. S/P Left Lumbar Facet Radiofrequency Ablation with sedation, on 06/01/2021by Dr. Dossie Arbour with good results noted and S/P Right RFA withDr. Judi Cong June 24,2021. 12/12/2019. Refilled: MS Contin30 mg one tablet every 12 hours #60 and Oxycodone 10mg  one tablet every 6 hours as needed for pain #120. We will continue the opioid monitoring program, this consists of regular clinic visits, examinations, urine drug screen, pill counts as well as use of [place] Controlled Substance reporting System. 2.  Sacroiliac Dysfunction: Continue with current medication, heat and exercise regime.12/12/2019 3. Muscle Spasm:Continue Tizanidineas needed.Continueto Monitor.12/12/2019 4. Chronic Pain Syndrome: Continue MS Contin, Oxycodone andIbuprofen as needed for pain. Continue to Monitor.12/12/2019.  41minutes of face to face patient care time was spent during this visit. All questions were encouraged and answered.  F/U in 1 Month

## 2019-12-13 ENCOUNTER — Ambulatory Visit: Payer: PPO | Attending: Pain Medicine | Admitting: Pain Medicine

## 2019-12-13 DIAGNOSIS — M961 Postlaminectomy syndrome, not elsewhere classified: Secondary | ICD-10-CM

## 2019-12-13 DIAGNOSIS — M25552 Pain in left hip: Secondary | ICD-10-CM

## 2019-12-13 DIAGNOSIS — M5442 Lumbago with sciatica, left side: Secondary | ICD-10-CM

## 2019-12-13 DIAGNOSIS — M47816 Spondylosis without myelopathy or radiculopathy, lumbar region: Secondary | ICD-10-CM

## 2019-12-13 DIAGNOSIS — G894 Chronic pain syndrome: Secondary | ICD-10-CM

## 2019-12-13 DIAGNOSIS — M79604 Pain in right leg: Secondary | ICD-10-CM

## 2019-12-13 DIAGNOSIS — M160 Bilateral primary osteoarthritis of hip: Secondary | ICD-10-CM

## 2019-12-13 DIAGNOSIS — M79605 Pain in left leg: Secondary | ICD-10-CM

## 2019-12-13 DIAGNOSIS — G8929 Other chronic pain: Secondary | ICD-10-CM | POA: Diagnosis not present

## 2019-12-13 NOTE — Progress Notes (Signed)
Patient: Samuel [name]  Service Category: E/M  Provider: Gaspar Cola, MD  DOB: 04/23/1976  DOS: 12/13/2019  Location: Office  MRN: [id number]  Setting: Ambulatory outpatient  Referring Provider: No ref. provider found  Type: Established Patient  Specialty: Interventional Pain Management  PCP: System, Pcp Not In  Location: Remote location  Delivery: TeleHealth     Virtual Encounter - Pain Management PROVIDER NOTE: Information contained herein reflects review and annotations entered in association with encounter. Interpretation of such information and data should be left to medically-trained personnel. Information provided to patient can be located elsewhere in the medical record under "Patient Instructions". Document created using STT-dictation technology, any transcriptional errors that may result from process are unintentional.    Contact & Pharmacy Preferred: [phone] Home: [phone] (home) Mobile: [phone] (mobile) E-mail: [facility] [address], [place] [address] Phone: [phone] Fax: [phone]   Pre-screening  Samuel [name] offered "in-person" vs "virtual" encounter. He indicated preferring virtual for this encounter.   Reason COVID-19*  Social distancing based on CDC and AMA recommendations.   I contacted Samuel [name] on 12/13/2019 via telephone.      I clearly identified myself as Gaspar Cola, MD. I verified that I was speaking with the correct person using two identifiers (Name: Samuel [name], and date of birth: 09/20/1975).  Consent I sought verbal advanced consent from Samuel [name] for virtual visit interactions. I informed Samuel [name] of possible security and privacy concerns, risks, and limitations associated with providing "not-in-person" medical evaluation and management services. I also informed Samuel [name] of the availability of "in-person" appointments.  Finally, I informed him that there would be a charge for the virtual visit and that he could be  personally, fully or partially, financially responsible for it. Samuel [name] expressed understanding and agreed to proceed.   Historic Elements   Samuel [name] is a 44 y.o. year old, male patient evaluated today after his last contact with our practice on 11/17/2019. Samuel [name]  has a past medical history of Arthritis, GERD (gastroesophageal reflux disease), and Headache(784.0). He also  has a past surgical history that includes Back surgery. Samuel [name] has a current medication list which includes the following prescription(s): gabapentin, ibuprofen, morphine, OVER THE COUNTER MEDICATION, oxycodone hcl, and tizanidine. He  reports that he has never smoked. He has never used smokeless tobacco. He reports that he does not drink alcohol and does not use drugs. Samuel [name] has No Known Allergies.   HPI  Today, he is being contacted for follow-up evaluation after x-rays.  According to the patient he is still having some pain in that left hip.  Even though we ordered x-rays of the left hip only, the radiologist gave [place] a bonus by commenting that he could see mild degenerative joint disease affecting both hips.  By the time you can see degenerative changes on x-ray, this means that it has been there for a while.  Today I have recommended that he try some intra-articular joint injections with his primary pain physician in [place].  In addition, I had previously recommended to him to talk to him about the possibility of an intrathecal pump.  Due to his size, prior surgery on his back, and hardware, he is a very difficult candidate to continue offering him radiofrequency ablations.  This last time I had quite a bit of problems being able to obtain any type of  stimulation.  Because of this, I do not think that I want to continue doing any more radiofrequency ablations since my exposure to radiation is quite considerable when  doing cases like this.  I have talked to the patient about this and he understood and accepted.  I would be more than willing to do his intrathecal narcotic trial and if effective, then we can get a surgeon to do the implant.  Once he has that done, I would be more than willing to continue managing his pump for him.  Pharmacotherapy Assessment  Analgesic: No opioid analgesics prescribed by our practice.   Monitoring: [place] PMP: PDMP reviewed during this encounter.       Pharmacotherapy: No side-effects or adverse reactions reported. Compliance: No problems identified. Effectiveness: Clinically acceptable. Plan: Refer to "POC".  UDS:  Summary  Date Value Ref Range Status  11/14/2019 Note  Final    Comment:    ==================================================================== ToxAssure Select,+Antidepr,UR ==================================================================== Test                             Result       Flag       Units  Drug Present   Morphine                       >15625                  ng/mg creat   Normorphine                    153                     ng/mg creat    Potential sources of large amounts of morphine in the absence of    codeine include administration of morphine or use of heroin.     Normorphine is an expected metabolite of morphine.    Hydromorphone                  181                     ng/mg creat    Hydromorphone may be present as a metabolite of morphine;    concentrations of hydromorphone rarely exceed 5% of the morphine    concentration when this is the source of hydromorphone.    Oxycodone                      5281                    ng/mg creat   Oxymorphone                    113                     ng/mg creat   Noroxycodone                   6731                    ng/mg creat    Sources of oxycodone include scheduled prescription medications.    Oxymorphone and noroxycodone are expected metabolites of oxycodone.    Oxymorphone is  also available as a scheduled prescription medication.  ==================================================================== Test  Result    Flag   Units      Ref Range   Creatinine              64               mg/dL      >=20 ==================================================================== Declared Medications:  Medication list was not provided. ==================================================================== For clinical consultation, please call [phone]. ====================================================================     Laboratory Chemistry Profile   Renal Lab Results  Component Value Date   BUN 9 06/13/2013   CREATININE 0.97 06/13/2013   LABCREA 113.87 02/04/2015   GFRAA >90 06/13/2013   GFRNONAA >90 06/13/2013     Hepatic Lab Results  Component Value Date   AST 36 06/02/2013   ALT 47 06/02/2013   ALBUMIN 3.5 06/02/2013   ALKPHOS 78 06/02/2013     Electrolytes Lab Results  Component Value Date   NA 136 (L) 06/13/2013   K 4.4 06/13/2013   CL 100 06/13/2013   CALCIUM 8.1 (L) 06/13/2013     Bone No results found for: VD25OH, VD125OH2TOT, ZT2458KD9, IP3825KN3, 25OHVITD1, 25OHVITD2, 25OHVITD3, TESTOFREE, TESTOSTERONE   Inflammation (CRP: Acute Phase) (ESR: Chronic Phase) No results found for: CRP, ESRSEDRATE, LATICACIDVEN     Note: Above Lab results reviewed.  Imaging  DG HIP UNILAT W OR W/O PELVIS 2-3 VIEWS LEFT CLINICAL DATA:  Onset left hip pain 2.5 weeks.  No known injury.  EXAM: DG HIP (WITH OR WITHOUT PELVIS) 2-3V LEFT  COMPARISON:  None.  FINDINGS: There is no evidence of hip fracture or dislocation. Mild bilateral hip degenerative disease. Postoperative change of lower lumbar fusion is noted.  IMPRESSION: Mild bilateral hip degenerative disease.  Electronically Signed   By: Inge Rise M.D.   On: 12/08/2019 15:21  Assessment  The primary encounter diagnosis was Chronic pain syndrome.  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  Plan of Care  Problem-specific:  No problem-specific Assessment & Plan notes found for this encounter.  Samuel [name] has a current medication list which includes the following long-term medication(s): gabapentin.  Pharmacotherapy (Medications Ordered): No orders of the defined types were placed in this encounter.  Orders:  No orders of the defined types were placed in this encounter.  Follow-up plan:   Return if symptoms worsen or fail to improve.      Interventional treatment options: Planned, scheduled, and/or pending:   I have suggested to the patient to consider the possibility of an intrathecal pump implant.   Under consideration:   Possible intrathecal trial and pump implant if successful. At this point, I would not recommend any further radiofrequency ablation since the last one was quite difficult to get stimulation. Right lumbar facet RFA #3 (last done 11/16/2019) Not to be repeated. Left lumbar facet RFA #3 (last done 10/05/2019) Not to be repeated.   Therapeutic/palliative (PRN):   No further radiofrequency ablations.    Recent Visits Date Type Provider Dept  12/05/19 Telemedicine Milinda Pointer, MD [facility]  11/16/19 Procedure visit Milinda Pointer, MD [facility]  10/24/19 Procedure visit Milinda Pointer, MD [facility]  10/05/19 Telemedicine Milinda Pointer, MD [facility]  Showing recent visits within past  90 days and meeting all other requirements Today's Visits Date Type Provider Dept  12/13/19 Telemedicine Milinda Pointer, MD [facility]  Showing today's visits and meeting all other requirements Future Appointments Date Type Provider Dept  12/28/19 Appointment Milinda Pointer, MD [facility]  Showing future appointments within next 90 days and meeting all other requirements  I discussed the assessment and treatment plan with the patient. The patient was provided an opportunity to ask questions and all were answered. The patient agreed with the plan and demonstrated an understanding of the instructions.  Patient advised to call back or seek an in-person evaluation if the symptoms or condition worsens.  Duration of encounter: 12 minutes.  Note by: Gaspar Cola, MD Date: 12/13/2019; Time: 9:57 AM

## 2019-12-22 ENCOUNTER — Encounter: Payer: Self-pay | Admitting: Physical Medicine & Rehabilitation

## 2019-12-22 ENCOUNTER — Encounter (HOSPITAL_BASED_OUTPATIENT_CLINIC_OR_DEPARTMENT_OTHER): Payer: PPO | Admitting: Physical Medicine & Rehabilitation

## 2019-12-22 ENCOUNTER — Other Ambulatory Visit: Payer: Self-pay

## 2019-12-22 VITALS — BP 127/72 | HR 76 | Temp 98.9°F | Ht 73.0 in | Wt 309.4 lb

## 2019-12-22 DIAGNOSIS — M160 Bilateral primary osteoarthritis of hip: Secondary | ICD-10-CM

## 2019-12-22 DIAGNOSIS — G894 Chronic pain syndrome: Secondary | ICD-10-CM | POA: Diagnosis not present

## 2019-12-22 DIAGNOSIS — M25552 Pain in left hip: Secondary | ICD-10-CM

## 2019-12-22 DIAGNOSIS — G8929 Other chronic pain: Secondary | ICD-10-CM

## 2019-12-22 NOTE — Patient Instructions (Signed)
Hip joint injection under fluoro , Left hip injected with lidocaine and celestone

## 2019-12-22 NOTE — Progress Notes (Signed)
°  PROCEDURE RECORD [facility]   Name: Jai Steil DOB:23-Sep-1975 MRN: [id number]  Date:12/22/2019  Physician: Alysia Penna, MD    Nurse/CMA: Kenlynn Houde, CMA  Allergies: No Known Allergies  Consent Signed: Yes.    Is patient diabetic? No.  CBG today?   Pregnant: No. LMP: No LMP for male patient. (age 46-55)  Anticoagulants: no Anti-inflammatory: yes (ibuprofen) Antibiotics: no  Procedure: left intra-articular hip injection  Position: Supine Start Time: 2:06pm   End Time:         Fluoro Time:   RN/CMA Otho Michalik, CMA Velita Quirk, CMA    Time 1:30pm` 2:19pm    BP 127/72 152/77    Pulse 72 84    Respirations 14 14    O2 Sat 94 96    S/S 6 6    Pain Level 6/10 2/10     D/C home with self/wife, [name] & O X 3, D/C instructions reviewed, and sits independently.

## 2019-12-22 NOTE — Progress Notes (Signed)
hip intra-articular injection under Fluoro  Indication osteoarthritis unresponsive to conservative care including exercise and oral medications, patient referred by anesthesia pain management  Informed consent was obtained after describing risks and benefits of the procedure, this includes bleeding bruising and infection. The patient elected to proceed and has given written consent Patient placed supine on exam table.  Left groin area was marked and prepped with Betadine. 25-gauge 1.5 inch needle was utilized to anesthetize the skin and subcutaneous tissue with 3 cc of 1% lidocaine. Then a 22-gauge 5 inch spinal needle was inserted under direct fluoroscopic visualization, targeting the junction of the femoral head and femoral neck. Bone contact was made.Isovue 200 x 2cc injected demonstrating intra articular pattern.  Then a solution containing 1.5 mL of 6 mg per mL/tone and 3.5 ML of 1% lidocaine were injected after negative drawback for blood. Patient tolerated procedure well. Post procedure instructions given.

## 2019-12-26 ENCOUNTER — Ambulatory Visit: Payer: PPO | Admitting: Physical Medicine & Rehabilitation

## 2019-12-28 ENCOUNTER — Other Ambulatory Visit: Payer: Self-pay

## 2019-12-28 ENCOUNTER — Ambulatory Visit: Payer: PPO | Attending: Pain Medicine | Admitting: Pain Medicine

## 2019-12-28 DIAGNOSIS — G894 Chronic pain syndrome: Secondary | ICD-10-CM | POA: Diagnosis not present

## 2019-12-28 DIAGNOSIS — M25552 Pain in left hip: Secondary | ICD-10-CM

## 2019-12-28 NOTE — Progress Notes (Signed)
Patient: Samuel [name]  Service Category: E/M  Provider: Gaspar Cola, MD  DOB: 12-05-1975  DOS: 12/28/2019  Location: Office  MRN: [id number]  Setting: Ambulatory outpatient  Referring Provider: No ref. provider found  Type: Established Patient  Specialty: Interventional Pain Management  PCP: System, Pcp Not In  Location: Remote location  Delivery: TeleHealth     Virtual Encounter - Pain Management PROVIDER NOTE: Information contained herein reflects review and annotations entered in association with encounter. Interpretation of such information and data should be left to medically-trained personnel. Information provided to patient can be located elsewhere in the medical record under "Patient Instructions". Document created using STT-dictation technology, any transcriptional errors that may result from process are unintentional.    Contact & Pharmacy Preferred: [phone] Home: [phone] (home) Mobile: [phone] (mobile) E-mail: [email or web address], [place] [address] Phone: [phone] Fax: [phone]   Pre-screening  Samuel [name] offered "in-person" vs "virtual" encounter. He indicated preferring virtual for this encounter.   Reason COVID-19*   Social distancing based on CDC and AMA recommendations.   I contacted Samuel [name] on 12/28/2019 via telephone.      I clearly identified myself as Gaspar Cola, MD. I verified that I was speaking with the correct person using two identifiers (Name: Samuel [name], and date of birth: 1975-10-08).  Consent I sought verbal advanced consent from Samuel [name] for virtual visit interactions. I informed Samuel [name] of possible security and privacy concerns, risks, and limitations associated with providing "not-in-person" medical evaluation and management services. I also informed Samuel [name] of the availability of "in-person" appointments.  Finally, I informed him that there would be a charge for the virtual visit and that he could be  personally, fully or partially, financially responsible for it. [name] expressed understanding and agreed to proceed.   Historic Elements   Samuel [name] is a 44 y.o. year old, male patient evaluated today after his last contact with our practice on 11/17/2019. Samuel [name]  has a past medical history of Arthritis, GERD (gastroesophageal reflux disease), and Headache(784.0). He also  has a past surgical history that includes Back surgery. Samuel [name] has a current medication list which includes the following prescription(s): gabapentin, ibuprofen, morphine, OVER THE COUNTER MEDICATION, oxycodone hcl, and tizanidine. He  reports that he has never smoked. He has never used smokeless tobacco. He reports that he does not drink alcohol and does not use drugs. Samuel [name] has No Known Allergies.   HPI  Today, he is being contacted for a post-procedure assessment.  Talk to him about the possibility of Sprint temporary medial branch peripheral nerve stimulation. I think that he would really benefit from an intrathecal pump trial and perhaps an implant. The Sprint peripheral nerve stimulator is a second option to the pump. Today the patient refers that he attained 70% relief of the low back pain for the duration of the local anesthetic and then it went down to about a 50% of the continues to go on on that right side. I asked about the left side and he says that it is feeling okay and the leg pain is completely gone, but he still has pain in the hip area. He says that he had an intra-articular hip joint injection done with an anterior approach which did help some of the pain in the front of the hip area, but he still having quite  a bit of pain and discomfort in the lateral aspect suggesting the possibility that there may be a trochanteric bursitis also involved. Looking at the patient's size and known history of  osteoarthritis, if he does not improve with the injection, I would suggest doing an MRI of the left hip to further determine if there is some soft tissue involvement in the etiology of his left hip pain.  With regards to long-term plan of care, clearly, for the time being, he will need to continue on the oral opioid analgesics. He does have quite a bit of tolerance to them and I would suggest considering the possibility of a "Drug Holiday" to eliminate some of that tolerance. However, I would suggest doing this after we get his pain under better control. Since he is currently enjoying some benefit right now from the radiofrequency, this may not be a bad time to consider doing that. I am not managing his opioid analgesics and I have no interest in doing so, therefore this is just a recommendation. As to long-term interventional therapies, we have already talked to him about the possibility of an intrathecal narcotic trial to see if he would be a good candidate for a pump but lately there are some new therapies like to sprint PNS, which may be an alternative to treat some of his facet problems.  Post-Procedure Evaluation  Procedure (11/16/2019): Palliative right-sided lumbar facet RFA #3 under fluoroscopic guidance and IV sedation Pre-procedure pain level: 6/10 Post-procedure: 3/10 50% relief  Sedation: Sedation provided.  Effectiveness during initial hour after procedure(Ultra-Short Term Relief): 70 %.  Local anesthetic used: Long-acting (4-6 hours) Effectiveness: Defined as any analgesic benefit obtained secondary to the administration of local anesthetics. This carries significant diagnostic value as to the etiological location, or anatomical origin, of the pain. Duration of benefit is expected to coincide with the duration of the local anesthetic used.  Effectiveness during initial 4-6 hours after procedure(Short-Term Relief): 70 %.  Long-term benefit: Defined as any relief past the pharmacologic  duration of the local anesthetics.  Effectiveness past the initial 6 hours after procedure(Long-Term Relief): 50 %.  Current benefits: Defined as benefit that persist at this time.   Analgesia:  50% improved Function: Samuel [name] reports improvement in function ROM: Samuel [name] reports improvement in ROM  Pharmacotherapy Assessment  Analgesic: No opioid analgesics prescribed by our practice.   Monitoring: [place] PMP: PDMP reviewed during this encounter.       Pharmacotherapy: No side-effects or adverse reactions reported. Compliance: No problems identified. Effectiveness: Clinically acceptable. Plan: Refer to "POC".  UDS:  Summary  Date Value Ref Range Status  11/14/2019 Note  Final    Comment:    ==================================================================== ToxAssure Select,+Antidepr,UR ==================================================================== Test                             Result       Flag       Units  Drug Present   Morphine                       >15625                  ng/mg creat   Normorphine                    153  ng/mg creat    Potential sources of large amounts of morphine in the absence of    codeine include administration of morphine or use of heroin.     Normorphine is an expected metabolite of morphine.    Hydromorphone                  181                     ng/mg creat    Hydromorphone may be present as a metabolite of morphine;    concentrations of hydromorphone rarely exceed 5% of the morphine    concentration when this is the source of hydromorphone.    Oxycodone                      5281                    ng/mg creat   Oxymorphone                    113                     ng/mg creat   Noroxycodone                   6731                    ng/mg creat    Sources of oxycodone include scheduled prescription medications.    Oxymorphone and noroxycodone are expected metabolites of oxycodone.    Oxymorphone is also  available as a scheduled prescription medication.  ==================================================================== Test                      Result    Flag   Units      Ref Range   Creatinine              64               mg/dL      >=20 ==================================================================== Declared Medications:  Medication list was not provided. ==================================================================== For clinical consultation, please call [phone]. ====================================================================     Laboratory Chemistry Profile   Renal Lab Results  Component Value Date   BUN 9 06/13/2013   CREATININE 0.97 06/13/2013   LABCREA 113.87 02/04/2015   GFRAA >90 06/13/2013   GFRNONAA >90 06/13/2013     Hepatic Lab Results  Component Value Date   AST 36 06/02/2013   ALT 47 06/02/2013   ALBUMIN 3.5 06/02/2013   ALKPHOS 78 06/02/2013     Electrolytes Lab Results  Component Value Date   NA 136 (L) 06/13/2013   K 4.4 06/13/2013   CL 100 06/13/2013   CALCIUM 8.1 (L) 06/13/2013     Bone No results found for: VD25OH, VD125OH2TOT, KA7681LX7, WI2035DH7, 25OHVITD1, 25OHVITD2, 25OHVITD3, TESTOFREE, TESTOSTERONE   Inflammation (CRP: Acute Phase) (ESR: Chronic Phase) No results found for: CRP, ESRSEDRATE, LATICACIDVEN     Note: Above Lab results reviewed.   Imaging  DG HIP UNILAT W OR W/O PELVIS 2-3 VIEWS LEFT CLINICAL DATA:  Onset left hip pain 2.5 weeks.  No known injury.  EXAM: DG HIP (WITH OR WITHOUT PELVIS) 2-3V LEFT  COMPARISON:  None.  FINDINGS: There is no evidence of hip fracture or dislocation. Mild bilateral hip degenerative disease. Postoperative change of lower lumbar fusion is noted.  IMPRESSION: Mild bilateral hip  degenerative disease.  Electronically Signed   By: Inge Rise M.D.   On: 12/08/2019 15:21  Assessment  There were no encounter diagnoses.  Plan of Care  Problem-specific:   No problem-specific Assessment & Plan notes found for this encounter.  Samuel [name] has a current medication list which includes the following long-term medication(s): gabapentin.  Pharmacotherapy (Medications Ordered): No orders of the defined types were placed in this encounter.  Orders:  No orders of the defined types were placed in this encounter.  Follow-up plan:   No follow-ups on file.      Interventional treatment options: Planned, scheduled, and/or pending:   I have suggested to the patient to consider the possibility of an intrathecal pump implant.   Under consideration:   Possible intrathecal trial and pump implant if successful. At this point, I would not recommend any further radiofrequency ablation since the last one was quite difficult to get stimulation. Right lumbar facet RFA #3 (last done 11/16/2019) Not to be repeated. Left lumbar facet RFA #3 (last done 10/05/2019) Not to be repeated. Consider intrathecal opioid trial to see if he would be a candidate for a pump implant.  Consider lumbar medial branch peripheral nerve stimulation using the Sprint PNS system    Therapeutic/palliative (PRN):   No further radiofrequency ablations.     Recent Visits Date Type Provider Dept  12/13/19 Telemedicine Milinda Pointer, MD [facility]  12/05/19 Telemedicine Milinda Pointer, MD [facility]  11/16/19 Procedure visit Milinda Pointer, MD [facility]  10/24/19 Procedure visit Milinda Pointer, MD [facility]  10/05/19 Telemedicine Milinda Pointer, MD [facility]  Showing recent visits within past 90 days and meeting all other requirements Today's Visits Date Type Provider Dept  12/28/19 Telemedicine Milinda Pointer, MD [facility]  Showing today's visits and meeting all other requirements Future Appointments No visits were found meeting these conditions. Showing future appointments  within next 90 days and meeting all other requirements  I discussed the assessment and treatment plan with the patient. The patient was provided an opportunity to ask questions and all were answered. The patient agreed with the plan and demonstrated an understanding of the instructions.  Patient advised to call back or seek an in-person evaluation if the symptoms or condition worsens.  Duration of encounter: 16 minutes.  Note by: Gaspar Cola, MD Date: 12/28/2019; Time: 10:43 AM

## 2020-01-16 ENCOUNTER — Encounter: Payer: Self-pay | Admitting: Registered Nurse

## 2020-01-16 ENCOUNTER — Encounter: Payer: PPO | Attending: Physical Medicine & Rehabilitation | Admitting: Registered Nurse

## 2020-01-16 ENCOUNTER — Other Ambulatory Visit: Payer: Self-pay

## 2020-01-16 VITALS — BP 128/77 | HR 66 | Temp 98.2°F | Ht 73.0 in | Wt 312.0 lb

## 2020-01-16 DIAGNOSIS — M79604 Pain in right leg: Secondary | ICD-10-CM | POA: Insufficient documentation

## 2020-01-16 DIAGNOSIS — M5416 Radiculopathy, lumbar region: Secondary | ICD-10-CM

## 2020-01-16 DIAGNOSIS — M62830 Muscle spasm of back: Secondary | ICD-10-CM

## 2020-01-16 DIAGNOSIS — M5136 Other intervertebral disc degeneration, lumbar region: Secondary | ICD-10-CM

## 2020-01-16 DIAGNOSIS — Z79899 Other long term (current) drug therapy: Secondary | ICD-10-CM | POA: Insufficient documentation

## 2020-01-16 DIAGNOSIS — Z5181 Encounter for therapeutic drug level monitoring: Secondary | ICD-10-CM | POA: Diagnosis not present

## 2020-01-16 DIAGNOSIS — Z79891 Long term (current) use of opiate analgesic: Secondary | ICD-10-CM | POA: Insufficient documentation

## 2020-01-16 DIAGNOSIS — M79605 Pain in left leg: Secondary | ICD-10-CM | POA: Diagnosis not present

## 2020-01-16 DIAGNOSIS — M545 Low back pain: Secondary | ICD-10-CM | POA: Diagnosis not present

## 2020-01-16 DIAGNOSIS — M546 Pain in thoracic spine: Secondary | ICD-10-CM | POA: Insufficient documentation

## 2020-01-16 DIAGNOSIS — G894 Chronic pain syndrome: Secondary | ICD-10-CM | POA: Diagnosis not present

## 2020-01-16 DIAGNOSIS — G8929 Other chronic pain: Secondary | ICD-10-CM

## 2020-01-16 DIAGNOSIS — M160 Bilateral primary osteoarthritis of hip: Secondary | ICD-10-CM

## 2020-01-16 DIAGNOSIS — Z981 Arthrodesis status: Secondary | ICD-10-CM | POA: Diagnosis not present

## 2020-01-16 DIAGNOSIS — M25552 Pain in left hip: Secondary | ICD-10-CM

## 2020-01-16 DIAGNOSIS — M961 Postlaminectomy syndrome, not elsewhere classified: Secondary | ICD-10-CM | POA: Diagnosis not present

## 2020-01-16 DIAGNOSIS — G8928 Other chronic postprocedural pain: Secondary | ICD-10-CM | POA: Diagnosis not present

## 2020-01-16 MED ORDER — TIZANIDINE HCL 2 MG PO TABS: 2.0000 mg | ORAL_TABLET | Freq: Three times a day (TID) | ORAL | 3 refills | Status: DC | PRN

## 2020-01-16 MED ORDER — GABAPENTIN 300 MG PO CAPS: ORAL_CAPSULE | ORAL | 2 refills | Status: DC

## 2020-01-16 MED ORDER — MORPHINE SULFATE ER 30 MG PO TBCR: 30.0000 mg | EXTENDED_RELEASE_TABLET | Freq: Two times a day (BID) | ORAL | 0 refills | Status: DC

## 2020-01-16 MED ORDER — OXYCODONE HCL 10 MG PO TABS: 10.0000 mg | ORAL_TABLET | Freq: Four times a day (QID) | ORAL | 0 refills | Status: DC | PRN

## 2020-01-16 MED ORDER — IBUPROFEN 800 MG PO TABS
800.0000 mg | ORAL_TABLET | Freq: Three times a day (TID) | ORAL | 2 refills | Status: DC | PRN
Start: 2020-01-16 — End: 2020-04-16

## 2020-01-16 NOTE — Progress Notes (Addendum)
Subjective:    Patient ID: Samuel [name], male    DOB: 02-Jun-1975, 44 y.o.   MRN: [id number]  HPI: Samuel [name] is a 44 y.o. male who returns for follow up appointment for chronic pain and medication refill. He states his pain is located in his lower back radiating into his left hip. He  rates his pain 6. His  current exercise regime is walking and performing stretching exercises.  Samuel [name] Morphine equivalent is 120.00 MME.    Last UDS was Performed on 11/14/2019, it was consistent.    Pain Inventory Average Pain 6 Pain Right Now 6 My pain is constant, sharp, dull, stabbing and aching  In the last 24 hours, has pain interfered with the following? General activity 9 Relation with others 9 Enjoyment of life 9 What TIME of day is your pain at its worst? morning , evening and night Sleep (in general) Fair  Pain is worse with: walking, bending, sitting, inactivity and standing Pain improves with: rest and medication Relief from Meds: 4  Family History  Problem Relation Age of Onset  . Hypertension Father    Social History   Socioeconomic History  . Marital status: Married    Spouse name: Not on file  . Number of children: Not on file  . Years of education: Not on file  . Highest education level: Not on file  Occupational History  . Not on file  Tobacco Use  . Smoking status: Never Smoker  . Smokeless tobacco: Never Used  Substance and Sexual Activity  . Alcohol use: No    Alcohol/week: 0.0 standard drinks  . Drug use: No  . Sexual activity: Not on file  Other Topics Concern  . Not on file  Social History Narrative  . Not on file   Social Determinants of Health   Financial Resource Strain:   . Difficulty of Paying Living Expenses: Not on file  Food Insecurity:   . Worried About Charity fundraiser in the Last Year: Not on file  . Ran Out of Food in the Last Year: Not on file  Transportation Needs:   . Lack of Transportation (Medical): Not on file    . Lack of Transportation (Non-Medical): Not on file  Physical Activity:   . Days of Exercise per Week: Not on file  . Minutes of Exercise per Session: Not on file  Stress:   . Feeling of Stress : Not on file  Social Connections:   . Frequency of Communication with Friends and Family: Not on file  . Frequency of Social Gatherings with Friends and Family: Not on file  . Attends Religious Services: Not on file  . Active Member of Clubs or Organizations: Not on file  . Attends Archivist Meetings: Not on file  . Marital Status: Not on file   Past Surgical History:  Procedure Laterality Date  . BACK SURGERY     lower back surgery x 2   Past Surgical History:  Procedure Laterality Date  . BACK SURGERY     lower back surgery x 2   Past Medical History:  Diagnosis Date  . Arthritis    "some in my back"  . GERD (gastroesophageal reflux disease)   . Headache(784.0)    BP 128/77   Pulse 66   Temp 98.2 F (36.8 C)   Ht 6\' 1"  (1.854 m)   Wt (!) 312 lb (141.5 kg)   SpO2 96%   BMI 41.16 kg/m  Opioid Risk Score:   Fall Risk Score:  `1  Depression screen PHQ 2/9  Depression screen [facility] 2/9 12/22/2019 11/16/2019 06/13/2019 03/13/2019 06/14/2018 04/12/2018 04/05/2018  Decreased Interest 1 0 1 1 0 0 0  Down, Depressed, Hopeless 1 0 1 1 0 0 0  PHQ - 2 Score 2 0 2 2 0 0 0  Altered sleeping - - - - - - -  Tired, decreased energy - - - - - - -  Change in appetite - - - - - - -  Feeling bad or failure about yourself  - - - - - - -  Trouble concentrating - - - - - - -  Moving slowly or fidgety/restless - - - - - - -  Suicidal thoughts - - - - - - -  PHQ-9 Score - - - - - - -  Difficult doing work/chores - - - - - - -   Review of Systems  Musculoskeletal: Positive for back pain and gait problem.       Hip pain   Neurological: Positive for weakness and numbness.  Psychiatric/Behavioral: Positive for dysphoric mood. The patient is nervous/anxious.   All other systems  reviewed and are negative.      Objective:   Physical Exam Vitals and nursing note reviewed.  Constitutional:      Appearance: Normal appearance.  Cardiovascular:     Rate and Rhythm: Normal rate and regular rhythm.     Pulses: Normal pulses.     Heart sounds: Normal heart sounds.  Pulmonary:     Effort: Pulmonary effort is normal.     Breath sounds: Normal breath sounds.  Musculoskeletal:     Cervical back: Normal range of motion and neck supple.     Comments: Normal Muscle Bulk and Muscle Testing Reveals:  Upper Extremities: Full ROM and Muscle Strength 5/5  Lumbar Hypersensitivity Left Greater Trochanteric Tenderness Lower Extremities: Decreased ROM and Muscle Strength 5/5  Bilateral Lower Extremity Flexion Produces Pain into his Lumbar Arises from Table Slowly using cane for support Antalgic Gait   Skin:    General: Skin is warm and dry.  Neurological:     Mental Status: He is alert and oriented to person, place, and time.  Psychiatric:        Mood and Affect: Mood normal.        Behavior: Behavior normal.           Assessment & Plan:  1. 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 He has lumbar degenerative disc disease. Continuecurrent medication regimen withGabapentin, Continue current Medication regimen. S/P Left Lumbar Facet Radiofrequency Ablation with sedation, on06/01/2021by Dr. Dossie Arbour with good results noted and S/PRightRFA withDr. Judi Cong [id number]. 01/16/2020. Refilled: MS Contin30 mg one tablet every 12 hours #60 and Oxycodone 10mg  one tablet every 6 hours as needed for pain #120. We will continue the opioid monitoring program, this consists of regular clinic visits, examinations, urine drug screen, pill counts as well as use of [place] Controlled Substance Reporting system. A 12 month History has been reviewed on the [place] on 01/16/2020. 2. Sacroiliac  Dysfunction: Continue with current medication, heat and exercise regime.01/16/2020 3. Muscle Spasm:Continue Tizanidineas needed.Continueto Monitor.01/16/2020 4. Chronic Pain Syndrome: Continue MS Contin, Oxycodone andIbuprofen as needed for pain. Continue to Monitor.01/16/2020. 5. Left Greater Trochanteric Tenderness:  Left Hip Osteoarthritis: S/P left intra-articular hip injection  with 8 days of relief Mr. Toth reports. Continue with alternating  Ice and Heat Therapy. Continue to monitor.        49minutes of face to face patient care time was spent during this visit. All questions were encouraged and answered.  F/U in 1 Month

## 2020-02-13 ENCOUNTER — Encounter: Payer: Self-pay | Admitting: Registered Nurse

## 2020-02-13 ENCOUNTER — Other Ambulatory Visit: Payer: Self-pay

## 2020-02-13 ENCOUNTER — Encounter: Payer: PPO | Attending: Physical Medicine & Rehabilitation | Admitting: Registered Nurse

## 2020-02-13 VITALS — BP 134/80 | HR 60 | Temp 97.8°F | Ht 73.0 in | Wt 315.6 lb

## 2020-02-13 DIAGNOSIS — G894 Chronic pain syndrome: Secondary | ICD-10-CM | POA: Diagnosis not present

## 2020-02-13 DIAGNOSIS — Z981 Arthrodesis status: Secondary | ICD-10-CM | POA: Diagnosis not present

## 2020-02-13 DIAGNOSIS — G8928 Other chronic postprocedural pain: Secondary | ICD-10-CM | POA: Insufficient documentation

## 2020-02-13 DIAGNOSIS — Z5181 Encounter for therapeutic drug level monitoring: Secondary | ICD-10-CM | POA: Diagnosis not present

## 2020-02-13 DIAGNOSIS — M5416 Radiculopathy, lumbar region: Secondary | ICD-10-CM

## 2020-02-13 DIAGNOSIS — M961 Postlaminectomy syndrome, not elsewhere classified: Secondary | ICD-10-CM | POA: Diagnosis not present

## 2020-02-13 DIAGNOSIS — M546 Pain in thoracic spine: Secondary | ICD-10-CM | POA: Diagnosis not present

## 2020-02-13 DIAGNOSIS — M79605 Pain in left leg: Secondary | ICD-10-CM | POA: Insufficient documentation

## 2020-02-13 DIAGNOSIS — M5136 Other intervertebral disc degeneration, lumbar region: Secondary | ICD-10-CM

## 2020-02-13 DIAGNOSIS — Z79899 Other long term (current) drug therapy: Secondary | ICD-10-CM | POA: Insufficient documentation

## 2020-02-13 DIAGNOSIS — Z79891 Long term (current) use of opiate analgesic: Secondary | ICD-10-CM

## 2020-02-13 DIAGNOSIS — M62830 Muscle spasm of back: Secondary | ICD-10-CM | POA: Diagnosis not present

## 2020-02-13 DIAGNOSIS — M545 Low back pain: Secondary | ICD-10-CM | POA: Insufficient documentation

## 2020-02-13 DIAGNOSIS — M79604 Pain in right leg: Secondary | ICD-10-CM | POA: Insufficient documentation

## 2020-02-13 MED ORDER — MORPHINE SULFATE ER 30 MG PO TBCR
30.0000 mg | EXTENDED_RELEASE_TABLET | Freq: Two times a day (BID) | ORAL | 0 refills | Status: DC
Start: 2020-02-13 — End: 2020-03-15

## 2020-02-13 MED ORDER — OXYCODONE HCL 10 MG PO TABS
10.0000 mg | ORAL_TABLET | Freq: Four times a day (QID) | ORAL | 0 refills | Status: DC | PRN
Start: 2020-02-13 — End: 2020-03-15

## 2020-02-13 NOTE — Progress Notes (Signed)
Subjective:    Patient ID: Samuel [name], male    DOB: 1976/02/24, 44 y.o.   MRN: [id number]  HPI: Mick Tanguma is a 44 y.o. male who returns for follow up appointment for chronic pain and medication refill. He states his pain is located in his lower back radiating into his bilateral lower extremities. He rates his pain 7. His current exercise regime is walking and performing stretching exercises.  Mr. Henriques Morphine equivalent is 120.00  MME.  UDS ordered today.    Pain Inventory Average Pain 6 Pain Right Now 7 My pain is sharp, dull, stabbing and aching  In the last 24 hours, has pain interfered with the following? General activity 9 Relation with others 9 Enjoyment of life 9 What TIME of day is your pain at its worst? morning , evening and night Sleep (in general) Fair  Pain is worse with: walking, bending, sitting, inactivity and standing Pain improves with: rest and medication Relief from Meds: 5  Family History  Problem Relation Age of Onset  . Hypertension Father    Social History   Socioeconomic History  . Marital status: Married    Spouse name: Not on file  . Number of children: Not on file  . Years of education: Not on file  . Highest education level: Not on file  Occupational History  . Not on file  Tobacco Use  . Smoking status: Never Smoker  . Smokeless tobacco: Never Used  Substance and Sexual Activity  . Alcohol use: No    Alcohol/week: 0.0 standard drinks  . Drug use: No  . Sexual activity: Not on file  Other Topics Concern  . Not on file  Social History Narrative  . Not on file   Social Determinants of Health   Financial Resource Strain:   . Difficulty of Paying Living Expenses: Not on file  Food Insecurity:   . Worried About Charity fundraiser in the Last Year: Not on file  . Ran Out of Food in the Last Year: Not on file  Transportation Needs:   . Lack of Transportation (Medical): Not on file  . Lack of Transportation  (Non-Medical): Not on file  Physical Activity:   . Days of Exercise per Week: Not on file  . Minutes of Exercise per Session: Not on file  Stress:   . Feeling of Stress : Not on file  Social Connections:   . Frequency of Communication with Friends and Family: Not on file  . Frequency of Social Gatherings with Friends and Family: Not on file  . Attends Religious Services: Not on file  . Active Member of Clubs or Organizations: Not on file  . Attends Archivist Meetings: Not on file  . Marital Status: Not on file   Past Surgical History:  Procedure Laterality Date  . BACK SURGERY     lower back surgery x 2   Past Surgical History:  Procedure Laterality Date  . BACK SURGERY     lower back surgery x 2   Past Medical History:  Diagnosis Date  . Arthritis    "some in my back"  . GERD (gastroesophageal reflux disease)   . Headache(784.0)    BP 134/80   Pulse 60   Temp 97.8 F (36.6 C)   Ht 6\' 1"  (1.854 m)   Wt (!) 315 lb 9.6 oz (143.2 kg)   SpO2 99%   BMI 41.64 kg/m   Opioid Risk Score:   Fall  Risk Score:  `1  Depression screen PHQ 2/9  Depression screen [facility] 2/9 02/13/2020 01/16/2020 12/22/2019 11/16/2019 06/13/2019 03/13/2019 06/14/2018  Decreased Interest 1 2 1  0 1 1 0  Down, Depressed, Hopeless 1 2 1  0 1 1 0  PHQ - 2 Score 2 4 2  0 2 2 0  Altered sleeping - - - - - - -  Tired, decreased energy - - - - - - -  Change in appetite - - - - - - -  Feeling bad or failure about yourself  - - - - - - -  Trouble concentrating - - - - - - -  Moving slowly or fidgety/restless - - - - - - -  Suicidal thoughts - - - - - - -  PHQ-9 Score - - - - - - -  Difficult doing work/chores - - - - - - -  Some recent data might be hidden      Review of Systems  Constitutional: Positive for fatigue.  HENT: Negative.   Eyes: Negative.   Respiratory: Negative.   Cardiovascular: Negative.   Gastrointestinal: Negative.   Endocrine: Negative.   Genitourinary: Negative.     Musculoskeletal: Positive for back pain and gait problem.  Skin: Negative.   Allergic/Immunologic: Negative.   Neurological: Positive for weakness.  Hematological: Negative.   Psychiatric/Behavioral: Positive for dysphoric mood.  All other systems reviewed and are negative.      Objective:   Physical Exam Vitals and nursing note reviewed.  Constitutional:      Appearance: Normal appearance.  Cardiovascular:     Rate and Rhythm: Normal rate and regular rhythm.     Pulses: Normal pulses.     Heart sounds: Normal heart sounds.  Pulmonary:     Effort: Pulmonary effort is normal.     Breath sounds: Normal breath sounds.  Musculoskeletal:     Cervical back: Normal range of motion and neck supple.     Comments: Normal Muscle Bulk and Muscle Testing Reveals:  Upper Extremities: Decreased ROM 90 Degrees  and Muscle Strength 5/5 Lumbar Hypersensitivity Lower Extremities: Full ROM and Muscle Strength 5/5 Arises from Table Slowly using cane for support Antalgic  Gait   Skin:    General: Skin is warm and dry.  Neurological:     Mental Status: He is alert and oriented to person, place, and time.  Psychiatric:        Mood and Affect: Mood normal.        Behavior: Behavior normal.           Assessment & Plan:  1. 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 He has lumbar degenerative disc disease. Continuecurrent medication regimen withGabapentin, Continue current Medication regimen. S/P Left Lumbar Facet Radiofrequency Ablation with sedation, on06/01/2021by Dr. Dossie Arbour with good results notedand S/PRightRFA withDr. Judi Cong [id number]. 02/13/2020. Refilled: MS Contin30 mg one tablet every 12 hours #60 and Oxycodone 10mg  one tablet every 6 hours as needed for pain #120. We will continue the opioid monitoring program, this consists of regular clinic visits, examinations, urine drug screen, pill counts as well as use of [place]  Controlled Substance Reporting system. A 12 month History has been reviewed on the [place] on 02/13/2020. 2. Sacroiliac Dysfunction: Continue with current medication, heat and exercise regime.02/13/2020 3. Muscle Spasm:Continue Tizanidineas needed.Continueto Monitor.02/13/2020 4. Chronic Pain Syndrome: Continue MS Contin, Oxycodone andIbuprofen as needed for pain. Continue to Monitor.02/13/2020. 5. Left Greater Trochanteric  Tenderness: No complaints today.  Left Hip Osteoarthritis: S/P left intra-articular hip injection with 8 days of relief Mr. Kise reports. Continue with alternating Ice and Heat Therapy. Continue to monitor. 02/13/2020.  58minutes of face to face patient care time was spent during this visit. All questions were encouraged and answered.  F/U in 1 Month

## 2020-02-16 LAB — TOXASSURE SELECT,+ANTIDEPR,UR

## 2020-02-19 ENCOUNTER — Ambulatory Visit: Admit: 2020-02-19 | Discharge status: Absent without leave | Payer: PPO

## 2020-02-20 DIAGNOSIS — S93401A Sprain of unspecified ligament of right ankle, initial encounter: Secondary | ICD-10-CM | POA: Diagnosis not present

## 2020-02-20 DIAGNOSIS — G8911 Acute pain due to trauma: Secondary | ICD-10-CM | POA: Diagnosis not present

## 2020-02-20 DIAGNOSIS — R2241 Localized swelling, mass and lump, right lower limb: Secondary | ICD-10-CM | POA: Diagnosis not present

## 2020-02-20 DIAGNOSIS — Z79899 Other long term (current) drug therapy: Secondary | ICD-10-CM | POA: Diagnosis not present

## 2020-02-20 DIAGNOSIS — S93601A Unspecified sprain of right foot, initial encounter: Secondary | ICD-10-CM | POA: Diagnosis not present

## 2020-02-23 ENCOUNTER — Telehealth: Payer: Self-pay | Admitting: *Deleted

## 2020-02-23 NOTE — Telephone Encounter (Signed)
Urine drug screen for this encounter is consistent for prescribed medication 

## 2020-02-26 ENCOUNTER — Ambulatory Visit
Admission: RE | Admit: 2020-02-26 | Discharge: 2020-02-26 | Disposition: A | Discharge status: Home / Self Care | Payer: PPO | Source: Ambulatory Visit | Attending: Emergency Medicine | Admitting: Emergency Medicine

## 2020-02-26 ENCOUNTER — Other Ambulatory Visit: Payer: Self-pay

## 2020-02-26 VITALS — HR 89 | Temp 98.3°F | Resp 18

## 2020-02-26 DIAGNOSIS — L03115 Cellulitis of right lower limb: Secondary | ICD-10-CM

## 2020-02-26 MED ORDER — DOXYCYCLINE HYCLATE 100 MG PO CAPS: 100.0000 mg | ORAL_CAPSULE | Freq: Two times a day (BID) | ORAL | 0 refills | Status: AC

## 2020-02-26 NOTE — ED Provider Notes (Signed)
[facility]    CSN: [id number] Arrival date & time: 02/26/20  1537      History   Chief Complaint Chief Complaint  Patient presents with  . Ankle Pain    twisted x 1 week, painful    HPI Samuel [name] is a 44 y.o. male  Presenting for worsening pain, redness to right ankle.  States that he twisted his ankle over a week ago: Was evaluated in ER: Had negative DVT US study, x-ray negative for fracture.  Patient states ankle pain has improved, though superficial/skin pain has worsened.  Noticed erythema as well.  Denies discharge, repeat trauma, fever, arthralgias, myalgias.  Past Medical History:  Diagnosis Date  . Arthritis    "some in my back"  . GERD (gastroesophageal reflux disease)   . XKGYJEHU(314.9)     Patient Active Problem List   Diagnosis Date Noted  . Osteoarthritis of hips (Bilateral) (L>R) 12/13/2019  . Chronic lower extremity pain (2ry area of Pain) (Bilateral) (R>L) 12/13/2019  . Chronic hip pain (Left) 12/05/2019  . Uncomplicated opioid dependence ([place]) 11/16/2019  . Spondylosis without myelopathy or radiculopathy, lumbosacral region 02/17/2018  . Chronic pain syndrome 04/01/2016  . Situational anxiety 04/01/2016  . Acute postoperative pain 12/24/2015  . Lumbar facet syndrome (Bilateral) (R>L) 09/05/2015  . Chronic low back pain (1ry area of Pain) (Bilateral) (R>L) 09/05/2015  . Lumbar spondylosis 09/05/2015  . Long term current use of opiate analgesic 09/05/2015  . Long term prescription opiate use 09/05/2015  . Opiate use (120 MME/Day) 09/05/2015  . Encounter for pain management planning 09/05/2015  . 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 Lumbar post-laminectomy syndrome 04/05/2014  . Chronic lumbar radicular pain (Bilateral) (R>L) (L5 dermatome) 04/05/2014  . Sacroiliac dysfunction 04/05/2014  . DDD  (degenerative disc disease), lumbar 06/12/2013    Past Surgical History:  Procedure Laterality Date  . BACK SURGERY     lower back surgery x 2       Home Medications    Prior to Admission medications   Medication Sig Start Date End Date Taking? Authorizing Provider  doxycycline (VIBRAMYCIN) 100 MG capsule Take 1 capsule (100 mg total) by mouth 2 (two) times daily for 7 days. 02/26/20 03/04/20  Hall-Potvin, [place], PA-C  gabapentin (NEURONTIN) 300 MG capsule Take one Capsule in the Morning and Two Capsules at Bedtime 01/16/20   Bayard Hugger, NP  ibuprofen (ADVIL) 800 MG tablet Take 1 tablet (800 mg total) by mouth every 8 (eight) hours as needed. 01/16/20   Bayard Hugger, NP  morphine (MS CONTIN) 30 MG 12 hr tablet Take 1 tablet (30 mg total) by mouth every 12 (twelve) hours. 02/13/20   Bayard Hugger, NP  Multiple Vitamin (MULTIVITAMIN) tablet Take 1 tablet by mouth daily.    [provider]  OVER THE COUNTER MEDICATION Take 2 tablets by mouth daily. Generic Brand Stool Softner    [provider]  Oxycodone HCl 10 MG TABS Take 1 tablet (10 mg total) by mouth every 6 (six) hours as needed. 02/13/20   Bayard Hugger, NP  tiZANidine (ZANAFLEX) 2 MG tablet Take 1 tablet (2 mg total) by mouth every 8 (eight) hours as needed for muscle spasms. 01/16/20   Bayard Hugger, NP    Family History Family History  Problem Relation Age of Onset  . Hypertension Father  Social History Social History   Tobacco Use  . Smoking status: Never Smoker  . Smokeless tobacco: Never Used  Vaping Use  . Vaping Use: Never used  Substance Use Topics  . Alcohol use: No    Alcohol/week: 0.0 standard drinks  . Drug use: No     Allergies   Patient has no known allergies.   Review of Systems As per HPI   Physical Exam Triage Vital Signs ED Triage Vitals  Enc Vitals Group     BP      Pulse      Resp      Temp      Temp src      SpO2      Weight      Height        Head Circumference      Peak Flow      Pain Score      Pain Loc      Pain Edu?      Excl. in [place]?    No data found.  Updated Vital Signs Pulse 89   Temp 98.3 F (36.8 C) (Oral)   Resp 18   SpO2 98%   Visual Acuity Right Eye Distance:   Left Eye Distance:   Bilateral Distance:    Right Eye Near:   Left Eye Near:    Bilateral Near:     Physical Exam Constitutional:      General: He is not in acute distress. HENT:     Head: Normocephalic and atraumatic.  Eyes:     General: No scleral icterus.    Pupils: Pupils are equal, round, and reactive to light.  Cardiovascular:     Rate and Rhythm: Normal rate.  Pulmonary:     Effort: Pulmonary effort is normal. No respiratory distress.     Breath sounds: No wheezing.  Musculoskeletal:        General: Swelling and tenderness present.  Skin:    Coloration: Skin is not jaundiced or pale.     Findings: Erythema present.     Comments: Right lateral ankle with diffuse erythema, warmth, tenderness.  No open wound, discharge, fluctuance or crepitus.  Neurological:     Mental Status: He is alert and oriented to person, place, and time.      UC Treatments / Results  Labs (all labs ordered are listed, but only abnormal results are displayed) Labs Reviewed - No data to display  EKG   Radiology No results found.  Procedures Procedures (including critical care time)  Medications Ordered in UC Medications - No data to display  Initial Impression / Assessment and Plan / UC Course  I have reviewed the triage vital signs and the nursing notes.  Pertinent labs & imaging results that were available during my care of the patient were reviewed by me and considered in my medical decision making (see chart for details).     Afebrile, nontoxic in office today.  Concerning for pressure wound VS cellulitis.  Will provide antibiotic coverage, discontinue ASO brace.  Applied Ace wrap in office which tolerated well.  Provided  orthopedic contact information for follow-up as needed of ankle pain.  Return precautions discussed, pt verbalized understanding and is agreeable to plan. Final Clinical Impressions(s) / UC Diagnoses   Final diagnoses:  Cellulitis of right ankle     Discharge Instructions     Keep area(s) clean and dry. Apply hot compress / towel for 5-10 minutes 3-5 times daily. Take antibiotic  as prescribed with food - important to complete course. Return for worsening pain, redness, swelling, discharge, fever.    ED Prescriptions    Medication Sig Dispense Auth. Provider   doxycycline (VIBRAMYCIN) 100 MG capsule Take 1 capsule (100 mg total) by mouth 2 (two) times daily for 7 days. 14 capsule Hall-Potvin, [place], PA-C     PDMP not reviewed this encounter.   Hall-Potvin, [place], [place] 02/26/20 1547

## 2020-02-26 NOTE — ED Triage Notes (Signed)
Pt states that he has swelling and tenderness in his right ankle and has been experiencing pain. Pt ambulates with brace/assistive devices.

## 2020-02-26 NOTE — Discharge Instructions (Addendum)
Keep area(s) clean and dry. Apply hot compress / towel for 5-10 minutes 3-5 times daily. Take antibiotic as prescribed with food - important to complete course. Return for worsening pain, redness, swelling, discharge, fever 

## 2020-03-15 ENCOUNTER — Encounter: Payer: Self-pay | Admitting: Registered Nurse

## 2020-03-15 ENCOUNTER — Encounter: Payer: PPO | Attending: Physical Medicine & Rehabilitation | Admitting: Registered Nurse

## 2020-03-15 ENCOUNTER — Other Ambulatory Visit: Payer: Self-pay

## 2020-03-15 VITALS — BP 135/77 | HR 60 | Temp 98.4°F | Ht 73.0 in | Wt 313.6 lb

## 2020-03-15 DIAGNOSIS — M5136 Other intervertebral disc degeneration, lumbar region: Secondary | ICD-10-CM | POA: Diagnosis not present

## 2020-03-15 DIAGNOSIS — G894 Chronic pain syndrome: Secondary | ICD-10-CM | POA: Diagnosis not present

## 2020-03-15 DIAGNOSIS — Z5181 Encounter for therapeutic drug level monitoring: Secondary | ICD-10-CM

## 2020-03-15 DIAGNOSIS — M25571 Pain in right ankle and joints of right foot: Secondary | ICD-10-CM | POA: Diagnosis not present

## 2020-03-15 DIAGNOSIS — M5416 Radiculopathy, lumbar region: Secondary | ICD-10-CM | POA: Insufficient documentation

## 2020-03-15 DIAGNOSIS — M961 Postlaminectomy syndrome, not elsewhere classified: Secondary | ICD-10-CM | POA: Diagnosis not present

## 2020-03-15 DIAGNOSIS — G8929 Other chronic pain: Secondary | ICD-10-CM | POA: Insufficient documentation

## 2020-03-15 DIAGNOSIS — M62830 Muscle spasm of back: Secondary | ICD-10-CM | POA: Insufficient documentation

## 2020-03-15 DIAGNOSIS — Z79891 Long term (current) use of opiate analgesic: Secondary | ICD-10-CM | POA: Diagnosis not present

## 2020-03-15 MED ORDER — OXYCODONE HCL 10 MG PO TABS: 10.0000 mg | ORAL_TABLET | Freq: Four times a day (QID) | ORAL | 0 refills | Status: DC | PRN

## 2020-03-15 MED ORDER — MORPHINE SULFATE ER 30 MG PO TBCR: 30.0000 mg | EXTENDED_RELEASE_TABLET | Freq: Two times a day (BID) | ORAL | 0 refills | Status: DC

## 2020-03-15 NOTE — Progress Notes (Signed)
Subjective:    Patient ID: [name], male    DOB: 1975/06/13, 44 y.o.   MRN: [id number]  HPI: Samuel [name] is a 44 y.o. male who returns for follow up appointment for chronic pain and medication refill. He states his pain is located in his lower back  radiating into his bilateral lower extremities and right ankle pain. He rates his pain 6. His current exercise regime is walking and performing stretching exercises.  [name] Morphine equivalent is 120.00 MME.  Last UDS was Performed on 02/13/2020, it was consistent.    Pain Inventory Average Pain 7 Pain Right Now 6 My pain is constant, sharp, dull, stabbing and aching  In the last 24 hours, has pain interfered with the following? General activity 9 Relation with others 9 Enjoyment of life 9 What TIME of day is your pain at its worst? morning , evening and night Sleep (in general) Fair  Pain is worse with: walking, bending, sitting, inactivity and standing Pain improves with: rest and medication Relief from Meds: 5  Family History  Problem Relation Age of Onset  . Hypertension Father    Social History   Socioeconomic History  . Marital status: Married    Spouse name: Not on file  . Number of children: Not on file  . Years of education: Not on file  . Highest education level: Not on file  Occupational History  . Not on file  Tobacco Use  . Smoking status: Never Smoker  . Smokeless tobacco: Never Used  Vaping Use  . Vaping Use: Never used  Substance and Sexual Activity  . Alcohol use: No    Alcohol/week: 0.0 standard drinks  . Drug use: No  . Sexual activity: Not on file  Other Topics Concern  . Not on file  Social History Narrative  . Not on file   Social Determinants of Health   Financial Resource Strain:   . Difficulty of Paying Living Expenses: Not on file  Food Insecurity:   . Worried About Charity fundraiser in the Last Year: Not on file  . Ran Out of Food in the Last Year: Not on file    Transportation Needs:   . Lack of Transportation (Medical): Not on file  . Lack of Transportation (Non-Medical): Not on file  Physical Activity:   . Days of Exercise per Week: Not on file  . Minutes of Exercise per Session: Not on file  Stress:   . Feeling of Stress : Not on file  Social Connections:   . Frequency of Communication with Friends and Family: Not on file  . Frequency of Social Gatherings with Friends and Family: Not on file  . Attends Religious Services: Not on file  . Active Member of Clubs or Organizations: Not on file  . Attends Archivist Meetings: Not on file  . Marital Status: Not on file   Past Surgical History:  Procedure Laterality Date  . BACK SURGERY     lower back surgery x 2   Past Surgical History:  Procedure Laterality Date  . BACK SURGERY     lower back surgery x 2   Past Medical History:  Diagnosis Date  . Arthritis    "some in my back"  . GERD (gastroesophageal reflux disease)   . Headache(784.0)    BP 135/77   Pulse 60   Temp 98.4 F (36.9 C)   Ht 6\' 1"  (1.854 m)   Wt (!) 313 lb  9.6 oz (142.2 kg)   SpO2 98%   BMI 41.37 kg/m   Opioid Risk Score:   Fall Risk Score:  `1  Depression screen PHQ 2/9  Depression screen [facility] 2/9 02/13/2020 01/16/2020 12/22/2019 11/16/2019 06/13/2019 03/13/2019 06/14/2018  Decreased Interest 1 2 1  0 1 1 0  Down, Depressed, Hopeless 1 2 1  0 1 1 0  PHQ - 2 Score 2 4 2  0 2 2 0  Altered sleeping - - - - - - -  Tired, decreased energy - - - - - - -  Change in appetite - - - - - - -  Feeling bad or failure about yourself  - - - - - - -  Trouble concentrating - - - - - - -  Moving slowly or fidgety/restless - - - - - - -  Suicidal thoughts - - - - - - -  PHQ-9 Score - - - - - - -  Difficult doing work/chores - - - - - - -  Some recent data might be hidden    Review of Systems  Musculoskeletal: Positive for back pain and gait problem.  Neurological: Positive for weakness.  All other systems  reviewed and are negative.      Objective:   Physical Exam Vitals and nursing note reviewed.  Constitutional:      Appearance: Normal appearance.  Cardiovascular:     Rate and Rhythm: Normal rate and regular rhythm.     Pulses: Normal pulses.     Heart sounds: Normal heart sounds.  Pulmonary:     Effort: Pulmonary effort is normal.     Breath sounds: Normal breath sounds.  Musculoskeletal:     Cervical back: Normal range of motion and neck supple.     Comments: Normal Muscle Bulk and Muscle Testing Reveals: Upper Extremities: Decreased ROM 90 Degrees  and Muscle Strength 5/5  Lumbar Paraspinal Tenderness: L-3-L-5 Lower Extremities: Decreased ROM and Muscle Strength 5/5  Bilateral Lower Extremity Flexion Produces Pain into her Lumbar Arises from Table Slowly using cane for support Antalgic Gait   Skin:    General: Skin is warm and dry.  Neurological:     Mental Status: He is alert and oriented to person, place, and time.  Psychiatric:        Mood and Affect: Mood normal.        Behavior: Behavior normal.           Assessment & Plan:  1. 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 He has lumbar degenerative disc disease. Continuecurrent medication regimen withGabapentin, Continue current Medication regimen. S/P Left Lumbar Facet Radiofrequency Ablation with sedation, on06/01/2021by Dr. Dossie Arbour with good results notedand S/PRightRFA withDr. Judi Cong [id number]. 03/15/2020. Refilled: MS Contin30 mg one tablet every 12 hours #60 and Oxycodone 10mg  one tablet every 6 hours as needed for pain #120. We will continue the opioid monitoring program, this consists of regular clinic visits, examinations, urine drug screen, pill counts as well as use of [place] Controlled Substance Reporting system. A 12 month History has been reviewed on the [place] Controlled Substance Reporting Systemon 03/15/2020. 2. Sacroiliac  Dysfunction: Continue with current medication, heat and exercise regime.03/15/2020 3. Muscle Spasm:Continue Tizanidineas needed.Continueto Monitor.03/15/2020 4. Chronic Pain Syndrome: Continue MS Contin, Oxycodone andIbuprofen as needed for pain. Continue to Monitor.03/15/2020. 5. Left Greater Trochanteric Tenderness: No complaints today. Left Hip Osteoarthritis: S/P left intra-articular hip injectionwith 8 days of relief Mr. Watkinson reports. Continue with alternating Ice  and Heat Therapy. Continue to monitor.03/15/2020. 6. Right Ankle Pain: He was seen at Urgent Care and ED : Note was reviewed. Mr. Salvia was encouraged to obtain a PCP. He verbalizes understanding.  6minutes of face to face patient care time was spent during this visit. All questions were encouraged and answered.  F/U in 1 Month

## 2020-04-03 DIAGNOSIS — K3189 Other diseases of stomach and duodenum: Secondary | ICD-10-CM | POA: Diagnosis not present

## 2020-04-03 DIAGNOSIS — R188 Other ascites: Secondary | ICD-10-CM | POA: Diagnosis not present

## 2020-04-03 DIAGNOSIS — D131 Benign neoplasm of stomach: Secondary | ICD-10-CM | POA: Diagnosis not present

## 2020-04-03 DIAGNOSIS — D649 Anemia, unspecified: Secondary | ICD-10-CM | POA: Diagnosis not present

## 2020-04-03 DIAGNOSIS — R112 Nausea with vomiting, unspecified: Secondary | ICD-10-CM | POA: Diagnosis not present

## 2020-04-03 DIAGNOSIS — K766 Portal hypertension: Secondary | ICD-10-CM | POA: Diagnosis not present

## 2020-04-03 DIAGNOSIS — D62 Acute posthemorrhagic anemia: Secondary | ICD-10-CM | POA: Diagnosis not present

## 2020-04-03 DIAGNOSIS — Z791 Long term (current) use of non-steroidal anti-inflammatories (NSAID): Secondary | ICD-10-CM | POA: Diagnosis not present

## 2020-04-03 DIAGNOSIS — I8510 Secondary esophageal varices without bleeding: Secondary | ICD-10-CM | POA: Diagnosis not present

## 2020-04-03 DIAGNOSIS — I8500 Esophageal varices without bleeding: Secondary | ICD-10-CM | POA: Diagnosis not present

## 2020-04-03 DIAGNOSIS — K219 Gastro-esophageal reflux disease without esophagitis: Secondary | ICD-10-CM | POA: Diagnosis not present

## 2020-04-03 DIAGNOSIS — R161 Splenomegaly, not elsewhere classified: Secondary | ICD-10-CM | POA: Diagnosis not present

## 2020-04-03 DIAGNOSIS — K8689 Other specified diseases of pancreas: Secondary | ICD-10-CM | POA: Diagnosis not present

## 2020-04-03 DIAGNOSIS — E1165 Type 2 diabetes mellitus with hyperglycemia: Secondary | ICD-10-CM | POA: Diagnosis not present

## 2020-04-03 DIAGNOSIS — D689 Coagulation defect, unspecified: Secondary | ICD-10-CM | POA: Diagnosis not present

## 2020-04-03 DIAGNOSIS — K920 Hematemesis: Secondary | ICD-10-CM | POA: Diagnosis not present

## 2020-04-03 DIAGNOSIS — Z79891 Long term (current) use of opiate analgesic: Secondary | ICD-10-CM | POA: Diagnosis not present

## 2020-04-03 DIAGNOSIS — D61818 Other pancytopenia: Secondary | ICD-10-CM | POA: Diagnosis not present

## 2020-04-03 DIAGNOSIS — E785 Hyperlipidemia, unspecified: Secondary | ICD-10-CM | POA: Diagnosis not present

## 2020-04-03 DIAGNOSIS — M549 Dorsalgia, unspecified: Secondary | ICD-10-CM | POA: Diagnosis not present

## 2020-04-03 DIAGNOSIS — K3182 Dieulafoy lesion (hemorrhagic) of stomach and duodenum: Secondary | ICD-10-CM | POA: Diagnosis not present

## 2020-04-03 DIAGNOSIS — K828 Other specified diseases of gallbladder: Secondary | ICD-10-CM | POA: Diagnosis not present

## 2020-04-03 DIAGNOSIS — K922 Gastrointestinal hemorrhage, unspecified: Secondary | ICD-10-CM | POA: Diagnosis not present

## 2020-04-03 DIAGNOSIS — D500 Iron deficiency anemia secondary to blood loss (chronic): Secondary | ICD-10-CM | POA: Diagnosis not present

## 2020-04-03 DIAGNOSIS — G8929 Other chronic pain: Secondary | ICD-10-CM | POA: Diagnosis not present

## 2020-04-03 DIAGNOSIS — K7460 Unspecified cirrhosis of liver: Secondary | ICD-10-CM | POA: Diagnosis not present

## 2020-04-04 DIAGNOSIS — K7460 Unspecified cirrhosis of liver: Secondary | ICD-10-CM | POA: Diagnosis not present

## 2020-04-04 DIAGNOSIS — K3182 Dieulafoy lesion (hemorrhagic) of stomach and duodenum: Secondary | ICD-10-CM | POA: Diagnosis not present

## 2020-04-04 DIAGNOSIS — I8510 Secondary esophageal varices without bleeding: Secondary | ICD-10-CM | POA: Diagnosis not present

## 2020-04-16 ENCOUNTER — Encounter: Payer: Self-pay | Admitting: Registered Nurse

## 2020-04-16 ENCOUNTER — Other Ambulatory Visit: Payer: Self-pay

## 2020-04-16 ENCOUNTER — Encounter: Payer: PPO | Attending: Physical Medicine & Rehabilitation | Admitting: Registered Nurse

## 2020-04-16 VITALS — BP 147/76 | HR 82 | Temp 98.8°F | Ht 73.0 in | Wt 293.4 lb

## 2020-04-16 DIAGNOSIS — M62830 Muscle spasm of back: Secondary | ICD-10-CM | POA: Diagnosis not present

## 2020-04-16 DIAGNOSIS — M5416 Radiculopathy, lumbar region: Secondary | ICD-10-CM | POA: Insufficient documentation

## 2020-04-16 DIAGNOSIS — M961 Postlaminectomy syndrome, not elsewhere classified: Secondary | ICD-10-CM | POA: Diagnosis not present

## 2020-04-16 DIAGNOSIS — M5136 Other intervertebral disc degeneration, lumbar region: Secondary | ICD-10-CM | POA: Diagnosis not present

## 2020-04-16 DIAGNOSIS — Z79891 Long term (current) use of opiate analgesic: Secondary | ICD-10-CM | POA: Insufficient documentation

## 2020-04-16 DIAGNOSIS — Z5181 Encounter for therapeutic drug level monitoring: Secondary | ICD-10-CM | POA: Diagnosis not present

## 2020-04-16 DIAGNOSIS — G894 Chronic pain syndrome: Secondary | ICD-10-CM | POA: Diagnosis not present

## 2020-04-16 MED ORDER — OXYCODONE HCL 10 MG PO TABS: 10.0000 mg | ORAL_TABLET | Freq: Four times a day (QID) | ORAL | 0 refills | Status: DC | PRN

## 2020-04-16 MED ORDER — MORPHINE SULFATE ER 30 MG PO TBCR: 30.0000 mg | EXTENDED_RELEASE_TABLET | Freq: Two times a day (BID) | ORAL | 0 refills | Status: DC

## 2020-04-16 NOTE — Progress Notes (Signed)
Subjective:    Patient ID: Samuel [name], male    DOB: Jun 11, 1975, 44 y.o.   MRN: [id number]  HPI: Samuel [name] is a 44 y.o. male who returns for follow up appointment for chronic pain and medication refill. He states his pain is located in his lower back radiating into his bilateral lower extremities.  He rates his pain 6. His current exercise regime is walking.  Samuel [name] states he was hospitalized at [facility] from 04/03/2020 to 04/06/2020, will request discharge summary, he verbalizes understanding.   Educated Samuel [name] again on finding a PCP, he states he will be working on finding a PCP, he was instructed to call his insurance company as well, he verbalizes understanding.   Samuel [name] Morphine equivalent is 112.00 MME.    Last UDS was Performed on 02/13/2020, it was consistent.   Pain Inventory Average Pain 6 Pain Right Now 6 My pain is sharp, dull, stabbing and aching  In the last 24 hours, has pain interfered with the following? General activity 9 Relation with others 9 Enjoyment of life 9 What TIME of day is your pain at its worst? morning , evening and night Sleep (in general) NA  Pain is worse with: walking, bending, sitting, inactivity, standing and some activites Pain improves with: rest and medication Relief from Meds: 5  Family History  Problem Relation Age of Onset  . Hypertension Father    Social History   Socioeconomic History  . Marital status: Married    Spouse name: Not on file  . Number of children: Not on file  . Years of education: Not on file  . Highest education level: Not on file  Occupational History  . Not on file  Tobacco Use  . Smoking status: Never Smoker  . Smokeless tobacco: Never Used  Vaping Use  . Vaping Use: Never used  Substance and Sexual Activity  . Alcohol use: No    Alcohol/week: 0.0 standard drinks  . Drug use: No  . Sexual activity: Not on file  Other Topics Concern  . Not on file  Social History  Narrative  . Not on file   Social Determinants of Health   Financial Resource Strain:   . Difficulty of Paying Living Expenses: Not on file  Food Insecurity:   . Worried About Charity fundraiser in the Last Year: Not on file  . Ran Out of Food in the Last Year: Not on file  Transportation Needs:   . Lack of Transportation (Medical): Not on file  . Lack of Transportation (Non-Medical): Not on file  Physical Activity:   . Days of Exercise per Week: Not on file  . Minutes of Exercise per Session: Not on file  Stress:   . Feeling of Stress : Not on file  Social Connections:   . Frequency of Communication with Friends and Family: Not on file  . Frequency of Social Gatherings with Friends and Family: Not on file  . Attends Religious Services: Not on file  . Active Member of Clubs or Organizations: Not on file  . Attends Archivist Meetings: Not on file  . Marital Status: Not on file   Past Surgical History:  Procedure Laterality Date  . BACK SURGERY     lower back surgery x 2   Past Surgical History:  Procedure Laterality Date  . BACK SURGERY     lower back surgery x 2   Past Medical History:  Diagnosis Date  .  Arthritis    "some in my back"  . GERD (gastroesophageal reflux disease)   . Headache(784.0)    BP (!) 147/76   Pulse 82   Temp 98.8 F (37.1 C)   Ht 6\' 1"  (1.854 m)   Wt 293 lb 6.4 oz (133.1 kg)   SpO2 98%   BMI 38.71 kg/m   Opioid Risk Score:   Fall Risk Score:  `1  Depression screen PHQ 2/9  Depression screen [facility] 2/9 04/16/2020 02/13/2020 01/16/2020 12/22/2019 11/16/2019 06/13/2019 03/13/2019  Decreased Interest 2 1 2 1  0 1 1  Down, Depressed, Hopeless 2 1 2 1  0 1 1  PHQ - 2 Score 4 2 4 2  0 2 2  Altered sleeping - - - - - - -  Tired, decreased energy - - - - - - -  Change in appetite - - - - - - -  Feeling bad or failure about yourself  - - - - - - -  Trouble concentrating - - - - - - -  Moving slowly or fidgety/restless - - - - - - -    Suicidal thoughts - - - - - - -  PHQ-9 Score - - - - - - -  Difficult doing work/chores - - - - - - -  Some recent data might be hidden    Review of Systems  Constitutional: Negative.   HENT: Negative.   Eyes: Negative.   Cardiovascular: Negative.   Gastrointestinal:       Was hospitalized for GI bleed.  Had upper GI study  Endocrine:       Newly diagnosed with diabetes  Musculoskeletal: Positive for arthralgias, back pain and gait problem.       Spasms  Skin: Negative.   Allergic/Immunologic: Negative.   Neurological: Positive for weakness.  Hematological: Negative.   Psychiatric/Behavioral: Positive for dysphoric mood.  All other systems reviewed and are negative.      Objective:   Physical Exam Vitals and nursing note reviewed.  Constitutional:      Appearance: Normal appearance. He is obese.  Cardiovascular:     Rate and Rhythm: Normal rate and regular rhythm.     Pulses: Normal pulses.     Heart sounds: Normal heart sounds.  Pulmonary:     Effort: Pulmonary effort is normal.     Breath sounds: Normal breath sounds.  Musculoskeletal:     Cervical back: Normal range of motion and neck supple.     Comments: Normal Muscle Bulk and Muscle Testing Reveals:  Upper Extremities: Full ROM and Muscle Strength 5/5  Lumbar Hypersensitivity Lower Extremities: Decreased ROM and Muscle Strength 5/5 Arises from Table Slowly Antalgic  Gait   Skin:    General: Skin is warm and dry.  Neurological:     Mental Status: He is alert and oriented to person, place, and time.  Psychiatric:        Mood and Affect: Mood normal.        Behavior: Behavior normal.           Assessment & Plan:  1. 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 He has lumbar degenerative disc disease. Continuecurrent medication regimen withGabapentin, Continue current Medication regimen. S/P Left Lumbar Facet Radiofrequency Ablation with sedation, on06/01/2021by Dr.  Dossie Arbour with good results notedand S/PRightRFA withDr. Judi Cong [id number]. 04/16/2020. Refilled: MS Contin30 mg one tablet every 12 hours #60 and Oxycodone 10mg  one tablet every 6 hours as needed for pain #120.  We will continue the opioid monitoring program, this consists of regular clinic visits, examinations, urine drug screen, pill counts as well as use of [place] Controlled Substance Reporting system. A 12 month History has been reviewed on the [place] Controlled Substance Reporting Systemon 04/16/2020. 2. Sacroiliac Dysfunction: Continue with current medication, heat and exercise regime.04/16/2020 3. Muscle Spasm:Continue Tizanidineas needed.Continueto Monitor.04/16/2020 4. Chronic Pain Syndrome: Continue MS Contin and Oxycodone as needed for pain. Continue to Monitor.04/16/2020. 5. Left Greater Trochanteric Tenderness:No complaints today.Left Hip Osteoarthritis: S/P left intra-articular hip injectionwith 8 days of relief Samuel [name] reports. Continue with alternating Ice and Heat Therapy. Continue to monitor.04/16/2020.   F/U in 1 Month

## 2020-04-25 DIAGNOSIS — D649 Anemia, unspecified: Secondary | ICD-10-CM | POA: Diagnosis not present

## 2020-04-29 DIAGNOSIS — K920 Hematemesis: Secondary | ICD-10-CM | POA: Diagnosis not present

## 2020-04-29 DIAGNOSIS — D131 Benign neoplasm of stomach: Secondary | ICD-10-CM | POA: Diagnosis not present

## 2020-04-29 DIAGNOSIS — K7460 Unspecified cirrhosis of liver: Secondary | ICD-10-CM | POA: Diagnosis not present

## 2020-05-03 DIAGNOSIS — Z6835 Body mass index (BMI) 35.0-35.9, adult: Secondary | ICD-10-CM | POA: Diagnosis not present

## 2020-05-03 DIAGNOSIS — G8929 Other chronic pain: Secondary | ICD-10-CM | POA: Insufficient documentation

## 2020-05-03 DIAGNOSIS — E669 Obesity, unspecified: Secondary | ICD-10-CM | POA: Insufficient documentation

## 2020-05-03 DIAGNOSIS — E119 Type 2 diabetes mellitus without complications: Secondary | ICD-10-CM | POA: Insufficient documentation

## 2020-05-03 DIAGNOSIS — K7581 Nonalcoholic steatohepatitis (NASH): Secondary | ICD-10-CM | POA: Diagnosis not present

## 2020-05-03 DIAGNOSIS — D500 Iron deficiency anemia secondary to blood loss (chronic): Secondary | ICD-10-CM | POA: Diagnosis not present

## 2020-05-08 DIAGNOSIS — Z713 Dietary counseling and surveillance: Secondary | ICD-10-CM | POA: Diagnosis not present

## 2020-05-08 DIAGNOSIS — E119 Type 2 diabetes mellitus without complications: Secondary | ICD-10-CM | POA: Diagnosis not present

## 2020-05-08 IMAGING — CR DG THORACIC SPINE 2V
3 series · 3 of 3 positions shown · non-contrast
Comparison: None.

CLINICAL DATA: Thoracic back pain for 2 months.

EXAM:
THORACIC SPINE 2 VIEWS

[t thoracic spine ap]
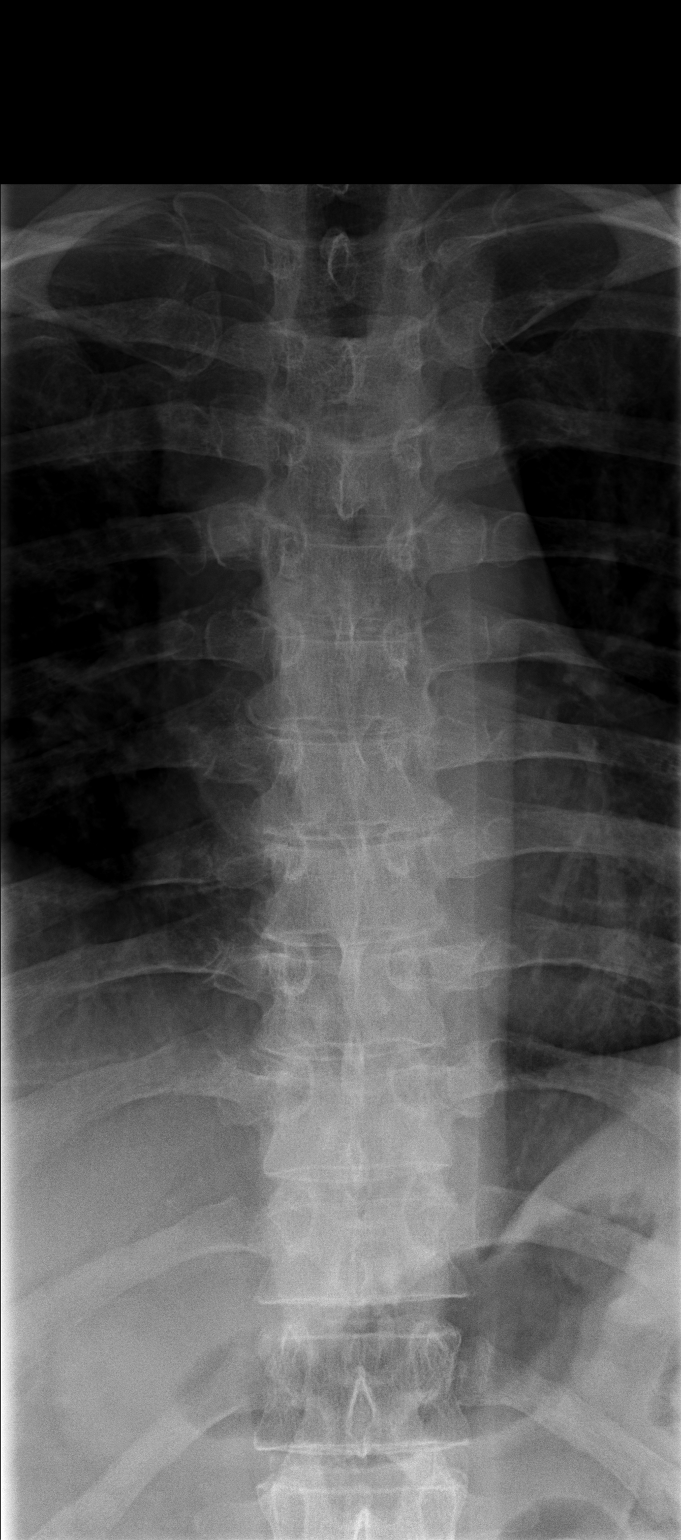

[t thoracic spine lat (1 of 2)]
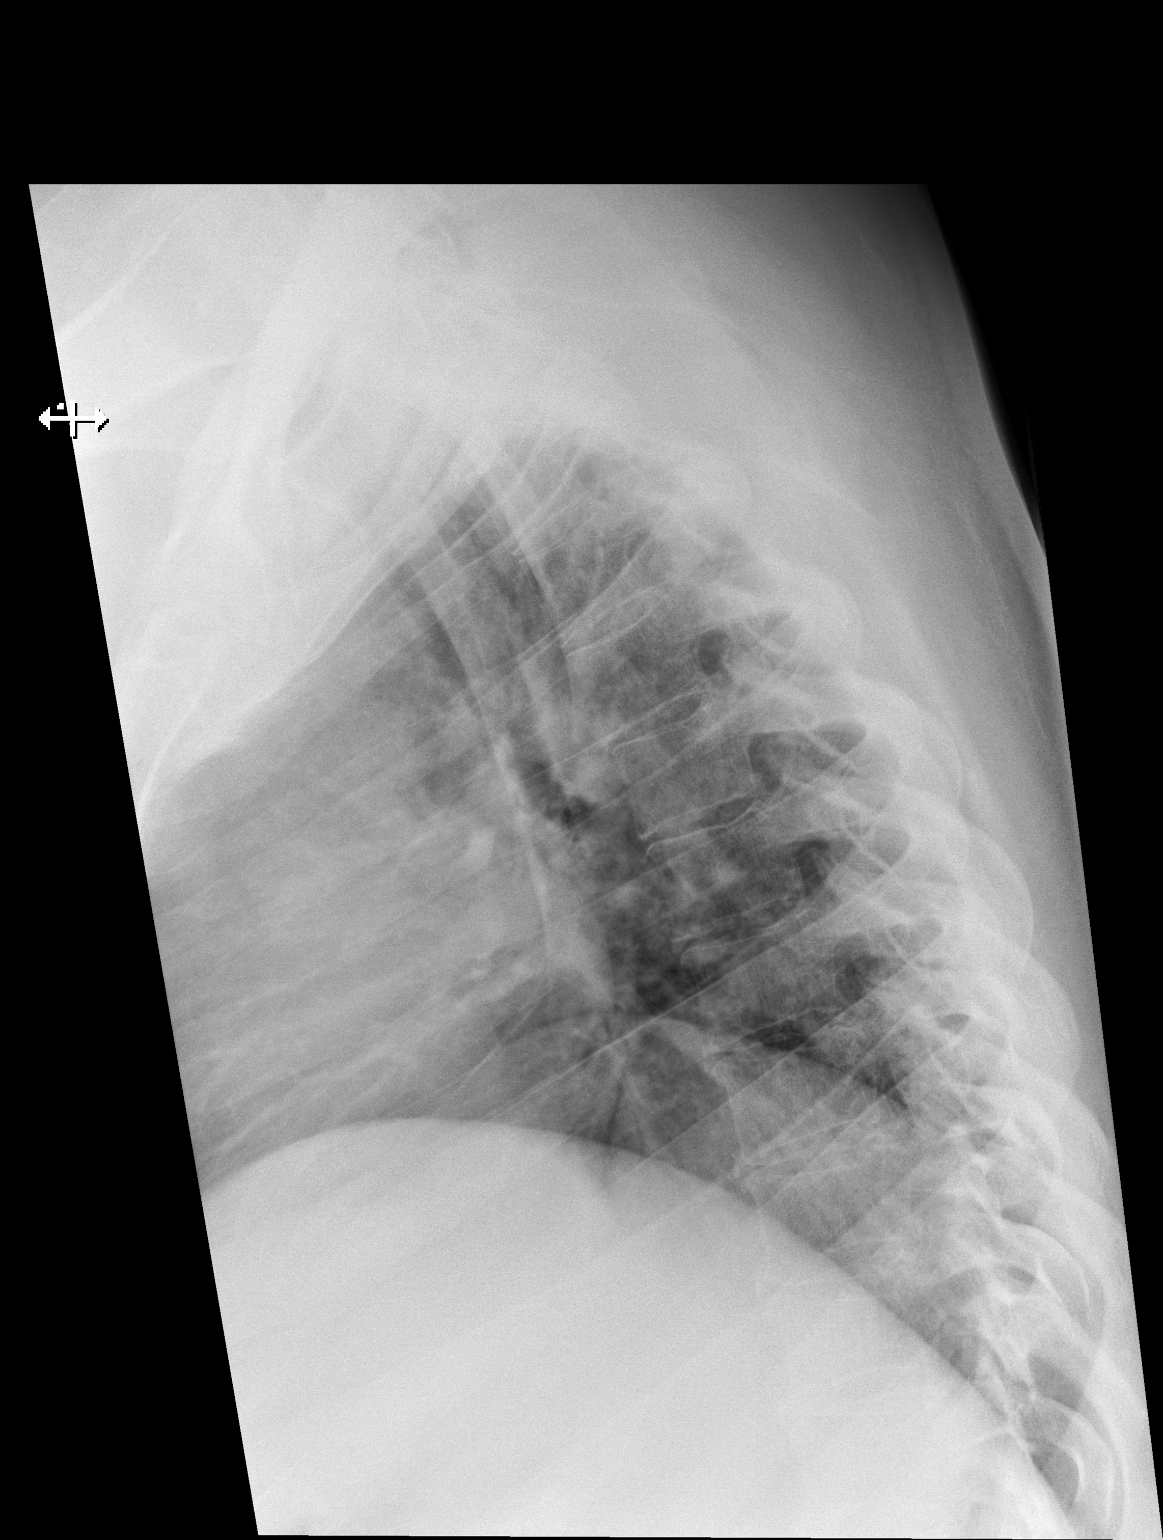

[t thoracic spine lat (2 of 2)]
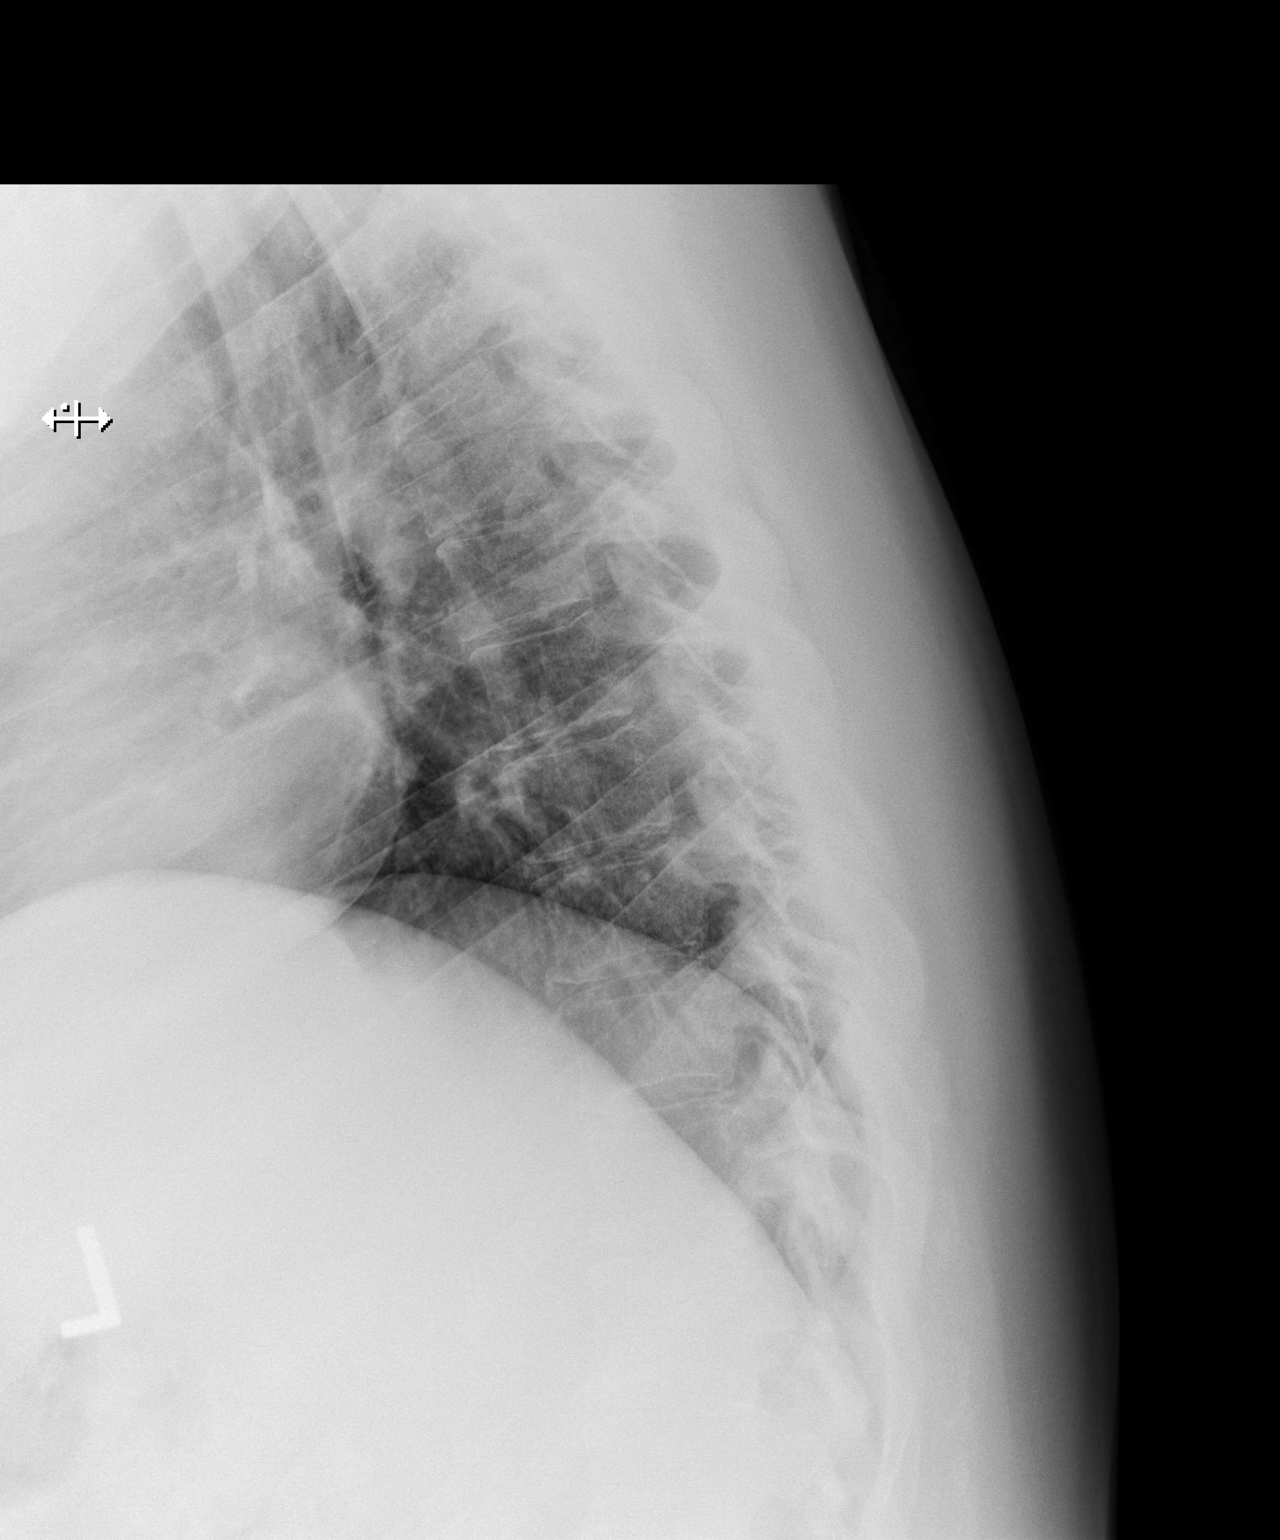

[3 of 3 positions shown; findings below may reference images not displayed]

FINDINGS: There is no evidence of thoracic spine fracture. Alignment is
normal. No other significant bone abnormalities are identified.
IMPRESSION: Negative.

## 2020-05-14 ENCOUNTER — Encounter: Payer: PPO | Attending: Physical Medicine & Rehabilitation | Admitting: Registered Nurse

## 2020-05-14 ENCOUNTER — Encounter: Payer: Self-pay | Admitting: Registered Nurse

## 2020-05-14 ENCOUNTER — Other Ambulatory Visit: Payer: Self-pay

## 2020-05-14 VITALS — BP 117/71 | HR 63 | Temp 98.1°F | Ht 73.0 in | Wt 288.8 lb

## 2020-05-14 DIAGNOSIS — Z5181 Encounter for therapeutic drug level monitoring: Secondary | ICD-10-CM | POA: Insufficient documentation

## 2020-05-14 DIAGNOSIS — G894 Chronic pain syndrome: Secondary | ICD-10-CM | POA: Diagnosis not present

## 2020-05-14 DIAGNOSIS — M961 Postlaminectomy syndrome, not elsewhere classified: Secondary | ICD-10-CM | POA: Insufficient documentation

## 2020-05-14 DIAGNOSIS — M62830 Muscle spasm of back: Secondary | ICD-10-CM | POA: Insufficient documentation

## 2020-05-14 DIAGNOSIS — Z79891 Long term (current) use of opiate analgesic: Secondary | ICD-10-CM | POA: Insufficient documentation

## 2020-05-14 DIAGNOSIS — M5416 Radiculopathy, lumbar region: Secondary | ICD-10-CM | POA: Insufficient documentation

## 2020-05-14 DIAGNOSIS — M5136 Other intervertebral disc degeneration, lumbar region: Secondary | ICD-10-CM | POA: Diagnosis not present

## 2020-05-14 MED ORDER — OXYCODONE HCL 10 MG PO TABS: 10.0000 mg | ORAL_TABLET | Freq: Four times a day (QID) | ORAL | 0 refills | Status: DC | PRN

## 2020-05-14 MED ORDER — MORPHINE SULFATE ER 30 MG PO TBCR: 30.0000 mg | EXTENDED_RELEASE_TABLET | Freq: Two times a day (BID) | ORAL | 0 refills | Status: DC

## 2020-05-14 NOTE — Progress Notes (Signed)
Subjective:    Patient ID: Samuel [name], male    DOB: [birth year], 44 y.o.   MRN: [id number]  HPI: Samuel [name] is a 44 y.o. male who returns for follow up appointment for chronic pain and medication refill. He states his pain is located in his lower back radiating into his bilateral lower extremities. He rates his pain 7. His current exercise regime is walking and performing stretching exercises.  Mr. Meddings Morphine equivalent is 120.00  MME.   Last UDS was Performed on 02/13/2020, it was consistent.    Pain Inventory Average Pain 6 Pain Right Now 7 My pain is constant, sharp, dull, stabbing and aching  In the last 24 hours, has pain interfered with the following? General activity 9 Relation with others 9 Enjoyment of life 9 What TIME of day is your pain at its worst? morning , evening and night Sleep (in general) Fair  Pain is worse with: walking, bending, sitting, inactivity and standing Pain improves with: rest and medication Relief from Meds: 5  Family History  Problem Relation Age of Onset  . Hypertension Father    Social History   Socioeconomic History  . Marital status: Married    Spouse name: Not on file  . Number of children: Not on file  . Years of education: Not on file  . Highest education level: Not on file  Occupational History  . Not on file  Tobacco Use  . Smoking status: Never Smoker  . Smokeless tobacco: Never Used  Vaping Use  . Vaping Use: Never used  Substance and Sexual Activity  . Alcohol use: No    Alcohol/week: 0.0 standard drinks  . Drug use: No  . Sexual activity: Not on file  Other Topics Concern  . Not on file  Social History Narrative  . Not on file   Social Determinants of Health   Financial Resource Strain: Not on file  Food Insecurity: Not on file  Transportation Needs: Not on file  Physical Activity: Not on file  Stress: Not on file  Social Connections: Not on file   Past Surgical History:  Procedure  Laterality Date  . BACK SURGERY     lower back surgery x 2   Past Surgical History:  Procedure Laterality Date  . BACK SURGERY     lower back surgery x 2   Past Medical History:  Diagnosis Date  . Arthritis    "some in my back"  . GERD (gastroesophageal reflux disease)   . Headache(784.0)    There were no vitals taken for this visit.  Opioid Risk Score:   Fall Risk Score:  `1  Depression screen PHQ 2/9  Depression screen [facility] 2/9 04/16/2020 02/13/2020 01/16/2020 12/22/2019 11/16/2019 06/13/2019 03/13/2019  Decreased Interest 2 1 2 1  0 1 1  Down, Depressed, Hopeless 2 1 2 1  0 1 1  PHQ - 2 Score 4 2 4 2  0 2 2  Altered sleeping - - - - - - -  Tired, decreased energy - - - - - - -  Change in appetite - - - - - - -  Feeling bad or failure about yourself  - - - - - - -  Trouble concentrating - - - - - - -  Moving slowly or fidgety/restless - - - - - - -  Suicidal thoughts - - - - - - -  PHQ-9 Score - - - - - - -  Difficult doing work/chores - - - - - - -  Some recent data might be hidden   Review of Systems  Musculoskeletal: Positive for back pain and gait problem.  All other systems reviewed and are negative.      Objective:   Physical Exam Vitals and nursing note reviewed.  Constitutional:      Appearance: Normal appearance.  Cardiovascular:     Rate and Rhythm: Normal rate and regular rhythm.     Pulses: Normal pulses.     Heart sounds: Normal heart sounds.  Pulmonary:     Effort: Pulmonary effort is normal.     Breath sounds: Normal breath sounds.  Musculoskeletal:     Cervical back: Normal range of motion and neck supple.     Comments: Normal Muscle Bulk and Muscle Testing Reveals:  Upper Extremities: Full ROM and Muscle Strength 5/5 Thoracic Paraspinal Tenderness: T-1-T-2 Lumbar Hypersensitivity Lower Extremities: Decreased ROM and Muscle Strength 5/5 Left Lower Extremity Flexion Produces Pain into his lumbar ( Mainly Right Side) Arises from Table  Slowly Antalgic Gait   Skin:    General: Skin is warm and dry.  Neurological:     Mental Status: He is alert and oriented to person, place, and time.  Psychiatric:        Mood and Affect: Mood normal.        Behavior: Behavior normal.           Assessment & Plan:  1. 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 He has lumbar degenerative disc disease. Continuecurrent medication regimen withGabapentin, Continue current Medication regimen. S/P Left Lumbar Facet Radiofrequency Ablation with sedation, on06/01/2021by Dr. Dossie Arbour with good results notedand S/PRightRFA withDr. Judi Cong June24,2021.05/14/2020. Refilled: MS Contin30 mg one tablet every 12 hours #60 and Oxycodone 10mg  one tablet every 6 hours as needed for pain #120. We will continue the opioid monitoring program, this consists of regular clinic visits, examinations, urine drug screen, pill counts as well as use of [place] Controlled Substance Reporting system. A 12 month History has been reviewed on the [place] Controlled Substance Reporting Systemon12/21/2021. 2. Sacroiliac Dysfunction: Continue with current medication, heat and exercise regime.05/14/2020 3. Muscle Spasm:Continue Tizanidineas needed.Continueto Monitor.05/14/2020 4. Chronic Pain Syndrome: Continue MS Contin and Oxycodone as needed for pain. Continue to Monitor.05/14/2020. 5. Left Greater Trochanteric Tenderness:No complaints today.Left Hip Osteoarthritis: S/P left intra-articular hip injectionwith 8 days of relief Mr. Simonis reports. Continue with alternating Ice and Heat Therapy. Continue to monitor.05/14/2020.   F/U in 1 Month

## 2020-06-13 DIAGNOSIS — D649 Anemia, unspecified: Secondary | ICD-10-CM | POA: Diagnosis not present

## 2020-06-13 DIAGNOSIS — K7460 Unspecified cirrhosis of liver: Secondary | ICD-10-CM | POA: Diagnosis not present

## 2020-06-13 DIAGNOSIS — D131 Benign neoplasm of stomach: Secondary | ICD-10-CM | POA: Diagnosis not present

## 2020-06-13 DIAGNOSIS — K920 Hematemesis: Secondary | ICD-10-CM | POA: Diagnosis not present

## 2020-06-18 ENCOUNTER — Encounter: Payer: PPO | Attending: Physical Medicine & Rehabilitation | Admitting: Registered Nurse

## 2020-06-18 ENCOUNTER — Other Ambulatory Visit: Payer: Self-pay

## 2020-06-18 ENCOUNTER — Encounter: Payer: Self-pay | Admitting: Registered Nurse

## 2020-06-18 VITALS — BP 117/70 | HR 65 | Temp 98.7°F | Ht 73.0 in | Wt 272.0 lb

## 2020-06-18 DIAGNOSIS — M5136 Other intervertebral disc degeneration, lumbar region: Secondary | ICD-10-CM | POA: Insufficient documentation

## 2020-06-18 DIAGNOSIS — M62830 Muscle spasm of back: Secondary | ICD-10-CM | POA: Insufficient documentation

## 2020-06-18 DIAGNOSIS — Z5181 Encounter for therapeutic drug level monitoring: Secondary | ICD-10-CM | POA: Diagnosis not present

## 2020-06-18 DIAGNOSIS — G894 Chronic pain syndrome: Secondary | ICD-10-CM | POA: Diagnosis not present

## 2020-06-18 DIAGNOSIS — M961 Postlaminectomy syndrome, not elsewhere classified: Secondary | ICD-10-CM | POA: Diagnosis not present

## 2020-06-18 DIAGNOSIS — M5416 Radiculopathy, lumbar region: Secondary | ICD-10-CM | POA: Diagnosis not present

## 2020-06-18 DIAGNOSIS — Z79899 Other long term (current) drug therapy: Secondary | ICD-10-CM | POA: Diagnosis not present

## 2020-06-18 MED ORDER — MORPHINE SULFATE ER 30 MG PO TBCR: 30.0000 mg | EXTENDED_RELEASE_TABLET | Freq: Two times a day (BID) | ORAL | 0 refills | Status: DC

## 2020-06-18 MED ORDER — OXYCODONE HCL 10 MG PO TABS: 10.0000 mg | ORAL_TABLET | Freq: Four times a day (QID) | ORAL | 0 refills | Status: DC | PRN

## 2020-06-18 NOTE — Progress Notes (Signed)
Subjective:    Patient ID: Samuel [name], male    DOB: 24-Apr-1976, 45 y.o.   MRN: [id number]  HPI: Samuel [name] is a 45 y.o. male who returns for follow up appointment for chronic pain and medication refill. He states his pain is located in his lower back radiating into his bilateral lower extremities. He rates his pain 6. His current exercise regime is walking and performing stretching exercises.  Samuel [name] Morphine equivalent is 120.00  MME.  UDS ordered today.    Pain Inventory Average Pain 6 Pain Right Now 6 My pain is constant, sharp, burning, dull, stabbing and aching  In the last 24 hours, has pain interfered with the following? General activity 9 Relation with others 9 Enjoyment of life 9 What TIME of day is your pain at its worst? morning , evening and night Sleep (in general) Fair  Pain is worse with: walking, bending, sitting, inactivity and standing Pain improves with: rest and medication Relief from Meds: 5  Family History  Problem Relation Age of Onset  . Hypertension Father    Social History   Socioeconomic History  . Marital status: Married    Spouse name: Not on file  . Number of children: Not on file  . Years of education: Not on file  . Highest education level: Not on file  Occupational History  . Not on file  Tobacco Use  . Smoking status: Never Smoker  . Smokeless tobacco: Never Used  Vaping Use  . Vaping Use: Never used  Substance and Sexual Activity  . Alcohol use: No    Alcohol/week: 0.0 standard drinks  . Drug use: No  . Sexual activity: Not on file  Other Topics Concern  . Not on file  Social History Narrative  . Not on file   Social Determinants of Health   Financial Resource Strain: Not on file  Food Insecurity: Not on file  Transportation Needs: Not on file  Physical Activity: Not on file  Stress: Not on file  Social Connections: Not on file   Past Surgical History:  Procedure Laterality Date  . BACK SURGERY      lower back surgery x 2   Past Surgical History:  Procedure Laterality Date  . BACK SURGERY     lower back surgery x 2   Past Medical History:  Diagnosis Date  . Arthritis    "some in my back"  . GERD (gastroesophageal reflux disease)   . Headache(784.0)    BP 117/70   Pulse 65   Temp 98.7 F (37.1 C)   Ht 6\' 1"  (1.854 m)   Wt 272 lb (123.4 kg)   SpO2 100%   BMI 35.89 kg/m   Opioid Risk Score:   Fall Risk Score:  `1  Depression screen PHQ 2/9  Depression screen [facility] 2/9 05/14/2020 04/16/2020 02/13/2020 01/16/2020 12/22/2019 11/16/2019 06/13/2019  Decreased Interest 0 2 1 2 1  0 1  Down, Depressed, Hopeless 0 2 1 2 1  0 1  PHQ - 2 Score 0 4 2 4 2  0 2  Altered sleeping - - - - - - -  Tired, decreased energy - - - - - - -  Change in appetite - - - - - - -  Feeling bad or failure about yourself  - - - - - - -  Trouble concentrating - - - - - - -  Moving slowly or fidgety/restless - - - - - - -  Suicidal thoughts - - - - - - -  PHQ-9 Score - - - - - - -  Difficult doing work/chores - - - - - - -  Some recent data might be hidden     Review of Systems  Constitutional: Negative.   HENT: Negative.   Eyes: Negative.   Respiratory: Negative.   Cardiovascular: Negative.   Gastrointestinal: Negative.   Endocrine: Negative.   Genitourinary: Negative.   Musculoskeletal: Positive for back pain.  Skin: Negative.   Allergic/Immunologic: Negative.   Neurological: Negative.   Hematological: Negative.   Psychiatric/Behavioral: Negative.   All other systems reviewed and are negative.      Objective:   Physical Exam Vitals and nursing note reviewed.  Constitutional:      Appearance: Normal appearance. He is obese.  Cardiovascular:     Rate and Rhythm: Normal rate and regular rhythm.     Pulses: Normal pulses.     Heart sounds: Normal heart sounds.  Pulmonary:     Effort: Pulmonary effort is normal.     Breath sounds: Normal breath sounds.  Musculoskeletal:      Cervical back: Normal range of motion and neck supple.     Comments: Normal Muscle Bulk and Muscle Testing Reveals:  Upper Extremities: Full ROM and Muscle Strength 5/5 Lumbar Hypersensitivity Lower Extremities: Right: Decreased ROM and Muscle Strength 5/5 Right Lower Extremity Flexion Produces Pain into his Lumbar Lower Extremity: Full ROM and Muscle Strength 5/5 Arises from Table Slowly  Using cane for support Antalgic  Gait   Skin:    General: Skin is warm and dry.  Neurological:     Mental Status: He is alert and oriented to person, place, and time.  Psychiatric:        Mood and Affect: Mood normal.        Behavior: Behavior normal.           Assessment & Plan:  1. 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 He has lumbar degenerative disc disease. Continuecurrent medication regimen withGabapentin, Continue current Medication regimen. S/P Left Lumbar Facet Radiofrequency Ablation with sedation, on06/01/2021by Dr. Dossie Arbour with good results notedand S/PRightRFA withDr. Judi Cong June24,2021.06/18/2020. Refilled: MS Contin30 mg one tablet every 12 hours #60 and Oxycodone 10mg  one tablet every 6 hours as needed for pain #120. We will continue the opioid monitoring program, this consists of regular clinic visits, examinations, urine drug screen, pill counts as well as use of [place] Controlled Substance Reporting system. A 12 month History has been reviewed on the [place] Controlled Substance Reporting Systemon01/25/2022. 2. Sacroiliac Dysfunction: Continue with current medication, heat and exercise regime.06/18/2020 3. Muscle Spasm:Continue Tizanidineas needed.Continueto Monitor.06/18/2020 4. Chronic Pain Syndrome: Continue MS ContinandOxycodone as needed for pain. Continue to Monitor.06/18/2020. 5. Left Greater Trochanteric Tenderness:No complaints today.Left Hip Osteoarthritis: S/P left intra-articular hip  injectionwith 8 days of relief Samuel [name] reports. Continue with alternating Ice and Heat Therapy. Continue to monitor.06/18/2020.   F/U in 1 Month

## 2020-06-26 DIAGNOSIS — K7460 Unspecified cirrhosis of liver: Secondary | ICD-10-CM | POA: Diagnosis not present

## 2020-06-26 DIAGNOSIS — K3182 Dieulafoy lesion (hemorrhagic) of stomach and duodenum: Secondary | ICD-10-CM | POA: Diagnosis not present

## 2020-06-26 DIAGNOSIS — G8929 Other chronic pain: Secondary | ICD-10-CM | POA: Diagnosis not present

## 2020-06-26 DIAGNOSIS — M549 Dorsalgia, unspecified: Secondary | ICD-10-CM | POA: Diagnosis not present

## 2020-06-26 DIAGNOSIS — E119 Type 2 diabetes mellitus without complications: Secondary | ICD-10-CM | POA: Diagnosis not present

## 2020-06-26 DIAGNOSIS — Z1159 Encounter for screening for other viral diseases: Secondary | ICD-10-CM | POA: Diagnosis not present

## 2020-06-26 DIAGNOSIS — K766 Portal hypertension: Secondary | ICD-10-CM | POA: Diagnosis not present

## 2020-06-26 DIAGNOSIS — E669 Obesity, unspecified: Secondary | ICD-10-CM | POA: Diagnosis not present

## 2020-06-26 DIAGNOSIS — R188 Other ascites: Secondary | ICD-10-CM | POA: Diagnosis not present

## 2020-06-26 DIAGNOSIS — R400 Somnolence: Secondary | ICD-10-CM | POA: Diagnosis not present

## 2020-06-26 DIAGNOSIS — Z7984 Long term (current) use of oral hypoglycemic drugs: Secondary | ICD-10-CM | POA: Diagnosis not present

## 2020-06-26 DIAGNOSIS — I8500 Esophageal varices without bleeding: Secondary | ICD-10-CM | POA: Diagnosis not present

## 2020-06-26 DIAGNOSIS — Z6834 Body mass index (BMI) 34.0-34.9, adult: Secondary | ICD-10-CM | POA: Diagnosis not present

## 2020-06-26 LAB — TOXASSURE SELECT,+ANTIDEPR,UR

## 2020-07-01 ENCOUNTER — Telehealth: Payer: Self-pay | Admitting: *Deleted

## 2020-07-01 NOTE — Telephone Encounter (Signed)
Urine drug screen for this encounter is consistent for prescribed medication 

## 2020-07-04 DIAGNOSIS — E119 Type 2 diabetes mellitus without complications: Secondary | ICD-10-CM | POA: Diagnosis not present

## 2020-07-04 DIAGNOSIS — D500 Iron deficiency anemia secondary to blood loss (chronic): Secondary | ICD-10-CM | POA: Diagnosis not present

## 2020-07-04 DIAGNOSIS — K7581 Nonalcoholic steatohepatitis (NASH): Secondary | ICD-10-CM | POA: Diagnosis not present

## 2020-07-12 DIAGNOSIS — D649 Anemia, unspecified: Secondary | ICD-10-CM | POA: Diagnosis not present

## 2020-07-16 ENCOUNTER — Other Ambulatory Visit: Payer: Self-pay

## 2020-07-16 ENCOUNTER — Telehealth: Payer: Self-pay | Admitting: Registered Nurse

## 2020-07-16 ENCOUNTER — Encounter: Payer: PPO | Attending: Physical Medicine & Rehabilitation | Admitting: Registered Nurse

## 2020-07-16 ENCOUNTER — Encounter: Payer: Self-pay | Admitting: Registered Nurse

## 2020-07-16 VITALS — BP 121/71 | HR 52 | Temp 97.8°F | Ht 73.0 in | Wt 270.4 lb

## 2020-07-16 DIAGNOSIS — G894 Chronic pain syndrome: Secondary | ICD-10-CM | POA: Diagnosis not present

## 2020-07-16 DIAGNOSIS — M5136 Other intervertebral disc degeneration, lumbar region: Secondary | ICD-10-CM | POA: Insufficient documentation

## 2020-07-16 DIAGNOSIS — M5416 Radiculopathy, lumbar region: Secondary | ICD-10-CM | POA: Diagnosis not present

## 2020-07-16 DIAGNOSIS — M62830 Muscle spasm of back: Secondary | ICD-10-CM | POA: Diagnosis not present

## 2020-07-16 DIAGNOSIS — R001 Bradycardia, unspecified: Secondary | ICD-10-CM | POA: Diagnosis not present

## 2020-07-16 DIAGNOSIS — Z5181 Encounter for therapeutic drug level monitoring: Secondary | ICD-10-CM | POA: Insufficient documentation

## 2020-07-16 DIAGNOSIS — M961 Postlaminectomy syndrome, not elsewhere classified: Secondary | ICD-10-CM | POA: Diagnosis not present

## 2020-07-16 DIAGNOSIS — Z79899 Other long term (current) drug therapy: Secondary | ICD-10-CM | POA: Diagnosis not present

## 2020-07-16 MED ORDER — MORPHINE SULFATE ER 30 MG PO TBCR: 30.0000 mg | EXTENDED_RELEASE_TABLET | Freq: Two times a day (BID) | ORAL | 0 refills | Status: DC

## 2020-07-16 MED ORDER — OXYCODONE HCL 10 MG PO TABS: 10.0000 mg | ORAL_TABLET | Freq: Four times a day (QID) | ORAL | 0 refills | Status: DC | PRN

## 2020-07-16 NOTE — Progress Notes (Addendum)
Subjective:    Patient ID: Samuel [name], male    DOB: 1975/07/12, 45 y.o.   MRN: [id number]  HPI: Samuel [name] is a 45 y.o. male who returns for follow up appointment for chronic pain and medication refill. He states his  pain is located in his lower back radiating into his bilateral hips and bilateral lower extremities. He rates his pain 6. His current exercise regime is walking and performing stretching exercises.  Samuel [name] is 120.00 MME.  Last UDS was Performed on 06/18/2020, it was consistent.    Pain Inventory Average Pain 6 Pain Right Now 6 My pain is constant, sharp, dull, stabbing and aching  In the last 24 hours, has pain interfered with the following? General activity 9 Relation with others 9 Enjoyment of life 9 What TIME of day is your pain at its worst? morning , evening and night Sleep (in general) Fair  Pain is worse with: walking, bending, sitting, inactivity and standing Pain improves with: rest and medication Relief from Meds: 5  Family History  Problem Relation Age of Onset  . Hypertension Father    Social History   Socioeconomic History  . Marital status: Married    Spouse name: Not on file  . Number of children: Not on file  . Years of education: Not on file  . Highest education level: Not on file  Occupational History  . Not on file  Tobacco Use  . Smoking status: Never Smoker  . Smokeless tobacco: Never Used  Vaping Use  . Vaping Use: Never used  Substance and Sexual Activity  . Alcohol use: No    Alcohol/week: 0.0 standard drinks  . Drug use: No  . Sexual activity: Not on file  Other Topics Concern  . Not on file  Social History Narrative  . Not on file   Social Determinants of Health   Financial Resource Strain: Not on file  Food Insecurity: Not on file  Transportation Needs: Not on file  Physical Activity: Not on file  Stress: Not on file  Social Connections: Not on file   Past Surgical History:   Procedure Laterality Date  . BACK SURGERY     lower back surgery x 2   Past Surgical History:  Procedure Laterality Date  . BACK SURGERY     lower back surgery x 2   Past Medical History:  Diagnosis Date  . Arthritis    "some in my back"  . GERD (gastroesophageal reflux disease)   . Headache(784.0)    BP 121/71   Pulse (!) 52   Temp 97.8 F (36.6 C)   Ht 6\' 1"  (1.854 m)   Wt 270 lb 6.4 oz (122.7 kg)   SpO2 99%   BMI 35.67 kg/m   Opioid Risk Score:   Fall Risk Score:  `1  Depression screen PHQ 2/9  Depression screen [facility] 2/9 05/14/2020 04/16/2020 02/13/2020 01/16/2020 12/22/2019 11/16/2019 06/13/2019  Decreased Interest 0 2 1 2 1  0 1  Down, Depressed, Hopeless 0 2 1 2 1  0 1  PHQ - 2 Score 0 4 2 4 2  0 2  Altered sleeping - - - - - - -  Tired, decreased energy - - - - - - -  Change in appetite - - - - - - -  Feeling bad or failure about yourself  - - - - - - -  Trouble concentrating - - - - - - -  Moving slowly or  fidgety/restless - - - - - - -  Suicidal thoughts - - - - - - -  PHQ-9 Score - - - - - - -  Difficult doing work/chores - - - - - - -  Some recent data might be hidden   Review of Systems  Musculoskeletal: Positive for back pain and gait problem.       Pain in back both legs and hips  All other systems reviewed and are negative.      Objective:   Physical Exam Vitals and nursing note reviewed.  Constitutional:      Appearance: Normal appearance. He is obese.  Cardiovascular:     Rate and Rhythm: Normal rate and regular rhythm.     Pulses: Normal pulses.     Heart sounds: Normal heart sounds.  Pulmonary:     Breath sounds: Normal breath sounds.  Musculoskeletal:     Cervical back: Normal range of motion and neck supple.     Comments: Normal Muscle Bulk and Muscle Testing Reveals:  Upper Extremities:Full ROM and Muscle Strength 5/5  Lumbar Hypersensitivity Lower Extremities: Decreased ROM and Muscle Strength 5/5 Bilateral Lower Extremities Flexion  Produces Pain into his Lumbar Arises from Table slowly using cane for support Antalgic  Gait   Skin:    General: Skin is warm and dry.  Neurological:     Mental Status: He is alert and oriented to person, place, and time.  Psychiatric:        Mood and Affect: Mood normal.        Behavior: Behavior normal.           Assessment & Plan:  1. 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 He has lumbar degenerative disc disease. Continuecurrent medication regimen withGabapentin, Continue current Medication regimen. S/P Left Lumbar Facet Radiofrequency Ablation with sedation, on06/01/2021by Dr. Dossie Arbour with good results notedand S/PRightRFA withDr. Judi Cong June24,2021.07/16/2020. Refilled: MS Contin30 mg one tablet every 12 hours #60 and Oxycodone 10mg  one tablet every 6 hours as needed for pain #120. We will continue the opioid monitoring program, this consists of regular clinic visits, examinations, urine drug screen, pill counts as well as use of [place] Controlled Substance Reporting system. A 12 month History has been reviewed on the [place] Controlled Substance Reporting Systemon02/22/2022. 2. Sacroiliac Dysfunction: Continue with current medication, heat and exercise regime.07/16/2020 3. Muscle Spasm:Continue Tizanidineas needed.Continueto Monitor.07/16/2020 4. Chronic Pain Syndrome: Continue MS ContinandOxycodone as needed for pain. Continue to Monitor.07/16/2020. 5. Left Greater Trochanteric Tenderness:No complaints today.Left Hip Osteoarthritis: S/P left intra-articular hip injectionwith 8 days of relief Samuel [name] reports. Continue with alternating Ice and Heat Therapy. Continue to monitor.07/16/2020. 6. Bradycardia: He was prescribed Coreg: on 06/26/2020; He will follow up with his PCP and Gastroenterologist. He verbalizes understanding.   F/U in 1 Month

## 2020-07-16 NOTE — Telephone Encounter (Signed)
This provider spoke with Samuel [name], he was encouraged to purchase a blood pressure cuff apparatus and to F/U with his PCP and Gastroenterologist. He states he was prescribed Coreg on 06/26/2020. He verbalizes understanding.

## 2020-07-25 DIAGNOSIS — K920 Hematemesis: Secondary | ICD-10-CM | POA: Diagnosis not present

## 2020-07-25 DIAGNOSIS — D131 Benign neoplasm of stomach: Secondary | ICD-10-CM | POA: Diagnosis not present

## 2020-07-25 DIAGNOSIS — K7460 Unspecified cirrhosis of liver: Secondary | ICD-10-CM | POA: Diagnosis not present

## 2020-07-25 DIAGNOSIS — D649 Anemia, unspecified: Secondary | ICD-10-CM | POA: Diagnosis not present

## 2020-08-13 ENCOUNTER — Encounter: Payer: PPO | Attending: Physical Medicine & Rehabilitation | Admitting: Registered Nurse

## 2020-08-13 ENCOUNTER — Encounter: Payer: Self-pay | Admitting: Registered Nurse

## 2020-08-13 ENCOUNTER — Other Ambulatory Visit: Payer: Self-pay

## 2020-08-13 VITALS — BP 120/71 | HR 52 | Temp 97.9°F | Ht 73.0 in | Wt 255.0 lb

## 2020-08-13 DIAGNOSIS — M62830 Muscle spasm of back: Secondary | ICD-10-CM

## 2020-08-13 DIAGNOSIS — Z5181 Encounter for therapeutic drug level monitoring: Secondary | ICD-10-CM | POA: Diagnosis not present

## 2020-08-13 DIAGNOSIS — Z79899 Other long term (current) drug therapy: Secondary | ICD-10-CM

## 2020-08-13 DIAGNOSIS — R634 Abnormal weight loss: Secondary | ICD-10-CM

## 2020-08-13 DIAGNOSIS — M961 Postlaminectomy syndrome, not elsewhere classified: Secondary | ICD-10-CM

## 2020-08-13 DIAGNOSIS — R001 Bradycardia, unspecified: Secondary | ICD-10-CM | POA: Diagnosis not present

## 2020-08-13 DIAGNOSIS — M5136 Other intervertebral disc degeneration, lumbar region: Secondary | ICD-10-CM

## 2020-08-13 DIAGNOSIS — M5416 Radiculopathy, lumbar region: Secondary | ICD-10-CM

## 2020-08-13 DIAGNOSIS — G894 Chronic pain syndrome: Secondary | ICD-10-CM | POA: Diagnosis not present

## 2020-08-13 DIAGNOSIS — M51369 Other intervertebral disc degeneration, lumbar region without mention of lumbar back pain or lower extremity pain: Secondary | ICD-10-CM

## 2020-08-13 MED ORDER — MORPHINE SULFATE ER 30 MG PO TBCR: 30.0000 mg | EXTENDED_RELEASE_TABLET | Freq: Two times a day (BID) | ORAL | 0 refills | Status: DC

## 2020-08-13 MED ORDER — OXYCODONE HCL 10 MG PO TABS: 10.0000 mg | ORAL_TABLET | Freq: Four times a day (QID) | ORAL | 0 refills | Status: DC | PRN

## 2020-08-13 NOTE — Progress Notes (Addendum)
Subjective:    Patient ID: Samuel [name], male    DOB: [birth year], 45 y.o.   MRN: [id number]  HPI: Samuel [name] is a 45 y.o. male who returns for follow up appointment for chronic pain and medication refill. He states his pain is located in his lower back radiating into his bilateral lower extremities. He  rates his pain 6. His  current exercise regime is walking and performing stretching exercises.  Samuel [name] arrived bradycardic apical pulse checked. He is prescribed Coreg by his gastroenterologist. He reports his dose was decreased and he was instructed to call his gastroenterologist, he verbalizes understanding. Also encouraged to purchase a pulse Ox or blood pressure apparatus so he can check his pulse daily, he verbalizes understanding.   Samuel [name] Morphine equivalent is  120.00 MME.    Last UDS was Performed 06/18/2020, it was consistent.    Pain Inventory Average Pain 6 Pain Right Now 6 My pain is constant, burning, dull, stabbing and aching  In the last 24 hours, has pain interfered with the following? General activity 9 Relation with others 9 Enjoyment of life 9 What TIME of day is your pain at its worst? morning , evening and night Sleep (in general) Fair  Pain is worse with: walking, bending, sitting, inactivity and standing Pain improves with: rest and medication Relief from Meds: 4  Family History  Problem Relation Age of Onset  . Hypertension Father    Social History   Socioeconomic History  . Marital status: Married    Spouse name: Not on file  . Number of children: Not on file  . Years of education: Not on file  . Highest education level: Not on file  Occupational History  . Not on file  Tobacco Use  . Smoking status: Never Smoker  . Smokeless tobacco: Never Used  Vaping Use  . Vaping Use: Never used  Substance and Sexual Activity  . Alcohol use: No    Alcohol/week: 0.0 standard drinks  . Drug use: No  . Sexual activity: Not on file   Other Topics Concern  . Not on file  Social History Narrative  . Not on file   Social Determinants of Health   Financial Resource Strain: Not on file  Food Insecurity: Not on file  Transportation Needs: Not on file  Physical Activity: Not on file  Stress: Not on file  Social Connections: Not on file   Past Surgical History:  Procedure Laterality Date  . BACK SURGERY     lower back surgery x 2   Past Surgical History:  Procedure Laterality Date  . BACK SURGERY     lower back surgery x 2   Past Medical History:  Diagnosis Date  . Arthritis    "some in my back"  . GERD (gastroesophageal reflux disease)   . Headache(784.0)    BP 120/71   Pulse (!) 52   Temp 97.9 F (36.6 C)   Ht 6\' 1"  (1.854 m)   Wt 255 lb (115.7 kg)   SpO2 99%   BMI 33.64 kg/m   Opioid Risk Score:   Fall Risk Score:  `1  Depression screen PHQ 2/9  Depression screen [facility] 2/9 08/13/2020 07/16/2020 05/14/2020 04/16/2020 02/13/2020 01/16/2020 12/22/2019  Decreased Interest 1 0 0 2 1 2 1   Down, Depressed, Hopeless 1 0 0 2 1 2 1   PHQ - 2 Score 2 0 0 4 2 4 2   Altered sleeping - - - - - - -  Tired, decreased energy - - - - - - -  Change in appetite - - - - - - -  Feeling bad or failure about yourself  - - - - - - -  Trouble concentrating - - - - - - -  Moving slowly or fidgety/restless - - - - - - -  Suicidal thoughts - - - - - - -  PHQ-9 Score - - - - - - -  Difficult doing work/chores - - - - - - -  Some recent data might be hidden      Review of Systems  Constitutional: Negative.   HENT: Negative.   Eyes: Negative.   Respiratory: Negative.   Cardiovascular: Negative.   Gastrointestinal: Negative.   Endocrine: Negative.   Genitourinary: Negative.   Musculoskeletal: Positive for back pain.  Skin: Negative.   Allergic/Immunologic: Negative.   Neurological: Negative.   Hematological: Negative.   Psychiatric/Behavioral: Negative.        Objective:   Physical Exam Vitals and nursing  note reviewed.  Constitutional:      Appearance: Normal appearance.  Cardiovascular:     Rate and Rhythm: Normal rate and regular rhythm.     Pulses: Normal pulses.     Heart sounds: Normal heart sounds.  Pulmonary:     Effort: Pulmonary effort is normal.     Breath sounds: Normal breath sounds.  Musculoskeletal:     Cervical back: Normal range of motion and neck supple.     Comments: Normal Muscle Bulk and Muscle Testing Reveals:  Upper Extremities: Full ROM and Muscle Strength 5/5 Lumbar Hypersensitivity Lower Extremities: Full ROM and Muscle Strength 5/5 Arises from Table Slowly using cane for support Narrow Based  Gait   Skin:    General: Skin is warm and dry.  Neurological:     Mental Status: He is alert and oriented to person, place, and time.  Psychiatric:        Mood and Affect: Mood normal.        Behavior: Behavior normal.           Assessment & Plan:  1. 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 He has lumbar degenerative disc disease. Continuecurrent medication regimen withGabapentin, Continue current Medication regimen. S/P Left Lumbar Facet Radiofrequency Ablation with sedation, on06/01/2021by Dr. Dossie Arbour with good results notedand S/PRightRFA withDr. Judi Cong June24,2021.08/13/2020. Refilled: MS Contin30 mg one tablet every 12 hours #60 and Oxycodone 10mg  one tablet every 6 hours as needed for pain #120. We will continue the opioid monitoring program, this consists of regular clinic visits, examinations, urine drug screen, pill counts as well as use of [place] Controlled Substance Reporting system. A 12 month History has been reviewed on the [place] Controlled Substance Reporting Systemon03/22/2022. 2. Sacroiliac Dysfunction: Continue with current medication, heat and exercise regime.08/13/2020 3. Muscle Spasm:Continue Tizanidineas needed.Continueto Monitor.08/13/2020 4. Chronic Pain  Syndrome: Continue MS ContinandOxycodone as needed for pain. Continue to Monitor.08/13/2020. 5. Left Greater Trochanteric Tenderness:No complaints today.Left Hip Osteoarthritis: S/P left intra-articular hip injectionwith 8 days of relief Samuel [name] reports. Continue with alternating Ice and Heat Therapy. Continue to monitor.08/13/2020. 6. Bradycardia: He has been  prescribed Coreg: and was instructed to follow up with his PCP and Gastroenterologist. He verbalizes understanding.  7. Loss of weight: Gastroenterologist Following. Mr. Sprung states he was instructed to lose weight by his gastroenterologist, he is following a health diet regime and exercise. We will continue to monitor.  F/U in 1 Month

## 2020-09-10 ENCOUNTER — Other Ambulatory Visit: Payer: Self-pay

## 2020-09-10 ENCOUNTER — Encounter: Payer: Self-pay | Admitting: Registered Nurse

## 2020-09-10 ENCOUNTER — Encounter: Payer: PPO | Attending: Physical Medicine & Rehabilitation | Admitting: Registered Nurse

## 2020-09-10 VITALS — BP 139/74 | HR 52 | Temp 97.9°F | Ht 73.0 in | Wt 246.2 lb

## 2020-09-10 DIAGNOSIS — M5136 Other intervertebral disc degeneration, lumbar region: Secondary | ICD-10-CM | POA: Diagnosis not present

## 2020-09-10 DIAGNOSIS — R001 Bradycardia, unspecified: Secondary | ICD-10-CM | POA: Diagnosis not present

## 2020-09-10 DIAGNOSIS — M62830 Muscle spasm of back: Secondary | ICD-10-CM | POA: Diagnosis not present

## 2020-09-10 DIAGNOSIS — G894 Chronic pain syndrome: Secondary | ICD-10-CM | POA: Insufficient documentation

## 2020-09-10 DIAGNOSIS — M961 Postlaminectomy syndrome, not elsewhere classified: Secondary | ICD-10-CM | POA: Diagnosis not present

## 2020-09-10 DIAGNOSIS — Z79899 Other long term (current) drug therapy: Secondary | ICD-10-CM | POA: Insufficient documentation

## 2020-09-10 DIAGNOSIS — Z5181 Encounter for therapeutic drug level monitoring: Secondary | ICD-10-CM | POA: Insufficient documentation

## 2020-09-10 MED ORDER — MORPHINE SULFATE ER 30 MG PO TBCR: 30.0000 mg | EXTENDED_RELEASE_TABLET | Freq: Two times a day (BID) | ORAL | 0 refills | Status: DC

## 2020-09-10 MED ORDER — OXYCODONE HCL 10 MG PO TABS: 10.0000 mg | ORAL_TABLET | Freq: Four times a day (QID) | ORAL | 0 refills | Status: DC | PRN

## 2020-09-10 NOTE — Progress Notes (Signed)
Subjective:    Patient ID: Samuel [name], male    DOB: Dec 10, 1975, 45 y.o.   MRN: [id number]  HPI: Samuel [name] is a 45 y.o. male who returns for follow up appointment for chronic pain and medication refill. He states his pain is located in his lower back. He rates his pain 6. His current exercise regime is walking and performing stretching exercises.  [name] equivalent is 120.00 MME.    Last UDS was Performed on 06/18/2020, it was consistent.    Pain Inventory Average Pain 6 Pain Right Now 6 My pain is constant, sharp, burning, dull, stabbing and aching  In the last 24 hours, has pain interfered with the following? General activity 9 Relation with others 9 Enjoyment of life 9 What TIME of day is your pain at its worst? morning , evening and night Sleep (in general) Fair  Pain is worse with: walking, bending, sitting, inactivity and standing Pain improves with: rest and medication Relief from Meds: 5  Family History  Problem Relation Age of Onset  . Hypertension Father    Social History   Socioeconomic History  . Marital status: Married    Spouse name: Not on file  . Number of children: Not on file  . Years of education: Not on file  . Highest education level: Not on file  Occupational History  . Not on file  Tobacco Use  . Smoking status: Never Smoker  . Smokeless tobacco: Never Used  Vaping Use  . Vaping Use: Never used  Substance and Sexual Activity  . Alcohol use: No    Alcohol/week: 0.0 standard drinks  . Drug use: No  . Sexual activity: Not on file  Other Topics Concern  . Not on file  Social History Narrative  . Not on file   Social Determinants of Health   Financial Resource Strain: Not on file  Food Insecurity: Not on file  Transportation Needs: Not on file  Physical Activity: Not on file  Stress: Not on file  Social Connections: Not on file   Past Surgical History:  Procedure Laterality Date  . BACK SURGERY      lower back surgery x 2   Past Surgical History:  Procedure Laterality Date  . BACK SURGERY     lower back surgery x 2   Past Medical History:  Diagnosis Date  . Arthritis    "some in my back"  . GERD (gastroesophageal reflux disease)   . Headache(784.0)    There were no vitals taken for this visit.  Opioid Risk Score:   Fall Risk Score:  `1  Depression screen PHQ 2/9  Depression screen [facility] 2/9 08/13/2020 07/16/2020 05/14/2020 04/16/2020 02/13/2020 01/16/2020 12/22/2019  Decreased Interest 1 0 0 2 1 2 1   Down, Depressed, Hopeless 1 0 0 2 1 2 1   PHQ - 2 Score 2 0 0 4 2 4 2   Altered sleeping - - - - - - -  Tired, decreased energy - - - - - - -  Change in appetite - - - - - - -  Feeling bad or failure about yourself  - - - - - - -  Trouble concentrating - - - - - - -  Moving slowly or fidgety/restless - - - - - - -  Suicidal thoughts - - - - - - -  PHQ-9 Score - - - - - - -  Difficult doing work/chores - - - - - - -  Some recent data might be hidden   Review of Systems  Musculoskeletal: Positive for back pain and gait problem.  All other systems reviewed and are negative.      Objective:   Physical Exam Vitals and nursing note reviewed.  Constitutional:      Appearance: Normal appearance.  Cardiovascular:     Rate and Rhythm: Normal rate and regular rhythm.     Pulses: Normal pulses.     Heart sounds: Normal heart sounds.  Pulmonary:     Effort: Pulmonary effort is normal.     Breath sounds: Normal breath sounds.  Musculoskeletal:     Cervical back: Normal range of motion and neck supple.     Comments: Normal Muscle Bulk and Muscle Testing Reveals:  Upper Extremities: Full ROM and Muscle Strength 5/5 , Lumbar Hypersensitivity Lower Extremities: Full ROM and Muscle Strength 5/5 Arises from Table slowly using cane for support Antalgic Gait   Skin:    General: Skin is warm and dry.  Neurological:     Mental Status: He is alert and oriented to person, place, and  time.  Psychiatric:        Mood and Affect: Mood normal.        Behavior: Behavior normal.           Assessment & Plan:  1. 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 He has lumbar degenerative disc disease. Continuecurrent medication regimen withGabapentin, Continue current Medication regimen. S/P Left Lumbar Facet Radiofrequency Ablation with sedation, on06/01/2021by Dr. Dossie Arbour with good results notedand S/PRightRFA withDr. Judi Cong June24,2021.09/10/2020. Refilled: MS Contin30 mg one tablet every 12 hours #60 and Oxycodone 10mg  one tablet every 6 hours as needed for pain #120. We will continue the opioid monitoring program, this consists of regular clinic visits, examinations, urine drug screen, pill counts as well as use of [place] Controlled Substance Reporting system. A 12 month History has been reviewed on the [place] Controlled Substance Reporting Systemon04/19/2022. 2. Sacroiliac Dysfunction: Continue with current medication, heat and exercise regime.09/10/2020 3. Muscle Spasm:Continue Tizanidineas needed.Continueto Monitor.09/10/2020 4. Chronic Pain Syndrome: Continue MS ContinandOxycodone as needed for pain. Continue to Monitor.0419/2022. 5. Left Greater Trochanteric Tenderness:No complaints today.Left Hip Osteoarthritis: S/P left intra-articular hip injectionwith 8 days of relief Samuel [name] reports. Continue with alternating Ice and Heat Therapy. Continue to monitor.09/10/2020. 6. Bradycardia: He's prescribed Coreg and his  PCP and Gastroenterologist Following he states. We will continue to monitor.09/10/2020 7. Loss of weight: Gastroenterologist Following. Samuel [name] states he was instructed to lose weight by his gastroenterologist, he is following a health diet regime and exercise. We will continue to monitor. 09/10/2020  F/U in 1 Month

## 2020-09-25 DIAGNOSIS — K7460 Unspecified cirrhosis of liver: Secondary | ICD-10-CM | POA: Diagnosis not present

## 2020-10-01 DIAGNOSIS — E669 Obesity, unspecified: Secondary | ICD-10-CM | POA: Diagnosis not present

## 2020-10-01 DIAGNOSIS — D72819 Decreased white blood cell count, unspecified: Secondary | ICD-10-CM | POA: Diagnosis not present

## 2020-10-01 DIAGNOSIS — E119 Type 2 diabetes mellitus without complications: Secondary | ICD-10-CM | POA: Diagnosis not present

## 2020-10-01 DIAGNOSIS — D500 Iron deficiency anemia secondary to blood loss (chronic): Secondary | ICD-10-CM | POA: Diagnosis not present

## 2020-10-11 ENCOUNTER — Other Ambulatory Visit: Payer: Self-pay

## 2020-10-11 ENCOUNTER — Encounter: Payer: Self-pay | Admitting: Registered Nurse

## 2020-10-11 ENCOUNTER — Encounter: Payer: PPO | Attending: Physical Medicine & Rehabilitation | Admitting: Registered Nurse

## 2020-10-11 VITALS — BP 119/61 | HR 54 | Temp 98.6°F | Ht 73.0 in | Wt 244.0 lb

## 2020-10-11 DIAGNOSIS — G894 Chronic pain syndrome: Secondary | ICD-10-CM | POA: Diagnosis not present

## 2020-10-11 DIAGNOSIS — Z5181 Encounter for therapeutic drug level monitoring: Secondary | ICD-10-CM | POA: Diagnosis not present

## 2020-10-11 DIAGNOSIS — M62830 Muscle spasm of back: Secondary | ICD-10-CM | POA: Insufficient documentation

## 2020-10-11 DIAGNOSIS — M5136 Other intervertebral disc degeneration, lumbar region: Secondary | ICD-10-CM | POA: Diagnosis not present

## 2020-10-11 DIAGNOSIS — M961 Postlaminectomy syndrome, not elsewhere classified: Secondary | ICD-10-CM | POA: Insufficient documentation

## 2020-10-11 DIAGNOSIS — M5416 Radiculopathy, lumbar region: Secondary | ICD-10-CM | POA: Insufficient documentation

## 2020-10-11 DIAGNOSIS — Z79899 Other long term (current) drug therapy: Secondary | ICD-10-CM | POA: Diagnosis not present

## 2020-10-11 DIAGNOSIS — R001 Bradycardia, unspecified: Secondary | ICD-10-CM | POA: Diagnosis not present

## 2020-10-11 MED ORDER — MORPHINE SULFATE ER 30 MG PO TBCR: 30.0000 mg | EXTENDED_RELEASE_TABLET | Freq: Two times a day (BID) | ORAL | 0 refills | Status: DC

## 2020-10-11 MED ORDER — OXYCODONE HCL 10 MG PO TABS: 10.0000 mg | ORAL_TABLET | Freq: Four times a day (QID) | ORAL | 0 refills | Status: DC | PRN

## 2020-10-11 MED ORDER — GABAPENTIN 300 MG PO CAPS: ORAL_CAPSULE | ORAL | 2 refills | Status: DC

## 2020-10-11 NOTE — Progress Notes (Signed)
Subjective:    Patient ID: Samuel [name], male    DOB: 06/02/1975, 45 y.o.   MRN: [id number]  HPI: Samuel [name] is a 45 y.o. male who returns for follow up appointment for chronic pain and medication refill. He states his  pain is located in his lower back radiating into his bilateral lower extremities. He  rates his pain 5. His current exercise regime is walking and performing stretching exercises.  [name] Morphine equivalent is 120.00 MME.  UDS was ordered today.     Pain Inventory Average Pain 5 Pain Right Now 5 My pain is constant, sharp, dull, stabbing and aching  In the last 24 hours, has pain interfered with the following? General activity 9 Relation with others 9 Enjoyment of life 9 What TIME of day is your pain at its worst? morning , evening and night Sleep (in general) Fair  Pain is worse with: walking, bending, sitting, inactivity and standing Pain improves with: rest and medication Relief from Meds: 5  Family History  Problem Relation Age of Onset  . Hypertension Father    Social History   Socioeconomic History  . Marital status: Married    Spouse name: Not on file  . Number of children: Not on file  . Years of education: Not on file  . Highest education level: Not on file  Occupational History  . Not on file  Tobacco Use  . Smoking status: Never Smoker  . Smokeless tobacco: Never Used  Vaping Use  . Vaping Use: Never used  Substance and Sexual Activity  . Alcohol use: No    Alcohol/week: 0.0 standard drinks  . Drug use: No  . Sexual activity: Not on file  Other Topics Concern  . Not on file  Social History Narrative  . Not on file   Social Determinants of Health   Financial Resource Strain: Not on file  Food Insecurity: Not on file  Transportation Needs: Not on file  Physical Activity: Not on file  Stress: Not on file  Social Connections: Not on file   Past Surgical History:  Procedure Laterality Date  . BACK SURGERY      lower back surgery x 2   Past Surgical History:  Procedure Laterality Date  . BACK SURGERY     lower back surgery x 2   Past Medical History:  Diagnosis Date  . Arthritis    "some in my back"  . GERD (gastroesophageal reflux disease)   . Headache(784.0)    BP 119/61   Pulse (!) 54   Temp 98.6 F (37 C)   Ht 6\' 1"  (1.854 m)   Wt 244 lb (110.7 kg)   SpO2 97%   BMI 32.19 kg/m   Opioid Risk Score:   Fall Risk Score:  `1  Depression screen PHQ 2/9  Depression screen [facility] 2/9 09/10/2020 08/13/2020 07/16/2020 05/14/2020 04/16/2020 02/13/2020 01/16/2020  Decreased Interest 1 1 0 0 2 1 2   Down, Depressed, Hopeless 1 1 0 0 2 1 2   PHQ - 2 Score 2 2 0 0 4 2 4   Altered sleeping - - - - - - -  Tired, decreased energy - - - - - - -  Change in appetite - - - - - - -  Feeling bad or failure about yourself  - - - - - - -  Trouble concentrating - - - - - - -  Moving slowly or fidgety/restless - - - - - - -  Suicidal thoughts - - - - - - -  PHQ-9 Score - - - - - - -  Difficult doing work/chores - - - - - - -  Some recent data might be hidden    Review of Systems  Constitutional: Negative.   HENT: Negative.   Eyes: Negative.   Respiratory: Negative.   Cardiovascular: Negative.   Gastrointestinal: Negative.   Endocrine: Negative.   Genitourinary: Negative.   Musculoskeletal: Positive for back pain and gait problem.  Skin: Negative.   Allergic/Immunologic: Negative.   Hematological: Negative.   Psychiatric/Behavioral: Negative.   All other systems reviewed and are negative.      Objective:   Physical Exam Vitals and nursing note reviewed.  Constitutional:      Appearance: Normal appearance.  Cardiovascular:     Rate and Rhythm: Normal rate and regular rhythm.     Pulses: Normal pulses.     Heart sounds: Normal heart sounds.  Pulmonary:     Effort: Pulmonary effort is normal.     Breath sounds: Normal breath sounds.  Musculoskeletal:     Cervical back: Normal range of  motion.     Comments: Normal Muscle Bulk and Muscle Testing Reveals:  Upper Extremities: Full ROM and Muscle Strength 5/5  Lumbar Hypersensitivity Lower Extremities: Decreased ROM and Muscle Strength 5/5 Bilateral Lower Extremities Flexion Produces Pain into his Lower Back Arises from Table slowly using cane for support Antalgic  Gait   Skin:    General: Skin is warm and dry.  Neurological:     Mental Status: He is alert and oriented to person, place, and time.  Psychiatric:        Mood and Affect: Mood normal.        Behavior: Behavior normal.           Assessment & Plan:  1. 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 He has lumbar degenerative disc disease. Continuecurrent medication regimen withGabapentin, Continue current Medication regimen. S/P Left Lumbar Facet Radiofrequency Ablation with sedation, on06/01/2021by Dr. Dossie Arbour with good results notedand S/PRightRFA withDr. Judi Cong June24,2021.10/11/2020. Refilled: MS Contin30 mg one tablet every 12 hours #60 and Oxycodone 10mg  one tablet every 6 hours as needed for pain #120. We will continue the opioid monitoring program, this consists of regular clinic visits, examinations, urine drug screen, pill counts as well as use of [place] Controlled Substance Reporting system. A 12 month History has been reviewed on the [place] Controlled Substance Reporting Systemon05/20/2022. 2. Sacroiliac Dysfunction: Continue with current medication, heat and exercise regime.10/11/2020 3. Muscle Spasm:Continue Tizanidineas needed.Continueto Monitor.10/11/2020 4. Chronic Pain Syndrome: Continue MS ContinandOxycodone as needed for pain. Continue to Monitor.10/11/2020. 5. Left Greater Trochanteric Tenderness:No complaints today.Left Hip Osteoarthritis: S/P left intra-articular hip injectionwith 8 days of relief [name] reports. Continue with alternating Ice and Heat  Therapy. Continue to monitor.10/11/2020. 6. Bradycardia: He'sprescribed Coreg and his  PCP and Gastroenterologist Following he states. We will continue to monitor05/20/2022 7. Loss of weight: Gastroenterologist Following. [name] states he was instructed to lose weight by his gastroenterologist, he is following a health diet regime and exercise. We will continue to monitor.10/11/2020  F/U in 1 Month

## 2020-10-16 LAB — TOXASSURE SELECT,+ANTIDEPR,UR

## 2020-10-31 ENCOUNTER — Telehealth: Payer: Self-pay | Admitting: *Deleted

## 2020-10-31 NOTE — Telephone Encounter (Signed)
Urine drug screen for this encounter is consistent for prescribed medication 

## 2020-11-01 DIAGNOSIS — H43393 Other vitreous opacities, bilateral: Secondary | ICD-10-CM | POA: Diagnosis not present

## 2020-11-01 DIAGNOSIS — H40033 Anatomical narrow angle, bilateral: Secondary | ICD-10-CM | POA: Diagnosis not present

## 2020-11-11 ENCOUNTER — Encounter: Payer: PPO | Attending: Physical Medicine & Rehabilitation | Admitting: Registered Nurse

## 2020-11-11 ENCOUNTER — Other Ambulatory Visit: Payer: Self-pay

## 2020-11-11 ENCOUNTER — Encounter: Payer: Self-pay | Admitting: Registered Nurse

## 2020-11-11 VITALS — BP 136/71 | HR 52 | Temp 98.2°F | Ht 73.0 in | Wt 241.4 lb

## 2020-11-11 DIAGNOSIS — M961 Postlaminectomy syndrome, not elsewhere classified: Secondary | ICD-10-CM | POA: Diagnosis not present

## 2020-11-11 DIAGNOSIS — Z5181 Encounter for therapeutic drug level monitoring: Secondary | ICD-10-CM | POA: Insufficient documentation

## 2020-11-11 DIAGNOSIS — M5136 Other intervertebral disc degeneration, lumbar region: Secondary | ICD-10-CM | POA: Insufficient documentation

## 2020-11-11 DIAGNOSIS — Z79899 Other long term (current) drug therapy: Secondary | ICD-10-CM | POA: Insufficient documentation

## 2020-11-11 DIAGNOSIS — M62830 Muscle spasm of back: Secondary | ICD-10-CM | POA: Insufficient documentation

## 2020-11-11 DIAGNOSIS — G894 Chronic pain syndrome: Secondary | ICD-10-CM | POA: Insufficient documentation

## 2020-11-11 MED ORDER — MORPHINE SULFATE ER 30 MG PO TBCR: 30.0000 mg | EXTENDED_RELEASE_TABLET | Freq: Two times a day (BID) | ORAL | 0 refills | Status: DC

## 2020-11-11 MED ORDER — OXYCODONE HCL 10 MG PO TABS: 10.0000 mg | ORAL_TABLET | Freq: Four times a day (QID) | ORAL | 0 refills | Status: DC | PRN

## 2020-11-11 NOTE — Progress Notes (Signed)
Subjective:    Patient ID: Samuel [name], male    DOB: Jun 19, 1975, 45 y.o.   MRN: [id number]  HPI: Samuel [name] is a 45 y.o. male who returns for follow up appointment for chronic pain and medication refill. He states his pain is located in his lower back radiating into his bilateral lower extremities. He rates his pain 6. His current exercise regime is walking and performing stretching exercises.    [name] Morphine equivalent is 116.00 MME.   Last UDS was Performed on 10/11/2020, it was consistent.   Pain Inventory Average Pain 6 Pain Right Now 6 My pain is constant, sharp, dull, stabbing, and aching  In the last 24 hours, has pain interfered with the following? General activity 9 Relation with others 9 Enjoyment of life 9 What TIME of day is your pain at its worst? morning , evening, and night Sleep (in general) Fair  Pain is worse with: walking, bending, sitting, inactivity, and standing Pain improves with: rest and medication Relief from Meds: 5  Family History  Problem Relation Age of Onset   Hypertension Father    Social History   Socioeconomic History   Marital status: Married    Spouse name: Not on file   Number of children: Not on file   Years of education: Not on file   Highest education level: Not on file  Occupational History   Not on file  Tobacco Use   Smoking status: Never   Smokeless tobacco: Never  Vaping Use   Vaping Use: Never used  Substance and Sexual Activity   Alcohol use: No    Alcohol/week: 0.0 standard drinks   Drug use: No   Sexual activity: Not on file  Other Topics Concern   Not on file  Social History Narrative   Not on file   Social Determinants of Health   Financial Resource Strain: Not on file  Food Insecurity: Not on file  Transportation Needs: Not on file  Physical Activity: Not on file  Stress: Not on file  Social Connections: Not on file   Past Surgical History:  Procedure Laterality Date   BACK  SURGERY     lower back surgery x 2   Past Surgical History:  Procedure Laterality Date   BACK SURGERY     lower back surgery x 2   Past Medical History:  Diagnosis Date   Arthritis    "some in my back"   GERD (gastroesophageal reflux disease)    Headache(784.0)    BP 136/71 (BP Location: Right Arm, Patient Position: Sitting, Cuff Size: Large)   Pulse (!) 48   Temp 98.2 F (36.8 C) (Oral)   Ht 6\' 1"  (1.854 m)   Wt 241 lb 6.4 oz (109.5 kg)   SpO2 98%   BMI 31.85 kg/m   Opioid Risk Score:   Fall Risk Score:  `1  Depression screen PHQ 2/9  Depression screen [facility] 2/9 09/10/2020 08/13/2020 07/16/2020 05/14/2020 04/16/2020 02/13/2020 01/16/2020  Decreased Interest 1 1 0 0 2 1 2   Down, Depressed, Hopeless 1 1 0 0 2 1 2   PHQ - 2 Score 2 2 0 0 4 2 4   Altered sleeping - - - - - - -  Tired, decreased energy - - - - - - -  Change in appetite - - - - - - -  Feeling bad or failure about yourself  - - - - - - -  Trouble concentrating - - - - - - -  Moving slowly or fidgety/restless - - - - - - -  Suicidal thoughts - - - - - - -  PHQ-9 Score - - - - - - -  Difficult doing work/chores - - - - - - -  Some recent data might be hidden     Review of Systems  Constitutional: Negative.   HENT: Negative.    Eyes: Negative.   Respiratory: Negative.    Cardiovascular: Negative.   Gastrointestinal: Negative.   Endocrine: Negative.   Genitourinary: Negative.   Musculoskeletal:  Positive for back pain.  Skin: Negative.   Allergic/Immunologic: Negative.   Neurological: Negative.   Hematological: Negative.   Psychiatric/Behavioral: Negative.        Objective:   Physical Exam Vitals and nursing note reviewed.  Constitutional:      Appearance: Normal appearance.  Cardiovascular:     Rate and Rhythm: Normal rate and regular rhythm.     Pulses: Normal pulses.     Heart sounds: Normal heart sounds.  Pulmonary:     Effort: Pulmonary effort is normal.     Breath sounds: Normal breath  sounds.  Musculoskeletal:     Cervical back: Normal range of motion and neck supple.     Comments: Normal Muscle Bulk and Muscle Testing Reveals:  Upper Extremities: Full ROM and Muscle Strength  5/5 Lumbar Hypersensitivity Left Greater Trochanter Tenderness Lower Extremities: Decreased ROM and Muscle Strength 5/5 Bilateral Lower Extremity Flexion Produces Pain into his Lumbar Arises from Table Slowly using cane for support Antalgic Gait     Skin:    General: Skin is warm and dry.  Neurological:     Mental Status: He is alert and oriented to person, place, and time.  Psychiatric:        Mood and Affect: Mood normal.        Behavior: Behavior normal.         Assessment & Plan:  1.  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 He has lumbar degenerative disc disease. Continue current medication regimen with  Gabapentin, Continue current Medication regimen . S/P  Left  Lumbar Facet Radiofrequency Ablation with sedation, on 11/12/2019 by Dr. Dossie Arbour with good results noted and S/P Right RFA with Dr. Lowella Dandy on June 24,2021. 11/11/2020.  Refilled: MS Contin 30 mg one tablet every 12 hours  #60 and Oxycodone 10 mg one tablet every 6 hours as needed for pain #120. We will continue the opioid monitoring program, this consists of regular clinic visits, examinations, urine drug screen, pill counts as well as use of [place] Controlled Substance Reporting system. A 12 month History has been reviewed on the [place] on 11/11/2020. 2. Sacroiliac Dysfunction: Continue with current medication, heat and exercise regime. 11/11/2020 3. Muscle Spasm: Continue Tizanidine as needed. Continue to Monitor.11/11/2020 4. Chronic Pain Syndrome: Continue MS Contin and Oxycodone as needed for pain. Continue to Monitor. 11/11/2020. 5. Left Greater Trochanteric Tenderness: No complaints today.  Left Hip Osteoarthritis: S/P  left  intra-articular hip injection  with 8 days of relief Mr. [name] reports. Continue with alternating Ice and Heat Therapy. Continue to monitor. 11/11/2020. 6. Bradycardia: He's prescribed Coreg and his  PCP and Gastroenterologist Following he states. We will continue to monitor.11/11/2020 7. Loss of weight: Gastroenterologist Following. Mr. Shedd states he was instructed to lose weight by his gastroenterologist , he is following a health diet regime and exercise. We will continue to monitor. 11/11/2020   F/U  in 1 Month

## 2020-12-06 IMAGING — DX DG HIP (WITH OR WITHOUT PELVIS) 2-3V*L*
3 series · 3 of 3 positions shown · non-contrast
Comparison: None.

CLINICAL DATA: Onset left hip pain 2.5 weeks.  No known injury.

EXAM:
DG HIP (WITH OR WITHOUT PELVIS) 2-3V LEFT

[pelvis ap]
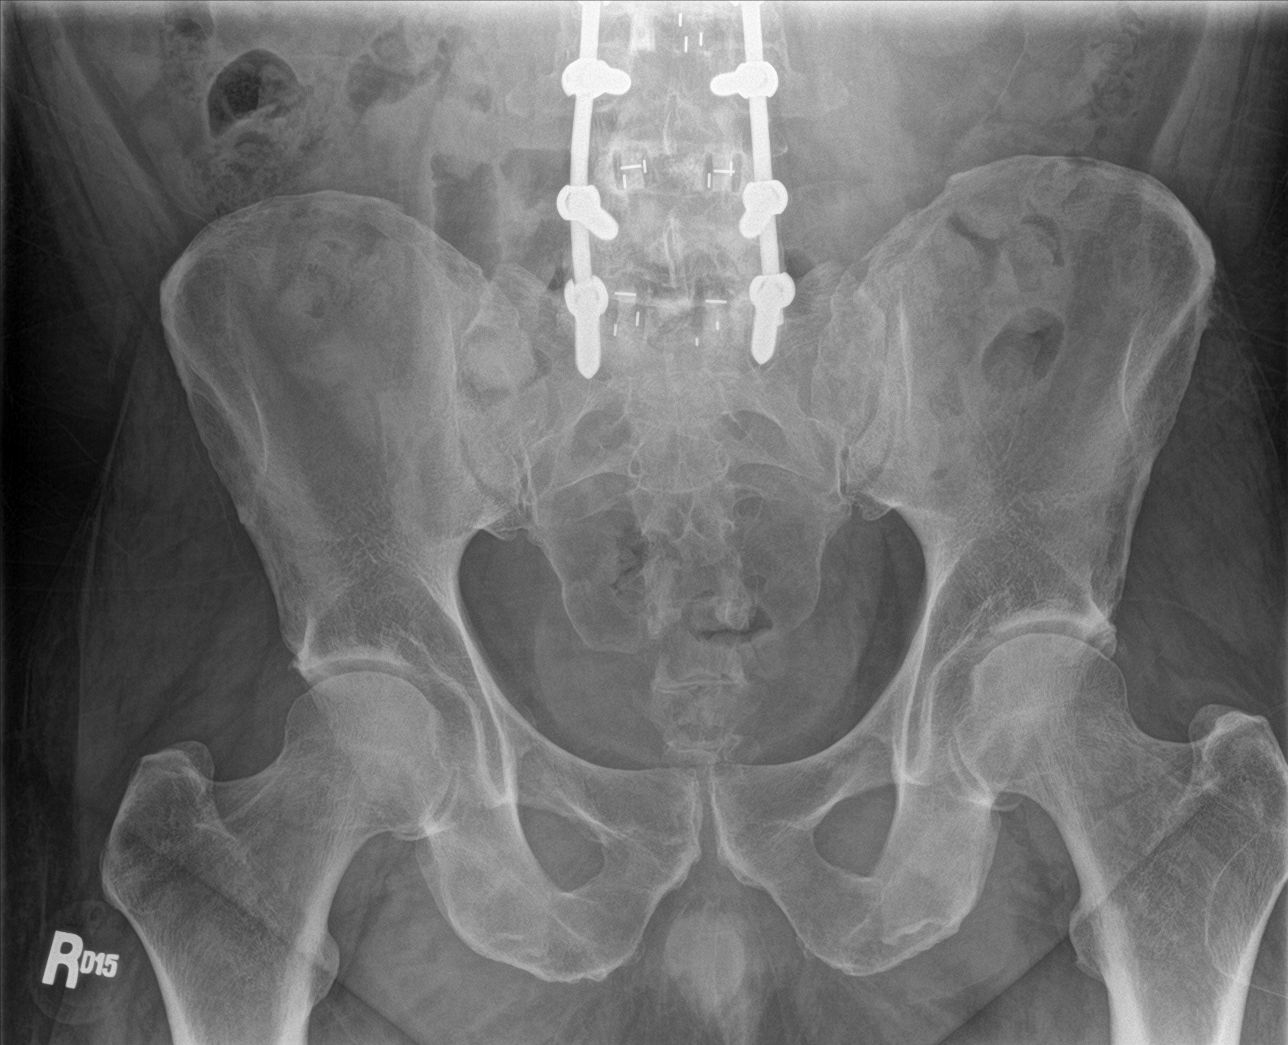

[hip ap]
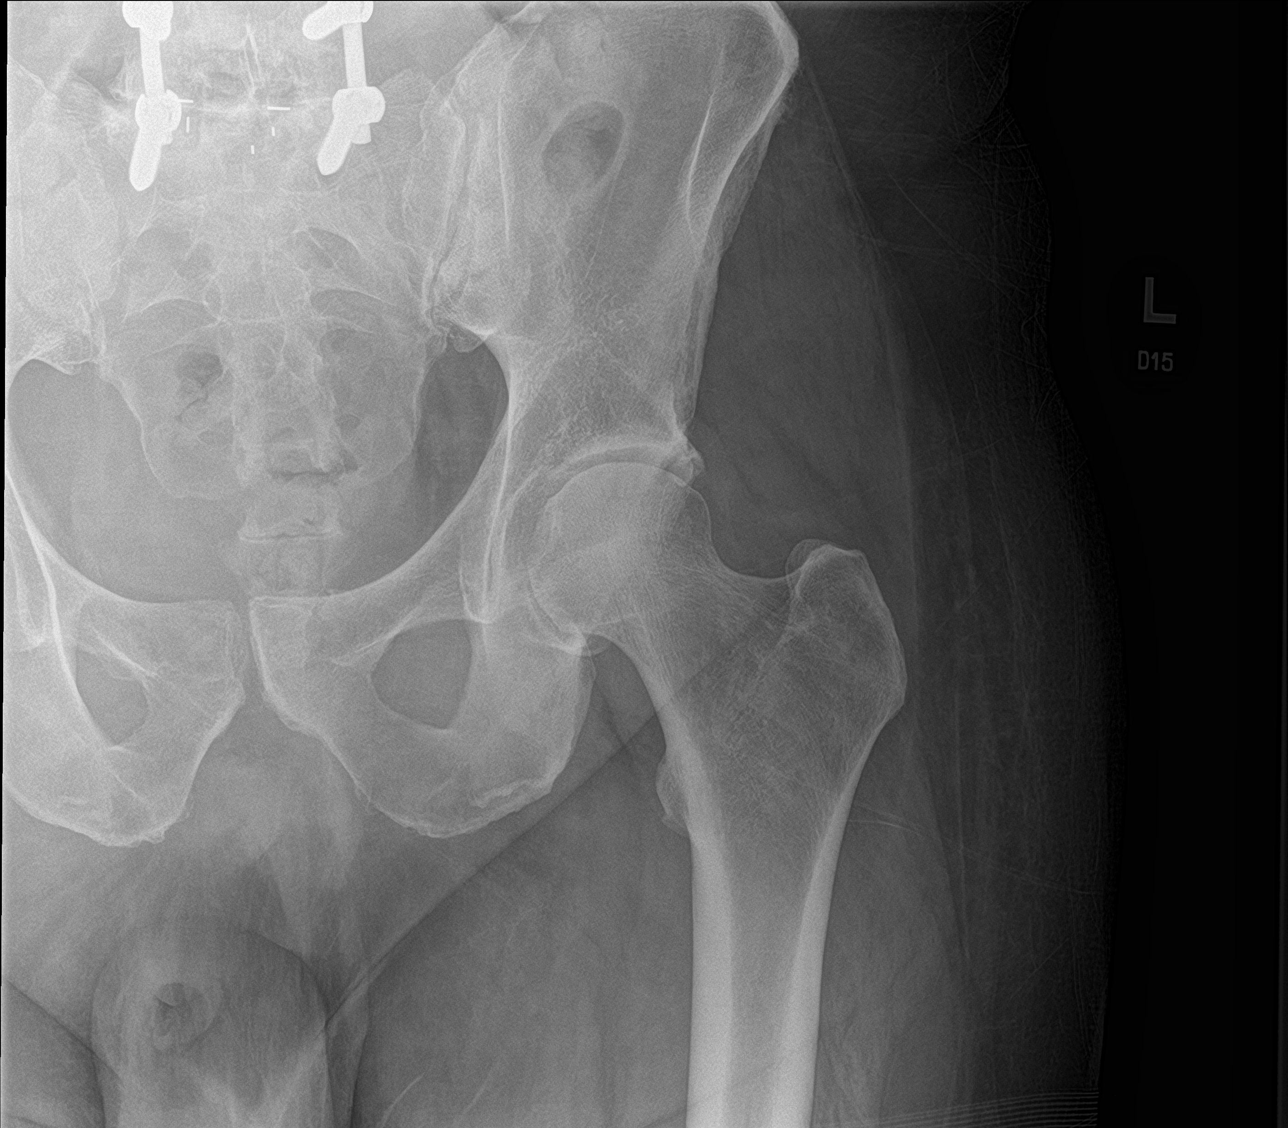

[hip lat]
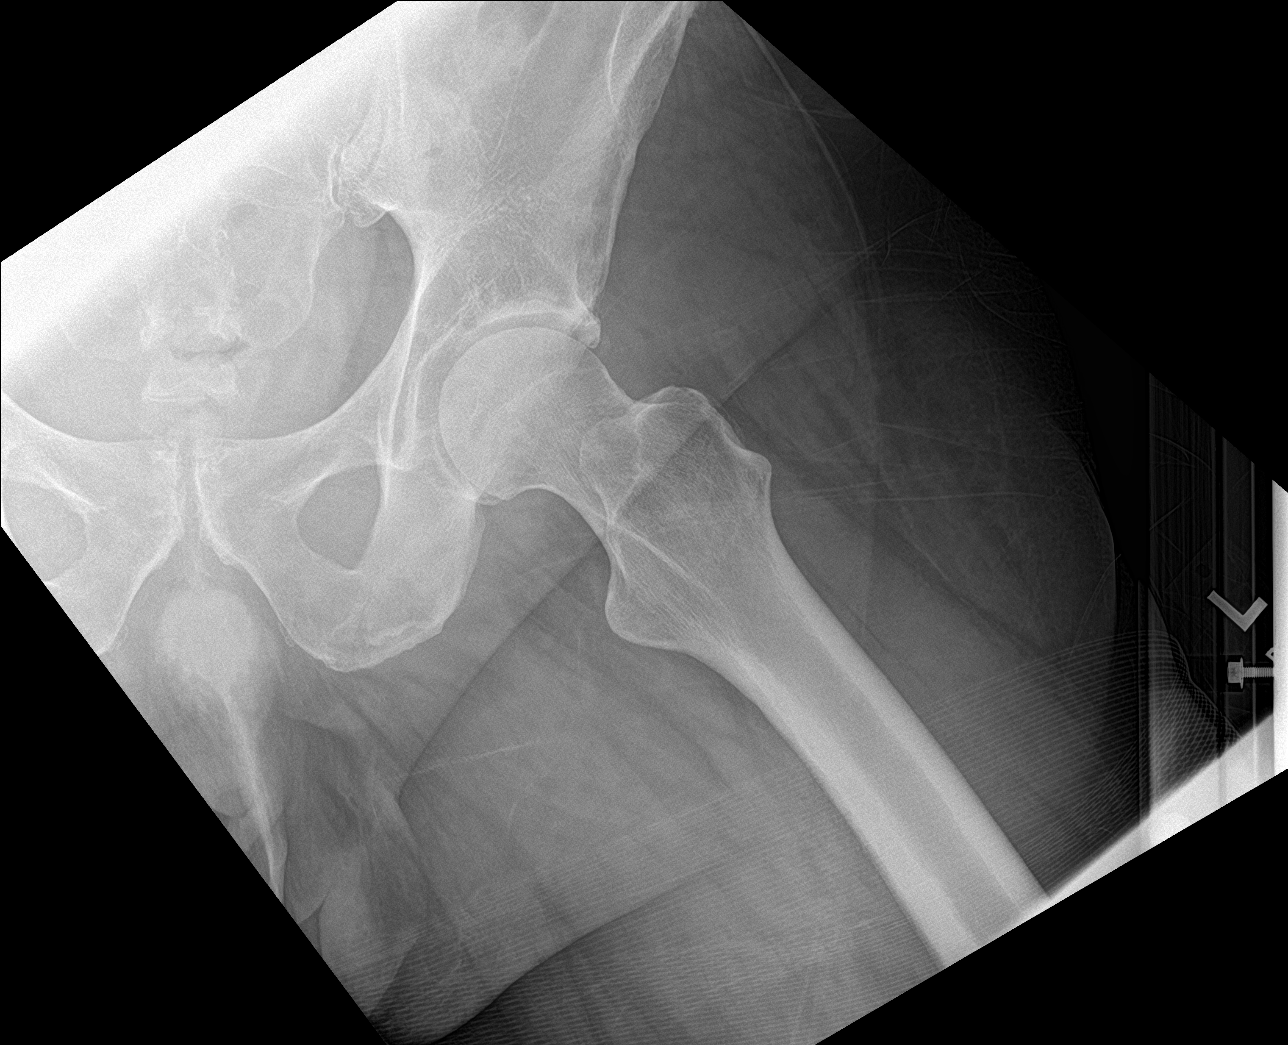

[3 of 3 positions shown; findings below may reference images not displayed]

FINDINGS: There is no evidence of hip fracture or dislocation. Mild bilateral
hip degenerative disease. Postoperative change of lower lumbar
fusion is noted.
IMPRESSION: Mild bilateral hip degenerative disease.

## 2020-12-17 ENCOUNTER — Other Ambulatory Visit: Payer: Self-pay

## 2020-12-17 ENCOUNTER — Encounter: Payer: PPO | Attending: Physical Medicine & Rehabilitation | Admitting: Registered Nurse

## 2020-12-17 VITALS — BP 106/66 | HR 56 | Temp 98.5°F | Ht 73.0 in | Wt 238.0 lb

## 2020-12-17 DIAGNOSIS — Z5181 Encounter for therapeutic drug level monitoring: Secondary | ICD-10-CM | POA: Insufficient documentation

## 2020-12-17 DIAGNOSIS — M5416 Radiculopathy, lumbar region: Secondary | ICD-10-CM | POA: Insufficient documentation

## 2020-12-17 DIAGNOSIS — G894 Chronic pain syndrome: Secondary | ICD-10-CM | POA: Diagnosis not present

## 2020-12-17 DIAGNOSIS — M961 Postlaminectomy syndrome, not elsewhere classified: Secondary | ICD-10-CM | POA: Insufficient documentation

## 2020-12-17 DIAGNOSIS — Z79899 Other long term (current) drug therapy: Secondary | ICD-10-CM | POA: Insufficient documentation

## 2020-12-17 DIAGNOSIS — R001 Bradycardia, unspecified: Secondary | ICD-10-CM | POA: Insufficient documentation

## 2020-12-17 DIAGNOSIS — M62830 Muscle spasm of back: Secondary | ICD-10-CM | POA: Diagnosis not present

## 2020-12-17 DIAGNOSIS — M5136 Other intervertebral disc degeneration, lumbar region: Secondary | ICD-10-CM | POA: Diagnosis not present

## 2020-12-17 MED ORDER — MORPHINE SULFATE ER 30 MG PO TBCR: 30.0000 mg | EXTENDED_RELEASE_TABLET | Freq: Two times a day (BID) | ORAL | 0 refills | Status: DC

## 2020-12-17 MED ORDER — OXYCODONE HCL 10 MG PO TABS: 10.0000 mg | ORAL_TABLET | Freq: Four times a day (QID) | ORAL | 0 refills | Status: DC | PRN

## 2020-12-17 NOTE — Progress Notes (Signed)
Subjective:    Patient ID: Samuel [name], male    DOB: 04-07-1976, 45 y.o.   MRN: [id number]  HPI: Samuel [name] is a 45 y.o. male who returns for follow up appointment for chronic pain and medication refill. He states his  pain is located in his lower back radiating into his bilateral hips ( L>R) and bilateral lower extremities.Marland Kitchen He rates his pain 6. His current exercise regime is walking and performing stretching exercises.  Mr. Auer Morphine equivalent is 96.00  MME.   Last UDS was Performed on 10/11/2020, it was consistent.    Pain Inventory Average Pain 6 Pain Right Now 6 My pain is constant, sharp, burning, dull, stabbing, and aching  In the last 24 hours, has pain interfered with the following? General activity 9 Relation with others 9 Enjoyment of life 9 What TIME of day is your pain at its worst? morning , evening, and night Sleep (in general) Fair  Pain is worse with: walking, bending, sitting, and standing Pain improves with: rest and medication Relief from Meds: 4  Family History  Problem Relation Age of Onset   Hypertension Father    Social History   Socioeconomic History   Marital status: Married    Spouse name: Not on file   Number of children: Not on file   Years of education: Not on file   Highest education level: Not on file  Occupational History   Not on file  Tobacco Use   Smoking status: Never   Smokeless tobacco: Never  Vaping Use   Vaping Use: Never used  Substance and Sexual Activity   Alcohol use: No    Alcohol/week: 0.0 standard drinks   Drug use: No   Sexual activity: Not on file  Other Topics Concern   Not on file  Social History Narrative   Not on file   Social Determinants of Health   Financial Resource Strain: Not on file  Food Insecurity: Not on file  Transportation Needs: Not on file  Physical Activity: Not on file  Stress: Not on file  Social Connections: Not on file   Past Surgical History:  Procedure  Laterality Date   BACK SURGERY     lower back surgery x 2   Past Surgical History:  Procedure Laterality Date   BACK SURGERY     lower back surgery x 2   Past Medical History:  Diagnosis Date   Arthritis    "some in my back"   GERD (gastroesophageal reflux disease)    Headache(784.0)    BP 106/66   Pulse (!) 56   Temp 98.5 F (36.9 C) (Oral)   Ht '6\' 1"'$  (1.854 m)   Wt 238 lb (108 kg)   SpO2 98%   BMI 31.40 kg/m   Opioid Risk Score:   Fall Risk Score:  `1  Depression screen PHQ 2/9  Depression screen [facility] 2/9 11/11/2020 09/10/2020 08/13/2020 07/16/2020 05/14/2020 04/16/2020 02/13/2020  Decreased Interest 0 1 1 0 0 2 1  Down, Depressed, Hopeless 0 1 1 0 0 2 1  PHQ - 2 Score 0 2 2 0 0 4 2  Altered sleeping - - - - - - -  Tired, decreased energy - - - - - - -  Change in appetite - - - - - - -  Feeling bad or failure about yourself  - - - - - - -  Trouble concentrating - - - - - - -  Moving slowly  or fidgety/restless - - - - - - -  Suicidal thoughts - - - - - - -  PHQ-9 Score - - - - - - -  Difficult doing work/chores - - - - - - -  Some recent data might be hidden      Review of Systems  Constitutional: Negative.   HENT: Negative.    Eyes: Negative.   Respiratory: Negative.    Cardiovascular: Negative.   Gastrointestinal: Negative.   Endocrine: Negative.   Genitourinary: Negative.   Musculoskeletal:  Positive for back pain.       Hip pain   Skin: Negative.   Allergic/Immunologic: Negative.   Neurological: Negative.   Hematological: Negative.   Psychiatric/Behavioral: Negative.        Objective:   Physical Exam Vitals and nursing note reviewed.  Constitutional:      Appearance: Normal appearance.  Cardiovascular:     Rate and Rhythm: Normal rate and regular rhythm.     Pulses: Normal pulses.     Heart sounds: Normal heart sounds.  Pulmonary:     Effort: Pulmonary effort is normal.     Breath sounds: Normal breath sounds.  Musculoskeletal:      Cervical back: Normal range of motion and neck supple.     Comments: Normal Muscle Bulk and Muscle Testing Reveals:  Upper Extremities: Full ROM and Muscle Strength  5/5 Lumbar Hypersensitivity Lower Extremities: Decreased ROM and Muscle Strength 5/5 Bilateral Lower Extremities Flexion Produces Pain into his Lumbar and Left hip  Arises from Table Slowly Antalgic Gait     Skin:    General: Skin is warm and dry.  Neurological:     Mental Status: He is alert and oriented to person, place, and time.  Psychiatric:        Mood and Affect: Mood normal.        Behavior: Behavior normal.         Assessment & Plan:  1.  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 He has lumbar degenerative disc disease. Continue current medication regimen with  Gabapentin, Continue current Medication regimen . S/P  Left  Lumbar Facet Radiofrequency Ablation with sedation, on 11/12/2019 by Dr. Dossie Arbour with good results noted and S/P Right RFA with Dr. Lowella Dandy on June 24,2021. Mr. Pangburn was instructed to call Dr Lowella Dandy to schedule injection, he verbalizes understanding. 12/17/2020.  Refilled: MS Contin 30 mg one tablet every 12 hours  #60 and Oxycodone 10 mg one tablet every 6 hours as needed for pain #120. We will continue the opioid monitoring program, this consists of regular clinic visits, examinations, urine drug screen, pill counts as well as use of [place] Controlled Substance Reporting system. A 12 month History has been reviewed on the [place] on 12/17/2020. 2. Sacroiliac Dysfunction: Continue with current medication, heat and exercise regime. 12/17/2020 3. Muscle Spasm: Continue Tizanidine as needed. Continue to Monitor.12/17/2020 4. Chronic Pain Syndrome: Continue MS Contin and Oxycodone as needed for pain. Continue to Monitor. 12/17/2020. 5. Left Greater Trochanteric Tenderness: No complaints today.  Left Hip Osteoarthritis:  S/P  left intra-articular hip injection  with 8 days of relief Mr. Piotrowicz reports. Continue with alternating Ice and Heat Therapy. Continue to monitor. 12/17/2020. 6. Bradycardia: He's prescribed Coreg and his  PCP and Gastroenterologist Following he states. We will continue to monitor.12/17/2020 7. Loss of weight: Gastroenterologist Following. Mr. Winkle states he was instructed to lose weight by his gastroenterologist ,  he is following a health diet regime and exercise. We will continue to monitor. 12/17/2020   F/U in 1 Month

## 2020-12-23 DIAGNOSIS — R932 Abnormal findings on diagnostic imaging of liver and biliary tract: Secondary | ICD-10-CM | POA: Diagnosis not present

## 2020-12-23 DIAGNOSIS — M4726 Other spondylosis with radiculopathy, lumbar region: Secondary | ICD-10-CM | POA: Diagnosis not present

## 2020-12-23 DIAGNOSIS — Z23 Encounter for immunization: Secondary | ICD-10-CM | POA: Diagnosis not present

## 2020-12-23 DIAGNOSIS — K7460 Unspecified cirrhosis of liver: Secondary | ICD-10-CM | POA: Diagnosis not present

## 2020-12-23 DIAGNOSIS — K7689 Other specified diseases of liver: Secondary | ICD-10-CM | POA: Diagnosis not present

## 2020-12-23 DIAGNOSIS — Z6831 Body mass index (BMI) 31.0-31.9, adult: Secondary | ICD-10-CM | POA: Diagnosis not present

## 2020-12-23 DIAGNOSIS — G8929 Other chronic pain: Secondary | ICD-10-CM | POA: Diagnosis not present

## 2020-12-23 DIAGNOSIS — R161 Splenomegaly, not elsewhere classified: Secondary | ICD-10-CM | POA: Diagnosis not present

## 2020-12-23 DIAGNOSIS — D509 Iron deficiency anemia, unspecified: Secondary | ICD-10-CM | POA: Diagnosis not present

## 2020-12-23 DIAGNOSIS — I8510 Secondary esophageal varices without bleeding: Secondary | ICD-10-CM | POA: Diagnosis not present

## 2020-12-23 DIAGNOSIS — Z7984 Long term (current) use of oral hypoglycemic drugs: Secondary | ICD-10-CM | POA: Diagnosis not present

## 2020-12-23 DIAGNOSIS — K219 Gastro-esophageal reflux disease without esophagitis: Secondary | ICD-10-CM | POA: Diagnosis not present

## 2020-12-23 DIAGNOSIS — M810 Age-related osteoporosis without current pathological fracture: Secondary | ICD-10-CM | POA: Diagnosis not present

## 2020-12-23 DIAGNOSIS — Z79899 Other long term (current) drug therapy: Secondary | ICD-10-CM | POA: Diagnosis not present

## 2020-12-24 ENCOUNTER — Encounter: Payer: Self-pay | Admitting: Registered Nurse

## 2020-12-25 DIAGNOSIS — D649 Anemia, unspecified: Secondary | ICD-10-CM | POA: Diagnosis not present

## 2020-12-27 DIAGNOSIS — K7581 Nonalcoholic steatohepatitis (NASH): Secondary | ICD-10-CM | POA: Diagnosis not present

## 2020-12-27 DIAGNOSIS — E119 Type 2 diabetes mellitus without complications: Secondary | ICD-10-CM | POA: Diagnosis not present

## 2020-12-27 DIAGNOSIS — D500 Iron deficiency anemia secondary to blood loss (chronic): Secondary | ICD-10-CM | POA: Diagnosis not present

## 2021-01-16 ENCOUNTER — Encounter: Payer: PPO | Attending: Physical Medicine & Rehabilitation | Admitting: Registered Nurse

## 2021-01-16 ENCOUNTER — Encounter: Payer: Self-pay | Admitting: Registered Nurse

## 2021-01-16 ENCOUNTER — Other Ambulatory Visit: Payer: Self-pay

## 2021-01-16 VITALS — BP 112/69 | HR 64 | Temp 98.3°F | Ht 73.0 in | Wt 237.2 lb

## 2021-01-16 DIAGNOSIS — M7062 Trochanteric bursitis, left hip: Secondary | ICD-10-CM | POA: Insufficient documentation

## 2021-01-16 DIAGNOSIS — M7061 Trochanteric bursitis, right hip: Secondary | ICD-10-CM | POA: Diagnosis not present

## 2021-01-16 DIAGNOSIS — M62830 Muscle spasm of back: Secondary | ICD-10-CM | POA: Insufficient documentation

## 2021-01-16 DIAGNOSIS — M961 Postlaminectomy syndrome, not elsewhere classified: Secondary | ICD-10-CM | POA: Diagnosis not present

## 2021-01-16 DIAGNOSIS — Z79899 Other long term (current) drug therapy: Secondary | ICD-10-CM | POA: Insufficient documentation

## 2021-01-16 DIAGNOSIS — Z5181 Encounter for therapeutic drug level monitoring: Secondary | ICD-10-CM | POA: Insufficient documentation

## 2021-01-16 DIAGNOSIS — G894 Chronic pain syndrome: Secondary | ICD-10-CM | POA: Insufficient documentation

## 2021-01-16 DIAGNOSIS — M5136 Other intervertebral disc degeneration, lumbar region: Secondary | ICD-10-CM | POA: Insufficient documentation

## 2021-01-16 DIAGNOSIS — M5416 Radiculopathy, lumbar region: Secondary | ICD-10-CM | POA: Diagnosis not present

## 2021-01-16 MED ORDER — MORPHINE SULFATE ER 30 MG PO TBCR: 30.0000 mg | EXTENDED_RELEASE_TABLET | Freq: Two times a day (BID) | ORAL | 0 refills | Status: DC

## 2021-01-16 MED ORDER — OXYCODONE HCL 10 MG PO TABS: 10.0000 mg | ORAL_TABLET | Freq: Four times a day (QID) | ORAL | 0 refills | Status: DC | PRN

## 2021-01-16 NOTE — Progress Notes (Signed)
Subjective:    Patient ID: Samuel [name], male    DOB: 1975-09-28, 45 y.o.   MRN: [id number]  HPI: Samuel [name] is a 45 y.o. male who returns for follow up appointment for chronic pain and medication refill. He states his pain is located in his is located in his lower back radiating into his bilateral lower extremities. He rates his pain 6. His current exercise regime is walking and performing stretching exercises.  Samuel [name].   Last UDS was Performed on 10/11/2020, it was consistent.    Pain Inventory Average Pain 6 Pain Right Now 6 My pain is constant, sharp, dull, stabbing, and aching  In the last 24 hours, has pain interfered with the following? General activity 9 Relation with others 9 Enjoyment of life 9 What TIME of day is your pain at its worst? morning , evening, and night Sleep (in general) Fair  Pain is worse with: walking, bending, sitting, inactivity, and standing Pain improves with: rest, heat/ice, and medication Relief from Meds: 4  Family History  Problem Relation Age of Onset  . Hypertension Father    Social History   Socioeconomic History  . Marital status: Married    Spouse name: Not on file  . Number of children: Not on file  . Years of education: Not on file  . Highest education level: Not on file  Occupational History  . Not on file  Tobacco Use  . Smoking status: Never  . Smokeless tobacco: Never  Vaping Use  . Vaping Use: Never used  Substance and Sexual Activity  . Alcohol use: No    Alcohol/week: 0.0 standard drinks  . Drug use: No  . Sexual activity: Not on file  Other Topics Concern  . Not on file  Social History Narrative  . Not on file   Social Determinants of Health   Financial Resource Strain: Not on file  Food Insecurity: Not on file  Transportation Needs: Not on file  Physical Activity: Not on file  Stress: Not on file  Social Connections: Not on file   Past Surgical History:   Procedure Laterality Date  . BACK SURGERY     lower back surgery x 2   Past Surgical History:  Procedure Laterality Date  . BACK SURGERY     lower back surgery x 2   Past Medical History:  Diagnosis Date  . Arthritis    "some in my back"  . GERD (gastroesophageal reflux disease)   . Headache(784.0)    BP 112/69   Pulse 64   Temp 98.3 F (36.8 C)   Ht '6\' 1"'$  (1.854 m)   Wt 237 lb 3.2 oz (107.6 kg)   SpO2 97%   BMI 31.29 kg/m   Opioid Risk Score:   Fall Risk Score:  `1  Depression screen PHQ 2/9  Depression screen [facility] 2/9 11/11/2020 09/10/2020 08/13/2020 07/16/2020 05/14/2020 04/16/2020 02/13/2020  Decreased Interest 0 1 1 0 0 2 1  Down, Depressed, Hopeless 0 1 1 0 0 2 1  PHQ - 2 Score 0 2 2 0 0 4 2  Altered sleeping - - - - - - -  Tired, decreased energy - - - - - - -  Change in appetite - - - - - - -  Feeling bad or failure about yourself  - - - - - - -  Trouble concentrating - - - - - - -  Moving slowly or fidgety/restless - - - - - - -  Suicidal thoughts - - - - - - -  PHQ-9 Score - - - - - - -  Difficult doing work/chores - - - - - - -  Some recent data might be hidden    Review of Systems  Musculoskeletal:  Positive for back pain and gait problem.  All other systems reviewed and are negative.     Objective:   Physical Exam Vitals and nursing note reviewed.  Constitutional:      Appearance: Normal appearance.  Cardiovascular:     Rate and Rhythm: Normal rate and regular rhythm.     Pulses: Normal pulses.     Heart sounds: Normal heart sounds.  Pulmonary:     Effort: Pulmonary effort is normal.     Breath sounds: Normal breath sounds.  Musculoskeletal:     Cervical back: Normal range of motion and neck supple.     Comments: Normal Muscle Bulk and Muscle Testing Reveals:  Upper Extremities: Full ROM and Muscle Strength  5/5 Lumbar Hypersensitivity Bilateral Greater Trochanter Tenderness Lower Extremities: Full ROM and Muscle Strength 5/5 Bilateral  Lower Extremities Flexion Produces Pain into his Lumbar Arises from Table Slowly Antalgic  Gait     Skin:    General: Skin is warm and dry.  Neurological:     Mental Status: He is alert and oriented to person, place, and time.  Psychiatric:        Mood and Affect: Mood normal.        Behavior: Behavior normal.         Assessment & Plan:  1.  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 He has lumbar degenerative disc disease. Continue current medication regimen with  Gabapentin, Continue current Medication regimen . S/P  Left  Lumbar Facet Radiofrequency Ablation with sedation, on 11/12/2019 by Dr. Dossie Arbour with good results noted and S/P Right RFA with Dr. Lowella [name] on June 24,2021. Samuel [name] was [name] to call Dr Lowella [name] to schedule injection, he verbalizes understanding. 01/16/2021.  Refilled: MS Contin 30 mg one tablet every 12 hours  #60 and Oxycodone 10 mg one tablet every 6 hours as needed for pain #120. We will continue the opioid monitoring program, this consists of regular clinic visits, examinations, urine drug screen, pill counts as well as use of [place] Controlled Substance Reporting system. A 12 month History has been reviewed on the [place] on 01/16/2021. 2. Sacroiliac Dysfunction: Continue with current medication, heat and exercise regime. 01/16/2021 3. Muscle Spasm: Continue Tizanidine as needed. Continue to Monitor.01/16/2021 4. Chronic Pain Syndrome: Continue MS Contin and Oxycodone as needed for pain. Continue to Monitor. 01/16/2021. 5. Left Greater Trochanteric Tenderness: No complaints today.  Left Hip Osteoarthritis: S/P  left intra-articular hip injection  with 8 days of relief Samuel [name] reports. Continue with alternating Ice and Heat Therapy. Continue to monitor. 01/16/2021. 6. Bradycardia: He's prescribed Coreg and his  PCP and Gastroenterologist Following he states. We will continue to  monitor.01/16/2021 7. Weight Loss: Gastroenterologist Following. Samuel [name] states he was [name] to lose weight by his gastroenterologist , he is following a health diet regime and exercise. We will continue to monitor. 01/16/2021   F/U in 1 Month

## 2021-02-11 ENCOUNTER — Encounter: Payer: PPO | Attending: Physical Medicine & Rehabilitation | Admitting: Registered Nurse

## 2021-02-11 ENCOUNTER — Other Ambulatory Visit: Payer: Self-pay

## 2021-02-11 ENCOUNTER — Encounter: Payer: Self-pay | Admitting: Registered Nurse

## 2021-02-11 VITALS — BP 121/72 | HR 53 | Temp 98.0°F | Ht 73.0 in | Wt 234.0 lb

## 2021-02-11 DIAGNOSIS — M5416 Radiculopathy, lumbar region: Secondary | ICD-10-CM | POA: Diagnosis not present

## 2021-02-11 DIAGNOSIS — M62830 Muscle spasm of back: Secondary | ICD-10-CM | POA: Diagnosis not present

## 2021-02-11 DIAGNOSIS — Z79899 Other long term (current) drug therapy: Secondary | ICD-10-CM | POA: Diagnosis not present

## 2021-02-11 DIAGNOSIS — M5136 Other intervertebral disc degeneration, lumbar region: Secondary | ICD-10-CM | POA: Insufficient documentation

## 2021-02-11 DIAGNOSIS — M961 Postlaminectomy syndrome, not elsewhere classified: Secondary | ICD-10-CM | POA: Diagnosis not present

## 2021-02-11 DIAGNOSIS — G894 Chronic pain syndrome: Secondary | ICD-10-CM | POA: Insufficient documentation

## 2021-02-11 DIAGNOSIS — Z5181 Encounter for therapeutic drug level monitoring: Secondary | ICD-10-CM | POA: Diagnosis not present

## 2021-02-11 MED ORDER — OXYCODONE HCL 10 MG PO TABS: 10.0000 mg | ORAL_TABLET | Freq: Four times a day (QID) | ORAL | 0 refills | Status: DC | PRN

## 2021-02-11 MED ORDER — MORPHINE SULFATE ER 30 MG PO TBCR: 30.0000 mg | EXTENDED_RELEASE_TABLET | Freq: Two times a day (BID) | ORAL | 0 refills | Status: DC

## 2021-02-11 NOTE — Progress Notes (Signed)
Subjective:    Patient ID: Samuel [name], male    DOB: 08/15/1975, 45 y.o.   MRN: [id number]  HPI: Samuel [name] is a 45 y.o. male who returns for follow up appointment for chronic pain and medication refill. He states his pain is located in his  lower back radiating into his bilateral lower extremities. He rates his pain 7. His current exercise regime is walking and performing stretching exercises.  Mr. [place] Morphine equivalent is 120.00 MME.   Last UDS was Performed on 10/11/2020, it was consistent.     Pain Inventory Average Pain 6 Pain Right Now 7 My pain is sharp, dull, stabbing, and aching  In the last 24 hours, has pain interfered with the following? General activity 9 Relation with others 9 Enjoyment of life 9 What TIME of day is your pain at its worst? morning , evening, and night Sleep (in general) Poor  Pain is worse with: walking, bending, sitting, inactivity, standing, and some activites Pain improves with: rest, heat/ice, and medication Relief from Meds: 4  Family History  Problem Relation Age of Onset   Hypertension Father    Social History   Socioeconomic History   Marital status: Married    Spouse name: Not on file   Number of children: Not on file   Years of education: Not on file   Highest education level: Not on file  Occupational History   Not on file  Tobacco Use   Smoking status: Never   Smokeless tobacco: Never  Vaping Use   Vaping Use: Never used  Substance and Sexual Activity   Alcohol use: No    Alcohol/week: 0.0 standard drinks   Drug use: No   Sexual activity: Not on file  Other Topics Concern   Not on file  Social History Narrative   Not on file   Social Determinants of Health   Financial Resource Strain: Not on file  Food Insecurity: Not on file  Transportation Needs: Not on file  Physical Activity: Not on file  Stress: Not on file  Social Connections: Not on file   Past Surgical History:  Procedure Laterality  Date   BACK SURGERY     lower back surgery x 2   Past Surgical History:  Procedure Laterality Date   BACK SURGERY     lower back surgery x 2   Past Medical History:  Diagnosis Date   Arthritis    "some in my back"   GERD (gastroesophageal reflux disease)    Headache(784.0)    There were no vitals taken for this visit.  Opioid Risk Score:   Fall Risk Score:  `1  Depression screen PHQ 2/9  Depression screen [facility] 2/9 01/16/2021 11/11/2020 09/10/2020 08/13/2020 07/16/2020 05/14/2020 04/16/2020  Decreased Interest 1 0 1 1 0 0 2  Down, Depressed, Hopeless 1 0 1 1 0 0 2  PHQ - 2 Score 2 0 2 2 0 0 4  Altered sleeping - - - - - - -  Tired, decreased energy - - - - - - -  Change in appetite - - - - - - -  Feeling bad or failure about yourself  - - - - - - -  Trouble concentrating - - - - - - -  Moving slowly or fidgety/restless - - - - - - -  Suicidal thoughts - - - - - - -  PHQ-9 Score - - - - - - -  Difficult doing work/chores - - - - - - -  Some recent data might be hidden     Review of Systems  Constitutional: Negative.   HENT: Negative.    Eyes: Negative.   Respiratory: Negative.    Cardiovascular: Negative.   Gastrointestinal: Negative.   Endocrine: Negative.   Genitourinary: Negative.   Musculoskeletal:  Positive for back pain and gait problem.  Skin: Negative.   Allergic/Immunologic: Negative.   Hematological: Negative.   Psychiatric/Behavioral: Negative.    All other systems reviewed and are negative.     Objective:   Physical Exam Vitals and nursing note reviewed.  Constitutional:      Appearance: Normal appearance.  Cardiovascular:     Rate and Rhythm: Normal rate and regular rhythm.     Pulses: Normal pulses.     Heart sounds: Normal heart sounds.  Pulmonary:     Effort: Pulmonary effort is normal.     Breath sounds: Normal breath sounds.  Musculoskeletal:     Cervical back: Normal range of motion and neck supple.     Comments: Normal Muscle Bulk and  Muscle Testing Reveals:  Upper Extremities: Full ROM and Muscle Strength  5/5  Lumbar Hypersensitivity Lower Extremities: Decreased ROM and Muscle Strength 5/5 Bilateral Lower Extremities Flexion Produces Pain into his Lumbar Arises from Table Slowly using cane for support Antalgic Gait     Skin:    General: Skin is warm and dry.  Neurological:     Mental Status: He is alert and oriented to person, place, and time.  Psychiatric:        Mood and Affect: Mood normal.        Behavior: Behavior normal.         Assessment & Plan:  1.  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 He has lumbar degenerative disc disease. Continue current medication regimen with  Gabapentin, Continue current Medication regimen . S/P  Left  Lumbar Facet Radiofrequency Ablation with sedation, on 11/12/2019 by Dr. Dossie Arbour with good results noted and S/P Right RFA with Dr. Lowella Dandy on June 24,2021. Mr. Scearce was instructed to call Dr Lowella Dandy to schedule injection, he verbalizes understanding. 02/11/2021.  Refilled: MS Contin 30 mg one tablet every 12 hours  #60 and Oxycodone 10 mg one tablet every 6 hours as needed for pain #120. We will continue the opioid monitoring program, this consists of regular clinic visits, examinations, urine drug screen, pill counts as well as use of [place] Controlled Substance Reporting system. A 12 month History has been reviewed on the [place] on 02/11/2021. 2. Sacroiliac Dysfunction: Continue with current medication, heat and exercise regime. 02/11/2021 3. Muscle Spasm: Continue Tizanidine as needed. Continue to Monitor.02/11/2021 4. Chronic Pain Syndrome: Continue MS Contin and Oxycodone as needed for pain. Continue to Monitor. 02/11/2021. 5. Left Greater Trochanteric Tenderness: No complaints today.  Left Hip Osteoarthritis: S/P  left intra-articular hip injection  with 8 days of relief Mr. Stork reports.  Continue with alternating Ice and Heat Therapy. Continue to monitor. 02/11/2021. 6. Bradycardia: He's prescribed Coreg and his  PCP and Gastroenterologist Following he states. We will continue to monitor.02/11/2021 7. Weight Loss: Gastroenterologist Following. Mr. Ridlon states he was instructed to lose weight by his gastroenterologist , he is following a health diet regime and exercise. We will continue to monitor. 02/11/2021   F/U in 1 Month

## 2021-02-14 MED ORDER — TIZANIDINE HCL 2 MG PO TABS: 2.0000 mg | ORAL_TABLET | Freq: Three times a day (TID) | ORAL | 3 refills | Status: DC | PRN

## 2021-03-18 ENCOUNTER — Encounter: Payer: PPO | Attending: Physical Medicine & Rehabilitation | Admitting: Registered Nurse

## 2021-03-18 ENCOUNTER — Encounter: Payer: Self-pay | Admitting: Registered Nurse

## 2021-03-18 ENCOUNTER — Other Ambulatory Visit: Payer: Self-pay

## 2021-03-18 VITALS — BP 116/66 | HR 54 | Ht 73.0 in | Wt 234.0 lb

## 2021-03-18 DIAGNOSIS — R001 Bradycardia, unspecified: Secondary | ICD-10-CM | POA: Diagnosis not present

## 2021-03-18 DIAGNOSIS — M5416 Radiculopathy, lumbar region: Secondary | ICD-10-CM | POA: Insufficient documentation

## 2021-03-18 DIAGNOSIS — Z79899 Other long term (current) drug therapy: Secondary | ICD-10-CM | POA: Insufficient documentation

## 2021-03-18 DIAGNOSIS — G894 Chronic pain syndrome: Secondary | ICD-10-CM | POA: Insufficient documentation

## 2021-03-18 DIAGNOSIS — M5136 Other intervertebral disc degeneration, lumbar region: Secondary | ICD-10-CM | POA: Diagnosis not present

## 2021-03-18 DIAGNOSIS — M961 Postlaminectomy syndrome, not elsewhere classified: Secondary | ICD-10-CM | POA: Insufficient documentation

## 2021-03-18 DIAGNOSIS — M62830 Muscle spasm of back: Secondary | ICD-10-CM | POA: Insufficient documentation

## 2021-03-18 DIAGNOSIS — Z5181 Encounter for therapeutic drug level monitoring: Secondary | ICD-10-CM | POA: Insufficient documentation

## 2021-03-18 MED ORDER — OXYCODONE HCL 10 MG PO TABS: 10.0000 mg | ORAL_TABLET | Freq: Four times a day (QID) | ORAL | 0 refills | Status: DC | PRN

## 2021-03-18 MED ORDER — MORPHINE SULFATE ER 30 MG PO TBCR: 30.0000 mg | EXTENDED_RELEASE_TABLET | Freq: Two times a day (BID) | ORAL | 0 refills | Status: DC

## 2021-03-18 NOTE — Progress Notes (Signed)
Subjective:    Patient ID: Samuel [name], male    DOB: [birth year], 45 y.o.   MRN: [id number]  HPI: Samuel [name] is a 45 y.o. male who returns for follow up appointment for chronic pain and medication refill. He states his pain is located in his lower back. He rates his pain 6. His current exercise regime is walking and performing stretching exercises.  Samuel [name].   Last UDS was Performed on 10/11/2020, it was consistent.      Pain Inventory Average Pain 5 Pain Right Now 6 My pain is constant, sharp, burning, dull, stabbing, and aching  In the last 24 hours, has pain interfered with the following? General activity 9 Relation with others 9 Enjoyment of life 9 What TIME of day is your pain at its worst? morning , evening, and night Sleep (in general) Fair  Pain is worse with: walking, bending, sitting, inactivity, and standing Pain improves with: rest and medication Relief from Meds: 4  Family History  Problem Relation Age of Onset   Hypertension Father    Social History   Socioeconomic History   Marital status: Married    Spouse name: Not on file   Number of children: Not on file   Years of education: Not on file   Highest education level: Not on file  Occupational History   Not on file  Tobacco Use   Smoking status: Never   Smokeless tobacco: Never  Vaping Use   Vaping Use: Never used  Substance and Sexual Activity   Alcohol use: No    Alcohol/week: 0.0 standard drinks   Drug use: No   Sexual activity: Not on file  Other Topics Concern   Not on file  Social History Narrative   Not on file   Social Determinants of Health   Financial Resource Strain: Not on file  Food Insecurity: Not on file  Transportation Needs: Not on file  Physical Activity: Not on file  Stress: Not on file  Social Connections: Not on file   Past Surgical History:  Procedure Laterality Date   BACK SURGERY     lower back surgery x 2    Past Surgical History:  Procedure Laterality Date   BACK SURGERY     lower back surgery x 2   Past Medical History:  Diagnosis Date   Arthritis    "some in my back"   GERD (gastroesophageal reflux disease)    Headache(784.0)    BP 116/66   Pulse (!) 52   Ht 6\' 1"  (1.854 m)   Wt 234 lb (106.1 kg)   SpO2 99%   BMI 30.87 kg/m   Opioid Risk Score:   Fall Risk Score:  `1  Depression screen PHQ 2/9  Depression screen [facility] 2/9 02/11/2021 01/16/2021 11/11/2020 09/10/2020 08/13/2020 07/16/2020 05/14/2020  Decreased Interest 1 1 0 1 1 0 0  Down, Depressed, Hopeless 1 1 0 1 1 0 0  PHQ - 2 Score 2 2 0 2 2 0 0  Altered sleeping - - - - - - -  Tired, decreased energy - - - - - - -  Change in appetite - - - - - - -  Feeling bad or failure about yourself  - - - - - - -  Trouble concentrating - - - - - - -  Moving slowly or fidgety/restless - - - - - - -  Suicidal thoughts - - - - - - -  PHQ-9 Score - - - - - - -  Difficult doing work/chores - - - - - - -  Some recent data might be hidden    Review of Systems  Musculoskeletal:  Positive for back pain and gait problem.  All other systems reviewed and are negative.     Objective:   Physical Exam Vitals and nursing note reviewed.  Constitutional:      Appearance: Normal appearance.  Cardiovascular:     Rate and Rhythm: Normal rate and regular rhythm.     Pulses: Normal pulses.     Heart sounds: Normal heart sounds.  Pulmonary:     Effort: Pulmonary effort is normal.     Breath sounds: Normal breath sounds.  Musculoskeletal:     Cervical back: Normal range of motion and neck supple.     Comments: Normal Muscle Bulk and Muscle Testing Reveals:  Upper Extremities: Full ROM and Muscle Strength 5/5 Lumbar Hypersensitivity Lower Extremities: Decreased ROM and Muscle Strength 4/5 Bilateral Lower Extremities Flexion Produces Pain into his Lumbar Arises from Table slowly using cane for support Antalgic  Gait     Skin:    General:  Skin is warm and dry.  Neurological:     Mental Status: He is alert and oriented to person, place, and time.  Psychiatric:        Mood and Affect: Mood normal.        Behavior: Behavior normal.         Assessment & Plan:  1.  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 He has lumbar degenerative disc disease. Continue current medication regimen with  Gabapentin, Continue current Medication regimen . S/P  Left  Lumbar Facet Radiofrequency Ablation with sedation, on 11/12/2019 by Dr. Dossie Arbour with good results noted and S/P Right RFA with Dr. Lowella Dandy on June 24,2021. Samuel [name] was instructed to call Dr Lowella Dandy to schedule injection, he verbalizes understanding. 03/18/2021.  Refilled: MS Contin 30 mg one tablet every 12 hours  #60 and Oxycodone 10 mg one tablet every 6 hours as needed for pain #120. We will continue the opioid monitoring program, this consists of regular clinic visits, examinations, urine drug screen, pill counts as well as use of [place] Controlled Substance Reporting system. A 12 month History has been reviewed on the [place] on 03/18/2021. 2. Sacroiliac Dysfunction: Continue with current medication, heat and exercise regime. 03/18/2021 3. Muscle Spasm: Continue Tizanidine as needed. Continue to Monitor.03/18/2021 4. Chronic Pain Syndrome: Continue MS Contin and Oxycodone as needed for pain. Continue to Monitor. 03/18/2021. 5. Left Greater Trochanteric Tenderness: No complaints today.  Left Hip Osteoarthritis: S/P  left intra-articular hip injection  with 8 days of relief Samuel [name]. Continue with alternating Ice and Heat Therapy. Continue to monitor. 03/18/2021. 6. Bradycardia: He's prescribed Coreg and his  PCP and Gastroenterologist Following he states. We will continue to monitor.03/18/2021 7. Weight Loss: Gastroenterologist Following. Samuel [name] states he was instructed to lose weight by his  gastroenterologist , he is following a health diet regime and exercise. We will continue to monitor. 03/18/2021   F/U in 1 Month

## 2021-04-02 DIAGNOSIS — D649 Anemia, unspecified: Secondary | ICD-10-CM | POA: Diagnosis not present

## 2021-04-14 ENCOUNTER — Encounter: Payer: PPO | Attending: Physical Medicine & Rehabilitation | Admitting: Registered Nurse

## 2021-04-14 ENCOUNTER — Encounter: Payer: Self-pay | Admitting: Registered Nurse

## 2021-04-14 ENCOUNTER — Other Ambulatory Visit: Payer: Self-pay

## 2021-04-14 VITALS — Ht 73.0 in | Wt 236.0 lb

## 2021-04-14 DIAGNOSIS — M961 Postlaminectomy syndrome, not elsewhere classified: Secondary | ICD-10-CM | POA: Insufficient documentation

## 2021-04-14 DIAGNOSIS — M5416 Radiculopathy, lumbar region: Secondary | ICD-10-CM | POA: Diagnosis not present

## 2021-04-14 DIAGNOSIS — Z5181 Encounter for therapeutic drug level monitoring: Secondary | ICD-10-CM | POA: Insufficient documentation

## 2021-04-14 DIAGNOSIS — G894 Chronic pain syndrome: Secondary | ICD-10-CM | POA: Diagnosis not present

## 2021-04-14 DIAGNOSIS — M5136 Other intervertebral disc degeneration, lumbar region: Secondary | ICD-10-CM | POA: Diagnosis not present

## 2021-04-14 DIAGNOSIS — Z79899 Other long term (current) drug therapy: Secondary | ICD-10-CM | POA: Diagnosis not present

## 2021-04-14 MED ORDER — MORPHINE SULFATE ER 30 MG PO TBCR: 30.0000 mg | EXTENDED_RELEASE_TABLET | Freq: Two times a day (BID) | ORAL | 0 refills | Status: DC

## 2021-04-14 MED ORDER — OXYCODONE HCL 10 MG PO TABS: 10.0000 mg | ORAL_TABLET | Freq: Four times a day (QID) | ORAL | 0 refills | Status: DC | PRN

## 2021-04-14 NOTE — Progress Notes (Signed)
Subjective:    Patient ID: Samuel [name], male    DOB: 1975/11/03, 45 y.o.   MRN: [id number]  HPI: Samuel [name] is a 45 y.o. male who is scheduled for a My-Chart Video visit today, Samuel [name] agrees with My-Chart Video visit and verbalizes understanding. He states his pain is located in his lower back. He rates his pain 6.His  current exercise regime is walking and performing stretching exercises.  Samuel [name] equivalent is 120.00 MME.   Last UDS was Performed on 10/11/2020     Pain Inventory Average Pain 6 Pain Right Now 6 My pain is constant, sharp, dull, stabbing, tingling, and aching  In the last 24 hours, has pain interfered with the following? General activity 9 Relation with others 9 Enjoyment of life 9 What TIME of day is your pain at its worst? morning  and night Sleep (in general) Fair  Pain is worse with: walking, bending, and standing Pain improves with: rest and medication Relief from Meds: 5  Family History  Problem Relation Age of Onset   Hypertension Father    Social History   Socioeconomic History   Marital status: Married    Spouse name: Not on file   Number of children: Not on file   Years of education: Not on file   Highest education level: Not on file  Occupational History   Not on file  Tobacco Use   Smoking status: Never   Smokeless tobacco: Never  Vaping Use   Vaping Use: Never used  Substance and Sexual Activity   Alcohol use: No    Alcohol/week: 0.0 standard drinks   Drug use: No   Sexual activity: Not on file  Other Topics Concern   Not on file  Social History Narrative   Not on file   Social Determinants of Health   Financial Resource Strain: Not on file  Food Insecurity: Not on file  Transportation Needs: Not on file  Physical Activity: Not on file  Stress: Not on file  Social Connections: Not on file   Past Surgical History:  Procedure Laterality Date   BACK SURGERY     lower back surgery x 2   Past  Surgical History:  Procedure Laterality Date   BACK SURGERY     lower back surgery x 2   Past Medical History:  Diagnosis Date   Arthritis    "some in my back"   GERD (gastroesophageal reflux disease)    Headache(784.0)    There were no vitals taken for this visit.  Opioid Risk Score:   Fall Risk Score:  `1  Depression screen PHQ 2/9  Depression screen [facility] 2/9 03/18/2021 02/11/2021 01/16/2021 11/11/2020 09/10/2020 08/13/2020 07/16/2020  Decreased Interest 1 1 1  0 1 1 0  Down, Depressed, Hopeless 1 1 1  0 1 1 0  PHQ - 2 Score 2 2 2  0 2 2 0  Altered sleeping - - - - - - -  Tired, decreased energy - - - - - - -  Change in appetite - - - - - - -  Feeling bad or failure about yourself  - - - - - - -  Trouble concentrating - - - - - - -  Moving slowly or fidgety/restless - - - - - - -  Suicidal thoughts - - - - - - -  PHQ-9 Score - - - - - - -  Difficult doing work/chores - - - - - - -  Some recent data might be hidden    Review of Systems  Musculoskeletal:  Positive for back pain and gait problem.  All other systems reviewed and are negative.     Objective:   Physical Exam Vitals and nursing note reviewed.  Musculoskeletal:     Comments: No Physical Exam Performed: My-Chart Video Visit         Assessment & Plan:  1.  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 He has lumbar degenerative disc disease. Continue current medication regimen with  Gabapentin, Continue current Medication regimen . S/P  Left  Lumbar Facet Radiofrequency Ablation with sedation, on 11/12/2019 by Dr. Dossie Arbour with good results noted and S/P Right RFA with Dr. Lowella Dandy on June 24,2021. Samuel [name] was instructed to call Dr Lowella Dandy to schedule injection, he verbalizes understanding. 04/14/2021.  Refilled: MS Contin 30 mg one tablet every 12 hours  #60 and Oxycodone 10 mg one tablet every 6 hours as needed for pain #120. We will continue the opioid monitoring program, this consists  of regular clinic visits, examinations, urine drug screen, pill counts as well as use of [place] Controlled Substance Reporting system. A 12 month History has been reviewed on the [place] on 04/14/2021. 2. Sacroiliac Dysfunction: Continue with current medication, heat and exercise regime. 04/14/2021 3. Muscle Spasm: Continue Tizanidine as needed. Continue to Monitor.04/14/2021 4. Chronic Pain Syndrome: Continue MS Contin and Oxycodone as needed for pain. Continue to Monitor. 04/14/2021. 5. Left Greater Trochanteric Tenderness: No complaints today.  Left Hip Osteoarthritis: S/P  left intra-articular hip injection  with 8 days of relief Samuel [name]. Continue with alternating Ice and Heat Therapy. Continue to monitor. 04/14/2021. 6. Bradycardia: No vitals today, continue to monitor. He's prescribed Coreg and his  PCP and Gastroenterologist Following he states. We will continue to monitor.04/14/2021 7. Weight Loss: Gastroenterologist Following. Samuel [name] states he was instructed to lose weight by his gastroenterologist , he is following a healthy diet regime and exercise. We will continue to monitor. 04/14/2021   F/U in 1 Month  My-Chart Video Visit Established Patient Location of Patient: In his Home Location of Provider: In the Office

## 2021-04-21 MED ORDER — OXYCODONE HCL 10 MG PO TABS: 10.0000 mg | ORAL_TABLET | Freq: Four times a day (QID) | ORAL | 0 refills | Status: DC | PRN

## 2021-05-13 ENCOUNTER — Encounter: Payer: PPO | Attending: Physical Medicine & Rehabilitation | Admitting: Registered Nurse

## 2021-05-13 ENCOUNTER — Encounter: Payer: Self-pay | Admitting: Registered Nurse

## 2021-05-13 ENCOUNTER — Other Ambulatory Visit: Payer: Self-pay

## 2021-05-13 VITALS — BP 133/73 | HR 58 | Temp 98.2°F | Ht 73.0 in | Wt 243.0 lb

## 2021-05-13 DIAGNOSIS — R001 Bradycardia, unspecified: Secondary | ICD-10-CM | POA: Diagnosis not present

## 2021-05-13 DIAGNOSIS — M7061 Trochanteric bursitis, right hip: Secondary | ICD-10-CM

## 2021-05-13 DIAGNOSIS — M62830 Muscle spasm of back: Secondary | ICD-10-CM | POA: Diagnosis not present

## 2021-05-13 DIAGNOSIS — Z5181 Encounter for therapeutic drug level monitoring: Secondary | ICD-10-CM | POA: Diagnosis not present

## 2021-05-13 DIAGNOSIS — M961 Postlaminectomy syndrome, not elsewhere classified: Secondary | ICD-10-CM | POA: Diagnosis not present

## 2021-05-13 DIAGNOSIS — M7062 Trochanteric bursitis, left hip: Secondary | ICD-10-CM | POA: Insufficient documentation

## 2021-05-13 DIAGNOSIS — M5416 Radiculopathy, lumbar region: Secondary | ICD-10-CM

## 2021-05-13 DIAGNOSIS — M5136 Other intervertebral disc degeneration, lumbar region: Secondary | ICD-10-CM | POA: Diagnosis not present

## 2021-05-13 DIAGNOSIS — Z79899 Other long term (current) drug therapy: Secondary | ICD-10-CM

## 2021-05-13 DIAGNOSIS — G894 Chronic pain syndrome: Secondary | ICD-10-CM

## 2021-05-13 DIAGNOSIS — M51369 Other intervertebral disc degeneration, lumbar region without mention of lumbar back pain or lower extremity pain: Secondary | ICD-10-CM

## 2021-05-13 MED ORDER — MORPHINE SULFATE ER 30 MG PO TBCR: 30.0000 mg | EXTENDED_RELEASE_TABLET | Freq: Two times a day (BID) | ORAL | 0 refills | Status: DC

## 2021-05-13 MED ORDER — OXYCODONE HCL 10 MG PO TABS: 10.0000 mg | ORAL_TABLET | Freq: Four times a day (QID) | ORAL | 0 refills | Status: DC | PRN

## 2021-05-13 NOTE — Progress Notes (Signed)
Subjective:    Patient ID: Samuel [name], male    DOB: 12/28/1975, 45 y.o.   MRN: [id number]  HPI: Samuel [name] is a 45 y.o. male who returns for follow up appointment for chronic pain and medication refill. He states his  pain is located in his lower back radiating into his bilateral hips and bilateral lower extremities. He rates his pain 6. His current exercise regime is walking and performing stretching exercises.  Samuel [name] Morphine equivalent is 60.00 MME.   UDS ordered today.     Pain Inventory Average Pain 6 Pain Right Now 6 My pain is constant, sharp, dull, stabbing, and aching  In the last 24 hours, has pain interfered with the following? General activity 9 Relation with others 9 Enjoyment of life 9 What TIME of day is your pain at its worst? morning , evening, and night Sleep (in general) Fair  Pain is worse with: walking, bending, sitting, inactivity, and standing Pain improves with: rest and medication Relief from Meds: 5  Family History  Problem Relation Age of Onset   Hypertension Father    Social History   Socioeconomic History   Marital status: Married    Spouse name: Not on file   Number of children: Not on file   Years of education: Not on file   Highest education level: Not on file  Occupational History   Not on file  Tobacco Use   Smoking status: Never   Smokeless tobacco: Never  Vaping Use   Vaping Use: Never used  Substance and Sexual Activity   Alcohol use: No    Alcohol/week: 0.0 standard drinks   Drug use: No   Sexual activity: Not on file  Other Topics Concern   Not on file  Social History Narrative   Not on file   Social Determinants of Health   Financial Resource Strain: Not on file  Food Insecurity: Not on file  Transportation Needs: Not on file  Physical Activity: Not on file  Stress: Not on file  Social Connections: Not on file   Past Surgical History:  Procedure Laterality Date   BACK SURGERY     lower back  surgery x 2   Past Surgical History:  Procedure Laterality Date   BACK SURGERY     lower back surgery x 2   Past Medical History:  Diagnosis Date   Arthritis    "some in my back"   GERD (gastroesophageal reflux disease)    Headache(784.0)    There were no vitals taken for this visit.  Opioid Risk Score:   Fall Risk Score:  `1  Depression screen PHQ 2/9  Depression screen [facility] 2/9 04/14/2021 03/18/2021 02/11/2021 01/16/2021 11/11/2020 09/10/2020 08/13/2020  Decreased Interest 0 1 1 1  0 1 1  Down, Depressed, Hopeless 0 1 1 1  0 1 1  PHQ - 2 Score 0 2 2 2  0 2 2  Some recent data might be hidden    Review of Systems  Musculoskeletal:  Positive for back pain and gait problem.       Pain in both hips  All other systems reviewed and are negative.     Objective:   Physical Exam Vitals and nursing note reviewed.  Constitutional:      Appearance: Normal appearance.  Cardiovascular:     Rate and Rhythm: Normal rate and regular rhythm.     Pulses: Normal pulses.     Heart sounds: Normal heart sounds.  Pulmonary:  Effort: Pulmonary effort is normal.     Breath sounds: Normal breath sounds.  Musculoskeletal:     Cervical back: Normal range of motion and neck supple.     Comments: Normal Muscle Bulk and Muscle Testing Reveals:  Upper Extremities: Full ROM and Muscle Strength 5/5 Lumbar Hypersensitivity Lower Extremities: Decreased ROM and Muscle Strength 5/5 Bilateral Lower Extremities Flexion Produces Pain into his Lower Back and Bilateral Lower Extremities Arises from Table Slowly Antalgic Gait     Skin:    General: Skin is warm and dry.  Neurological:     Mental Status: He is alert and oriented to person, place, and time.  Psychiatric:        Mood and Affect: Mood normal.        Behavior: Behavior normal.         Assessment & Plan:  1.  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 He has lumbar degenerative disc disease. Continue  current medication regimen with  Gabapentin, Continue current Medication regimen . S/P  Left  Lumbar Facet Radiofrequency Ablation with sedation, on 11/12/2019 by Dr. Dossie Arbour with good results noted and S/P Right RFA with Dr. Lowella Dandy on June 24,2021. Samuel [name] was instructed to call Dr Lowella Dandy to schedule injection, he verbalizes understanding. 05/13/2021.  Refilled: MS Contin 30 mg one tablet every 12 hours  #60 and Oxycodone 10 mg one tablet every 6 hours as needed for pain #120. We will continue the opioid monitoring program, this consists of regular clinic visits, examinations, urine drug screen, pill counts as well as use of [place] Controlled Substance Reporting system. A 12 month History has been reviewed on the [place] on 05/13/2021. 2. Sacroiliac Dysfunction: Continue with current medication, heat and exercise regime. 05/13/2021 3. Muscle Spasm: Continue Tizanidine as needed. Continue to Monitor.05/13/2021 4. Chronic Pain Syndrome: Continue MS Contin and Oxycodone as needed for pain. Continue to Monitor. 05/13/2021. 5. Left Greater Trochanteric Tenderness: No complaints today.  Left Hip Osteoarthritis: S/P  left intra-articular hip injection  with 8 days of relief Samuel [name] reports. Continue with alternating Ice and Heat Therapy. Continue to monitor. 05/13/2021. 6. Bradycardia: No vitals today, continue to monitor. He's prescribed Coreg and his  PCP and Gastroenterologist Following he states. We will continue to monitor.05/13/2021 7. Weight Loss: Gastroenterologist Following. Samuel [name] states he was instructed to lose weight by his gastroenterologist , he is following a healthy diet regime and exercise. We will continue to monitor. 05/13/2021

## 2021-05-22 ENCOUNTER — Telehealth: Payer: Self-pay

## 2021-05-22 ENCOUNTER — Telehealth: Payer: Self-pay | Admitting: Registered Nurse

## 2021-05-22 LAB — TOXASSURE SELECT,+ANTIDEPR,UR

## 2021-05-22 MED ORDER — MORPHINE SULFATE ER 30 MG PO TBCR: 30.0000 mg | EXTENDED_RELEASE_TABLET | Freq: Two times a day (BID) | ORAL | 0 refills | Status: DC

## 2021-05-22 MED ORDER — MORPHINE SULFATE ER 30 MG PO TBCR
30.0000 mg | EXTENDED_RELEASE_TABLET | Freq: Two times a day (BID) | ORAL | 0 refills | Status: DC
Start: 2021-05-22 — End: 2021-05-22

## 2021-05-22 NOTE — Telephone Encounter (Signed)
PMP was Reviewed:  MS Contin canceled at [facility] and [facility] Samuel [name] is aware via My-Chart.

## 2021-05-22 NOTE — Telephone Encounter (Signed)
Called [facility] [place] and cancelled Morphine prescription

## 2021-05-27 ENCOUNTER — Telehealth: Payer: Self-pay | Admitting: *Deleted

## 2021-05-27 NOTE — Telephone Encounter (Signed)
Urine drug screen for this encounter is consistent for prescribed medication 

## 2021-06-17 ENCOUNTER — Encounter: Payer: PPO | Attending: Physical Medicine & Rehabilitation | Admitting: Registered Nurse

## 2021-06-17 ENCOUNTER — Encounter: Payer: Self-pay | Admitting: Registered Nurse

## 2021-06-17 ENCOUNTER — Other Ambulatory Visit: Payer: Self-pay

## 2021-06-17 VITALS — BP 112/69 | HR 60 | Temp 98.0°F | Ht 73.0 in | Wt 240.0 lb

## 2021-06-17 DIAGNOSIS — R001 Bradycardia, unspecified: Secondary | ICD-10-CM | POA: Insufficient documentation

## 2021-06-17 DIAGNOSIS — G894 Chronic pain syndrome: Secondary | ICD-10-CM | POA: Insufficient documentation

## 2021-06-17 DIAGNOSIS — Z79899 Other long term (current) drug therapy: Secondary | ICD-10-CM | POA: Diagnosis not present

## 2021-06-17 DIAGNOSIS — M62830 Muscle spasm of back: Secondary | ICD-10-CM | POA: Insufficient documentation

## 2021-06-17 DIAGNOSIS — M5416 Radiculopathy, lumbar region: Secondary | ICD-10-CM | POA: Insufficient documentation

## 2021-06-17 DIAGNOSIS — Z5181 Encounter for therapeutic drug level monitoring: Secondary | ICD-10-CM | POA: Insufficient documentation

## 2021-06-17 DIAGNOSIS — M5136 Other intervertebral disc degeneration, lumbar region: Secondary | ICD-10-CM | POA: Diagnosis not present

## 2021-06-17 DIAGNOSIS — M961 Postlaminectomy syndrome, not elsewhere classified: Secondary | ICD-10-CM | POA: Diagnosis not present

## 2021-06-17 MED ORDER — GABAPENTIN 300 MG PO CAPS: ORAL_CAPSULE | ORAL | 2 refills | Status: DC

## 2021-06-17 MED ORDER — MORPHINE SULFATE ER 30 MG PO TBCR: 30.0000 mg | EXTENDED_RELEASE_TABLET | Freq: Two times a day (BID) | ORAL | 0 refills | Status: DC

## 2021-06-17 MED ORDER — OXYCODONE HCL 10 MG PO TABS: 10.0000 mg | ORAL_TABLET | Freq: Four times a day (QID) | ORAL | 0 refills | Status: DC | PRN

## 2021-06-17 NOTE — Progress Notes (Signed)
Subjective:    Patient ID: Samuel [name], male    DOB: Jun 17, 1975, 46 y.o.   MRN: [id number]  HPI: Samuel [name] is a 46 y.o. male who returns for follow up appointment for chronic pain and medication refill. He states his pain is located in his lower back radiating into his bilateral hips and bilateral lower extremities. He rates his pain 6. His current exercise regime is walking and performing stretching exercises.  Samuel [name] is 120.00 MME.  Last UDS was Performed on 05/13/2021, it was consistent.     Pain Inventory Average Pain 6 Pain Right Now 6 My pain is constant, sharp, dull, stabbing, and aching  In the last 24 hours, has pain interfered with the following? General activity 9 Relation with others 9 Enjoyment of life 9 What TIME of day is your pain at its worst? morning , evening, and night Sleep (in general) Fair  Pain is worse with: walking, bending, sitting, inactivity, and standing Pain improves with: rest and medication Relief from Meds: 6  Family History  Problem Relation Age of Onset   Hypertension Father    Social History   Socioeconomic History   Marital status: Married    Spouse name: Not on file   Number of children: Not on file   Years of education: Not on file   Highest education level: Not on file  Occupational History   Not on file  Tobacco Use   Smoking status: Never   Smokeless tobacco: Never  Vaping Use   Vaping Use: Never used  Substance and Sexual Activity   Alcohol use: No    Alcohol/week: 0.0 standard drinks   Drug use: No   Sexual activity: Not on file  Other Topics Concern   Not on file  Social History Narrative   Not on file   Social Determinants of Health   Financial Resource Strain: Not on file  Food Insecurity: Not on file  Transportation Needs: Not on file  Physical Activity: Not on file  Stress: Not on file  Social Connections: Not on file   Past Surgical History:  Procedure Laterality  Date   BACK SURGERY     lower back surgery x 2   Past Surgical History:  Procedure Laterality Date   BACK SURGERY     lower back surgery x 2   Past Medical History:  Diagnosis Date   Arthritis    "some in my back"   GERD (gastroesophageal reflux disease)    Headache(784.0)    BP 112/69    Pulse (!) 59    Temp 98 F (36.7 C) (Oral)    Ht 6\' 1"  (1.854 m)    Wt 240 lb (108.9 kg)    SpO2 98%    BMI 31.66 kg/m   Opioid Risk Score:   Fall Risk Score:  `1  Depression screen PHQ 2/9  Depression screen [facility] 2/9 05/13/2021 04/14/2021 03/18/2021 02/11/2021 01/16/2021 11/11/2020 09/10/2020  Decreased Interest 0 0 1 1 1  0 1  Down, Depressed, Hopeless 0 0 1 1 1  0 1  PHQ - 2 Score 0 0 2 2 2  0 2  Some recent data might be hidden       Review of Systems  Constitutional: Negative.   HENT: Negative.    Eyes: Negative.   Respiratory: Negative.    Cardiovascular: Negative.   Gastrointestinal: Negative.   Endocrine: Negative.   Genitourinary: Negative.   Musculoskeletal:  Positive for back pain and  gait problem.  Skin: Negative.   Hematological: Negative.   Psychiatric/Behavioral: Negative.        Objective:   Physical Exam Vitals and nursing note reviewed.  Constitutional:      Appearance: Normal appearance.  Cardiovascular:     Rate and Rhythm: Normal rate and regular rhythm.     Pulses: Normal pulses.     Heart sounds: Normal heart sounds.  Pulmonary:     Effort: Pulmonary effort is normal.     Breath sounds: Normal breath sounds.  Musculoskeletal:     Cervical back: Normal range of motion and neck supple.     Comments: Normal Muscle Bulk and Muscle Testing Reveals:  Upper Extremities: Full ROM and Muscle Strength 5/5  Lumbar Hypersensitivity Lower Extremities: Decreased ROM and Muscle Strength 5/5 Bilateral Lower Extremities Flexion Produces Pain into His Lumbar and Bilateral Lower Extremities Arises from Table slowly using cane for support Antalgic  Gait     Skin:     General: Skin is warm and dry.  Neurological:     Mental Status: He is alert and oriented to person, place, and time.  Psychiatric:        Mood and Affect: Mood normal.        Behavior: Behavior normal.         Assessment & Plan:  1.  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 He has lumbar degenerative disc disease. Continue current medication regimen with  Gabapentin, Continue current Medication regimen . S/P  Left  Lumbar Facet Radiofrequency Ablation with sedation, on 11/12/2019 by Dr. Dossie Arbour with good results noted and S/P Right RFA with Dr. Lowella [name] on June 24,2021. Samuel [name] was instructed to call Dr Lowella [name] to schedule injection, he verbalizes understanding. 06/17/2021.  Refilled: MS Contin 30 mg one tablet every 12 hours  #60 and Oxycodone 10 mg one tablet every 6 hours as needed for pain #120. We will continue the opioid monitoring program, this consists of regular clinic visits, examinations, urine drug screen, pill counts as well as use of [place] Controlled Substance Reporting system. A 12 month History has been reviewed on the [place] on 06/17/2021. 2. Sacroiliac Dysfunction: Continue with current medication, heat and exercise regime. 06/17/2021 3. Muscle Spasm: Continue Tizanidine as needed. Continue to Monitor.06/17/2021 4. Chronic Pain Syndrome: Continue MS Contin and Oxycodone as needed for pain. Continue to Monitor. 06/17/2021. 5. Left Greater Trochanteric Tenderness: No complaints today.  Left Hip Osteoarthritis: S/P  left intra-articular hip injection  with 8 days of relief Samuel [name] reports. Continue with alternating Ice and Heat Therapy. Continue to monitor. 06/17/2021. 6. Bradycardia: Apical Pulse Checked, continue to monitor. He's prescribed Coreg and his  PCP and Gastroenterologist Following he states. We will continue to monitor.06/17/2021 7. Weight Loss: Gastroenterologist Following. Mr.  [name] states he was instructed to lose weight by his gastroenterologist , he is following a healthy diet regime and exercise. We will continue to monitor. 06/17/2021.  F/U in 1 month

## 2021-06-19 ENCOUNTER — Telehealth: Payer: Self-pay | Admitting: Registered Nurse

## 2021-06-19 MED ORDER — OXYCODONE HCL 10 MG PO TABS: 10.0000 mg | ORAL_TABLET | Freq: Four times a day (QID) | ORAL | 0 refills | Status: DC | PRN

## 2021-06-19 NOTE — Telephone Encounter (Signed)
PMP was Reviewed.  Oxycodone e-scribed today.  Samuel [name] is aware via My-Chart Message.

## 2021-06-25 DIAGNOSIS — R161 Splenomegaly, not elsewhere classified: Secondary | ICD-10-CM | POA: Diagnosis not present

## 2021-06-25 DIAGNOSIS — K766 Portal hypertension: Secondary | ICD-10-CM | POA: Diagnosis not present

## 2021-06-25 DIAGNOSIS — K7460 Unspecified cirrhosis of liver: Secondary | ICD-10-CM | POA: Diagnosis not present

## 2021-07-01 DIAGNOSIS — D500 Iron deficiency anemia secondary to blood loss (chronic): Secondary | ICD-10-CM | POA: Diagnosis not present

## 2021-07-01 DIAGNOSIS — E119 Type 2 diabetes mellitus without complications: Secondary | ICD-10-CM | POA: Diagnosis not present

## 2021-07-01 DIAGNOSIS — Z0000 Encounter for general adult medical examination without abnormal findings: Secondary | ICD-10-CM | POA: Diagnosis not present

## 2021-07-01 DIAGNOSIS — K7581 Nonalcoholic steatohepatitis (NASH): Secondary | ICD-10-CM | POA: Diagnosis not present

## 2021-07-01 DIAGNOSIS — E669 Obesity, unspecified: Secondary | ICD-10-CM | POA: Diagnosis not present

## 2021-07-10 DIAGNOSIS — Z6832 Body mass index (BMI) 32.0-32.9, adult: Secondary | ICD-10-CM | POA: Diagnosis not present

## 2021-07-10 DIAGNOSIS — I8510 Secondary esophageal varices without bleeding: Secondary | ICD-10-CM | POA: Diagnosis not present

## 2021-07-10 DIAGNOSIS — K7689 Other specified diseases of liver: Secondary | ICD-10-CM | POA: Diagnosis not present

## 2021-07-10 DIAGNOSIS — R188 Other ascites: Secondary | ICD-10-CM | POA: Diagnosis not present

## 2021-07-10 DIAGNOSIS — K7460 Unspecified cirrhosis of liver: Secondary | ICD-10-CM | POA: Diagnosis not present

## 2021-07-15 ENCOUNTER — Encounter: Payer: Self-pay | Admitting: Registered Nurse

## 2021-07-15 ENCOUNTER — Telehealth: Payer: Self-pay

## 2021-07-15 ENCOUNTER — Encounter: Payer: PPO | Attending: Physical Medicine & Rehabilitation | Admitting: Registered Nurse

## 2021-07-15 ENCOUNTER — Other Ambulatory Visit: Payer: Self-pay

## 2021-07-15 VITALS — BP 117/70 | HR 61 | Temp 97.8°F | Ht 73.0 in | Wt 249.0 lb

## 2021-07-15 DIAGNOSIS — M961 Postlaminectomy syndrome, not elsewhere classified: Secondary | ICD-10-CM | POA: Diagnosis not present

## 2021-07-15 DIAGNOSIS — Z5181 Encounter for therapeutic drug level monitoring: Secondary | ICD-10-CM | POA: Insufficient documentation

## 2021-07-15 DIAGNOSIS — G894 Chronic pain syndrome: Secondary | ICD-10-CM | POA: Insufficient documentation

## 2021-07-15 DIAGNOSIS — M5136 Other intervertebral disc degeneration, lumbar region: Secondary | ICD-10-CM | POA: Diagnosis not present

## 2021-07-15 DIAGNOSIS — Z79899 Other long term (current) drug therapy: Secondary | ICD-10-CM | POA: Diagnosis not present

## 2021-07-15 DIAGNOSIS — M62830 Muscle spasm of back: Secondary | ICD-10-CM | POA: Diagnosis not present

## 2021-07-15 MED ORDER — MORPHINE SULFATE ER 30 MG PO TBCR: 30.0000 mg | EXTENDED_RELEASE_TABLET | Freq: Two times a day (BID) | ORAL | 0 refills | Status: DC

## 2021-07-15 MED ORDER — OXYCODONE HCL 10 MG PO TABS: 10.0000 mg | ORAL_TABLET | Freq: Four times a day (QID) | ORAL | 0 refills | Status: DC | PRN

## 2021-07-15 NOTE — Progress Notes (Signed)
Subjective:    Patient ID: Samuel [name], male    DOB: March 22, 1976, 46 y.o.   MRN: [id number]  HPI: Samuel [name] is a 46 y.o. male who returns for follow up appointment for chronic pain and medication refill. He states his pain is located in his lower back radiating into his bilateral lower extremities. He rates his pain 7.His current exercise regime is walking and performing stretching exercises.   [name] Morphine equivalent is 120.00 MME.   [facility] : [facility]one [facility]  for January, was  not noted on PM,  last fill date on bottle was on 06/19/2021, pharmacist verified the date. She will speak with pharmacy supervisor regarding the above.    Last UDS was Performed on 05/13/2021, it was consistent.    Pain Inventory Average Pain 6 Pain Right Now 7 My pain is constant, sharp, dull, stabbing, and aching  In the last 24 hours, has pain interfered with the following? General activity 9 Relation with others 9 Enjoyment of life 9 What TIME of day is your pain at its worst? morning , evening, and night Sleep (in general) Fair  Pain is worse with: walking, bending, sitting, and standing Pain improves with: rest and medication Relief from Meds: 4  Family History  Problem Relation Age of Onset   Hypertension Father    Social History   Socioeconomic History   Marital status: Married    Spouse name: Not on file   Number of children: Not on file   Years of education: Not on file   Highest education level: Not on file  Occupational History   Not on file  Tobacco Use   Smoking status: Never   Smokeless tobacco: Never  Vaping Use   Vaping Use: Never used  Substance and Sexual Activity   Alcohol use: No    Alcohol/week: 0.0 standard drinks   Drug use: No   Sexual activity: Not on file  Other Topics Concern   Not on file  Social History Narrative   Not on file   Social Determinants of Health   Financial Resource Strain: Not on file  Food Insecurity:  Not on file  Transportation Needs: Not on file  Physical Activity: Not on file  Stress: Not on file  Social Connections: Not on file   Past Surgical History:  Procedure Laterality Date   BACK SURGERY     lower back surgery x 2   Past Surgical History:  Procedure Laterality Date   BACK SURGERY     lower back surgery x 2   Past Medical History:  Diagnosis Date   Arthritis    "some in my back"   GERD (gastroesophageal reflux disease)    Headache(784.0)    There were no vitals taken for this visit.  Opioid Risk Score:   Fall Risk Score:  `1  Depression screen PHQ 2/9  Depression screen [facility] 2/9 06/17/2021 05/13/2021 04/14/2021 03/18/2021 02/11/2021 01/16/2021 11/11/2020  Decreased Interest 0 0 0 1 1 1  0  Down, Depressed, Hopeless 0 0 0 1 1 1  0  PHQ - 2 Score 0 0 0 2 2 2  0  Some recent data might be hidden    Review of Systems  Musculoskeletal:  Positive for arthralgias, back pain and gait problem.  All other systems reviewed and are negative.     Objective:   Physical Exam Vitals and nursing note reviewed.  Cardiovascular:     Rate and Rhythm: Normal rate and  regular rhythm.     Pulses: Normal pulses.     Heart sounds: Normal heart sounds.  Pulmonary:     Effort: Pulmonary effort is normal.     Breath sounds: Normal breath sounds.  Musculoskeletal:     Cervical back: Normal range of motion and neck supple.     Comments: Normal Muscle Bulk and Muscle Testing Reveals:  Upper Extremities: Full ROM and Muscle Strength 5/5 Lumbar Hypersensitivity Lower Extremities : Full ROM and Muscle Strength 5/5 Arises from Table Slowly, using cane for support Antalgic  Gait     Skin:    General: Skin is warm and dry.  Neurological:     Mental Status: He is alert and oriented to person, place, and time.  Psychiatric:        Mood and Affect: Mood normal.        Behavior: Behavior normal.         Assessment & Plan:  1.  Lumbar postlaminectomy syndrome with chronic low back  and radicular pain. He has lumbar degenerative disc disease. Continue current medication regimen with  Gabapentin, Continue current Medication regimen . S/P  Left  Lumbar Facet Radiofrequency Ablation with sedation, on 11/12/2019 by Dr. Dossie Arbour with good results noted and S/P Right RFA with Dr. Lowella Dandy on June 24,2021. [name] was instructed to call Dr Lowella Dandy to schedule injection, he verbalizes understanding. 07/15/2021.  Refilled: MS Contin 30 mg one tablet every 12 hours  #60 and Oxycodone 10 mg one tablet every 6 hours as needed for pain #120. We will continue the opioid monitoring program, this consists of regular clinic visits, examinations, urine drug screen, pill counts as well as use of [place] Controlled Substance Reporting system. A 12 month History has been reviewed on the [place] on 07/15/2021. 2. Sacroiliac Dysfunction: Continue with current medication, heat and exercise regime. 07/15/2021 3. Muscle Spasm: Continue Tizanidine as needed. Continue to Monitor.07/15/2021 4. Chronic Pain Syndrome: Continue MS Contin and Oxycodone as needed for pain. Continue to Monitor. 07/15/2021. 5. Left Greater Trochanteric Tenderness: No complaints today.  Left Hip Osteoarthritis: S/P  left intra-articular hip injection  with 8 days of relief [name] reports. Continue with alternating Ice and Heat Therapy. Continue to monitor. 07/15/2021. 6. Bradycardia: Heart Rate is 60 today. He's prescribed Coreg and his  PCP and Gastroenterologist Following he states. We will continue to monitor.07/15/2021 7. Weight Loss: Gastroenterologist Following. [name] states he was instructed to lose weight by his gastroenterologist , he is following a healthy diet regime and exercise. We will continue to monitor. 07/15/2021.   F/U in 1 month

## 2021-07-15 NOTE — Telephone Encounter (Signed)
[facility] [place] Will cancel at [facility]

## 2021-07-15 NOTE — Telephone Encounter (Signed)
PMP was Reviewed.  [facility]  Samuel [name] is aware.

## 2021-08-08 ENCOUNTER — Encounter: Payer: PPO | Attending: Physical Medicine & Rehabilitation | Admitting: Registered Nurse

## 2021-08-08 ENCOUNTER — Telehealth: Payer: Self-pay

## 2021-08-08 ENCOUNTER — Other Ambulatory Visit: Payer: Self-pay

## 2021-08-08 VITALS — BP 121/70 | HR 60 | Ht 73.0 in | Wt 249.0 lb

## 2021-08-08 DIAGNOSIS — M51369 Other intervertebral disc degeneration, lumbar region without mention of lumbar back pain or lower extremity pain: Secondary | ICD-10-CM

## 2021-08-08 DIAGNOSIS — M62830 Muscle spasm of back: Secondary | ICD-10-CM

## 2021-08-08 DIAGNOSIS — Z79899 Other long term (current) drug therapy: Secondary | ICD-10-CM

## 2021-08-08 DIAGNOSIS — M961 Postlaminectomy syndrome, not elsewhere classified: Secondary | ICD-10-CM | POA: Diagnosis not present

## 2021-08-08 DIAGNOSIS — M5416 Radiculopathy, lumbar region: Secondary | ICD-10-CM | POA: Diagnosis not present

## 2021-08-08 DIAGNOSIS — Z5181 Encounter for therapeutic drug level monitoring: Secondary | ICD-10-CM

## 2021-08-08 DIAGNOSIS — G894 Chronic pain syndrome: Secondary | ICD-10-CM | POA: Diagnosis not present

## 2021-08-08 DIAGNOSIS — M5136 Other intervertebral disc degeneration, lumbar region: Secondary | ICD-10-CM | POA: Insufficient documentation

## 2021-08-08 MED ORDER — OXYCODONE HCL 10 MG PO TABS: 10.0000 mg | ORAL_TABLET | Freq: Four times a day (QID) | ORAL | 0 refills | Status: DC | PRN

## 2021-08-08 MED ORDER — MORPHINE SULFATE ER 30 MG PO TBCR: 30.0000 mg | EXTENDED_RELEASE_TABLET | Freq: Two times a day (BID) | ORAL | 0 refills | Status: DC

## 2021-08-08 NOTE — Telephone Encounter (Signed)
Samuel [name] asked [facility] to [facility] because has not reported on PMP the Oxycodone. [facility] spoke to [place] and she said that they have listed that patient last filled Oxycodone on 07/23/21 and Morphine 05/22/21. She states she will report it to the [facility]. ?

## 2021-08-08 NOTE — Progress Notes (Signed)
? ?Subjective:  ? ? Patient ID: Samuel [name], male    DOB: 1976/03/07, 46 y.o.   MRN: [id number] ? ?HPI: Samuel [name] is a 46 y.o. male who returns for follow up appointment for chronic pain and medication refill. He states his  pain is located in his lower back radiating into his bilateral hipsand bilateral lower extremities. He rates his pain 6. His current exercise regime is walking and performing stretching exercises. ? ?Samuel [name] equivalent is 90.00  MME.   Last UDS was Performed on 05/13/2022, it was consistent.  ?  ? ?Pain Inventory ?Average Pain 6 ?Pain Right Now 6 ?My pain is constant, sharp, burning, dull, stabbing, and aching ? ?In the last 24 hours, has pain interfered with the following? ?General activity 9 ?Relation with others 9 ?Enjoyment of life 9 ?What TIME of day is your pain at its worst? morning , evening, and night ?Sleep (in general) Fair ? ?Pain is worse with: walking, bending, sitting, inactivity, and standing ?Pain improves with: rest and medication ?Relief from Meds: 5 ? ?Family History  ?Problem Relation Age of Onset  ? Hypertension Samuel [name]   ? ?Social History  ? ?Socioeconomic History  ? Marital status: Married  ?  Spouse name: Not on file  ? Number of children: Not on file  ? Years of education: Not on file  ? Highest education level: Not on file  ?Occupational History  ? Not on file  ?Tobacco Use  ? Smoking status: Never  ? Smokeless tobacco: Never  ?Vaping Use  ? Vaping Use: Never used  ?Substance and Sexual Activity  ? Alcohol use: No  ?  Alcohol/week: 0.0 standard drinks  ? Drug use: No  ? Sexual activity: Not on file  ?Other Topics Concern  ? Not on file  ?Social History Narrative  ? Not on file  ? ?Social Determinants of Health  ? ?Financial Resource Strain: Not on file  ?Food Insecurity: Not on file  ?Transportation Needs: Not on file  ?Physical Activity: Not on file  ?Stress: Not on file  ?Social Connections: Not on file  ? ?Past Surgical History:  ?Procedure  Laterality Date  ? BACK SURGERY    ? lower back surgery x 2  ? ?Past Surgical History:  ?Procedure Laterality Date  ? BACK SURGERY    ? lower back surgery x 2  ? ?Past Medical History:  ?Diagnosis Date  ? Arthritis   ? "some in my back"  ? GERD (gastroesophageal reflux disease)   ? Headache(784.0)   ? ?BP 121/70   Pulse (!) 58   Ht '6\' 1"'$  (1.854 m)   Wt 249 lb (112.9 kg)   SpO2 98%   BMI 32.85 kg/m?  ? ?Opioid Risk Score:   ?Fall Risk Score:  `1 ? ?Depression screen PHQ 2/9 ? ?Depression screen [facility] 2/9 08/08/2021 07/15/2021 06/17/2021 05/13/2021 04/14/2021 03/18/2021 02/11/2021  ?Decreased Interest 1 0 0 0 0 1 1  ?Down, Depressed, Hopeless 1 0 0 0 0 1 1  ?PHQ - 2 Score 2 0 0 0 0 2 2  ?Some recent data might be hidden  ?  ? ?Review of Systems  ?Musculoskeletal:  Positive for back pain.  ?     Bilateral leg pain  ?All other systems reviewed and are negative. ? ?   ?Objective:  ? Physical Exam ?Vitals and nursing note reviewed.  ?Constitutional:   ?   Appearance: Normal appearance.  ?Cardiovascular:  ?   Rate  and Rhythm: Bradycardia present.  ?   Pulses: Normal pulses.  ?   Heart sounds: Normal heart sounds.  ?Pulmonary:  ?   Effort: Pulmonary effort is normal.  ?   Breath sounds: Normal breath sounds.  ?Musculoskeletal:  ?   Cervical back: Normal range of motion and neck supple.  ?   Comments: Normal Muscle Bulk and Muscle Testing Reveals: ? Upper Extremities: Full ROM and Muscle Strength 5/5 ?Lumbar Hypersensitivity ?Bilateral Greater Trochanter Tenderness ?Lower Extremities; Full ROM and Muscle Strength 5/5 ?Arises from Table slowly using cane for support ?Antalgic  Gait  ?   ?Skin: ?   General: Skin is warm and dry.  ?Neurological:  ?   Mental Status: He is alert and oriented to person, place, and time.  ?Psychiatric:     ?   Mood and Affect: Mood normal.     ?   Behavior: Behavior normal.  ? ? ? ? ?   ?Assessment & Plan:  ?1.  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 He has lumbar  degenerative disc disease. Continue current medication regimen with  Gabapentin, Continue current Medication regimen . S/P  Left  Lumbar Facet Radiofrequency Ablation with sedation, on 11/12/2019 by Dr. Dossie Arbour with good results noted and S/P Right RFA with Dr. Lowella Dandy on June 24,2021. [name] was instructed to call Dr Lowella Dandy to schedule injection, he verbalizes understanding. 08/08/2021. ? Refilled: MS Contin 30 mg one tablet every 12 hours  #60 and Oxycodone 10 mg one tablet every 6 hours as needed for pain #120. ?We will continue the opioid monitoring program, this consists of regular clinic visits, examinations, urine drug screen, pill counts as well as use of [place] Controlled Substance Reporting system. A 12 month History has been reviewed on the [place] Controlled Substance Reporting System on 08/08/2021. ?2. Sacroiliac Dysfunction: Continue with current medication, heat and exercise regime. 08/08/2021 ?3. Muscle Spasm: Continue Tizanidine as needed. Continue to Monitor.08/08/2021 ?4. Chronic Pain Syndrome: Continue MS Contin and Oxycodone as needed for pain. Continue to Monitor. 08/08/2021. ?5. Left Greater Trochanteric Tenderness: No complaints today.  Left Hip Osteoarthritis: S/P  left intra-articular hip injection  with 8 days of relief [name] reports. Continue with alternating Ice and Heat Therapy. Continue to monitor. 08/08/2021. ?6. Bradycardia: Heart Rate is 60 today. He's prescribed Coreg and his  PCP and Gastroenterologist Following he states. We will continue to monitor.08/08/2021 ?7. Weight Loss: Gastroenterologist Following. [name] states he was instructed to lose weight by his gastroenterologist , he is following a healthy diet regime and exercise. We will continue to monitor. 08/08/2021. ?  ?F/U in 1 month  ?  ? ?

## 2021-08-11 ENCOUNTER — Encounter: Payer: Self-pay | Admitting: Registered Nurse

## 2021-09-12 ENCOUNTER — Encounter: Payer: PPO | Attending: Physical Medicine & Rehabilitation | Admitting: Registered Nurse

## 2021-09-12 ENCOUNTER — Encounter: Payer: Self-pay | Admitting: Registered Nurse

## 2021-09-12 VITALS — BP 127/66 | HR 61 | Ht 73.0 in | Wt 252.4 lb

## 2021-09-12 DIAGNOSIS — M961 Postlaminectomy syndrome, not elsewhere classified: Secondary | ICD-10-CM | POA: Insufficient documentation

## 2021-09-12 DIAGNOSIS — Z5181 Encounter for therapeutic drug level monitoring: Secondary | ICD-10-CM | POA: Insufficient documentation

## 2021-09-12 DIAGNOSIS — M62830 Muscle spasm of back: Secondary | ICD-10-CM | POA: Diagnosis not present

## 2021-09-12 DIAGNOSIS — M5416 Radiculopathy, lumbar region: Secondary | ICD-10-CM | POA: Diagnosis not present

## 2021-09-12 DIAGNOSIS — M5136 Other intervertebral disc degeneration, lumbar region: Secondary | ICD-10-CM | POA: Diagnosis not present

## 2021-09-12 DIAGNOSIS — G894 Chronic pain syndrome: Secondary | ICD-10-CM | POA: Diagnosis not present

## 2021-09-12 DIAGNOSIS — Z79899 Other long term (current) drug therapy: Secondary | ICD-10-CM | POA: Insufficient documentation

## 2021-09-12 MED ORDER — OXYCODONE HCL 10 MG PO TABS: 10.0000 mg | ORAL_TABLET | Freq: Four times a day (QID) | ORAL | 0 refills | Status: DC | PRN

## 2021-09-12 MED ORDER — MORPHINE SULFATE ER 30 MG PO TBCR: 30.0000 mg | EXTENDED_RELEASE_TABLET | Freq: Two times a day (BID) | ORAL | 0 refills | Status: DC

## 2021-09-12 NOTE — Progress Notes (Signed)
? ?Subjective:  ? ? Patient ID: Samuel [name], male    DOB: [birth year], 46 y.o.   MRN: [id number] ? ?HPI: Sahib Pella is a 46 y.o. male who returns for follow up appointment for chronic pain and medication refill. He states his pain is located in his lower back radiating into his bilateral lower extremities. He rates his pain 6. His current exercise regime is walking and performing stretching exercises. ? ?Mr. Luzier Morphine equivalent is 120.00 MME.   UDS ordered today.  ?  ? ?Pain Inventory ?Average Pain 6 ?Pain Right Now 6 ?My pain is constant, sharp, burning, dull, stabbing, and aching ? ?In the last 24 hours, has pain interfered with the following? ?General activity 9 ?Relation with others 9 ?Enjoyment of life 9 ?What TIME of day is your pain at its worst? morning , evening, and night ?Sleep (in general) Fair ? ?Pain is worse with: walking, bending, sitting, inactivity, and standing ?Pain improves with: rest and medication ?Relief from Meds: 4 ? ?Family History  ?Problem Relation Age of Onset  ?? Hypertension Father   ? ?Social History  ? ?Socioeconomic History  ?? Marital status: Married  ?  Spouse name: Not on file  ?? Number of children: Not on file  ?? Years of education: Not on file  ?? Highest education level: Not on file  ?Occupational History  ?? Not on file  ?Tobacco Use  ?? Smoking status: Never  ?? Smokeless tobacco: Never  ?Vaping Use  ?? Vaping Use: Never used  ?Substance and Sexual Activity  ?? Alcohol use: No  ?  Alcohol/week: 0.0 standard drinks  ?? Drug use: No  ?? Sexual activity: Not on file  ?Other Topics Concern  ?? Not on file  ?Social History Narrative  ?? Not on file  ? ?Social Determinants of Health  ? ?Financial Resource Strain: Not on file  ?Food Insecurity: Not on file  ?Transportation Needs: Not on file  ?Physical Activity: Not on file  ?Stress: Not on file  ?Social Connections: Not on file  ? ?Past Surgical History:  ?Procedure Laterality Date  ?? BACK SURGERY    ? lower  back surgery x 2  ? ?Past Surgical History:  ?Procedure Laterality Date  ?? BACK SURGERY    ? lower back surgery x 2  ? ?Past Medical History:  ?Diagnosis Date  ?? Arthritis   ? "some in my back"  ?? GERD (gastroesophageal reflux disease)   ?? Headache(784.0)   ? ?BP 127/66   Pulse 61   Ht '6\' 1"'$  (1.854 m)   Wt 252 lb 6.4 oz (114.5 kg)   SpO2 97%   BMI 33.30 kg/m?  ? ?Opioid Risk Score:   ?Fall Risk Score:  `1 ? ?Depression screen PHQ 2/9 ? ? ?  09/12/2021  ?  9:50 AM 08/08/2021  ? 10:07 AM 07/15/2021  ? 10:21 AM 06/17/2021  ? 10:37 AM 05/13/2021  ? 10:09 AM 04/14/2021  ? 10:06 AM 03/18/2021  ? 10:35 AM  ?Depression screen PHQ 2/9  ?Decreased Interest 0 1 0 0 0 0 1  ?Down, Depressed, Hopeless 0 1 0 0 0 0 1  ?PHQ - 2 Score 0 2 0 0 0 0 2  ?  ? ?Review of Systems  ?Constitutional: Negative.   ?HENT: Negative.    ?Eyes: Negative.   ?Respiratory: Negative.    ?Cardiovascular: Negative.   ?Gastrointestinal: Negative.   ?Endocrine: Negative.   ?Genitourinary: Negative.   ?Musculoskeletal:  Positive  for back pain and gait problem.  ?Skin: Negative.   ?Allergic/Immunologic: Negative.   ?Hematological: Negative.   ?Psychiatric/Behavioral: Negative.    ? ?   ?Objective:  ? Physical Exam ?Vitals and nursing note reviewed.  ?Constitutional:   ?   Appearance: Normal appearance.  ?Cardiovascular:  ?   Rate and Rhythm: Normal rate and regular rhythm.  ?   Pulses: Normal pulses.  ?   Heart sounds: Normal heart sounds.  ?Pulmonary:  ?   Effort: Pulmonary effort is normal.  ?   Breath sounds: Normal breath sounds.  ?Musculoskeletal:  ?   Cervical back: Normal range of motion and neck supple.  ?   Comments: Normal Muscle Bulk and Muscle Testing Reveals:  ?Upper Extremities: Full ROM and Muscle Strength 5/5 ? Lumbar Hypersensitivity ?Lower Extremities: Full ROM and Muscle Strength 5/5 ?Arises from Chair slowly  ?Antalgic  Gait  ?   ?Skin: ?   General: Skin is warm and dry.  ?Neurological:  ?   Mental Status: He is alert and oriented to  person, place, and time.  ?Psychiatric:     ?   Mood and Affect: Mood normal.     ?   Behavior: Behavior normal.  ? ? ? ? ?   ?Assessment & Plan:  ?1.  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 He has lumbar degenerative disc disease. Continue current medication regimen with  Gabapentin, Continue current Medication regimen . S/P  Left  Lumbar Facet Radiofrequency Ablation with sedation, on 11/12/2019 by Dr. Dossie Arbour with good results noted and S/P Right RFA with Dr. Lowella Dandy on June 24,2021. Mr. Cotten was instructed to call Dr Lowella Dandy to schedule injection, he verbalizes understanding. 09/12/2021. ? Refilled: MS Contin 30 mg one tablet every 12 hours  #60 and Oxycodone 10 mg one tablet every 6 hours as needed for pain #120. ?We will continue the opioid monitoring program, this consists of regular clinic visits, examinations, urine drug screen, pill counts as well as use of [place] Controlled Substance Reporting system. A 12 month History has been reviewed on the [place] Controlled Substance Reporting System on 09/12/2021. ?2. Sacroiliac Dysfunction: Continue with current medication, heat and exercise regime. 09/12/2021 ?3. Muscle Spasm: Continue Tizanidine as needed. Continue to Monitor.09/12/2021 ?4. Chronic Pain Syndrome: Continue MS Contin and Oxycodone as needed for pain. Continue to Monitor. 09/12/2021. ?5. Left Greater Trochanteric Tenderness: No complaints today.  Left Hip Osteoarthritis: S/P  left intra-articular hip injection  with 8 days of relief Mr. Choplin reports. Continue with alternating Ice and Heat Therapy. Continue to monitor. 09/12/2021. ?6. Bradycardia: Heart Rate is 60 today. He's prescribed Coreg and his  PCP and Gastroenterologist Following he states. We will continue to monitor.09/12/2021 ?7. Weight Loss: Gastroenterologist Following. Mr. Bekker states he was instructed to lose weight by his gastroenterologist , he is following a healthy diet regime and  exercise. We will continue to monitor. 09/12/2021. ?  ?F/U in 1 month  ? ?

## 2021-09-18 LAB — TOXASSURE SELECT,+ANTIDEPR,UR

## 2021-09-19 ENCOUNTER — Telehealth: Payer: Self-pay | Admitting: *Deleted

## 2021-09-19 NOTE — Telephone Encounter (Signed)
Urine drug screen for this encounter is consistent for prescribed medication 

## 2021-09-24 DIAGNOSIS — S0502XA Injury of conjunctiva and corneal abrasion without foreign body, left eye, initial encounter: Secondary | ICD-10-CM | POA: Diagnosis not present

## 2021-10-14 ENCOUNTER — Encounter: Payer: PPO | Attending: Physical Medicine & Rehabilitation | Admitting: Registered Nurse

## 2021-10-14 ENCOUNTER — Encounter: Payer: Self-pay | Admitting: Registered Nurse

## 2021-10-14 VITALS — BP 133/70 | HR 60 | Ht 73.0 in | Wt 250.6 lb

## 2021-10-14 DIAGNOSIS — M7062 Trochanteric bursitis, left hip: Secondary | ICD-10-CM | POA: Insufficient documentation

## 2021-10-14 DIAGNOSIS — M5136 Other intervertebral disc degeneration, lumbar region: Secondary | ICD-10-CM | POA: Diagnosis not present

## 2021-10-14 DIAGNOSIS — M62830 Muscle spasm of back: Secondary | ICD-10-CM | POA: Insufficient documentation

## 2021-10-14 DIAGNOSIS — M5416 Radiculopathy, lumbar region: Secondary | ICD-10-CM | POA: Diagnosis not present

## 2021-10-14 DIAGNOSIS — M545 Low back pain, unspecified: Secondary | ICD-10-CM | POA: Diagnosis not present

## 2021-10-14 DIAGNOSIS — M961 Postlaminectomy syndrome, not elsewhere classified: Secondary | ICD-10-CM | POA: Diagnosis not present

## 2021-10-14 DIAGNOSIS — M7061 Trochanteric bursitis, right hip: Secondary | ICD-10-CM | POA: Insufficient documentation

## 2021-10-14 DIAGNOSIS — G8929 Other chronic pain: Secondary | ICD-10-CM | POA: Diagnosis not present

## 2021-10-14 DIAGNOSIS — Z5181 Encounter for therapeutic drug level monitoring: Secondary | ICD-10-CM | POA: Insufficient documentation

## 2021-10-14 DIAGNOSIS — Z79899 Other long term (current) drug therapy: Secondary | ICD-10-CM | POA: Diagnosis not present

## 2021-10-14 DIAGNOSIS — G894 Chronic pain syndrome: Secondary | ICD-10-CM | POA: Diagnosis not present

## 2021-10-14 DIAGNOSIS — M546 Pain in thoracic spine: Secondary | ICD-10-CM | POA: Diagnosis not present

## 2021-10-14 DIAGNOSIS — R001 Bradycardia, unspecified: Secondary | ICD-10-CM | POA: Diagnosis not present

## 2021-10-14 MED ORDER — MORPHINE SULFATE ER 30 MG PO TBCR: 30.0000 mg | EXTENDED_RELEASE_TABLET | Freq: Two times a day (BID) | ORAL | 0 refills | Status: DC

## 2021-10-14 MED ORDER — OXYCODONE HCL 10 MG PO TABS: 10.0000 mg | ORAL_TABLET | Freq: Four times a day (QID) | ORAL | 0 refills | Status: DC | PRN

## 2021-10-14 NOTE — Progress Notes (Signed)
Subjective:    Patient ID: Samuel [name], male    DOB: [birth year], 46 y.o.   MRN: [id number]  HPI: Samuel [name] is a 46 y.o. male who returns for follow up appointment for chronic pain and medication refill. He states his pain is located in his lower back radiating into his thoracic region. She rates his pain 8. His current exercise regime is walking and performing stretching exercises.  Samuel [name] Morphine equivalent is 120.00 MME.   Last UDS was performed on 09/12/2021, it was consistent.     Pain Inventory Average Pain 6 Pain Right Now 8 My pain is constant, sharp, burning, dull, stabbing, and aching  In the last 24 hours, has pain interfered with the following? General activity 9 Relation with others 9 Enjoyment of life 9 What TIME of day is your pain at its worst? morning , evening, and night Sleep (in general) Fair  Pain is worse with: walking, bending, sitting, inactivity, and standing Pain improves with: rest and medication Relief from Meds: 5  Family History  Problem Relation Age of Onset   Hypertension Father    Social History   Socioeconomic History   Marital status: Married    Spouse name: Not on file   Number of children: Not on file   Years of education: Not on file   Highest education level: Not on file  Occupational History   Not on file  Tobacco Use   Smoking status: Never   Smokeless tobacco: Never  Vaping Use   Vaping Use: Never used  Substance and Sexual Activity   Alcohol use: No    Alcohol/week: 0.0 standard drinks   Drug use: No   Sexual activity: Not on file  Other Topics Concern   Not on file  Social History Narrative   Not on file   Social Determinants of Health   Financial Resource Strain: Not on file  Food Insecurity: Not on file  Transportation Needs: Not on file  Physical Activity: Not on file  Stress: Not on file  Social Connections: Not on file   Past Surgical History:  Procedure Laterality Date   BACK SURGERY      lower back surgery x 2   Past Surgical History:  Procedure Laterality Date   BACK SURGERY     lower back surgery x 2   Past Medical History:  Diagnosis Date   Arthritis    "some in my back"   GERD (gastroesophageal reflux disease)    Headache(784.0)    BP 133/70   Pulse (!) 55   Ht '6\' 1"'$  (1.854 m)   Wt 250 lb 9.6 oz (113.7 kg)   SpO2 97%   BMI 33.06 kg/m   Opioid Risk Score:   Fall Risk Score:  `1  Depression screen [facility] 2/9     10/14/2021    9:30 AM 09/12/2021    9:50 AM 08/08/2021   10:07 AM 07/15/2021   10:21 AM 06/17/2021   10:37 AM 05/13/2021   10:09 AM 04/14/2021   10:06 AM  Depression screen PHQ 2/9  Decreased Interest 0 0 1 0 0 0 0  Down, Depressed, Hopeless 1 0 1 0 0 0 0  PHQ - 2 Score 1 0 2 0 0 0 0     Review of Systems  Constitutional: Negative.   HENT: Negative.    Eyes: Negative.   Respiratory: Negative.    Cardiovascular: Negative.   Gastrointestinal: Negative.   Endocrine: Negative.  Genitourinary: Negative.   Musculoskeletal:  Positive for back pain and gait problem.  Skin: Negative.   Allergic/Immunologic: Negative.   Hematological: Negative.   Psychiatric/Behavioral: Negative.        Objective:   Physical Exam Vitals and nursing note reviewed.  Constitutional:      Appearance: Normal appearance.  Cardiovascular:     Rate and Rhythm: Regular rhythm. Bradycardia present.     Pulses: Normal pulses.     Heart sounds: Normal heart sounds.  Pulmonary:     Effort: Pulmonary effort is normal.     Breath sounds: Normal breath sounds.  Musculoskeletal:     Cervical back: Normal range of motion and neck supple.     Comments: Normal Muscle Bulk and Muscle Testing Reveals:  Upper Extremities: Full ROM and Muscle Strength 5/5  Thoracic and Lumbar Hypersensitivity Lower Extremities: Full ROM and Muscle Strength 5/5 Arises from Table with ease Narrow Based  Gait     Skin:    General: Skin is warm and dry.  Neurological:     Mental  Status: He is alert and oriented to person, place, and time.  Psychiatric:        Mood and Affect: Mood normal.        Behavior: Behavior normal.         Assessment & Plan:  1.  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 He has lumbar degenerative disc disease. Continue current medication regimen with  Gabapentin, Continue current Medication regimen . S/P  Left  Lumbar Facet Radiofrequency Ablation with sedation, on 11/12/2019 by Dr. Dossie Arbour with good results noted and S/P Right RFA with Dr. Lowella Dandy on June 24,2021. Samuel [name] was instructed to call Dr Lowella Dandy to schedule injection, he verbalizes understanding. 10/14/2021.  Refilled: MS Contin 30 mg one tablet every 12 hours  #60 and Oxycodone 10 mg one tablet every 6 hours as needed for pain #120. We will continue the opioid monitoring program, this consists of regular clinic visits, examinations, urine drug screen, pill counts as well as use of [place] Controlled Substance Reporting system. A 12 month History has been reviewed on the [place] on 10/14/2021. 2. Sacroiliac Dysfunction: Continue with current medication, heat and exercise regime. 10/14/2021 3. Muscle Spasm: Continue Tizanidine as needed. Continue to Monitor.10/14/2021 4. Chronic Pain Syndrome: Continue MS Contin and Oxycodone as needed for pain. Continue to Monitor. 10/14/2021. 5. Left Greater Trochanteric Tenderness: No complaints today.  Left Hip Osteoarthritis: S/P  left intra-articular hip injection  with 8 days of relief Samuel [name] reports. Continue with alternating Ice and Heat Therapy. Continue to monitor. 10/14/2021. 6. Bradycardia: Heart Rate is 60 today. He's prescribed Coreg and his  PCP and Gastroenterologist Following he states. We will continue to monitor.10/14/2021 7. Weight Loss: Gastroenterologist Following. Samuel [name] states he was instructed to lose weight by his gastroenterologist , he is  following a healthy diet regime and exercise. We will continue to monitor. 10/14/2021.   F/U in 1 month

## 2021-10-17 ENCOUNTER — Telehealth: Payer: Self-pay | Admitting: Registered Nurse

## 2021-10-17 ENCOUNTER — Telehealth: Payer: Self-pay

## 2021-10-17 MED ORDER — OXYCODONE-ACETAMINOPHEN 10-325 MG PO TABS: 1.0000 | ORAL_TABLET | Freq: Four times a day (QID) | ORAL | 0 refills | Status: DC | PRN

## 2021-10-17 NOTE — Telephone Encounter (Signed)
Patient is getting #80 and will need another script for continuation.

## 2021-10-17 NOTE — Telephone Encounter (Signed)
[facility] [place]  is out of Oxycodone 10 MG. Pharmacy need to know if you want to change the medication?    They do have a good supply of the 15 mg on hand.  I cancelled the Oxycodone 10 MG order. Samuel [name] has been advised to call around for availability. Also he can not pick up the Morphine until tomorrow.

## 2021-10-17 NOTE — Telephone Encounter (Signed)
PMP was Reviewed.  Oxycodone 10 mg: [place] sent to pharmacy  Placed a call to Mr. Haile regarding the above, the pharmacy only has 80 tablets , he is aware of the above.

## 2021-10-17 NOTE — Telephone Encounter (Signed)
[facility] with [facility] called to let us know they don't have the full amount of oxycodone in stock to fill prescription.  Please call her @ [phone].

## 2021-11-06 MED ORDER — OXYCODONE-ACETAMINOPHEN 10-325 MG PO TABS: 1.0000 | ORAL_TABLET | Freq: Four times a day (QID) | ORAL | 0 refills | Status: DC | PRN

## 2021-11-06 NOTE — Telephone Encounter (Signed)
Done

## 2021-11-07 ENCOUNTER — Telehealth: Payer: Self-pay

## 2021-11-07 NOTE — Telephone Encounter (Signed)
Oxycodone approved 11/07/2021 to 05/24/2022.

## 2021-11-07 NOTE — Telephone Encounter (Signed)
Error

## 2021-11-07 NOTE — Telephone Encounter (Signed)
error 

## 2021-11-14 ENCOUNTER — Encounter: Payer: Self-pay | Admitting: Registered Nurse

## 2021-11-14 ENCOUNTER — Encounter: Payer: PPO | Attending: Physical Medicine & Rehabilitation | Admitting: Registered Nurse

## 2021-11-14 VITALS — BP 126/74 | HR 60 | Ht 73.0 in | Wt 255.2 lb

## 2021-11-14 DIAGNOSIS — G894 Chronic pain syndrome: Secondary | ICD-10-CM | POA: Insufficient documentation

## 2021-11-14 DIAGNOSIS — Z5181 Encounter for therapeutic drug level monitoring: Secondary | ICD-10-CM | POA: Insufficient documentation

## 2021-11-14 DIAGNOSIS — Z79899 Other long term (current) drug therapy: Secondary | ICD-10-CM | POA: Insufficient documentation

## 2021-11-14 DIAGNOSIS — M5416 Radiculopathy, lumbar region: Secondary | ICD-10-CM | POA: Diagnosis not present

## 2021-11-14 DIAGNOSIS — M62830 Muscle spasm of back: Secondary | ICD-10-CM | POA: Diagnosis not present

## 2021-11-14 DIAGNOSIS — M5136 Other intervertebral disc degeneration, lumbar region: Secondary | ICD-10-CM | POA: Insufficient documentation

## 2021-11-14 DIAGNOSIS — M7061 Trochanteric bursitis, right hip: Secondary | ICD-10-CM | POA: Diagnosis not present

## 2021-11-14 DIAGNOSIS — M961 Postlaminectomy syndrome, not elsewhere classified: Secondary | ICD-10-CM | POA: Diagnosis not present

## 2021-11-14 DIAGNOSIS — M7062 Trochanteric bursitis, left hip: Secondary | ICD-10-CM | POA: Insufficient documentation

## 2021-11-14 MED ORDER — MORPHINE SULFATE ER 30 MG PO TBCR: 30.0000 mg | EXTENDED_RELEASE_TABLET | Freq: Two times a day (BID) | ORAL | 0 refills | Status: DC

## 2021-11-15 ENCOUNTER — Encounter: Payer: Self-pay | Admitting: Registered Nurse

## 2021-12-08 ENCOUNTER — Encounter: Payer: Self-pay | Admitting: Registered Nurse

## 2021-12-08 ENCOUNTER — Encounter: Payer: PPO | Attending: Physical Medicine & Rehabilitation | Admitting: Registered Nurse

## 2021-12-08 VITALS — BP 111/62 | HR 57 | Ht 73.0 in | Wt 259.0 lb

## 2021-12-08 DIAGNOSIS — G894 Chronic pain syndrome: Secondary | ICD-10-CM | POA: Diagnosis not present

## 2021-12-08 DIAGNOSIS — Z5181 Encounter for therapeutic drug level monitoring: Secondary | ICD-10-CM | POA: Insufficient documentation

## 2021-12-08 DIAGNOSIS — M7062 Trochanteric bursitis, left hip: Secondary | ICD-10-CM | POA: Diagnosis not present

## 2021-12-08 DIAGNOSIS — M961 Postlaminectomy syndrome, not elsewhere classified: Secondary | ICD-10-CM | POA: Insufficient documentation

## 2021-12-08 DIAGNOSIS — M5136 Other intervertebral disc degeneration, lumbar region: Secondary | ICD-10-CM | POA: Diagnosis not present

## 2021-12-08 DIAGNOSIS — M62830 Muscle spasm of back: Secondary | ICD-10-CM | POA: Diagnosis not present

## 2021-12-08 DIAGNOSIS — Z79899 Other long term (current) drug therapy: Secondary | ICD-10-CM | POA: Insufficient documentation

## 2021-12-08 DIAGNOSIS — M5416 Radiculopathy, lumbar region: Secondary | ICD-10-CM | POA: Insufficient documentation

## 2021-12-08 DIAGNOSIS — M7061 Trochanteric bursitis, right hip: Secondary | ICD-10-CM | POA: Insufficient documentation

## 2021-12-08 MED ORDER — OXYCODONE HCL 10 MG PO TABS: 10.0000 mg | ORAL_TABLET | Freq: Four times a day (QID) | ORAL | 0 refills | Status: DC | PRN

## 2021-12-08 MED ORDER — MORPHINE SULFATE ER 30 MG PO TBCR: 30.0000 mg | EXTENDED_RELEASE_TABLET | Freq: Two times a day (BID) | ORAL | 0 refills | Status: DC

## 2021-12-08 NOTE — Progress Notes (Signed)
Subjective:    Patient ID: Samuel [name], male    DOB: [birth year], 46 y.o.   MRN: [id number]  HPI: Samuel [name] is a 46 y.o. male who returns for follow up appointment for chronic pain and medication refill. He states his pain is located in his lower back radiating into his bilateral hips and bilateral lower extremities. He  rates his pain 5. His current exercise regime is walking and performing stretching exercises.  Ms. Vu Morphine equivalent is 120.00 MME.   Last UDS was Performed on 09/12/2021, it was consistent.      Pain Inventory Average Pain 6 Pain Right Now 5 My pain is constant, sharp, burning, dull, stabbing, and aching  In the last 24 hours, has pain interfered with the following? General activity 9 Relation with others 9 Enjoyment of life 9 What TIME of day is your pain at its worst? morning , evening, and night Sleep (in general) Fair  Pain is worse with: walking, bending, sitting, inactivity, and standing Pain improves with: rest and medication Relief from Meds: 5  Family History  Problem Relation Age of Onset   Hypertension Father    Social History   Socioeconomic History   Marital status: Married    Spouse name: Not on file   Number of children: Not on file   Years of education: Not on file   Highest education level: Not on file  Occupational History   Not on file  Tobacco Use   Smoking status: Never   Smokeless tobacco: Never  Vaping Use   Vaping Use: Never used  Substance and Sexual Activity   Alcohol use: No    Alcohol/week: 0.0 standard drinks of alcohol   Drug use: No   Sexual activity: Not on file  Other Topics Concern   Not on file  Social History Narrative   Not on file   Social Determinants of Health   Financial Resource Strain: Not on file  Food Insecurity: Not on file  Transportation Needs: Not on file  Physical Activity: Not on file  Stress: Not on file  Social Connections: Not on file   Past Surgical History:   Procedure Laterality Date   BACK SURGERY     lower back surgery x 2   Past Surgical History:  Procedure Laterality Date   BACK SURGERY     lower back surgery x 2   Past Medical History:  Diagnosis Date   Arthritis    "some in my back"   GERD (gastroesophageal reflux disease)    Headache(784.0)    There were no vitals taken for this visit.  Opioid Risk Score:   Fall Risk Score:  `1  Depression screen [facility] 2/9     10/14/2021    9:30 AM 09/12/2021    9:50 AM 08/08/2021   10:07 AM 07/15/2021   10:21 AM 06/17/2021   10:37 AM 05/13/2021   10:09 AM 04/14/2021   10:06 AM  Depression screen PHQ 2/9  Decreased Interest 0 0 1 0 0 0 0  Down, Depressed, Hopeless 1 0 1 0 0 0 0  PHQ - 2 Score 1 0 2 0 0 0 0    Review of Systems  Musculoskeletal:  Positive for back pain.       PAIN DOWN BACK TO BOTH LEGS  All other systems reviewed and are negative.      Objective:   Physical Exam Vitals and nursing note reviewed.  Constitutional:  Appearance: Normal appearance.  Cardiovascular:     Rate and Rhythm: Regular rhythm. Bradycardia present.     Heart sounds: Normal heart sounds.  Pulmonary:     Effort: Pulmonary effort is normal.     Breath sounds: Normal breath sounds.  Musculoskeletal:     Cervical back: Normal range of motion and neck supple.     Comments: Normal Muscle Bulk and Muscle Testing Reveals:  Upper Extremities: Full ROM and Muscle Strength 5/5 Lumbar Hypersensitivity Greater Trochanter Tenderness: L>R Lower Extremities Full ROM and Muscle Strength 5/5 Bilateral Lower Extremities Flexion Produces Pain into His Lumbar  Arises from Table Slowly Antalgic Gait     Skin:    General: Skin is warm and dry.  Neurological:     Mental Status: He is alert and oriented to person, place, and time.  Psychiatric:        Mood and Affect: Mood normal.        Behavior: Behavior normal.         Assessment & Plan:  1.  Lumbar postlaminectomy syndrome with chronic  low back and radicular pain. He has lumbar degenerative disc disease. Continue current medication regimen with  Gabapentin, Continue current Medication regimen . S/P  Left  Lumbar Facet Radiofrequency Ablation with sedation, on 11/12/2019 by Dr. Dossie Arbour with good results noted and S/P Right RFA with Dr. Lowella Dandy on June 24,2021. Mr. Homes was instructed to call Dr Lowella Dandy to schedule injection, he verbalizes understanding. 12/08/2021.  Refilled: MS Contin 30 mg one tablet every 12 hours  #60 and Oxycodone 10 mg one tablet every 6 hours as needed for pain #120. We will continue the opioid monitoring program, this consists of regular clinic visits, examinations, urine drug screen, pill counts as well as use of [place] Controlled Substance Reporting system. A 12 month History has been reviewed on the [place] on 12/08/2021. 2. Sacroiliac Dysfunction: Continue with current medication, heat and exercise regime. 12/08/2021 3. Muscle Spasm: Continue Tizanidine as needed. Continue to Monitor.12/08/2021 4. Chronic Pain Syndrome: Continue MS Contin and Oxycodone as needed for pain. Continue to Monitor. 12/08/2021. 5. Left Greater Trochanteric Tenderness: No complaints today.  Left Hip Osteoarthritis: S/P  left intra-articular hip injection  with 8 days of relief Mr. Choinski reports. Continue with alternating Ice and Heat Therapy. Continue to monitor. 12/08/2021. 6. Bradycardia: Heart Rate is 57 today. He's prescribed Coreg and his  PCP and Gastroenterologist Following he states. We will continue to monitor.12/08/2021 7. Weight Loss: Gastroenterologist Following. Mr. Canizalez states he was instructed to lose weight by his gastroenterologist , he is following a healthy diet regime and exercise. We will continue to monitor. 12/08/2021.   F/U in 1 month

## 2021-12-30 DIAGNOSIS — E119 Type 2 diabetes mellitus without complications: Secondary | ICD-10-CM | POA: Diagnosis not present

## 2021-12-30 DIAGNOSIS — D696 Thrombocytopenia, unspecified: Secondary | ICD-10-CM | POA: Diagnosis not present

## 2021-12-30 DIAGNOSIS — H938X3 Other specified disorders of ear, bilateral: Secondary | ICD-10-CM | POA: Diagnosis not present

## 2022-01-08 DIAGNOSIS — C189 Malignant neoplasm of colon, unspecified: Secondary | ICD-10-CM | POA: Diagnosis not present

## 2022-01-08 DIAGNOSIS — R188 Other ascites: Secondary | ICD-10-CM | POA: Diagnosis not present

## 2022-01-08 DIAGNOSIS — K7581 Nonalcoholic steatohepatitis (NASH): Secondary | ICD-10-CM | POA: Diagnosis not present

## 2022-01-08 DIAGNOSIS — Z6834 Body mass index (BMI) 34.0-34.9, adult: Secondary | ICD-10-CM | POA: Diagnosis not present

## 2022-01-08 DIAGNOSIS — I8510 Secondary esophageal varices without bleeding: Secondary | ICD-10-CM | POA: Diagnosis not present

## 2022-01-08 DIAGNOSIS — K7460 Unspecified cirrhosis of liver: Secondary | ICD-10-CM | POA: Diagnosis not present

## 2022-01-08 DIAGNOSIS — K7689 Other specified diseases of liver: Secondary | ICD-10-CM | POA: Diagnosis not present

## 2022-01-09 ENCOUNTER — Encounter: Payer: Self-pay | Admitting: Registered Nurse

## 2022-01-09 ENCOUNTER — Encounter: Payer: PPO | Attending: Physical Medicine & Rehabilitation | Admitting: Registered Nurse

## 2022-01-09 VITALS — BP 116/73 | HR 56 | Ht 73.0 in | Wt 264.0 lb

## 2022-01-09 DIAGNOSIS — G894 Chronic pain syndrome: Secondary | ICD-10-CM | POA: Diagnosis not present

## 2022-01-09 DIAGNOSIS — M5136 Other intervertebral disc degeneration, lumbar region: Secondary | ICD-10-CM | POA: Insufficient documentation

## 2022-01-09 DIAGNOSIS — M5416 Radiculopathy, lumbar region: Secondary | ICD-10-CM | POA: Insufficient documentation

## 2022-01-09 DIAGNOSIS — Z5181 Encounter for therapeutic drug level monitoring: Secondary | ICD-10-CM | POA: Diagnosis not present

## 2022-01-09 DIAGNOSIS — Z79891 Long term (current) use of opiate analgesic: Secondary | ICD-10-CM | POA: Diagnosis not present

## 2022-01-09 DIAGNOSIS — M961 Postlaminectomy syndrome, not elsewhere classified: Secondary | ICD-10-CM | POA: Insufficient documentation

## 2022-01-09 MED ORDER — OXYCODONE HCL 10 MG PO TABS: 10.0000 mg | ORAL_TABLET | Freq: Four times a day (QID) | ORAL | 0 refills | Status: DC | PRN

## 2022-01-09 MED ORDER — MORPHINE SULFATE ER 30 MG PO TBCR
30.0000 mg | EXTENDED_RELEASE_TABLET | Freq: Two times a day (BID) | ORAL | 0 refills | Status: DC
Start: 2022-01-09 — End: 2022-02-09

## 2022-01-09 MED ORDER — MORPHINE SULFATE ER 30 MG PO TBCR
30.0000 mg | EXTENDED_RELEASE_TABLET | Freq: Two times a day (BID) | ORAL | 0 refills | Status: DC
Start: 2022-01-09 — End: 2022-01-09

## 2022-01-09 NOTE — Progress Notes (Signed)
Subjective:    Patient ID: Samuel [name], male    DOB: 1975-05-28, 46 y.o.   MRN: [id number]  [id number] Samuel [name] is a 46 y.o. male who returns for follow up appointment for chronic pain and medication refill. He states his pain is located in his lower back radiating into his bilateral hips and bilateral lower extremities. He rates his pain 6. His current exercise regime is walking and performing stretching exercises.  Samuel [name] equivalent is 120.00 MME.   UDS ordered today.    Pain Inventory Average Pain 5 Pain Right Now 6 My pain is constant, sharp, burning, dull, stabbing, and aching  In the last 24 hours, has pain interfered with the following? General activity 9 Relation with others 9 Enjoyment of life 9 What TIME of day is your pain at its worst? morning , evening, and night Sleep (in general) Fair  Pain is worse with: walking, bending, sitting, inactivity, standing, and some activites Pain improves with: rest and medication Relief from Meds: 4  Family History  Problem Relation Age of Onset   Hypertension Father    Social History   Socioeconomic History   Marital status: Married    Spouse name: Not on file   Number of children: Not on file   Years of education: Not on file   Highest education level: Not on file  Occupational History   Not on file  Tobacco Use   Smoking status: Never   Smokeless tobacco: Never  Vaping Use   Vaping Use: Never used  Substance and Sexual Activity   Alcohol use: No    Alcohol/week: 0.0 standard drinks of alcohol   Drug use: No   Sexual activity: Not on file  Other Topics Concern   Not on file  Social History Narrative   Not on file   Social Determinants of Health   Financial Resource Strain: Not on file  Food Insecurity: Not on file  Transportation Needs: Not on file  Physical Activity: Not on file  Stress: Not on file  Social Connections: Not on file   Past Surgical History:  Procedure Laterality Date    BACK SURGERY     lower back surgery x 2   Past Surgical History:  Procedure Laterality Date   BACK SURGERY     lower back surgery x 2   Past Medical History:  Diagnosis Date   Arthritis    "some in my back"   GERD (gastroesophageal reflux disease)    Headache(784.0)    BP 116/73   Pulse (!) 53   Ht '6\' 1"'$  (1.854 m)   Wt 264 lb (119.7 kg)   SpO2 96%   BMI 34.83 kg/m   Opioid Risk Score:   Fall Risk Score:  `1  Depression screen [facility] 2/9     01/09/2022    9:55 AM 12/08/2021    9:10 AM 10/14/2021    9:30 AM 09/12/2021    9:50 AM 08/08/2021   10:07 AM 07/15/2021   10:21 AM 06/17/2021   10:37 AM  Depression screen PHQ 2/9  Decreased Interest 1 0 0 0 1 0 0  Down, Depressed, Hopeless 1 0 1 0 1 0 0  PHQ - 2 Score 2 0 1 0 2 0 0      Review of Systems  Musculoskeletal:  Positive for back pain.       Bilateral leg pain  All other systems reviewed and are negative.     Objective:  Physical Exam Vitals and nursing note reviewed.  Constitutional:      Appearance: Normal appearance.  Cardiovascular:     Rate and Rhythm: Normal rate and regular rhythm.     Pulses: Normal pulses.     Heart sounds: Normal heart sounds.  Pulmonary:     Effort: Pulmonary effort is normal.     Breath sounds: Normal breath sounds.  Musculoskeletal:     Cervical back: Normal range of motion and neck supple.     Comments: Normal Muscle Bulk and Muscle Testing Reveals:  Upper Extremities: Full ROM and Muscle Strength 5/5  Lumbar Hypersensitivity Bilateral Greater Trochanter Tenderness Lower Extremities: Decreased ROM and Muscle Strength 5/5 Arises from Table slowly Antalgic  Gait     Skin:    General: Skin is warm and dry.  Neurological:     Mental Status: He is alert and oriented to person, place, and time.  Psychiatric:        Mood and Affect: Mood normal.        Behavior: Behavior normal.         Assessment & Plan:  1.  Lumbar postlaminectomy syndrome with chronic low back  and radicular pain. He has lumbar degenerative disc disease. Continue current medication regimen with  Gabapentin, Continue current Medication regimen . S/P  Left  Lumbar Facet Radiofrequency Ablation with sedation, on 11/12/2019 by Dr. Dossie [name] with good results noted and S/P Right RFA with Dr. Lowella [name] on June 24,2021. Samuel [name] was instructed to call Dr Lowella [name] to schedule injection, he verbalizes understanding. 01/09/2022.  Refilled: MS Contin 30 mg one tablet every 12 hours  #60 and Oxycodone 10 mg one tablet every 6 hours as needed for pain #120. We will continue the opioid monitoring program, this consists of regular clinic visits, examinations, urine drug screen, pill counts as well as use of [place] Controlled Substance Reporting system. A 12 month History has been reviewed on the [place] on 01/09/2022. 2. Sacroiliac Dysfunction: Continue with current medication, heat and exercise regime. 01/09/2022 3. Muscle Spasm: Continue Tizanidine as needed. Continue to Monitor.01/09/2022 4. Chronic Pain Syndrome: Continue MS Contin and Oxycodone as needed for pain. Continue to Monitor. 01/09/2022. 5. Left Greater Trochanteric Tenderness: No complaints today.  Left Hip Osteoarthritis: S/P  left intra-articular hip injection  with 8 days of relief Samuel [name]. Continue with alternating Ice and Heat Therapy. Continue to monitor. 01/09/2022. 6. Bradycardia: Heart Rate is 56 today. He's prescribed Coreg and his  PCP and Gastroenterologist Following he states. We will continue to monitor.01/09/2022 7. Weight Loss: Gastroenterologist Following. Samuel [name] states he was instructed to lose weight by his gastroenterologist , he is following a healthy diet regime and exercise. We will continue to monitor. 01/09/2022.   F/U in 1 month

## 2022-01-13 ENCOUNTER — Telehealth: Payer: Self-pay | Admitting: *Deleted

## 2022-01-13 LAB — TOXASSURE SELECT,+ANTIDEPR,UR

## 2022-01-13 NOTE — Telephone Encounter (Signed)
Urine drug screen for this encounter is consistent for prescribed medication 

## 2022-01-21 ENCOUNTER — Telehealth: Payer: Self-pay | Admitting: *Deleted

## 2022-01-21 NOTE — Telephone Encounter (Signed)
Prior auth for Morphine Sulfate ER 30 mg #60 sent to [employer] via CoverMyMeds.

## 2022-01-22 NOTE — Telephone Encounter (Signed)
Dismissed by [facility] as PA not required.

## 2022-02-08 ENCOUNTER — Other Ambulatory Visit: Payer: Self-pay | Admitting: Registered Nurse

## 2022-02-09 MED ORDER — MORPHINE SULFATE ER 30 MG PO TBCR: 30.0000 mg | EXTENDED_RELEASE_TABLET | Freq: Two times a day (BID) | ORAL | 0 refills | Status: DC

## 2022-02-12 ENCOUNTER — Encounter: Payer: Self-pay | Admitting: Registered Nurse

## 2022-02-12 ENCOUNTER — Encounter: Payer: PPO | Attending: Physical Medicine & Rehabilitation | Admitting: Registered Nurse

## 2022-02-12 VITALS — BP 113/62 | HR 59 | Ht 73.0 in | Wt 262.0 lb

## 2022-02-12 DIAGNOSIS — Z5181 Encounter for therapeutic drug level monitoring: Secondary | ICD-10-CM | POA: Insufficient documentation

## 2022-02-12 DIAGNOSIS — Z79891 Long term (current) use of opiate analgesic: Secondary | ICD-10-CM | POA: Insufficient documentation

## 2022-02-12 DIAGNOSIS — R001 Bradycardia, unspecified: Secondary | ICD-10-CM | POA: Insufficient documentation

## 2022-02-12 DIAGNOSIS — G894 Chronic pain syndrome: Secondary | ICD-10-CM | POA: Diagnosis not present

## 2022-02-12 DIAGNOSIS — M5416 Radiculopathy, lumbar region: Secondary | ICD-10-CM | POA: Insufficient documentation

## 2022-02-12 DIAGNOSIS — M961 Postlaminectomy syndrome, not elsewhere classified: Secondary | ICD-10-CM | POA: Diagnosis not present

## 2022-02-12 DIAGNOSIS — M7062 Trochanteric bursitis, left hip: Secondary | ICD-10-CM | POA: Insufficient documentation

## 2022-02-12 DIAGNOSIS — M5136 Other intervertebral disc degeneration, lumbar region: Secondary | ICD-10-CM | POA: Insufficient documentation

## 2022-02-12 NOTE — Progress Notes (Signed)
Subjective:    Patient ID: Samuel [name], male    DOB: 07-Apr-1976, 46 y.o.   MRN: [id number]  HPI: Samuel [name] is a 46 y.o. male who returns for follow up appointment for chronic pain and medication refill. He states his pain is located in his lower back radiating into his bilateral lower extremities and left hip. He rates his pain 6. His current exercise regime is walking and performing stretching exercises.  Samuel [name] equivalent is 120.00 MME.   UDS was Performed on 01/09/2022, it was consistent.     Pain Inventory Average Pain 6 Pain Right Now 6 My pain is constant, sharp, burning, dull, stabbing, and aching  In the last 24 hours, has pain interfered with the following? General activity 9 Relation with others 9 Enjoyment of life 9 What TIME of day is your pain at its worst? morning , evening, and night Sleep (in general) Fair  Pain is worse with: walking, bending, sitting, inactivity, and standing Pain improves with: rest and medication Relief from Meds: 4  Family History  Problem Relation Age of Onset   Hypertension Father    Social History   Socioeconomic History   Marital status: Married    Spouse name: Not on file   Number of children: Not on file   Years of education: Not on file   Highest education level: Not on file  Occupational History   Not on file  Tobacco Use   Smoking status: Never   Smokeless tobacco: Never  Vaping Use   Vaping Use: Never used  Substance and Sexual Activity   Alcohol use: No    Alcohol/week: 0.0 standard drinks of alcohol   Drug use: No   Sexual activity: Not on file  Other Topics Concern   Not on file  Social History Narrative   Not on file   Social Determinants of Health   Financial Resource Strain: Not on file  Food Insecurity: Not on file  Transportation Needs: Not on file  Physical Activity: Not on file  Stress: Not on file  Social Connections: Not on file   Past Surgical History:  Procedure  Laterality Date   BACK SURGERY     lower back surgery x 2   Past Surgical History:  Procedure Laterality Date   BACK SURGERY     lower back surgery x 2   Past Medical History:  Diagnosis Date   Arthritis    "some in my back"   GERD (gastroesophageal reflux disease)    Headache(784.0)    BP 113/62   Pulse (!) 57   Ht '6\' 1"'$  (1.854 m)   Wt 262 lb (118.8 kg)   SpO2 98%   BMI 34.57 kg/m   Opioid Risk Score:   Fall Risk Score:  `1  Depression screen [facility] 2/9     01/09/2022    9:55 AM 12/08/2021    9:10 AM 10/14/2021    9:30 AM 09/12/2021    9:50 AM 08/08/2021   10:07 AM 07/15/2021   10:21 AM 06/17/2021   10:37 AM  Depression screen PHQ 2/9  Decreased Interest 1 0 0 0 1 0 0  Down, Depressed, Hopeless 1 0 1 0 1 0 0  PHQ - 2 Score 2 0 1 0 2 0 0     Review of Systems  Musculoskeletal:  Positive for back pain.       Bilateral hip and leg pain  All other systems reviewed and are negative.  Objective:   Physical Exam Vitals and nursing note reviewed.  Constitutional:      Appearance: Normal appearance.  Cardiovascular:     Rate and Rhythm: Normal rate and regular rhythm.     Pulses: Normal pulses.     Heart sounds: Normal heart sounds.  Pulmonary:     Effort: Pulmonary effort is normal.     Breath sounds: Normal breath sounds.  Musculoskeletal:     Cervical back: Normal range of motion and neck supple.     Comments: Normal Muscle Bulk and Muscle Testing Reveals:  Upper Extremities: Full ROM and Muscle Strength 5/5 Lumbar Hypersensitivity Lower Extremities: Decreased ROM and Muscle Strength 5/5 Bilateral Lower Extremity Flexion Produces Pain into his Lumbar  Arises from Table slowly using cane for support Antalgic  Gait     Skin:    General: Skin is warm and dry.  Neurological:     Mental Status: He is alert and oriented to person, place, and time.  Psychiatric:        Mood and Affect: Mood normal.        Behavior: Behavior normal.         Assessment &  Plan:  1.  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 He has lumbar degenerative disc disease. Continue current medication regimen with  Gabapentin, Continue current Medication regimen . S/P  Left  Lumbar Facet Radiofrequency Ablation with sedation, on 11/12/2019 by Dr. Dossie Arbour with good results noted and S/P Right RFA with Dr. Lowella Dandy on June 24,2021. [name] was instructed to call Dr Lowella Dandy to schedule injection, he verbalizes understanding. 02/11/2022.  Refilled: MS Contin 30 mg one tablet every 12 hours  #60 and Oxycodone 10 mg one tablet every 6 hours as needed for pain #120. We will continue the opioid monitoring program, this consists of regular clinic visits, examinations, urine drug screen, pill counts as well as use of [place] Controlled Substance Reporting system. A 12 month History has been reviewed on the [place] on 02/11/2022. 2. Sacroiliac Dysfunction: Continue with current medication, heat and exercise regime. 02/11/2022 3. Muscle Spasm: Continue Tizanidine as needed. Continue to Monitor.02/11/2022 4. Chronic Pain Syndrome: Continue MS Contin and Oxycodone as needed for pain. Continue to Monitor. 02/11/2022. 5. Left Greater Trochanteric Tenderness: No complaints today.  Left Hip Osteoarthritis: S/P  left intra-articular hip injection  with 8 days of relief [name] reports. Continue with alternating Ice and Heat Therapy. Continue to monitor. 02/11/2022. 6. Bradycardia: Heart Rate is 59 today. He's prescribed Coreg and his  PCP and Gastroenterologist Following he states. We will continue to monitor.02/11/2022 7. Weight Loss: Gastroenterologist Following. [name] states he was instructed to lose weight by his gastroenterologist , he is following a healthy diet regime and exercise. We will continue to monitor. 02/20/2022.   F/U in 1 month

## 2022-03-09 ENCOUNTER — Encounter: Payer: Self-pay | Admitting: Registered Nurse

## 2022-03-09 ENCOUNTER — Encounter: Payer: PPO | Attending: Physical Medicine & Rehabilitation | Admitting: Registered Nurse

## 2022-03-09 VITALS — BP 114/70 | HR 54 | Ht 72.1 in | Wt 268.8 lb

## 2022-03-09 DIAGNOSIS — G894 Chronic pain syndrome: Secondary | ICD-10-CM | POA: Diagnosis not present

## 2022-03-09 DIAGNOSIS — M961 Postlaminectomy syndrome, not elsewhere classified: Secondary | ICD-10-CM | POA: Diagnosis not present

## 2022-03-09 DIAGNOSIS — Z79891 Long term (current) use of opiate analgesic: Secondary | ICD-10-CM | POA: Diagnosis not present

## 2022-03-09 DIAGNOSIS — Z5181 Encounter for therapeutic drug level monitoring: Secondary | ICD-10-CM | POA: Diagnosis not present

## 2022-03-09 DIAGNOSIS — M5136 Other intervertebral disc degeneration, lumbar region: Secondary | ICD-10-CM | POA: Diagnosis not present

## 2022-03-09 DIAGNOSIS — M5416 Radiculopathy, lumbar region: Secondary | ICD-10-CM | POA: Insufficient documentation

## 2022-03-09 DIAGNOSIS — M7062 Trochanteric bursitis, left hip: Secondary | ICD-10-CM | POA: Insufficient documentation

## 2022-03-09 MED ORDER — OXYCODONE HCL 10 MG PO TABS: 10.0000 mg | ORAL_TABLET | Freq: Four times a day (QID) | ORAL | 0 refills | Status: DC | PRN

## 2022-03-09 MED ORDER — MORPHINE SULFATE ER 30 MG PO TBCR: 30.0000 mg | EXTENDED_RELEASE_TABLET | Freq: Two times a day (BID) | ORAL | 0 refills | Status: DC

## 2022-03-09 NOTE — Progress Notes (Addendum)
Subjective:    Patient ID: Samuel [name], male    DOB: Dec 02, 1975, 46 y.o.   MRN: [id number]  HPI: Samuel [name] is a 46 y.o. male who returns for follow up appointment for chronic pain and medication refill. He states his pain is located in his lower back radiating into his left hip and bilateral lower extremities. He rates his pain 6. His current exercise regime is walking and performing stretching exercises.  Samuel [name] is 120.00 MME.   Last UDS was Performed on 01/09/2022, it was consistent.     Pain Inventory Average Pain 6 Pain Right Now 6 My pain is constant, sharp, burning, dull, stabbing, and aching  In the last 24 hours, has pain interfered with the following? General activity 9 Relation with others 9 Enjoyment of life 9 What TIME of day is your pain at its worst? morning , evening, and night Sleep (in general) Fair  Pain is worse with: walking, bending, sitting, and standing Pain improves with: rest and medication Relief from Meds: 5  Family History  Problem Relation Age of Onset  . Hypertension Father    Social History   Socioeconomic History  . Marital status: Married    Spouse name: Not on file  . Number of children: Not on file  . Years of education: Not on file  . Highest education level: Not on file  Occupational History  . Not on file  Tobacco Use  . Smoking status: Never  . Smokeless tobacco: Never  Vaping Use  . Vaping Use: Never used  Substance and Sexual Activity  . Alcohol use: No    Alcohol/week: 0.0 standard drinks of alcohol  . Drug use: No  . Sexual activity: Not on file  Other Topics Concern  . Not on file  Social History Narrative  . Not on file   Social Determinants of Health   Financial Resource Strain: Not on file  Food Insecurity: Not on file  Transportation Needs: Not on file  Physical Activity: Not on file  Stress: Not on file  Social Connections: Not on file   Past Surgical History:   Procedure Laterality Date  . BACK SURGERY     lower back surgery x 2   Past Surgical History:  Procedure Laterality Date  . BACK SURGERY     lower back surgery x 2   Past Medical History:  Diagnosis Date  . Arthritis    "some in my back"  . GERD (gastroesophageal reflux disease)   . Headache(784.0)    There were no vitals taken for this visit.  Opioid Risk Score:   Fall Risk Score:  `1  Depression screen [facility] 2/9     01/09/2022    9:55 AM 12/08/2021    9:10 AM 10/14/2021    9:30 AM 09/12/2021    9:50 AM 08/08/2021   10:07 AM 07/15/2021   10:21 AM 06/17/2021   10:37 AM  Depression screen PHQ 2/9  Decreased Interest 1 0 0 0 1 0 0  Down, Depressed, Hopeless 1 0 1 0 1 0 0  PHQ - 2 Score 2 0 1 0 2 0 0     Review of Systems  Constitutional: Negative.   HENT: Negative.    Eyes: Negative.   Respiratory: Negative.    Gastrointestinal: Negative.   Endocrine: Negative.   Genitourinary: Negative.   Musculoskeletal:  Positive for back pain.  Skin: Negative.   Allergic/Immunologic: Negative.   Neurological: Negative.  Hematological: Negative.   Psychiatric/Behavioral: Negative.    All other systems reviewed and are negative.     Objective:   Physical Exam Vitals and nursing note reviewed.  Constitutional:      Appearance: Normal appearance.  Cardiovascular:     Rate and Rhythm: Normal rate and regular rhythm.     Pulses: Normal pulses.     Heart sounds: Normal heart sounds.  Pulmonary:     Effort: Pulmonary effort is normal.     Breath sounds: Normal breath sounds.  Musculoskeletal:     Cervical back: Normal range of motion and neck supple.     Comments: Normal Muscle Bulk and Muscle Testing Reveals:  Upper Extremities: Full ROM and Muscle Strength 5/5  Lumbar Hypersensitivity Left Greater Trochanter Tenderness Lower Extremities: Decreased ROM and Muscle Strength 5/5 Arises from Table slowly using cane for support Antalgic  Gait     Skin:    General: Skin is  warm and dry.  Neurological:     Mental Status: He is alert and oriented to person, place, and time.  Psychiatric:        Mood and Affect: Mood normal.        Behavior: Behavior normal.         Assessment & Plan:  1.  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 He has lumbar degenerative disc disease. Continue current medication regimen with  Gabapentin, Continue current Medication regimen . S/P  Left  Lumbar Facet Radiofrequency Ablation with sedation, on 11/12/2019 by Dr. Dossie Arbour with good results noted and S/P Right RFA with Dr. Lowella Dandy on June 24,2021. [name] was  reminded again  to call Dr Lowella Dandy to schedule injection, he verbalizes understanding. 03/09/2022.  Refilled: MS Contin 30 mg one tablet every 12 hours  #60 and Oxycodone 10 mg one tablet every 6 hours as needed for pain #120. We will continue the opioid monitoring program, this consists of regular clinic visits, examinations, urine drug screen, pill counts as well as use of [place] Controlled Substance Reporting system. A 12 month History has been reviewed on the [place] on 03/09/2022. 2. Sacroiliac Dysfunction: Continue with current medication, heat and exercise regime. 03/09/2022 3. Muscle Spasm: Continue Tizanidine as needed. Continue to Monitor.03/09/2022 4. Chronic Pain Syndrome: Continue MS Contin and Oxycodone as needed for pain. Continue to Monitor. 03/09/2022. 5. Left Greater Trochanteric Tenderness: Left Hip Osteoarthritis: S/P  left intra-articular hip injection  with 8 days of relief [name] reports. Continue with alternating Ice and Heat Therapy. Continue to monitor. 03/09/2022. 6. Bradycardia: Heart Rate is 54 today. He's prescribed Coreg and his  PCP and Gastroenterologist Following he states. We will continue to monitor.03/09/2022 7. Weight Loss: Gastroenterologist Following. [name] states he was instructed to lose weight by his  gastroenterologist , he is following a healthy diet regime and exercise. We will continue to monitor. 03/09/2022.   F/U in 1 month

## 2022-03-25 NOTE — Progress Notes (Unsigned)
PROVIDER NOTE: Information contained herein reflects review and annotations entered in association with encounter. Interpretation of such information and data should be left to medically-trained personnel. Information provided to patient can be located elsewhere in the medical record under "Patient Instructions". Document created using STT-dictation technology, any transcriptional errors that may result from process are unintentional.    Patient: Samuel [name]  Service Category: E/M  Provider: Gaspar Cola, MD  DOB: 07/09/1975  DOS: 04/06/2022  Referring Provider: Annye English  MRN: [id number]  Specialty: Interventional Pain Management  PCP: Loyola Mast, PA-C  Type: Established Patient  Setting: Ambulatory outpatient    Location: Office  Delivery: Face-to-face     HPI  [name], a 46 y.o. year old male, is here today because of his No primary diagnosis found.. Mr. Poynter primary complain today is No chief complaint on file. Last encounter: My last encounter with him was on Visit date not found. Pertinent problems: Mr. 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 Pain Assessment: Severity of   is reported as a  /10. Location:    / . Onset:  . Quality:  . Timing:  . Modifying factor(s):   Marland Kitchen Vitals:  vitals were not taken for this visit.   Reason for encounter:  *** . ***  Pharmacotherapy Assessment  Analgesic: {There is no content from the last Subjective section.}   Monitoring: [place] PMP: PDMP reviewed during this encounter.       Pharmacotherapy: No side-effects or adverse reactions reported. Compliance: No problems identified. Effectiveness: Clinically acceptable.  No notes on file  No results found for: "CBDTHCR" No results found for: "D8THCCBX" No results found for: "D9THCCBX"  UDS:  Summary  Date Value Ref Range Status  01/09/2022 Note  Final    Comment:    ==================================================================== ToxAssure Select,+Antidepr,UR ==================================================================== Test                             Result       Flag       Units  Drug Present   Morphine                       >11494                  ng/mg creat   Normorphine                    272                     ng/mg creat    Potential sources of large amounts of morphine in the absence of    codeine include administration of morphine or use of heroin.     Normorphine is an expected metabolite of morphine.    Hydromorphone  120                     ng/mg creat    Hydromorphone may be present as a metabolite of morphine;    concentrations of hydromorphone rarely exceed 5% of the morphine    concentration when this is the source of hydromorphone.    Oxycodone                      3529                    ng/mg creat   Oxymorphone                    122                     ng/mg creat   Noroxycodone                   4448                    ng/mg creat   Noroxymorphone                 80                      ng/mg creat    Sources of oxycodone are scheduled prescription medications.    Oxymorphone, noroxycodone, and noroxymorphone are expected    metabolites of oxycodone. Oxymorphone is also available as a    scheduled  prescription medication.  ==================================================================== Test                      Result    Flag   Units      Ref Range   Creatinine              87               mg/dL      >=20 ==================================================================== Declared Medications:  Medication list was not provided. ==================================================================== For clinical consultation, please call [phone]. ====================================================================       ROS  Constitutional: Denies any fever or chills Gastrointestinal: No reported hemesis, hematochezia, vomiting, or acute GI distress Musculoskeletal: Denies any acute onset joint swelling, redness, loss of ROM, or weakness Neurological: No reported episodes of acute onset apraxia, aphasia, dysarthria, agnosia, amnesia, paralysis, loss of coordination, or loss of consciousness  Medication Review  Acidophilus, Hepatitis A-Hep B Recomb Vac, Oxycodone HCl, carvedilol, gabapentin, lactulose, morphine, multivitamin, pantoprazole, and tiZANidine  History Review  Allergy: Mr. Ogborn has No Known Allergies. Drug: Mr. Cuevas  reports no history of drug use. Alcohol:  reports no history of alcohol use. Tobacco:  reports that he has never smoked. He has never used smokeless tobacco. Social: Mr. Mundis  reports that he has never smoked. He has never used smokeless tobacco. He reports that he does not drink alcohol and does not use drugs. Medical:  has a past medical history of Arthritis, GERD (gastroesophageal reflux disease), and Headache(784.0). Surgical: Mr. Duddy  has a past surgical history that includes Back surgery. Family: family history includes Hypertension in his father.  Laboratory Chemistry Profile   Renal Lab Results  Component Value Date   BUN 9 06/13/2013   CREATININE 0.97 06/13/2013   LABCREA 113.87 02/04/2015   GFRAA >90 06/13/2013   GFRNONAA  >90 06/13/2013    Hepatic Lab Results  Component Value Date   AST 36 06/02/2013   ALT 47 06/02/2013   ALBUMIN 3.5 06/02/2013   ALKPHOS 78 06/02/2013    Electrolytes Lab Results  Component Value Date   NA 136 (L) 06/13/2013   K 4.4 06/13/2013   CL 100 06/13/2013   CALCIUM 8.1 (L) 06/13/2013    Bone No results found for: "VD25OH", "VD125OH2TOT", "QB1694HW3", "UU8280KL4", "25OHVITD1", "25OHVITD2", "25OHVITD3", "TESTOFREE", "TESTOSTERONE"  Inflammation (CRP: Acute Phase) (ESR: Chronic Phase) No results found for: "CRP", "ESRSEDRATE", "LATICACIDVEN"       Note: Above Lab results reviewed.  Recent Imaging Review  DG HIP UNILAT W OR W/O PELVIS 2-3 VIEWS LEFT CLINICAL DATA:  Onset left hip pain 2.5 weeks.  No known injury.  EXAM: DG HIP (WITH OR WITHOUT PELVIS) 2-3V LEFT  COMPARISON:  None.  FINDINGS: There is no evidence of hip fracture or dislocation. Mild bilateral hip degenerative disease. Postoperative change of lower lumbar fusion is noted.  IMPRESSION: Mild bilateral hip degenerative disease.  Electronically Signed   By: Inge Rise M.D.   On: 12/08/2019 15:21 Note: Reviewed        Physical Exam  General appearance: Well nourished, well developed, and well hydrated. In no apparent acute distress Mental status: Alert, oriented x 3 (person, place, & time)       Respiratory: No evidence of acute respiratory distress Eyes: PERLA Vitals: There were no vitals taken for this visit. BMI: Estimated body mass index is 36.35 kg/m as calculated from the following:   Height as of 03/09/22: 6' 0.1" (1.831 m).   Weight as of 03/09/22: 268 lb 12.8 oz (121.9 kg). Ideal: Patient weight not recorded  Assessment   Diagnosis Status  No diagnosis found. Controlled Controlled Controlled   Updated Problems: No problems updated.   Plan of Care  Problem-specific:  No problem-specific Assessment & Plan notes found for this encounter.  Mr. Oluwatomisin Deman has a  current medication list which includes the following long-term medication(s): carvedilol and gabapentin.  Pharmacotherapy (Medications Ordered): No orders of the defined types were placed in this encounter.  Orders:  No orders of the defined types were placed in this encounter.  Follow-up plan:   No follow-ups on file.     {There is no content from the last Plan section.}   Recent Visits No visits were found meeting these conditions. Showing recent visits within past 90 days and meeting all other requirements Future Appointments Date Type Provider Dept  04/06/22 Appointment Milinda Pointer, MD [facility]  Showing future appointments within next 90 days and meeting all other requirements  I discussed the assessment and treatment plan with the patient. The patient was provided an opportunity to ask questions and all were answered. The patient agreed with the plan and demonstrated an understanding of the instructions.  Patient advised to call back or seek an in-person evaluation if the symptoms or condition worsens.  Duration of encounter: *** minutes.  Total time on encounter, as per AMA guidelines included both the face-to-face and non-face-to-face time personally spent by the physician and/or other qualified health care professional(s) on the day of the encounter (includes time in activities that require the physician or other qualified health care professional and does not include time in activities normally performed by clinical staff). Physician's time may include the following activities when performed: preparing to see the patient (eg, review of tests, pre-charting review of records) obtaining and/or reviewing separately obtained history performing a medically appropriate examination and/or evaluation  counseling and educating the patient/family/caregiver ordering medications, tests, or procedures referring and communicating with other health care professionals (when  not separately reported) documenting clinical information in the electronic or other health record independently interpreting results (not separately reported) and communicating results to the patient/ family/caregiver care coordination (not separately reported)  Note by: Gaspar Cola, MD Date: 04/06/2022; Time: 5:49 PM

## 2022-03-25 NOTE — Patient Instructions (Addendum)
____________________________________________________________________________________________  Patient Information update  To: All of our patients.  Re: Name change.  It has been made official that our current name, "[facility]"   will soon be changed to "[facility]".   The purpose of this change is to eliminate any confusion created by the concept of our practice being a "Medication Management Pain Clinic". In the past this has led to the misconception that we treat pain primarily by the use of prescription medications.  Nothing can be farther from the truth.   Understanding PAIN MANAGEMENT: To further understand what our practice does, you first have to understand that "Pain Management" is a subspecialty that requires additional training once a physician has completed their specialty training, which can be in either Anesthesia, Neurology, Psychiatry, or Physical Medicine and Rehabilitation (PMR). Each one of these contributes to the final approach taken by each physician to the management of their patient's pain. To be a "Pain Management Specialist" you must have first completed one of the specialty trainings below.  Anesthesiologists - trained in clinical pharmacology and interventional techniques such as nerve blockade and regional as well as central neuroanatomy. They are trained to block pain before, during, and after surgical interventions.  Neurologists - trained in the diagnosis and pharmacological treatment of complex neurological conditions, such as Multiple Sclerosis, Parkinson's, spinal cord injuries, and other systemic conditions that may be associated with symptoms that may include but are not limited to pain. They tend to rely primarily on the treatment of chronic pain using prescription medications.  Psychiatrist - trained in conditions affecting the psychosocial wellbeing  of patients including but not limited to depression, anxiety, schizophrenia, personality disorders, addiction, and other substance use disorders that may be associated with chronic pain. They tend to rely primarily on the treatment of chronic pain using prescription medications.   Physical Medicine and Rehabilitation (PMR) physicians, also known as physiatrists - trained to treat a wide variety of medical conditions affecting the brain, spinal cord, nerves, bones, joints, ligaments, muscles, and tendons. Their training is primarily aimed at treating patients that have suffered injuries that have caused severe physical impairment. Their training is primarily aimed at the physical therapy and rehabilitation of those patients. They may also work alongside orthopedic surgeons or neurosurgeons using their expertise in assisting surgical patients to recover after their surgeries.  INTERVENTIONAL PAIN MANAGEMENT is sub-subspecialty of Pain Management.  Our physicians are Board-certified in Anesthesia, Pain Management, and Interventional Pain Management.  This meaning that not only have they been trained and Board-certified in their specialty of Anesthesia, and subspecialty of Pain Management, but they have also received further training in the sub-subspecialty of Interventional Pain Management, in order to become Board-certified as INTERVENTIONAL PAIN MANAGEMENT SPECIALIST.    Mission: Our goal is to use our skills in  [place] as alternatives to the chronic use of prescription opioid medications for the treatment of pain. To make this more clear, we have changed our name to reflect what we do and offer. We will continue to offer medication management assessment and recommendations, but we will not be taking over any patient's medication  management.  ____________________________________________________________________________________________  ______________________________________________________________________  Preparing for your procedure  During your procedure appointment there will be: No Prescription Refills. No disability issues to discussed. No medication changes or discussions.  Instructions: Food intake: Avoid eating anything solid for at least 8 hours prior to your procedure. Clear liquid intake:  You may take clear liquids such as water up to 2 hours prior to your procedure. (No carbonated drinks. No soda.) Transportation: Unless otherwise stated by your physician, bring a driver. Morning Medicines: Except for blood thinners, take all of your other morning medications with a sip of water. Make sure to take your heart and blood pressure medicines. If your blood pressure's lower number is above 100, the case will be rescheduled. Blood thinners: If you take a blood thinner, but were not instructed to stop it, call our office [phone] and ask to talk to a nurse. Not stopping a blood thinner prior to certain procedures could lead to serious complications. Diabetics on insulin: Notify the staff so that you can be scheduled 1st case in the morning. If your diabetes requires high dose insulin, take only  of your normal insulin dose the morning of the procedure and notify the staff that you have done so. Preventing infections: Shower with an antibacterial soap the morning of your procedure.  Build-up your immune system: Take 1000 mg of Vitamin C with every meal (3 times a day) the day prior to your procedure. Antibiotics: Inform the nursing staff if you are taking any antibiotics or if you have any conditions that may require antibiotics prior to procedures. (Example: recent joint implants)   Pregnancy: If you are pregnant make sure to notify the nursing staff. Not doing so may result in injury to the fetus, including  death.  Sickness: If you have a cold, fever, or any active infections, call and cancel or reschedule your procedure. Receiving steroids while having an infection may result in complications. Arrival: You must be in the facility at least 30 minutes prior to your scheduled procedure. Tardiness: Your scheduled time is also the cutoff time. If you do not arrive at least 15 minutes prior to your procedure, you will be rescheduled.  Children: Do not bring any children with you. Make arrangements to keep them home. Dress appropriately: There is always a possibility that your clothing may get soiled. Avoid long dresses. Valuables: Do not bring any jewelry or valuables.  Reasons to call and reschedule or cancel your procedure: (Following these recommendations will minimize the risk of a serious complication.) Surgeries: Avoid having procedures within 2 weeks of any surgery. (Avoid for 2 weeks before or after any surgery). Flu Shots: Avoid having procedures within 2 weeks of a flu shots or . (Avoid for 2 weeks before or after immunizations). Barium: Avoid having a procedure within 7-10 days after having had a radiological study involving the use of radiological contrast. (Myelograms, Barium swallow or enema study). Heart attacks: Avoid any elective procedures or surgeries for the initial 6 months after a "Myocardial Infarction" (Heart Attack). Blood thinners: It is imperative that you stop these medications before procedures. Let us know if you if you take any blood thinner.  Infection: Avoid procedures during or within two weeks of an infection (including chest colds or gastrointestinal problems). Symptoms associated with infections include: Localized redness, fever, chills, night sweats or profuse sweating, burning sensation when voiding, cough, congestion, stuffiness, runny nose, sore throat, diarrhea, nausea, vomiting, cold or Flu symptoms, recent or current infections. It is specially important if the  infection is over the area that we intend to treat. Heart and lung problems: Symptoms that may suggest an active cardiopulmonary problem include: cough, chest pain, breathing difficulties or shortness of breath, dizziness, ankle swelling, uncontrolled high or unusually low blood pressure, and/or palpitations. If you are experiencing  any of these symptoms, cancel your procedure and contact your primary care physician for an evaluation.  Remember:  Regular Business hours are:  Monday to Thursday 8:00 AM to 4:00 PM  Provider's Schedule: Milinda Pointer, MD:  Procedure days: Tuesday and Thursday 7:30 AM to 4:00 PM  Gillis Santa, MD:  Procedure days: Monday and Wednesday 7:30 AM to 4:00 PM  ______________________________________________________________________  ____________________________________________________________________________________________  General Risks and Possible Complications  Patient Responsibilities: It is important that you read this as it is part of your informed consent. It is our duty to inform you of the risks and possible complications associated with treatments offered to you. It is your responsibility as a patient to read this and to ask questions about anything that is not clear or that you believe was not covered in this document.  Patient's Rights: You have the right to refuse treatment. You also have the right to change your mind, even after initially having agreed to have the treatment done. However, under this last option, if you wait until the last second to change your mind, you may be charged for the materials used up to that point.  Introduction: Medicine is not an Chief Strategy Officer. Everything in Medicine, including the lack of treatment(s), carries the potential for danger, harm, or loss (which is by definition: Risk). In Medicine, a complication is a secondary problem, condition, or disease that can aggravate an already existing one. All treatments carry the  risk of possible complications. The fact that a side effects or complications occurs, does not imply that the treatment was conducted incorrectly. It must be clearly understood that these can happen even when everything is done following the highest safety standards.  No treatment: You can choose not to proceed with the proposed treatment alternative. The "PRO(s)" would include: avoiding the risk of complications associated with the therapy. The "CON(s)" would include: not getting any of the treatment benefits. These benefits fall under one of three categories: diagnostic; therapeutic; and/or palliative. Diagnostic benefits include: getting information which can ultimately lead to improvement of the disease or symptom(s). Therapeutic benefits are those associated with the successful treatment of the disease. Finally, palliative benefits are those related to the decrease of the primary symptoms, without necessarily curing the condition (example: decreasing the pain from a flare-up of a chronic condition, such as incurable terminal cancer).  General Risks and Complications: These are associated to most interventional treatments. They can occur alone, or in combination. They fall under one of the following six (6) categories: no benefit or worsening of symptoms; bleeding; infection; nerve damage; allergic reactions; and/or death. No benefits or worsening of symptoms: In Medicine there are no guarantees, only probabilities. No healthcare provider can ever guarantee that a medical treatment will work, they can only state the probability that it may. Furthermore, there is always the possibility that the condition may worsen, either directly, or indirectly, as a consequence of the treatment. Bleeding: This is more common if the patient is taking a blood thinner, either prescription or over the counter (example: Goody Powders, Fish oil, Aspirin, Garlic, etc.), or if suffering a condition associated with impaired  coagulation (example: Hemophilia, cirrhosis of the liver, low platelet counts, etc.). However, even if you do not have one on these, it can still happen. If you have any of these conditions, or take one of these drugs, make sure to notify your treating physician. Infection: This is more common in patients with a compromised immune system, either due to disease (example:  diabetes, cancer, human immunodeficiency virus [HIV], etc.), or due to medications or treatments (example: therapies used to treat cancer and rheumatological diseases). However, even if you do not have one on these, it can still happen. If you have any of these conditions, or take one of these drugs, make sure to notify your treating physician. Nerve Damage: This is more common when the treatment is an invasive one, but it can also happen with the use of medications, such as those used in the treatment of cancer. The damage can occur to small secondary nerves, or to large primary ones, such as those in the spinal cord and brain. This damage may be temporary or permanent and it may lead to impairments that can range from temporary numbness to permanent paralysis and/or brain death. Allergic Reactions: Any time a substance or material comes in contact with our body, there is the possibility of an allergic reaction. These can range from a mild skin rash (contact dermatitis) to a severe systemic reaction (anaphylactic reaction), which can result in death. Death: In general, any medical intervention can result in death, most of the time due to an unforeseen complication. ____________________________________________________________________________________________

## 2022-03-27 DIAGNOSIS — K089 Disorder of teeth and supporting structures, unspecified: Secondary | ICD-10-CM | POA: Diagnosis not present

## 2022-03-27 DIAGNOSIS — K047 Periapical abscess without sinus: Secondary | ICD-10-CM | POA: Diagnosis not present

## 2022-04-06 ENCOUNTER — Ambulatory Visit (HOSPITAL_BASED_OUTPATIENT_CLINIC_OR_DEPARTMENT_OTHER): Payer: PPO | Admitting: Pain Medicine

## 2022-04-06 ENCOUNTER — Ambulatory Visit
Admission: RE | Admit: 2022-04-06 | Discharge: 2022-04-06 | Disposition: A | Discharge status: Home / Self Care | Payer: PPO | Source: Ambulatory Visit | Attending: Pain Medicine | Admitting: Pain Medicine

## 2022-04-06 ENCOUNTER — Encounter: Payer: Self-pay | Admitting: Pain Medicine

## 2022-04-06 VITALS — BP 119/60 | HR 53 | Temp 97.4°F | Ht 73.0 in | Wt 260.0 lb

## 2022-04-06 DIAGNOSIS — M47817 Spondylosis without myelopathy or radiculopathy, lumbosacral region: Secondary | ICD-10-CM

## 2022-04-06 DIAGNOSIS — M545 Low back pain, unspecified: Secondary | ICD-10-CM | POA: Insufficient documentation

## 2022-04-06 DIAGNOSIS — M961 Postlaminectomy syndrome, not elsewhere classified: Secondary | ICD-10-CM | POA: Insufficient documentation

## 2022-04-06 DIAGNOSIS — G8929 Other chronic pain: Secondary | ICD-10-CM

## 2022-04-06 DIAGNOSIS — M47816 Spondylosis without myelopathy or radiculopathy, lumbar region: Secondary | ICD-10-CM | POA: Insufficient documentation

## 2022-04-06 DIAGNOSIS — Z981 Arthrodesis status: Secondary | ICD-10-CM | POA: Diagnosis not present

## 2022-04-06 NOTE — Progress Notes (Signed)
Safety precautions to be maintained throughout the outpatient stay will include: orient to surroundings, keep bed in low position, maintain call bell within reach at all times, provide assistance with transfer out of bed and ambulation.   Instructed NPO after MN; bring driver; not on any blood thinners, diabetic medications, or BP meds and pt verb u/o

## 2022-04-07 ENCOUNTER — Encounter: Payer: PPO | Attending: Physical Medicine & Rehabilitation | Admitting: Registered Nurse

## 2022-04-07 ENCOUNTER — Encounter: Payer: Self-pay | Admitting: Registered Nurse

## 2022-04-07 VITALS — BP 112/70 | HR 54 | Ht 73.0 in | Wt 264.0 lb

## 2022-04-07 DIAGNOSIS — M7061 Trochanteric bursitis, right hip: Secondary | ICD-10-CM | POA: Diagnosis not present

## 2022-04-07 DIAGNOSIS — M5416 Radiculopathy, lumbar region: Secondary | ICD-10-CM | POA: Insufficient documentation

## 2022-04-07 DIAGNOSIS — M7062 Trochanteric bursitis, left hip: Secondary | ICD-10-CM | POA: Diagnosis not present

## 2022-04-07 DIAGNOSIS — Z79891 Long term (current) use of opiate analgesic: Secondary | ICD-10-CM | POA: Diagnosis not present

## 2022-04-07 DIAGNOSIS — M961 Postlaminectomy syndrome, not elsewhere classified: Secondary | ICD-10-CM | POA: Diagnosis not present

## 2022-04-07 DIAGNOSIS — Z5181 Encounter for therapeutic drug level monitoring: Secondary | ICD-10-CM | POA: Diagnosis not present

## 2022-04-07 DIAGNOSIS — M5136 Other intervertebral disc degeneration, lumbar region: Secondary | ICD-10-CM | POA: Diagnosis not present

## 2022-04-07 DIAGNOSIS — G894 Chronic pain syndrome: Secondary | ICD-10-CM | POA: Insufficient documentation

## 2022-04-07 MED ORDER — OXYCODONE HCL 10 MG PO TABS: 10.0000 mg | ORAL_TABLET | Freq: Four times a day (QID) | ORAL | 0 refills | Status: DC | PRN

## 2022-04-07 MED ORDER — MORPHINE SULFATE ER 30 MG PO TBCR: 30.0000 mg | EXTENDED_RELEASE_TABLET | Freq: Two times a day (BID) | ORAL | 0 refills | Status: DC

## 2022-04-07 NOTE — Progress Notes (Signed)
Subjective:    Patient ID: Samuel [name], male    DOB: 1976/01/10, 46 y.o.   MRN: [id number]  HPI: Samuel [name] is a 46 y.o. male who returns for follow up appointment for chronic pain and medication refill. He  states his pain is located in his lower back radiating into his bilateral hips and bilateral lower extremities. He seen Dr Dossie Arbour on 04/06/2022, he is scheduled for radiofrequency at the end of the month, awaiting insurance approval. He rates his pain 6. His current exercise regime is walking and performing stretching exercises.  [name] Morphine equivalent is 120.00 MME.  Last UDS was Performed on 01/09/2022, it was consistent.      Pain Inventory Average Pain 6 Pain Right Now 6 My pain is constant, sharp, burning, dull, stabbing, and aching  In the last 24 hours, has pain interfered with the following? General activity 9 Relation with others 9 Enjoyment of life 9 What TIME of day is your pain at its worst? morning , evening, and night Sleep (in general) Fair  Pain is worse with: walking, bending, sitting, inactivity, and standing Pain improves with: rest and medication Relief from Meds: 5  Family History  Problem Relation Age of Onset   Hypertension Father    Social History   Socioeconomic History   Marital status: Married    Spouse name: Not on file   Number of children: Not on file   Years of education: Not on file   Highest education level: Not on file  Occupational History   Not on file  Tobacco Use   Smoking status: Never   Smokeless tobacco: Never  Vaping Use   Vaping Use: Never used  Substance and Sexual Activity   Alcohol use: No    Alcohol/week: 0.0 standard drinks of alcohol   Drug use: No   Sexual activity: Not on file  Other Topics Concern   Not on file  Social History Narrative   Not on file   Social Determinants of Health   Financial Resource Strain: Not on file  Food Insecurity: Not on file  Transportation Needs: Not  on file  Physical Activity: Not on file  Stress: Not on file  Social Connections: Not on file   Past Surgical History:  Procedure Laterality Date   BACK SURGERY     lower back surgery x 2   Past Surgical History:  Procedure Laterality Date   BACK SURGERY     lower back surgery x 2   Past Medical History:  Diagnosis Date   Arthritis    "some in my back"   GERD (gastroesophageal reflux disease)    Headache(784.0)    Ht '6\' 1"'$  (1.854 m)   Wt 264 lb (119.7 kg)   BMI 34.83 kg/m   Opioid Risk Score:   Fall Risk Score:  `1  Depression screen [facility] 2/9     04/07/2022    9:58 AM 04/06/2022    8:24 AM 01/09/2022    9:55 AM 12/08/2021    9:10 AM 10/14/2021    9:30 AM 09/12/2021    9:50 AM 08/08/2021   10:07 AM  Depression screen PHQ 2/9  Decreased Interest 1 0 1 0 0 0 1  Down, Depressed, Hopeless 1 0 1 0 1 0 1  PHQ - 2 Score 2 0 2 0 1 0 2    Review of Systems  Musculoskeletal:  Positive for back pain and gait problem.  Pain in both legs  All other systems reviewed and are negative.      Objective:   Physical Exam Vitals and nursing note reviewed.  Constitutional:      Appearance: Normal appearance.  Cardiovascular:     Rate and Rhythm: Regular rhythm. Bradycardia present.     Pulses: Normal pulses.     Heart sounds: Normal heart sounds.  Pulmonary:     Effort: Pulmonary effort is normal.     Breath sounds: Normal breath sounds.  Musculoskeletal:     Cervical back: Normal range of motion and neck supple.     Comments: Normal Muscle Bulk and Muscle Testing Reveals:  Upper Extremities: Full ROM and Muscle Strength 5/5  Lumbar Hypersensitivity Lower Extremities: Decreased ROM and Muscle Strength 5/5 Bilateral Lower Extremities Flexion Produces Pain into his Lumbar Arises from Table Slowly using cane for support Antalgic  Gait     Skin:    General: Skin is warm and dry.  Neurological:     Mental Status: He is alert and oriented to person, place, and time.   Psychiatric:        Mood and Affect: Mood normal.        Behavior: Behavior normal.         Assessment & Plan:  1.  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 He has lumbar degenerative disc disease. Continue current medication regimen with  Gabapentin, Continue current Medication regimen . S/P  Left  Lumbar Facet Radiofrequency Ablation with sedation, on 11/12/2019 by Dr. Dossie Arbour with good results noted and S/P Right RFA with Dr. Lowella Dandy on June 24,2021. [name] had an appointment with Dr Lowella Dandy on 04/06/2022 he is  scheduled for radiofrequency, 04/07/2022.  Refilled: MS Contin 30 mg one tablet every 12 hours  #60 and Oxycodone 10 mg one tablet every 6 hours as needed for pain #120. We will continue the opioid monitoring program, this consists of regular clinic visits, examinations, urine drug screen, pill counts as well as use of [place] Controlled Substance Reporting system. A 12 month History has been reviewed on the [facility] on 04/07/2022. 2. Sacroiliac Dysfunction: Continue with current medication, heat and exercise regime. 04/07/2022 3. Muscle Spasm: Continue Tizanidine as needed. Continue to Monitor.04/07/2022 4. Chronic Pain Syndrome: Continue MS Contin and Oxycodone as needed for pain. Continue to Monitor. 04/07/2022. 5. Left Greater Trochanteric Tenderness: Left Hip Osteoarthritis: S/P  left intra-articular hip injection  with 8 days of relief [name] reports. Continue with alternating Ice and Heat Therapy. Continue to monitor. 04/07/2022. 6. Bradycardia: Heart Rate is 54 today. He's prescribed Coreg and his  PCP and Gastroenterologist Following he states. We will continue to monitor.04/07/2022 7. Weight Loss: Gastroenterologist Following. [name] states he was instructed to lose weight by his gastroenterologist , he is following a healthy diet regime and exercise. We will continue to monitor.  04/07/2022.   F/U in 1 month

## 2022-04-18 NOTE — Progress Notes (Signed)
PROVIDER NOTE: Interpretation of information contained herein should be left to medically-trained personnel. Specific patient instructions are provided elsewhere under "Patient Instructions" section of medical record. This document was created in part using STT-dictation technology, any transcriptional errors that may result from this process are unintentional.  Patient: Samuel [name] Type: Established DOB: January 29, 1976 MRN: [id number] PCP: Loyola Mast, PA-C  Service: Procedure DOS: 04/21/2022 Setting: Ambulatory Location: Ambulatory outpatient facility Delivery: Face-to-face Provider: Gaspar Cola, MD Specialty: Interventional Pain Management Specialty designation: 09 Location: Outpatient facility Ref. Prov.: Loyola Mast, PA-C    Procedure:           Type: Lumbar Facet, Medial Branch Radiofrequency Ablation (RFA) #4  Laterality: Left (-LT)  Level: L2, L3, L4, L5, & S1 Medial Branch Level(s). These levels will denervate the L3-4, L4-5 and L5-S1 lumbar facet joints.  Imaging: Fluoroscopy-guided         Anesthesia: Local anesthesia (1-2% Lidocaine) Anxiolysis: IV Versed 3.0 mg Sedation: Moderate Sedation Fentanyl 2.0 mL (100 mcg) DOS: 04/21/2022  Performed by: Gaspar Cola, MD  Purpose: Therapeutic/Palliative Indications: Low back pain severe enough to impact quality of life or function. Indications: 1. Lumbar facet syndrome (Bilateral) (R>L)   2. Spondylosis without myelopathy or radiculopathy, lumbosacral region   3. DDD (degenerative disc disease), lumbar   4. Chronic low back pain (1ry area of Pain) (Bilateral) (R>L) w/o sciatica   5. Lumbar post-laminectomy syndrome    Samuel [name] has been dealing with the above chronic pain for longer than three months and has either failed to respond, was unable to tolerate, or simply did not get enough benefit from other more conservative therapies including, but not limited to: 1. Over-the-counter medications 2.  Anti-inflammatory medications 3. Muscle relaxants 4. Membrane stabilizers 5. Opioids 6. Physical therapy and/or chiropractic manipulation 7. Modalities (Heat, ice, etc.) 8. Invasive techniques such as nerve blocks. Samuel [name] has attained more than 50% relief of the pain from a series of diagnostic injections conducted in separate occasions.  Pain Score: Pre-procedure: 5 /10 Post-procedure: 0-No pain/10     Position / Prep / Materials:  Position: Prone  Prep solution: DuraPrep (Iodine Povacrylex [0.7% available iodine] and Isopropyl Alcohol, 74% w/w) Prep Area: Entire Lumbosacral Region (Lower back from mid-thoracic region to end of tailbone and from flank to flank.) Materials:  Tray: RFA (Radiofrequency) tray Needle(s):  Type: RFA (Teflon-coated radiofrequency ablation needles) Gauge (G): 20  Length: Long (15cm) Qty: 5  Pre-op H&P Assessment:  Samuel [name] is a 46 y.o. (year old), male patient, seen today for interventional treatment. He  has a past surgical history that includes Back surgery. Samuel [name] has a current medication list which includes the following prescription(s): azelastine hcl, carvedilol, gabapentin, lactulose, morphine, multivitamin, oxycodone hcl, pantoprazole, and tizanidine, and the following Facility-Administered Medications: fentanyl. His primarily concern today is the Back Pain (Left lumbar )  Initial Vital Signs:  Pulse/HCG Rate: (!) 57ECG Heart Rate: (!) 56 (SB) Temp: (!) 97.3 F (36.3 C) Resp: 16 BP: (!) 116/59 SpO2: 99 %  BMI: Estimated body mass index is 34.83 kg/m as calculated from the following:   Height as of this encounter: '6\' 1"'$  (1.854 m).   Weight as of this encounter: 264 lb (119.7 kg).  Risk Assessment: Allergies: Reviewed. He has no allergies on file.  Allergy Precautions: None required Coagulopathies: Reviewed. None identified.  Blood-thinner therapy: None at this time Active Infection(s): Reviewed. None identified. Samuel [name] is  afebrile  Site Confirmation: Samuel [name]  was asked to confirm the procedure and laterality before marking the site Procedure checklist: Completed Consent: Before the procedure and under the influence of no sedative(s), amnesic(s), or anxiolytics, the patient was informed of the treatment options, risks and possible complications. To fulfill our ethical and legal obligations, as recommended by the American Medical Association's Code of Ethics, I have informed the patient of my clinical impression; the nature and purpose of the treatment or procedure; the risks, benefits, and possible complications of the intervention; the alternatives, including doing nothing; the risk(s) and benefit(s) of the alternative treatment(s) or procedure(s); and the risk(s) and benefit(s) of doing nothing. The patient was provided information about the general risks and possible complications associated with the procedure. These may include, but are not limited to: failure to achieve desired goals, infection, bleeding, organ or nerve damage, allergic reactions, paralysis, and death. In addition, the patient was informed of those risks and complications associated to Spine-related procedures, such as failure to decrease pain; infection (i.e.: Meningitis, epidural or intraspinal abscess); bleeding (i.e.: epidural hematoma, subarachnoid hemorrhage, or any other type of intraspinal or peri-dural bleeding); organ or nerve damage (i.e.: Any type of peripheral nerve, nerve root, or spinal cord injury) with subsequent damage to sensory, motor, and/or autonomic systems, resulting in permanent pain, numbness, and/or weakness of one or several areas of the body; allergic reactions; (i.e.: anaphylactic reaction); and/or death. Furthermore, the patient was informed of those risks and complications associated with the medications. These include, but are not limited to: allergic reactions (i.e.: anaphylactic or anaphylactoid reaction(s)); adrenal axis  suppression; blood sugar elevation that in diabetics may result in ketoacidosis or comma; water retention that in patients with history of congestive heart failure may result in shortness of breath, pulmonary edema, and decompensation with resultant heart failure; weight gain; swelling or edema; medication-induced neural toxicity; particulate matter embolism and blood vessel occlusion with resultant organ, and/or nervous system infarction; and/or aseptic necrosis of one or more joints. Finally, the patient was informed that Medicine is not an exact science; therefore, there is also the possibility of unforeseen or unpredictable risks and/or possible complications that may result in a catastrophic outcome. The patient indicated having understood very clearly. We have given the patient no guarantees and we have made no promises. Enough time was given to the patient to ask questions, all of which were answered to the patient's satisfaction. Samuel [name] has indicated that he wanted to continue with the procedure. Attestation: I, the ordering provider, attest that I have discussed with the patient the benefits, risks, side-effects, alternatives, likelihood of achieving goals, and potential problems during recovery for the procedure that I have provided informed consent. Date  Time: 04/21/2022  8:24 AM  Pre-Procedure Preparation:  Monitoring: As per clinic protocol. Respiration, ETCO2, SpO2, BP, heart rate and rhythm monitor placed and checked for adequate function Safety Precautions: Patient was assessed for positional comfort and pressure points before starting the procedure. Time-out: I initiated and conducted the "Time-out" before starting the procedure, as per protocol. The patient was asked to participate by confirming the accuracy of the "Time Out" information. Verification of the correct person, site, and procedure were performed and confirmed by me, the nursing staff, and the patient. "Time-out" conducted  as per Joint Commission's Universal Protocol (UP.01.01.01). Time: 0919  Description of Procedure:          Laterality: Left Levels:  L2, L3, L4, L5, & S1 Medial Branch Level(s). Safety Precautions: Aspiration looking for blood return was conducted prior  to all injections. At no point did we inject any substances, as a needle was being advanced. Before injecting, the patient was told to immediately notify me if he was experiencing any new onset of "ringing in the ears, or metallic taste in the mouth". No attempts were made at seeking any paresthesias. Safe injection practices and needle disposal techniques used. Medications properly checked for expiration dates. SDV (single dose vial) medications used. After the completion of the procedure, all disposable equipment used was discarded in the proper designated medical waste containers. Local Anesthesia: Protocol guidelines were followed. The patient was positioned over the fluoroscopy table. The area was prepped in the usual manner. The time-out was completed. The target area was identified using fluoroscopy. A 12-in long, straight, sterile hemostat was used with fluoroscopic guidance to locate the targets for each level blocked. Once located, the skin was marked with an approved surgical skin marker. Once all sites were marked, the skin (epidermis, dermis, and hypodermis), as well as deeper tissues (fat, connective tissue and muscle) were infiltrated with a small amount of a short-acting local anesthetic, loaded on a 10cc syringe with a 25G, 1.5-in  Needle. An appropriate amount of time was allowed for local anesthetics to take effect before proceeding to the next step. Technical description of process:  Radiofrequency Ablation (RFA) L2 Medial Branch Nerve RFA: The target area for the L2 medial branch is at the junction of the postero-lateral aspect of the superior articular process and the superior, posterior, and medial edge of the transverse process of  L3. Under fluoroscopic guidance, a Radiofrequency needle was inserted until contact was made with os over the superior postero-lateral aspect of the pedicular shadow (target area). Sensory and motor testing was conducted to properly adjust the position of the needle. Once satisfactory placement of the needle was achieved, the numbing solution was slowly injected after negative aspiration for blood. 2.0 mL of the nerve block solution was injected without difficulty or complication. After waiting for at least 3 minutes, the ablation was performed. Once completed, the needle was removed intact. L3 Medial Branch Nerve RFA: The target area for the L3 medial branch is at the junction of the postero-lateral aspect of the superior articular process and the superior, posterior, and medial edge of the transverse process of L4. Under fluoroscopic guidance, a Radiofrequency needle was inserted until contact was made with os over the superior postero-lateral aspect of the pedicular shadow (target area). Sensory and motor testing was conducted to properly adjust the position of the needle. Once satisfactory placement of the needle was achieved, the numbing solution was slowly injected after negative aspiration for blood. 2.0 mL of the nerve block solution was injected without difficulty or complication. After waiting for at least 3 minutes, the ablation was performed. Once completed, the needle was removed intact. L4 Medial Branch Nerve RFA: The target area for the L4 medial branch is at the junction of the postero-lateral aspect of the superior articular process and the superior, posterior, and medial edge of the transverse process of L5. Under fluoroscopic guidance, a Radiofrequency needle was inserted until contact was made with os over the superior postero-lateral aspect of the pedicular shadow (target area). Sensory and motor testing was conducted to properly adjust the position of the needle. Once satisfactory placement  of the needle was achieved, the numbing solution was slowly injected after negative aspiration for blood. 2.0 mL of the nerve block solution was injected without difficulty or complication. After waiting for at least 3  minutes, the ablation was performed. Once completed, the needle was removed intact. L5 Medial Branch Nerve RFA: The target area for the L5 medial branch is at the junction of the postero-lateral aspect of the superior articular process of S1 and the superior, posterior, and medial edge of the sacral ala. Under fluoroscopic guidance, a Radiofrequency needle was inserted until contact was made with os over the superior postero-lateral aspect of the pedicular shadow (target area). Sensory and motor testing was conducted to properly adjust the position of the needle. Once satisfactory placement of the needle was achieved, the numbing solution was slowly injected after negative aspiration for blood. 2.0 mL of the nerve block solution was injected without difficulty or complication. After waiting for at least 3 minutes, the ablation was performed. Once completed, the needle was removed intact. S1 Medial Branch Nerve RFA: The target area for the S1 medial branch is located inferior to the junction of the S1 superior articular process and the L5 inferior articular process, posterior, inferior, and lateral to the 6 o'clock position of the L5-S1 facet joint, just superior to the S1 posterior foramen. Under fluoroscopic guidance, the Radiofrequency needle was advanced until contact was made with os over the Target area. Sensory and motor testing was conducted to properly adjust the position of the needle. Once satisfactory placement of the needle was achieved, the numbing solution was slowly injected after negative aspiration for blood. 2.0 mL of the nerve block solution was injected without difficulty or complication. After waiting for at least 3 minutes, the ablation was performed. Once completed, the needle  was removed intact. Radiofrequency lesioning (ablation):  Radiofrequency Generator: Medtronic AccurianTM AG 1000 RF Generator Sensory Stimulation Parameters: 50 Hz was used to locate & identify the nerve, making sure that the needle was positioned such that there was no sensory stimulation below 0.3 V or above 0.7 V. Motor Stimulation Parameters: 2 Hz was used to evaluate the motor component. Care was taken not to lesion any nerves that demonstrated motor stimulation of the lower extremities at an output of less than 2.5 times that of the sensory threshold, or a maximum of 2.0 V. Lesioning Technique Parameters: Standard Radiofrequency settings. (Not bipolar or pulsed.) Temperature Settings: 80 degrees C Lesioning time: 60 seconds Intra-operative Compliance: Compliant  Once the entire procedure was completed, the treated area was cleaned, making sure to leave some of the prepping solution back to take advantage of its long term bactericidal properties.    Illustration of the posterior view of the lumbar spine and the posterior neural structures. Laminae of L2 through S1 are labeled. DPRL5, dorsal primary ramus of L5; DPRS1, dorsal primary ramus of S1; DPR3, dorsal primary ramus of L3; FJ, facet (zygapophyseal) joint L3-L4; I, inferior articular process of L4; LB1, lateral branch of dorsal primary ramus of L1; IAB, inferior articular branches from L3 medial branch (supplies L4-L5 facet joint); IBP, intermediate branch plexus; MB3, medial branch of dorsal primary ramus of L3; NR3, third lumbar nerve root; S, superior articular process of L5; SAB, superior articular branches from L4 (supplies L4-5 facet joint also); TP3, transverse process of L3.  Vitals:   04/21/22 0955 04/21/22 1005 04/21/22 1016 04/21/22 1025  BP: (!) 112/57 109/82 113/60 (!) 121/57  Pulse:      Resp: 14 18    Temp:  (!) 97 F (36.1 C)    TempSrc:      SpO2: 100% 100% 100% 100%  Weight:      Height:  Start Time: 0919  hrs. End Time: 0955 hrs.  Imaging Guidance (Spinal):          Type of Imaging Technique: Fluoroscopy Guidance (Spinal) Indication(s): Assistance in needle guidance and placement for procedures requiring needle placement in or near specific anatomical locations not easily accessible without such assistance. Exposure Time: Please see nurses notes. Contrast: None used. Fluoroscopic Guidance: I was personally present during the use of fluoroscopy. "Tunnel Vision Technique" used to obtain the best possible view of the target area. Parallax error corrected before commencing the procedure. "Direction-depth-direction" technique used to introduce the needle under continuous pulsed fluoroscopy. Once target was reached, antero-posterior, oblique, and lateral fluoroscopic projection used confirm needle placement in all planes. Images permanently stored in EMR. Interpretation: No contrast injected. I personally interpreted the imaging intraoperatively. Adequate needle placement confirmed in multiple planes. Permanent images saved into the patient's record.  Antibiotic Prophylaxis:   Anti-infectives (From admission, onward)    None      Indication(s): None identified  Post-operative Assessment:  Post-procedure Vital Signs:  Pulse/HCG Rate: (!) 57(!) 55 Temp: (!) 97 F (36.1 C) Resp: 18 BP: (!) 121/57 SpO2: 100 %  EBL: None  Complications: No immediate post-treatment complications observed by team, or reported by patient.  Note: The patient tolerated the entire procedure well. A repeat set of vitals were taken after the procedure and the patient was kept under observation following institutional policy, for this type of procedure. Post-procedural neurological assessment was performed, showing return to baseline, prior to discharge. The patient was provided with post-procedure discharge instructions, including a section on how to identify potential problems. Should any problems arise concerning  this procedure, the patient was given instructions to immediately contact us, at any time, without hesitation. In any case, we plan to contact the patient by telephone for a follow-up status report regarding this interventional procedure.  Comments:  No additional relevant information.  Plan of Care  Orders:  Orders Placed This Encounter  Procedures   Radiofrequency,Lumbar    Scheduling Instructions:     Side(s): Left-sided     Level: L3-4, L4-5, & L5-S1 Facets (L2, L3, L4, L5, & S1 Medial Branch Nerves)     Sedation: With Sedation.     Timeframe: Today    Order Specific Question:   Where will this procedure be performed?    Answer:   [facility]   DG [facility] C-ARM 1-60 MIN NO REPORT    Intraoperative interpretation by procedural physician at [place].    Standing Status:   Standing    Number of Occurrences:   1    Order Specific Question:   Reason for exam:    Answer:   Assistance in needle guidance and placement for procedures requiring needle placement in or near specific anatomical locations not easily accessible without such assistance.   Informed Consent Details: Physician/Practitioner Attestation; Transcribe to consent form and obtain patient signature    Nursing Order: Transcribe to consent form and obtain patient signature. Note: Always confirm laterality of pain with Samuel [name], before procedure.    Order Specific Question:   Physician/Practitioner attestation of informed consent for procedure/surgical case    Answer:   I, the physician/practitioner, attest that I have discussed with the patient the benefits, risks, side effects, alternatives, likelihood of achieving goals and potential problems during recovery for the procedure that I have provided informed consent.    Order Specific Question:   Procedure    Answer:   Lumbar  Facet Radiofrequency Ablation    Order Specific Question:   Physician/Practitioner performing the procedure    Answer:    Kaladin Noseworthy A. Dossie Arbour, MD    Order Specific Question:   Indication/Reason    Answer:   Low Back Pain, with our without leg pain, due to Facet Joint Arthralgia (Joint Pain) known as Lumbar Facet Syndrome, secondary to Lumbar, and/or Lumbosacral Spondylosis (Arthritis of the Spine), without myelopathy or radiculopathy (Nerve Damage).   Provide equipment / supplies at bedside    "Radiofrequency Tray"; Large hemostat (x1); Small hemostat (x1); Towels (x8); 4x4 sterile sponge pack (x1) Needle type: Teflon-coated Radiofrequency Needle (Disposable  single use) Size: Long Quantity: 5    Standing Status:   Standing    Number of Occurrences:   1    Order Specific Question:   Specify    Answer:   Radiofrequency Tray   Chronic Opioid Analgesic:  No chronic opioid analgesics therapy prescribed by our practice.  MS Contin 30 mg every 12 hours (# 60) Marcello Moores, Vanessa Ralphs, NP/Andrew Derl Barrow, MD); oxycodone IR 10 mg, 1 tab p.o. 4 times daily (# 120) Marcello Moores, Vanessa Ralphs, NP/Andrew Derl Barrow, MD)   Medications ordered for procedure: Meds ordered this encounter  Medications   lidocaine (XYLOCAINE) 2 % (with pres) injection 400 mg   pentafluoroprop-tetrafluoroeth (GEBAUERS) aerosol   lactated ringers infusion   midazolam (VERSED) 5 MG/5ML injection 0.5-2 mg    Make sure Flumazenil is available in the pyxis when using this medication. If oversedation occurs, administer 0.2 mg IV over 15 sec. If after 45 sec no response, administer 0.2 mg again over 1 min; may repeat at 1 min intervals; not to exceed 4 doses (1 mg)   fentaNYL (SUBLIMAZE) injection 25-50 mcg    Make sure Narcan is available in the pyxis when using this medication. In the event of respiratory depression (RR< 8/min): Titrate NARCAN (naloxone) in increments of 0.1 to 0.2 mg IV at 2-3 minute intervals, until desired degree of reversal.   ropivacaine (PF) 2 mg/mL (0.2%) (NAROPIN) injection 9 mL   triamcinolone acetonide (KENALOG-40)  injection 40 mg   Medications administered: We administered lidocaine, pentafluoroprop-tetrafluoroeth, lactated ringers, midazolam, fentaNYL, ropivacaine (PF) 2 mg/mL (0.2%), and triamcinolone acetonide.  See the medical record for exact dosing, route, and time of administration.  Follow-up plan:   Return in about 2 weeks (around 05/05/2022) for (33mn), (ECT): (R) L-FCT RFA #4.       Interventional Therapies  Risk  Complexity Considerations:   Estimated body mass index is 34.3 kg/m as calculated from the following:   Height as of this encounter: '6\' 1"'$  (1.854 m).   Weight as of this encounter: 260 lb (117.9 kg).  Risks: Thrombocytopenia    Planned  Pending:   Palliative right lumbar facet RFA #4 (next)  Therapeutic left lumbar facet RFA #4 (04/21/2022)    Under consideration:      Completed:   Therapeutic left lumbar facet (MB L2-S1) RFA #3 (04/01/2016, 04/05/2018, 10/24/2019) (53/70/70/50/50)  Therapeutic right lumbar facet (MB L2-S1) RFA #3 (12/24/2015, 02/17/2018, 11/16/2019) (50/80/80/40/50)    Completed by other providers:   N/a   Therapeutic  Palliative (PRN) options:   None established   Pharmacological Recommendations:   Medication management by AAlysia Penna MD    Recent Visits Date Type Provider Dept  04/06/22 Office Visit NMilinda Pointer MD [facility]  Showing recent visits within past 90 days and meeting all other requirements Today's Visits Date Type Provider Dept  04/21/22 Procedure visit Milinda Pointer, MD [facility]  Showing today's visits and meeting all other requirements Future Appointments Date Type Provider Dept  05/07/22 Appointment Milinda Pointer, MD [facility]  Showing future appointments within next 90 days and meeting all other requirements  Disposition: Discharge home  Discharge (Date  Time): 04/21/2022; 1025 hrs.   Primary Care Physician: Annye English Location: [facility] Note by: Gaspar Cola, MD Date: 04/21/2022; Time: 10:45 AM  Disclaimer:  Medicine is not an Chief Strategy Officer. The only guarantee in medicine is that nothing is guaranteed. It is important to note that the decision to proceed with this intervention was based on the information collected from the patient. The Data and conclusions were drawn from the patient's questionnaire, the interview, and the physical examination. Because the information was provided in large part by the patient, it cannot be guaranteed that it has not been purposely or unconsciously manipulated. Every effort has been made to obtain as much relevant data as possible for this evaluation. It is important to note that the conclusions that lead to this procedure are derived in large part from the available data. Always take into account that the treatment will also be dependent on availability of resources and existing treatment guidelines, considered by other Pain Management Practitioners as being common knowledge and practice, at the time of the intervention. For Medico-Legal purposes, it is also important to point out that variation in procedural techniques and pharmacological choices are the acceptable norm. The indications, contraindications, technique, and results of the above procedure should only be interpreted and judged by a Board-Certified Interventional Pain Specialist with extensive familiarity and expertise in the same exact procedure and technique.

## 2022-04-21 ENCOUNTER — Ambulatory Visit
Admission: RE | Admit: 2022-04-21 | Discharge: 2022-04-21 | Disposition: A | Discharge status: Home / Self Care | Payer: PPO | Source: Ambulatory Visit | Attending: Pain Medicine | Admitting: Pain Medicine

## 2022-04-21 ENCOUNTER — Ambulatory Visit: Payer: PPO | Attending: Pain Medicine | Admitting: Pain Medicine

## 2022-04-21 ENCOUNTER — Encounter: Payer: Self-pay | Admitting: Pain Medicine

## 2022-04-21 VITALS — BP 121/57 | HR 57 | Temp 97.0°F | Resp 18 | Ht 73.0 in | Wt 264.0 lb

## 2022-04-21 DIAGNOSIS — M5136 Other intervertebral disc degeneration, lumbar region: Secondary | ICD-10-CM | POA: Insufficient documentation

## 2022-04-21 DIAGNOSIS — G8929 Other chronic pain: Secondary | ICD-10-CM | POA: Insufficient documentation

## 2022-04-21 DIAGNOSIS — M47816 Spondylosis without myelopathy or radiculopathy, lumbar region: Secondary | ICD-10-CM | POA: Diagnosis not present

## 2022-04-21 DIAGNOSIS — M961 Postlaminectomy syndrome, not elsewhere classified: Secondary | ICD-10-CM | POA: Insufficient documentation

## 2022-04-21 DIAGNOSIS — M47817 Spondylosis without myelopathy or radiculopathy, lumbosacral region: Secondary | ICD-10-CM | POA: Diagnosis not present

## 2022-04-21 DIAGNOSIS — M545 Low back pain, unspecified: Secondary | ICD-10-CM | POA: Insufficient documentation

## 2022-04-21 MED ORDER — TRIAMCINOLONE ACETONIDE 40 MG/ML IJ SUSP
INTRAMUSCULAR | Status: AC
  Filled 2022-04-21: qty 1

## 2022-04-21 MED ORDER — FENTANYL CITRATE (PF) 100 MCG/2ML IJ SOLN
25.0000 ug | INTRAMUSCULAR | Status: DC | PRN
  Administered 2022-04-21: 100 ug via INTRAVENOUS

## 2022-04-21 MED ORDER — MIDAZOLAM HCL 5 MG/5ML IJ SOLN
INTRAMUSCULAR | Status: AC
  Filled 2022-04-21: qty 5

## 2022-04-21 MED ORDER — LIDOCAINE HCL (PF) 2 % IJ SOLN
INTRAMUSCULAR | Status: AC
  Filled 2022-04-21: qty 5

## 2022-04-21 MED ORDER — TRIAMCINOLONE ACETONIDE 40 MG/ML IJ SUSP
40.0000 mg | Freq: Once | INTRAMUSCULAR | Status: AC
  Administered 2022-04-21: 40 mg

## 2022-04-21 MED ORDER — LIDOCAINE HCL 2 % IJ SOLN
20.0000 mL | Freq: Once | INTRAMUSCULAR | Status: AC
  Administered 2022-04-21: 400 mg

## 2022-04-21 MED ORDER — LACTATED RINGERS IV SOLN: Freq: Once | INTRAVENOUS | Status: AC

## 2022-04-21 MED ORDER — MIDAZOLAM HCL 5 MG/5ML IJ SOLN
0.5000 mg | Freq: Once | INTRAMUSCULAR | Status: AC
  Administered 2022-04-21: 3 mg via INTRAVENOUS

## 2022-04-21 MED ORDER — PENTAFLUOROPROP-TETRAFLUOROETH EX AERO
INHALATION_SPRAY | Freq: Once | CUTANEOUS | Status: AC
  Administered 2022-04-21: 30 via TOPICAL
  Filled 2022-04-21: qty 116

## 2022-04-21 MED ORDER — ROPIVACAINE HCL 2 MG/ML IJ SOLN
9.0000 mL | Freq: Once | INTRAMUSCULAR | Status: AC
  Administered 2022-04-21: 9 mL via PERINEURAL

## 2022-04-21 MED ORDER — FENTANYL CITRATE (PF) 100 MCG/2ML IJ SOLN
INTRAMUSCULAR | Status: AC
  Filled 2022-04-21: qty 2

## 2022-04-21 NOTE — Patient Instructions (Addendum)
Post-procedure Information What to expect: Most procedures involve the use of a local anesthetic (numbing medicine), and a steroid (anti-inflammatory medicine).  The local anesthetics may cause temporary numbness and weakness of the legs or arms, depending on the location of the block. This numbness/weakness may last 4-6 hours, depending on the local anesthetic used. In rare instances, it can last up to 24 hours. While numb, you must be very careful not to injure the extremity.  After any procedure, you could expect the pain to get better within 15-20 minutes. This relief is temporary and may last 4-6 hours. Once the local anesthetics wears off, you could experience discomfort, possibly more than usual, for up to 10 (ten) days. In the case of radiofrequencies, it may last up to 6 weeks. Surgeries may take up to 8 weeks for the healing process. The discomfort is due to the irritation caused by needles going through skin and muscle. To minimize the discomfort, we recommend using ice the first day, and heat from then on. The ice should be applied for 15 minutes on, and 15 minutes off. Keep repeating this cycle until bedtime. Avoid applying the ice directly to the skin, to prevent frostbite. Heat should be used daily, until the pain improves (4-10 days). Be careful not to burn yourself.  Occasionally you may experience muscle spasms or cramps. These occur as a consequence of the irritation caused by the needle sticks to the muscle and the blood that will inevitably be lost into the surrounding muscle tissue. Blood tends to be very irritating to tissues, which tend to react by going into spasm. These spasms may start the same day of your procedure, but they may also take days to develop. This late onset type of spasm or cramp is usually caused by electrolyte imbalances triggered by the steroids, at the level of the kidney. Cramps and spasms tend to respond well to muscle relaxants, multivitamins (some are  triggered by the procedure, but may have their origins in vitamin deficiencies), and "Gatorade", or any sports drinks that can replenish any electrolyte imbalances. (If you are a diabetic, ask your pharmacist to get you a sugar-free brand.) Warm showers or baths may also be helpful. Stretching exercises are highly recommended. General Instructions:  Be alert for signs of possible infection: redness, swelling, heat, red streaks, elevated temperature, and/or fever. These typically appear 4 to 6 days after the procedure. Immediately notify your doctor if you experience unusual bleeding, difficulty breathing, or loss of bowel or bladder control. If you experience increased pain, do not increase your pain medicine intake, unless instructed by your pain physician. Post-Procedure Care:  Be careful in moving about. Muscle spasms in the area of the injection may occur. Applying ice or heat to the area is often helpful. The incidence of spinal headaches after epidural injections ranges between 1.4% and 6%. If you develop a headache that does not seem to respond to conservative therapy, please let your physician know. This can be treated with an epidural blood patch.   Post-procedure numbness or redness is to be expected, however it should average 4 to 6 hours. If numbness and weakness of your extremities begins to develop 4 to 6 hours after your procedure, and is felt to be progressing and worsening, immediately contact your physician.   Diet:  If you experience nausea, do not eat until this sensation goes away. If you had a "Stellate Ganglion Block" for upper extremity "Reflex Sympathetic Dystrophy", do not eat or drink until your   hoarseness goes away. In any case, always start with liquids first and if you tolerate them well, then slowly progress to more solid foods. Activity:  For the first 4 to 6 hours after the procedure, use caution in moving about as you may experience numbness and/or weakness. Use caution in  cooking, using household electrical appliances, and climbing steps. If you need to reach your Doctor call our office: [phone] Monday-Thursday 8:00 am - 4:00 PM    Fridays: Closed     In case of an emergency: In case of emergency, call 911 or go to the nearest emergency room and have the physician there call us.  Interpretation of Procedure Every nerve block has two components: a diagnostic component, and a treatment component. Unrealistic expectations are the most common causes of "perceived failure".  In a perfect world, a single nerve block should be able to completely and permanently eliminate the pain. Sadly, the world is not perfect.  Most pain management nerve blocks are performed using local anesthetics and steroids. Steroids are responsible for any long-term benefit that you may experience. Their purpose is to decrease any chronic swelling that may exist in the area. Steroids begin to work immediately after being injected. However, most patients will not experience any benefits until 5 to 10 days after the injection, when the swelling has come down to the point where they can tell a difference. Steroids will only help if there is swelling to be treated. As such, they can assist with the diagnosis. If effective, they suggest an inflammatory component to the pain, and if ineffective, they rule out inflammation as the main cause or component of the problem. If the problem is one of mechanical compression, you will get no benefit from those steroids.   In the case of local anesthetics, they have a crucial role in the diagnosis of your condition. Most will begin to work within15 to 20 minutes after injection. The duration will depend on the type used (short- vs. Long-acting). It is of outmost importance that patients keep tract of their pain, after the procedure. To assist with this matter, a "Post-procedure Pain Diary" is provided. Make sure to complete it and to bring it back to your  follow-up appointment.  As long as the patient keeps accurate, detailed records of their symptoms after every procedure, and returns to have those interpreted, every procedure will provide us with invaluable information. Even a block that does not provide the patient with any relief, will always provide us with information about the mechanism and the origin of the pain. The only time a nerve block can be considered a waste of time is when patients do not keep track of the results, or do not keep their post-procedure appointment.  Reporting the results back to your physician The Pain Score  Pain is a subjective complaint. It cannot be seen, touched, or measured. We depend entirely on the patient's report of the pain in order to assess your condition and treatment. To evaluate the pain, we use a pain scale, where "0" means "No Pain", and a "10" is "the worst possible pain that you can even imagine" (i.e. something like been eaten alive by a shark or being torn apart by a lion).   You will frequently be asked to rate your pain. Please be as accurate, remember that medical decisions will be based on your responses. Please do not rate your pain above a 10. Doing so is actually interpreted as "symptom magnification" (exaggeration), as   well as lack of understanding with regards to the scale. To put this into perspective, when you tell us that your pain is at a 10 (ten), what you are saying is that there is nothing we can do to make this pain any worse. (Carefully think about that.) Preparing for Procedure with Sedation Instructions: Oral Intake: Do not eat or drink anything for at least 8 hours prior to your procedure. Transportation: Public transportation is not allowed. Bring an adult driver. The driver must be physically present in our waiting room before any procedure can be started. Physical Assistance: Bring an adult capable of physically assisting you, in the event you need help. Blood Pressure  Medicine: Take your blood pressure medicine with a sip of water the morning of the procedure. Insulin: Take only  of your normal insulin dose. Preventing infections: Shower with an antibacterial soap the morning of your procedure. Build-up your immune system: Take 1000 mg of Vitamin C with every meal (3 times a day) the day prior to your procedure. Pregnancy: If you are pregnant, call and cancel the procedure. Sickness: If you have a cold, fever, or any active infections, call and cancel the procedure. Arrival: You must be in the facility at least 30 minutes prior to your scheduled procedure. Children: Do not bring children with you. Dress appropriately: Bring dark clothing that you would not mind if they get stained. Valuables: Do not bring any jewelry or valuables. Procedure appointments are reserved for interventional treatments only. No Prescription Refills. No medication changes will be discussed during procedure appointments. No disability issues will be discussed.  ___________________________________________________________________________________________  Post-Radiofrequency (RF) Discharge Instructions  You have just completed a Radiofrequency Neurotomy.  The following instructions will provide you with information and guidelines for self-care upon discharge.  If at any time you have questions or concerns please call your physician. DO NOT DRIVE YOURSELF!!  Instructions: Apply ice: Fill a plastic sandwich bag with crushed ice. Cover it with a small towel and apply to injection site. Apply for 15 minutes then remove x 15 minutes. Repeat sequence on day of procedure, until you go to bed. The purpose is to minimize swelling and discomfort after procedure. Apply heat: Apply heat to procedure site starting the day following the procedure. The purpose is to treat any soreness and discomfort from the procedure. Food intake: No eating limitations, unless stipulated above.  Nevertheless, if  you have had sedation, you may experience some nausea.  In this case, it may be wise to wait at least two hours prior to resuming regular diet. Physical activities: Keep activities to a minimum for the first 8 hours after the procedure. For the first 24 hours after the procedure, do not drive a motor vehicle,  Operate heavy machinery, power tools, or handle any weapons.  Consider walking with the use of an assistive device or accompanied by an adult for the first 24 hours.  Do not drink alcoholic beverages including beer.  Do not make any important decisions or sign any legal documents. Go home and rest today.  Resume activities tomorrow, as tolerated.  Use caution in moving about as you may experience mild leg weakness.  Use caution in cooking, use of household electrical appliances and climbing steps. Driving: If you have received any sedation, you are not allowed to drive for 24 hours after your procedure. Blood thinner: Restart your blood thinner 6 hours after your procedure. (Only for those taking blood thinners) Insulin: As soon as you can eat, you  may resume your normal dosing schedule. (Only for those taking insulin) Medications: May resume pre-procedure medications.  Do not take any drugs, other than what has been prescribed to you. Infection prevention: Keep procedure site clean and dry. Post-procedure Pain Diary: Extremely important that this be done correctly and accurately. Recorded information will be used to determine the next step in treatment. Pain evaluated is that of treated area only. Do not include pain from an untreated area. Complete every hour, on the hour, for the initial 8 hours. Set an alarm to help you do this part accurately. Do not go to sleep and have it completed later. It will not be accurate. Follow-up appointment: Keep your follow-up appointment after the procedure. Usually 2-6 weeks after radiofrequency. Bring you pain diary. The information collected will be essential  for your long-term care.   Expect: From numbing medicine (AKA: Local Anesthetics): Numbness or decrease in pain. Onset: Full effect within 15 minutes of injected. Duration: It will depend on the type of local anesthetic used. On the average, 1 to 8 hours.  From steroids (when added): Decrease in swelling or inflammation. Once inflammation is improved, relief of the pain will follow. Onset of benefits: Depends on the amount of swelling present. The more swelling, the longer it will take for the benefits to be seen. In some cases, up to 10 days. Duration: Steroids will stay in the system x 2 weeks. Duration of benefits will depend on multiple posibilities including persistent irritating factors. From procedure: Some discomfort is to be expected once the numbing medicine wears off. In the case of radiofrequency procedures, this may last as long as 6 weeks. Additional post-procedure pain medication is provided for this. Discomfort is minimized if ice and heat are applied as instructed.  Call if: You experience numbness and weakness that gets worse with time, as opposed to wearing off. He experience any unusual bleeding, difficulty breathing, or loss of the ability to control your bowel and bladder. (This applies to Spinal procedures only) You experience any redness, swelling, heat, red streaks, elevated temperature, fever, or any other signs of a possible infection.  Emergency Numbers: [place] hours (Monday - Thursday, 8:00 AM - 4:00 PM) (Friday, 9:00 AM - 12:00 Noon): [phone] After hours: [phone] ____________________________________________________________________________________________    ______________________________________________________________________  Preparing for your procedure  During your procedure appointment there will be: No Prescription Refills. No disability issues to discussed. No medication changes or discussions.  Instructions: Food intake:  Avoid eating anything solid for at least 8 hours prior to your procedure. Clear liquid intake: You may take clear liquids such as water up to 2 hours prior to your procedure. (No carbonated drinks. No soda.) Transportation: Unless otherwise stated by your physician, bring a driver. Morning Medicines: Except for blood thinners, take all of your other morning medications with a sip of water. Make sure to take your heart and blood pressure medicines. If your blood pressure's lower number is above 100, the case will be rescheduled. Blood thinners: If you take a blood thinner, but were not instructed to stop it, call our office [phone] and ask to talk to a nurse. Not stopping a blood thinner prior to certain procedures could lead to serious complications. Diabetics on insulin: Notify the staff so that you can be scheduled 1st case in the morning. If your diabetes requires high dose insulin, take only  of your normal insulin dose the morning of the procedure and notify the staff that you have  done so. Preventing infections: Shower with an antibacterial soap the morning of your procedure.  Build-up your immune system: Take 1000 mg of Vitamin C with every meal (3 times a day) the day prior to your procedure. Antibiotics: Inform the nursing staff if you are taking any antibiotics or if you have any conditions that may require antibiotics prior to procedures. (Example: recent joint implants)   Pregnancy: If you are pregnant make sure to notify the nursing staff. Not doing so may result in injury to the fetus, including death.  Sickness: If you have a cold, fever, or any active infections, call and cancel or reschedule your procedure. Receiving steroids while having an infection may result in complications. Arrival: You must be in the facility at least 30 minutes prior to your scheduled procedure. Tardiness: Your scheduled time is also the cutoff time. If you do not arrive at least 15 minutes prior to your  procedure, you will be rescheduled.  Children: Do not bring any children with you. Make arrangements to keep them home. Dress appropriately: There is always a possibility that your clothing may get soiled. Avoid long dresses. Valuables: Do not bring any jewelry or valuables.  Reasons to call and reschedule or cancel your procedure: (Following these recommendations will minimize the risk of a serious complication.) Surgeries: Avoid having procedures within 2 weeks of any surgery. (Avoid for 2 weeks before or after any surgery). Flu Shots: Avoid having procedures within 2 weeks of a flu shots or . (Avoid for 2 weeks before or after immunizations). Barium: Avoid having a procedure within 7-10 days after having had a radiological study involving the use of radiological contrast. (Myelograms, Barium swallow or enema study). Heart attacks: Avoid any elective procedures or surgeries for the initial 6 months after a "Myocardial Infarction" (Heart Attack). Blood thinners: It is imperative that you stop these medications before procedures. Let us know if you if you take any blood thinner.  Infection: Avoid procedures during or within two weeks of an infection (including chest colds or gastrointestinal problems). Symptoms associated with infections include: Localized redness, fever, chills, night sweats or profuse sweating, burning sensation when voiding, cough, congestion, stuffiness, runny nose, sore throat, diarrhea, nausea, vomiting, cold or Flu symptoms, recent or current infections. It is specially important if the infection is over the area that we intend to treat. Heart and lung problems: Symptoms that may suggest an active cardiopulmonary problem include: cough, chest pain, breathing difficulties or shortness of breath, dizziness, ankle swelling, uncontrolled high or unusually low blood pressure, and/or palpitations. If you are experiencing any of these symptoms, cancel your procedure and contact your  primary care physician for an evaluation.  Remember:  Regular Business hours are:  Monday to Thursday 8:00 AM to 4:00 PM  Provider's Schedule: Milinda Pointer, MD:  Procedure days: Tuesday and Thursday 7:30 AM to 4:00 PM  Gillis Santa, MD:  Procedure days: Monday and Wednesday 7:30 AM to 4:00 PM  ______________________________________________________________________    ____________________________________________________________________________________________  General Risks and Possible Complications  Patient Responsibilities: It is important that you read this as it is part of your informed consent. It is our duty to inform you of the risks and possible complications associated with treatments offered to you. It is your responsibility as a patient to read this and to ask questions about anything that is not clear or that you believe was not covered in this document.  Patient's Rights: You have the right to refuse treatment. You also have the right  to change your mind, even after initially having agreed to have the treatment done. However, under this last option, if you wait until the last second to change your mind, you may be charged for the materials used up to that point.  Introduction: Medicine is not an Chief Strategy Officer. Everything in Medicine, including the lack of treatment(s), carries the potential for danger, harm, or loss (which is by definition: Risk). In Medicine, a complication is a secondary problem, condition, or disease that can aggravate an already existing one. All treatments carry the risk of possible complications. The fact that a side effects or complications occurs, does not imply that the treatment was conducted incorrectly. It must be clearly understood that these can happen even when everything is done following the highest safety standards.  No treatment: You can choose not to proceed with the proposed treatment alternative. The "PRO(s)" would include: avoiding  the risk of complications associated with the therapy. The "CON(s)" would include: not getting any of the treatment benefits. These benefits fall under one of three categories: diagnostic; therapeutic; and/or palliative. Diagnostic benefits include: getting information which can ultimately lead to improvement of the disease or symptom(s). Therapeutic benefits are those associated with the successful treatment of the disease. Finally, palliative benefits are those related to the decrease of the primary symptoms, without necessarily curing the condition (example: decreasing the pain from a flare-up of a chronic condition, such as incurable terminal cancer).  General Risks and Complications: These are associated to most interventional treatments. They can occur alone, or in combination. They fall under one of the following six (6) categories: no benefit or worsening of symptoms; bleeding; infection; nerve damage; allergic reactions; and/or death. No benefits or worsening of symptoms: In Medicine there are no guarantees, only probabilities. No healthcare provider can ever guarantee that a medical treatment will work, they can only state the probability that it may. Furthermore, there is always the possibility that the condition may worsen, either directly, or indirectly, as a consequence of the treatment. Bleeding: This is more common if the patient is taking a blood thinner, either prescription or over the counter (example: Goody Powders, Fish oil, Aspirin, Garlic, etc.), or if suffering a condition associated with impaired coagulation (example: Hemophilia, cirrhosis of the liver, low platelet counts, etc.). However, even if you do not have one on these, it can still happen. If you have any of these conditions, or take one of these drugs, make sure to notify your treating physician. Infection: This is more common in patients with a compromised immune system, either due to disease (example: diabetes, cancer, human  immunodeficiency virus [HIV], etc.), or due to medications or treatments (example: therapies used to treat cancer and rheumatological diseases). However, even if you do not have one on these, it can still happen. If you have any of these conditions, or take one of these drugs, make sure to notify your treating physician. Nerve Damage: This is more common when the treatment is an invasive one, but it can also happen with the use of medications, such as those used in the treatment of cancer. The damage can occur to small secondary nerves, or to large primary ones, such as those in the spinal cord and brain. This damage may be temporary or permanent and it may lead to impairments that can range from temporary numbness to permanent paralysis and/or brain death. Allergic Reactions: Any time a substance or material comes in contact with our body, there is the possibility of an allergic  reaction. These can range from a mild skin rash (contact dermatitis) to a severe systemic reaction (anaphylactic reaction), which can result in death. Death: In general, any medical intervention can result in death, most of the time due to an unforeseen complication. ____________________________________________________________________________________________

## 2022-04-21 NOTE — Progress Notes (Signed)
Safety precautions to be maintained throughout the outpatient stay will include: orient to surroundings, keep bed in low position, maintain call bell within reach at all times, provide assistance with transfer out of bed and ambulation.  

## 2022-04-22 ENCOUNTER — Telehealth: Payer: Self-pay

## 2022-04-22 NOTE — Telephone Encounter (Signed)
Post procedure follow up.  Patient states he is doing well 

## 2022-04-23 ENCOUNTER — Encounter: Payer: Self-pay | Admitting: Pain Medicine

## 2022-04-23 DIAGNOSIS — Z967 Presence of other bone and tendon implants: Secondary | ICD-10-CM | POA: Insufficient documentation

## 2022-04-23 DIAGNOSIS — R937 Abnormal findings on diagnostic imaging of other parts of musculoskeletal system: Secondary | ICD-10-CM | POA: Insufficient documentation

## 2022-04-27 ENCOUNTER — Other Ambulatory Visit: Payer: Self-pay | Admitting: Registered Nurse

## 2022-05-07 ENCOUNTER — Ambulatory Visit: Payer: PPO | Attending: Pain Medicine | Admitting: Pain Medicine

## 2022-05-07 ENCOUNTER — Encounter: Payer: Self-pay | Admitting: Pain Medicine

## 2022-05-07 ENCOUNTER — Ambulatory Visit
Admission: RE | Admit: 2022-05-07 | Discharge: 2022-05-07 | Disposition: A | Discharge status: Home / Self Care | Payer: PPO | Source: Ambulatory Visit | Attending: Pain Medicine | Admitting: Pain Medicine

## 2022-05-07 VITALS — BP 131/61 | HR 53 | Temp 97.2°F | Resp 16 | Ht 73.0 in | Wt 260.0 lb

## 2022-05-07 DIAGNOSIS — M47817 Spondylosis without myelopathy or radiculopathy, lumbosacral region: Secondary | ICD-10-CM | POA: Insufficient documentation

## 2022-05-07 DIAGNOSIS — M961 Postlaminectomy syndrome, not elsewhere classified: Secondary | ICD-10-CM | POA: Diagnosis not present

## 2022-05-07 DIAGNOSIS — G8929 Other chronic pain: Secondary | ICD-10-CM | POA: Diagnosis not present

## 2022-05-07 DIAGNOSIS — M5136 Other intervertebral disc degeneration, lumbar region: Secondary | ICD-10-CM | POA: Diagnosis not present

## 2022-05-07 DIAGNOSIS — M47816 Spondylosis without myelopathy or radiculopathy, lumbar region: Secondary | ICD-10-CM

## 2022-05-07 DIAGNOSIS — M545 Low back pain, unspecified: Secondary | ICD-10-CM | POA: Insufficient documentation

## 2022-05-07 MED ORDER — MIDAZOLAM HCL 5 MG/5ML IJ SOLN
0.5000 mg | Freq: Once | INTRAMUSCULAR | Status: AC
  Administered 2022-05-07: 3 mg via INTRAVENOUS

## 2022-05-07 MED ORDER — FENTANYL CITRATE (PF) 100 MCG/2ML IJ SOLN
25.0000 ug | INTRAMUSCULAR | Status: DC | PRN
  Administered 2022-05-07: 100 ug via INTRAVENOUS

## 2022-05-07 MED ORDER — LACTATED RINGERS IV SOLN: Freq: Once | INTRAVENOUS | Status: AC

## 2022-05-07 MED ORDER — ROPIVACAINE HCL 2 MG/ML IJ SOLN
9.0000 mL | Freq: Once | INTRAMUSCULAR | Status: AC
  Administered 2022-05-07: 9 mL via PERINEURAL

## 2022-05-07 MED ORDER — LIDOCAINE HCL 2 % IJ SOLN
20.0000 mL | Freq: Once | INTRAMUSCULAR | Status: AC
  Administered 2022-05-07: 400 mg

## 2022-05-07 MED ORDER — PENTAFLUOROPROP-TETRAFLUOROETH EX AERO
INHALATION_SPRAY | Freq: Once | CUTANEOUS | Status: AC
  Administered 2022-05-07: 30 via TOPICAL

## 2022-05-07 MED ORDER — TRIAMCINOLONE ACETONIDE 40 MG/ML IJ SUSP
40.0000 mg | Freq: Once | INTRAMUSCULAR | Status: AC
  Administered 2022-05-07: 40 mg

## 2022-05-07 NOTE — Patient Instructions (Signed)
____________________________________________________________________________________________  Virtual Visits   ID our calls: Add these numbers to your list of contacts on your smart phone. Label it as "PAIN Management" Nursing: [phone] (Main) Dr. Cephus Tupy: [phone]   What is a "Virtual Visit"? It is an electronic healthcare encounter (medical visit) that takes place on real time (NOT TEXT or E-MAIL) over the telephone or computer device (desktop, laptop, tablet, smart phone, etc.). It allows for more communication flexibility between the patient and the healthcare provider.  Who decides when these types of visits will be used? The physician.  Who is eligible for these types of visits? Only those patients that can be reliably reached over the telephone.  What do you mean by reliably? We do not have time to call everyone multiple times, therefore those patients that tend to screen calls and then call back later are not suitable candidates for this system. We all hate telemarketers and "Robocalls".  We understand how people are reluctant to pickup on calls from "unknown numbers", therefore, we suggest you add our numbers to your list of contacts. This way, you should be able to identify our calls. All of our numbers are available above.   Who is not eligible? This option is not appropriate for medication management. Patients on controlled substances have to come in for "Face-to-Face" encounters. Monitoring of these substances is mandatory. Virtual visits do not allow for unannounced drug screening tests or pill counts. Not bringing your pills, or the empty bottles, may result in no refill.  When will this type of visits be used? For follow-up after procedures on established patients, as long as they have had the same procedure done before.  Whenever you are physically unable to attend a regular appointment.   Can I request my medication visit to be "Virtual"? Yes. Available on  a limited basis, only if you are unable to physically attend your appointment. However, you may only receive a 30-day prescription. Abuse of this option may result in discontinuation of medication due to inability to properly monitor the therapy.  When will I be called?  You will receive an initial call from [phone], from our nursing staff, one business day prior to your appointment. (For Monday appointments you will be called on Friday.) The purpose of this call is to review your medications and the results of any recent procedure.  If the nursing staff is unable to make contact, your virtual encounter may be canceled and rescheduled to a face-to-face visit.  Your provider will call you on the day of your appointment.  At what time will I be called? Providers will call whenever there is time available. Do not expect calls at any specific time. On the schedule, you will have an appointment time assigned to you however, this is seldom accurate. This is done simply to keep a list of patients to be called. Be advised that calls may come at anytime during the day. Calls start as early as 8:00 AM and go as late as 8:00 PM. This will depend on provider availability. The system is not perfect. If this is inconvenient for you, please request to be changed to an "in-person" appointment.   ____________________________________________________________________________________________    ___________________________________________________________________________________________  Post-Radiofrequency (RF) Discharge Instructions  You have just completed a Radiofrequency Neurotomy.  The following instructions will provide you with information and guidelines for self-care upon discharge.  If at any time you have questions or concerns please call your physician. DO NOT DRIVE YOURSELF!!  Instructions: Apply   ice: Fill a plastic sandwich bag with crushed ice. Cover it with a small towel and apply to injection  site. Apply for 15 minutes then remove x 15 minutes. Repeat sequence on day of procedure, until you go to bed. The purpose is to minimize swelling and discomfort after procedure. Apply heat: Apply heat to procedure site starting the day following the procedure. The purpose is to treat any soreness and discomfort from the procedure. Food intake: No eating limitations, unless stipulated above.  Nevertheless, if you have had sedation, you may experience some nausea.  In this case, it may be wise to wait at least two hours prior to resuming regular diet. Physical activities: Keep activities to a minimum for the first 8 hours after the procedure. For the first 24 hours after the procedure, do not drive a motor vehicle,  Operate heavy machinery, power tools, or handle any weapons.  Consider walking with the use of an assistive device or accompanied by an adult for the first 24 hours.  Do not drink alcoholic beverages including beer.  Do not make any important decisions or sign any legal documents. Go home and rest today.  Resume activities tomorrow, as tolerated.  Use caution in moving about as you may experience mild leg weakness.  Use caution in cooking, use of household electrical appliances and climbing steps. Driving: If you have received any sedation, you are not allowed to drive for 24 hours after your procedure. Blood thinner: Restart your blood thinner 6 hours after your procedure. (Only for those taking blood thinners) Insulin: As soon as you can eat, you may resume your normal dosing schedule. (Only for those taking insulin) Medications: May resume pre-procedure medications.  Do not take any drugs, other than what has been prescribed to you. Infection prevention: Keep procedure site clean and dry. Post-procedure Pain Diary: Extremely important that this be done correctly and accurately. Recorded information will be used to determine the next step in treatment. Pain evaluated is that of treated area  only. Do not include pain from an untreated area. Complete every hour, on the hour, for the initial 8 hours. Set an alarm to help you do this part accurately. Do not go to sleep and have it completed later. It will not be accurate. Follow-up appointment: Keep your follow-up appointment after the procedure. Usually 2-6 weeks after radiofrequency. Bring you pain diary. The information collected will be essential for your long-term care.   Expect: From numbing medicine (AKA: Local Anesthetics): Numbness or decrease in pain. Onset: Full effect within 15 minutes of injected. Duration: It will depend on the type of local anesthetic used. On the average, 1 to 8 hours.  From steroids (when added): Decrease in swelling or inflammation. Once inflammation is improved, relief of the pain will follow. Onset of benefits: Depends on the amount of swelling present. The more swelling, the longer it will take for the benefits to be seen. In some cases, up to 10 days. Duration: Steroids will stay in the system x 2 weeks. Duration of benefits will depend on multiple posibilities including persistent irritating factors. From procedure: Some discomfort is to be expected once the numbing medicine wears off. In the case of radiofrequency procedures, this may last as long as 6 weeks. Additional post-procedure pain medication is provided for this. Discomfort is minimized if ice and heat are applied as instructed.  Call if: You experience numbness and weakness that gets worse with time, as opposed to wearing off. He experience any unusual   bleeding, difficulty breathing, or loss of the ability to control your bowel and bladder. (This applies to Spinal procedures only) You experience any redness, swelling, heat, red streaks, elevated temperature, fever, or any other signs of a possible infection.  Emergency Numbers: Durning business hours (Monday - Thursday, 8:00 AM - 4:00 PM) (Friday, 9:00 AM - 12:00 Noon): [phone] After hours: [phone] ____________________________________________________________________________________________    

## 2022-05-07 NOTE — Progress Notes (Signed)
PROVIDER NOTE: Interpretation of information contained herein should be left to medically-trained personnel. Specific patient instructions are provided elsewhere under "Patient Instructions" section of medical record. This document was created in part using STT-dictation technology, any transcriptional errors that may result from this process are unintentional.  Patient: Samuel [name] Type: Established DOB: 06-28-1975 MRN: [id number] PCP: Loyola Mast, PA-C  Service: Procedure DOS: 05/07/2022 Setting: Ambulatory Location: Ambulatory outpatient facility Delivery: Face-to-face Provider: Gaspar Cola, MD Specialty: Interventional Pain Management Specialty designation: 09 Location: Outpatient facility Ref. Prov.: Loyola Mast, PA-C    Procedure:           Type: Lumbar Facet, Medial Branch Radiofrequency Ablation (RFA) #4  Laterality: Right (-RT)  Level: L2, L3, L4, L5, & S1 Medial Branch Level(s). These levels will denervate the L3-4, L4-5 and L5-S1 lumbar facet joints.  Imaging: Fluoroscopy-guided         Anesthesia: Local anesthesia (1-2% Lidocaine) Anxiolysis: IV Versed 3.0 mg Sedation: Moderate Sedation Fentanyl 2.0 mL (100 mcg) DOS: 05/07/2022  Performed by: Gaspar Cola, MD  Purpose: Therapeutic/Palliative Indications: Low back pain severe enough to impact quality of life or function. Indications: 1. Lumbar facet syndrome (Bilateral) (R>L)   2. Spondylosis without myelopathy or radiculopathy, lumbosacral region   3. DDD (degenerative disc disease), lumbar   4. Chronic low back pain (1ry area of Pain) (Bilateral) (R>L) w/o sciatica   5. Lumbar post-laminectomy syndrome    Samuel [name] has been dealing with the above chronic pain for longer than three months and has either failed to respond, was unable to tolerate, or simply did not get enough benefit from other more conservative therapies including, but not limited to: 1. Over-the-counter medications 2.  Anti-inflammatory medications 3. Muscle relaxants 4. Membrane stabilizers 5. Opioids 6. Physical therapy and/or chiropractic manipulation 7. Modalities (Heat, ice, etc.) 8. Invasive techniques such as nerve blocks. Samuel [name] has attained more than 50% relief of the pain from a series of diagnostic injections conducted in separate occasions.  Pain Score: Pre-procedure: 5 /10 Post-procedure: 0-No pain/10     Post-procedure evaluation   Type: Lumbar Facet, Medial Branch Radiofrequency Ablation (RFA) #4  Laterality: Left (-LT)  Level: L2, L3, L4, L5, & S1 Medial Branch Level(s). These levels will denervate the L3-4, L4-5 and L5-S1 lumbar facet joints.  Imaging: Fluoroscopy-guided         Anesthesia: Local anesthesia (1-2% Lidocaine) Anxiolysis: IV Versed 3.0 mg Sedation: Moderate Sedation Fentanyl 2.0 mL (100 mcg) DOS: 04/21/2022  Performed by: Gaspar Cola, MD  Purpose: Therapeutic/Palliative Indications: Low back pain severe enough to impact quality of life or function.  Pain Score: Pre-procedure: 5 /10 Post-procedure: 0-No pain/10     Effectiveness:  Initial hour after procedure: 60 %. Subsequent 4-6 hours post-procedure: 60 %. Analgesia past initial 6 hours: 30 %. Ongoing improvement:  Analgesic: The patient indicates that he has an ongoing 30% improvement on the left side however, he has only been 16 days since the radiofrequency ablation and it usually takes 6 weeks for the postprocedure healing process. Function: Somewhat improved ROM: Somewhat improved  Position / Prep / Materials:  Position: Prone  Prep solution: DuraPrep (Iodine Povacrylex [0.7% available iodine] and Isopropyl Alcohol, 74% w/w) Prep Area: Entire Lumbosacral Region (Lower back from mid-thoracic region to end of tailbone and from flank to flank.) Materials:  Tray: RFA (Radiofrequency) tray Needle(s):  Type: RFA (Teflon-coated radiofrequency ablation needles) Gauge (G): 20  Length: Long  (15cm) Qty: 5  Pre-op  H&P Assessment:  Samuel [name] is a 46 y.o. (year old), male patient, seen today for interventional treatment. He  has a past surgical history that includes Back surgery. Samuel [name] has a current medication list which includes the following prescription(s): azelastine hcl, carvedilol, gabapentin, lactulose, morphine, multivitamin, oxycodone hcl, pantoprazole, and tizanidine, and the following Facility-Administered Medications: fentanyl. His primarily concern today is the Back Pain (Right, lower)  Initial Vital Signs:  Pulse/HCG Rate: (!) 53ECG Heart Rate: (!) 53 (SB) Temp: (!) 97.2 F (36.2 C) Resp: 16 BP: 122/71 SpO2: 99 %  BMI: Estimated body mass index is 34.3 kg/m as calculated from the following:   Height as of this encounter: '6\' 1"'$  (1.854 m).   Weight as of this encounter: 260 lb (117.9 kg).  Risk Assessment: Allergies: Reviewed. He has no allergies on file.  Allergy Precautions: None required Coagulopathies: Reviewed. None identified.  Blood-thinner therapy: None at this time Active Infection(s): Reviewed. None identified. Samuel [name] is afebrile  Site Confirmation: Samuel [name] was asked to confirm the procedure and laterality before marking the site Procedure checklist: Completed Consent: Before the procedure and under the influence of no sedative(s), amnesic(s), or anxiolytics, the patient was informed of the treatment options, risks and possible complications. To fulfill our ethical and legal obligations, as recommended by the American Medical Association's Code of Ethics, I have informed the patient of my clinical impression; the nature and purpose of the treatment or procedure; the risks, benefits, and possible complications of the intervention; the alternatives, including doing nothing; the risk(s) and benefit(s) of the alternative treatment(s) or procedure(s); and the risk(s) and benefit(s) of doing nothing. The patient was provided information about the general  risks and possible complications associated with the procedure. These may include, but are not limited to: failure to achieve desired goals, infection, bleeding, organ or nerve damage, allergic reactions, paralysis, and death. In addition, the patient was informed of those risks and complications associated to Spine-related procedures, such as failure to decrease pain; infection (i.e.: Meningitis, epidural or intraspinal abscess); bleeding (i.e.: epidural hematoma, subarachnoid hemorrhage, or any other type of intraspinal or peri-dural bleeding); organ or nerve damage (i.e.: Any type of peripheral nerve, nerve root, or spinal cord injury) with subsequent damage to sensory, motor, and/or autonomic systems, resulting in permanent pain, numbness, and/or weakness of one or several areas of the body; allergic reactions; (i.e.: anaphylactic reaction); and/or death. Furthermore, the patient was informed of those risks and complications associated with the medications. These include, but are not limited to: allergic reactions (i.e.: anaphylactic or anaphylactoid reaction(s)); adrenal axis suppression; blood sugar elevation that in diabetics may result in ketoacidosis or comma; water retention that in patients with history of congestive heart failure may result in shortness of breath, pulmonary edema, and decompensation with resultant heart failure; weight gain; swelling or edema; medication-induced neural toxicity; particulate matter embolism and blood vessel occlusion with resultant organ, and/or nervous system infarction; and/or aseptic necrosis of one or more joints. Finally, the patient was informed that Medicine is not an exact science; therefore, there is also the possibility of unforeseen or unpredictable risks and/or possible complications that may result in a catastrophic outcome. The patient indicated having understood very clearly. We have given the patient no guarantees and we have made no promises. Enough  time was given to the patient to ask questions, all of which were answered to the patient's satisfaction. Samuel [name] has indicated that he wanted to continue with the procedure. Attestation: I, the ordering provider,  attest that I have discussed with the patient the benefits, risks, side-effects, alternatives, likelihood of achieving goals, and potential problems during recovery for the procedure that I have provided informed consent. Date  Time: 05/07/2022  7:58 AM  Pre-Procedure Preparation:  Monitoring: As per clinic protocol. Respiration, ETCO2, SpO2, BP, heart rate and rhythm monitor placed and checked for adequate function Safety Precautions: Patient was assessed for positional comfort and pressure points before starting the procedure. Time-out: I initiated and conducted the "Time-out" before starting the procedure, as per protocol. The patient was asked to participate by confirming the accuracy of the "Time Out" information. Verification of the correct person, site, and procedure were performed and confirmed by me, the nursing staff, and the patient. "Time-out" conducted as per Joint Commission's Universal Protocol (UP.01.01.01). Time: 0828  Description of Procedure:          Laterality: Right Levels:  L2, L3, L4, L5, & S1 Medial Branch Level(s). Safety Precautions: Aspiration looking for blood return was conducted prior to all injections. At no point did we inject any substances, as a needle was being advanced. Before injecting, the patient was told to immediately notify me if he was experiencing any new onset of "ringing in the ears, or metallic taste in the mouth". No attempts were made at seeking any paresthesias. Safe injection practices and needle disposal techniques used. Medications properly checked for expiration dates. SDV (single dose vial) medications used. After the completion of the procedure, all disposable equipment used was discarded in the proper designated medical waste  containers. Local Anesthesia: Protocol guidelines were followed. The patient was positioned over the fluoroscopy table. The area was prepped in the usual manner. The time-out was completed. The target area was identified using fluoroscopy. A 12-in long, straight, sterile hemostat was used with fluoroscopic guidance to locate the targets for each level blocked. Once located, the skin was marked with an approved surgical skin marker. Once all sites were marked, the skin (epidermis, dermis, and hypodermis), as well as deeper tissues (fat, connective tissue and muscle) were infiltrated with a small amount of a short-acting local anesthetic, loaded on a 10cc syringe with a 25G, 1.5-in  Needle. An appropriate amount of time was allowed for local anesthetics to take effect before proceeding to the next step. Technical description of process:  Radiofrequency Ablation (RFA) L2 Medial Branch Nerve RFA: The target area for the L2 medial branch is at the junction of the postero-lateral aspect of the superior articular process and the superior, posterior, and medial edge of the transverse process of L3. Under fluoroscopic guidance, a Radiofrequency needle was inserted until contact was made with os over the superior postero-lateral aspect of the pedicular shadow (target area). Sensory and motor testing was conducted to properly adjust the position of the needle. Once satisfactory placement of the needle was achieved, the numbing solution was slowly injected after negative aspiration for blood. 2.0 mL of the nerve block solution was injected without difficulty or complication. After waiting for at least 3 minutes, the ablation was performed. Once completed, the needle was removed intact. L3 Medial Branch Nerve RFA: The target area for the L3 medial branch is at the junction of the postero-lateral aspect of the superior articular process and the superior, posterior, and medial edge of the transverse process of L4. Under  fluoroscopic guidance, a Radiofrequency needle was inserted until contact was made with os over the superior postero-lateral aspect of the pedicular shadow (target area). Sensory and motor testing was conducted to  properly adjust the position of the needle. Once satisfactory placement of the needle was achieved, the numbing solution was slowly injected after negative aspiration for blood. 2.0 mL of the nerve block solution was injected without difficulty or complication. After waiting for at least 3 minutes, the ablation was performed. Once completed, the needle was removed intact. L4 Medial Branch Nerve RFA: The target area for the L4 medial branch is at the junction of the postero-lateral aspect of the superior articular process and the superior, posterior, and medial edge of the transverse process of L5. Under fluoroscopic guidance, a Radiofrequency needle was inserted until contact was made with os over the superior postero-lateral aspect of the pedicular shadow (target area). Sensory and motor testing was conducted to properly adjust the position of the needle. Once satisfactory placement of the needle was achieved, the numbing solution was slowly injected after negative aspiration for blood. 2.0 mL of the nerve block solution was injected without difficulty or complication. After waiting for at least 3 minutes, the ablation was performed. Once completed, the needle was removed intact. L5 Medial Branch Nerve RFA: The target area for the L5 medial branch is at the junction of the postero-lateral aspect of the superior articular process of S1 and the superior, posterior, and medial edge of the sacral ala. Under fluoroscopic guidance, a Radiofrequency needle was inserted until contact was made with os over the superior postero-lateral aspect of the pedicular shadow (target area). Sensory and motor testing was conducted to properly adjust the position of the needle. Once satisfactory placement of the needle was  achieved, the numbing solution was slowly injected after negative aspiration for blood. 2.0 mL of the nerve block solution was injected without difficulty or complication. After waiting for at least 3 minutes, the ablation was performed. Once completed, the needle was removed intact. S1 Medial Branch Nerve RFA: The target area for the S1 medial branch is located inferior to the junction of the S1 superior articular process and the L5 inferior articular process, posterior, inferior, and lateral to the 6 o'clock position of the L5-S1 facet joint, just superior to the S1 posterior foramen. Under fluoroscopic guidance, the Radiofrequency needle was advanced until contact was made with os over the Target area. Sensory and motor testing was conducted to properly adjust the position of the needle. Once satisfactory placement of the needle was achieved, the numbing solution was slowly injected after negative aspiration for blood. 2.0 mL of the nerve block solution was injected without difficulty or complication. After waiting for at least 3 minutes, the ablation was performed. Once completed, the needle was removed intact. Radiofrequency lesioning (ablation):  Radiofrequency Generator: Medtronic AccurianTM AG 1000 RF Generator Sensory Stimulation Parameters: 50 Hz was used to locate & identify the nerve, making sure that the needle was positioned such that there was no sensory stimulation below 0.3 V or above 0.7 V. Motor Stimulation Parameters: 2 Hz was used to evaluate the motor component. Care was taken not to lesion any nerves that demonstrated motor stimulation of the lower extremities at an output of less than 2.5 times that of the sensory threshold, or a maximum of 2.0 V. Lesioning Technique Parameters: Standard Radiofrequency settings. (Not bipolar or pulsed.) Temperature Settings: 80 degrees C Lesioning time: 60 seconds Intra-operative Compliance: Compliant  Once the entire procedure was completed, the  treated area was cleaned, making sure to leave some of the prepping solution back to take advantage of its long term bactericidal properties.    Illustration of  the posterior view of the lumbar spine and the posterior neural structures. Laminae of L2 through S1 are labeled. DPRL5, dorsal primary ramus of L5; DPRS1, dorsal primary ramus of S1; DPR3, dorsal primary ramus of L3; FJ, facet (zygapophyseal) joint L3-L4; I, inferior articular process of L4; LB1, lateral branch of dorsal primary ramus of L1; IAB, inferior articular branches from L3 medial branch (supplies L4-L5 facet joint); IBP, intermediate branch plexus; MB3, medial branch of dorsal primary ramus of L3; NR3, third lumbar nerve root; S, superior articular process of L5; SAB, superior articular branches from L4 (supplies L4-5 facet joint also); TP3, transverse process of L3.  Vitals:   05/07/22 0903 05/07/22 0913 05/07/22 0923 05/07/22 0931  BP: 125/70 129/62 (!) 129/52 131/61  Pulse:      Resp: 18 16    Temp:      TempSrc:      SpO2: 95% 99% 98% 99%  Weight:      Height:        Start Time: 0828 hrs. End Time: 0903 hrs.  Imaging Guidance (Spinal):          Type of Imaging Technique: Fluoroscopy Guidance (Spinal) Indication(s): Assistance in needle guidance and placement for procedures requiring needle placement in or near specific anatomical locations not easily accessible without such assistance. Exposure Time: Please see nurses notes. Contrast: None used. Fluoroscopic Guidance: I was personally present during the use of fluoroscopy. "Tunnel Vision Technique" used to obtain the best possible view of the target area. Parallax error corrected before commencing the procedure. "Direction-depth-direction" technique used to introduce the needle under continuous pulsed fluoroscopy. Once target was reached, antero-posterior, oblique, and lateral fluoroscopic projection used confirm needle placement in all planes. Images permanently stored  in EMR. Interpretation: No contrast injected. I personally interpreted the imaging intraoperatively. Adequate needle placement confirmed in multiple planes. Permanent images saved into the patient's record.  Antibiotic Prophylaxis:   Anti-infectives (From admission, onward)    None      Indication(s): None identified  Post-operative Assessment:  Post-procedure Vital Signs:  Pulse/HCG Rate: (!) 53(!) 57 Temp: (!) 97.2 F (36.2 C) Resp: 16 BP: 131/61 SpO2: 99 %  EBL: None  Complications: No immediate post-treatment complications observed by team, or reported by patient.  Note: The patient tolerated the entire procedure well. A repeat set of vitals were taken after the procedure and the patient was kept under observation following institutional policy, for this type of procedure. Post-procedural neurological assessment was performed, showing return to baseline, prior to discharge. The patient was provided with post-procedure discharge instructions, including a section on how to identify potential problems. Should any problems arise concerning this procedure, the patient was given instructions to immediately contact us, at any time, without hesitation. In any case, we plan to contact the patient by telephone for a follow-up status report regarding this interventional procedure.  Comments:  No additional relevant information.  Plan of Care  Orders:  Orders Placed This Encounter  Procedures   Radiofrequency,Lumbar    Scheduling Instructions:     Side(s): Right-sided     Level: L3-4, L4-5, & L5-S1 Facets (L2, L3, L4, L5, & S1 Medial Branch Nerves)     Sedation: With Sedation.     Timeframe: Today    Order Specific Question:   Where will this procedure be performed?    Answer:   [facility]   DG [facility] C-ARM 1-60 MIN NO REPORT    Intraoperative interpretation by procedural physician at [facility].    Standing Status:   Standing    Number of Occurrences:    1    Order Specific Question:   Reason for exam:    Answer:   Assistance in needle guidance and placement for procedures requiring needle placement in or near specific anatomical locations not easily accessible without such assistance.   Informed Consent Details: Physician/Practitioner Attestation; Transcribe to consent form and obtain patient signature    Nursing Order: Transcribe to consent form and obtain patient signature. Note: Always confirm laterality of pain with Samuel [name], before procedure.    Order Specific Question:   Physician/Practitioner attestation of informed consent for procedure/surgical case    Answer:   I, the physician/practitioner, attest that I have discussed with the patient the benefits, risks, side effects, alternatives, likelihood of achieving goals and potential problems during recovery for the procedure that I have provided informed consent.    Order Specific Question:   Procedure    Answer:   Lumbar Facet Radiofrequency Ablation    Order Specific Question:   Physician/Practitioner performing the procedure    Answer:   Yuriko Portales A. Dossie Arbour, MD    Order Specific Question:   Indication/Reason    Answer:   Low Back Pain, with our without leg pain, due to Facet Joint Arthralgia (Joint Pain) known as Lumbar Facet Syndrome, secondary to Lumbar, and/or Lumbosacral Spondylosis (Arthritis of the Spine), without myelopathy or radiculopathy (Nerve Damage).   Provide equipment / supplies at bedside    Procedure tray: "Radiofrequency Tray" Additional material: Large hemostat (x1); Small hemostat (x1); Towels (x8); 4x4 sterile sponge pack (x1) Needle type: Teflon-coated Radiofrequency Needle (Disposable  single use) Size: Long Quantity: 5    Standing Status:   Standing    Number of Occurrences:   1    Order Specific Question:   Specify    Answer:   Radiofrequency Tray   Chronic Opioid Analgesic:  No chronic opioid analgesics therapy prescribed by our practice.  MS Contin 30  mg every 12 hours (# 60) Marcello Moores, Vanessa Ralphs, NP/Andrew Derl Barrow, MD); oxycodone IR 10 mg, 1 tab p.o. 4 times daily (# 120) Marcello Moores, Vanessa Ralphs, NP/Andrew Derl Barrow, MD)   Medications ordered for procedure: Meds ordered this encounter  Medications   lidocaine (XYLOCAINE) 2 % (with pres) injection 400 mg   pentafluoroprop-tetrafluoroeth (GEBAUERS) aerosol   lactated ringers infusion   midazolam (VERSED) 5 MG/5ML injection 0.5-2 mg    Make sure Flumazenil is available in the pyxis when using this medication. If oversedation occurs, administer 0.2 mg IV over 15 sec. If after 45 sec no response, administer 0.2 mg again over 1 min; may repeat at 1 min intervals; not to exceed 4 doses (1 mg)   fentaNYL (SUBLIMAZE) injection 25-50 mcg    Make sure Narcan is available in the pyxis when using this medication. In the event of respiratory depression (RR< 8/min): Titrate NARCAN (naloxone) in increments of 0.1 to 0.2 mg IV at 2-3 minute intervals, until desired degree of reversal.   ropivacaine (PF) 2 mg/mL (0.2%) (NAROPIN) injection 9 mL   triamcinolone acetonide (KENALOG-40) injection 40 mg   Medications administered: We administered lidocaine, pentafluoroprop-tetrafluoroeth, lactated ringers, midazolam, fentaNYL, ropivacaine (PF) 2 mg/mL (0.2%), and triamcinolone acetonide.  See the medical record for exact dosing, route, and time of administration.  Follow-up plan:   Return in about 6 weeks (around 06/18/2022) for Proc-day (T,Th), (VV), (PPE).       Interventional Therapies  Risk  Complexity Considerations:   Estimated body mass index is 34.3 kg/m as calculated from the following:   Height as of this encounter: '6\' 1"'$  (1.854 m).   Weight as of this encounter: 260 lb (117.9 kg).  Risks: Thrombocytopenia    Planned  Pending:   Palliative right lumbar facet RFA #4 (next)  Therapeutic left lumbar facet RFA #4 (04/21/2022)    Under consideration:      Completed:   Therapeutic  left lumbar facet (MB L2-S1) RFA #3 (04/01/2016, 04/05/2018, 10/24/2019) (53/70/70/50/50)  Therapeutic right lumbar facet (MB L2-S1) RFA #3 (12/24/2015, 02/17/2018, 11/16/2019) (50/80/80/40/50)    Completed by other providers:   N/a   Therapeutic  Palliative (PRN) options:   None established   Pharmacological Recommendations:   Medication management by Alysia Penna, MD     Recent Visits Date Type Provider Dept  04/21/22 Procedure visit Milinda Pointer, MD [facility]  04/06/22 Office Visit Milinda Pointer, MD [facility]  Showing recent visits within past 90 days and meeting all other requirements Today's Visits Date Type Provider Dept  05/07/22 Procedure visit Milinda Pointer, MD [facility]  Showing today's visits and meeting all other requirements Future Appointments Date Type Provider Dept  06/18/22 Appointment Milinda Pointer, MD [facility]  Showing future appointments within next 90 days and meeting all other requirements  Disposition: Discharge home  Discharge (Date  Time): 05/07/2022; 0933 hrs.   Primary Care Physician: Annye English Location: [facility] Note by: Gaspar Cola, MD Date: 05/07/2022; Time: 11:50 AM  Disclaimer:  Medicine is not an exact science. The only guarantee in medicine is that nothing is guaranteed. It is important to note that the decision to proceed with this intervention was based on the information collected from the patient. The Data and conclusions were drawn from the patient's questionnaire, the interview, and the physical examination. Because the information was provided in large part by the patient, it cannot be guaranteed that it has not been purposely or unconsciously manipulated. Every effort has been made to obtain as much relevant data as possible for this evaluation. It is important to note that the conclusions that lead to this procedure are  derived in large part from the available data. Always take into account that the treatment will also be dependent on availability of resources and existing treatment guidelines, considered by other Pain Management Practitioners as being common knowledge and practice, at the time of the intervention. For Medico-Legal purposes, it is also important to point out that variation in procedural techniques and pharmacological choices are the acceptable norm. The indications, contraindications, technique, and results of the above procedure should only be interpreted and judged by a Board-Certified Interventional Pain Specialist with extensive familiarity and expertise in the same exact procedure and technique.

## 2022-05-08 ENCOUNTER — Encounter: Payer: Self-pay | Admitting: Registered Nurse

## 2022-05-08 ENCOUNTER — Telehealth: Payer: Self-pay

## 2022-05-08 ENCOUNTER — Encounter: Payer: PPO | Attending: Physical Medicine & Rehabilitation | Admitting: Registered Nurse

## 2022-05-08 VITALS — BP 146/76 | HR 56 | Ht 73.0 in | Wt 269.0 lb

## 2022-05-08 DIAGNOSIS — Z5181 Encounter for therapeutic drug level monitoring: Secondary | ICD-10-CM | POA: Diagnosis not present

## 2022-05-08 DIAGNOSIS — R001 Bradycardia, unspecified: Secondary | ICD-10-CM | POA: Diagnosis not present

## 2022-05-08 DIAGNOSIS — G894 Chronic pain syndrome: Secondary | ICD-10-CM | POA: Diagnosis not present

## 2022-05-08 DIAGNOSIS — M5416 Radiculopathy, lumbar region: Secondary | ICD-10-CM | POA: Insufficient documentation

## 2022-05-08 DIAGNOSIS — M5136 Other intervertebral disc degeneration, lumbar region: Secondary | ICD-10-CM | POA: Diagnosis not present

## 2022-05-08 DIAGNOSIS — Z79891 Long term (current) use of opiate analgesic: Secondary | ICD-10-CM | POA: Diagnosis not present

## 2022-05-08 DIAGNOSIS — M961 Postlaminectomy syndrome, not elsewhere classified: Secondary | ICD-10-CM | POA: Diagnosis not present

## 2022-05-08 MED ORDER — MORPHINE SULFATE ER 30 MG PO TBCR: 30.0000 mg | EXTENDED_RELEASE_TABLET | Freq: Two times a day (BID) | ORAL | 0 refills | Status: DC

## 2022-05-08 MED ORDER — OXYCODONE HCL 10 MG PO TABS: 10.0000 mg | ORAL_TABLET | Freq: Four times a day (QID) | ORAL | 0 refills | Status: DC | PRN

## 2022-05-08 NOTE — Telephone Encounter (Signed)
Post procedure follow up.  Patient states he is doing fine.  

## 2022-05-08 NOTE — Progress Notes (Signed)
Subjective:    Patient ID: Samuel [name], male    DOB: 04/10/1976, 46 y.o.   MRN: [id number]  HPI: Samuel [name] is a 46 y.o. male who returns for follow up appointment for chronic pain and medication refill. He states his pain is located in his lower back radiating into his bilateral lower extremities. He rates his pain 5. His current exercise regime is walking and performing stretching exercises.  Samuel [name] is 120.00 MME.   Last UDS was Performed on 01/09/2022, it was consistent.    Pain Inventory Average Pain 5 Pain Right Now 5 My pain is constant, sharp, burning, dull, stabbing, tingling, and aching  In the last 24 hours, has pain interfered with the following? General activity 9 Relation with others 9 Enjoyment of life 9 What TIME of day is your pain at its worst? morning , daytime, evening, and night Sleep (in general) Fair  Pain is worse with: walking, bending, sitting, inactivity, and standing Pain improves with: rest Relief from Meds: 5  Family History  Problem Relation Age of Onset   Hypertension Father    Social History   Socioeconomic History   Marital status: Married    Spouse name: Not on file   Number of children: Not on file   Years of education: Not on file   Highest education level: Not on file  Occupational History   Not on file  Tobacco Use   Smoking status: Never   Smokeless tobacco: Never  Vaping Use   Vaping Use: Never used  Substance and Sexual Activity   Alcohol use: No    Alcohol/week: 0.0 standard drinks of alcohol   Drug use: No   Sexual activity: Not on file  Other Topics Concern   Not on file  Social History Narrative   Not on file   Social Determinants of Health   Financial Resource Strain: Not on file  Food Insecurity: Not on file  Transportation Needs: Not on file  Physical Activity: Not on file  Stress: Not on file  Social Connections: Not on file   Past Surgical History:  Procedure  Laterality Date   BACK SURGERY     lower back surgery x 2   Past Surgical History:  Procedure Laterality Date   BACK SURGERY     lower back surgery x 2   Past Medical History:  Diagnosis Date   Arthritis    "some in my back"   GERD (gastroesophageal reflux disease)    Headache(784.0)    Wt 269 lb (122 kg)   BMI 35.49 kg/m   Opioid Risk Score:   Fall Risk Score:  `1  Depression screen [facility] 2/9     05/07/2022    7:59 AM 04/07/2022    9:58 AM 04/06/2022    8:24 AM 01/09/2022    9:55 AM 12/08/2021    9:10 AM 10/14/2021    9:30 AM 09/12/2021    9:50 AM  Depression screen PHQ 2/9  Decreased Interest 0 1 0 1 0 0 0  Down, Depressed, Hopeless 0 1 0 1 0 1 0  PHQ - 2 Score 0 2 0 2 0 1 0    Review of Systems  Musculoskeletal:  Positive for back pain and gait problem.  All other systems reviewed and are negative.      Objective:   Physical Exam Vitals and nursing note reviewed.  Constitutional:      Appearance: Normal appearance.  Cardiovascular:  Rate and Rhythm: Normal rate and regular rhythm.     Pulses: Normal pulses.     Heart sounds: Normal heart sounds.  Musculoskeletal:     Cervical back: Normal range of motion and neck supple.     Comments: Normal Muscle Bulk and Muscle Testing Reveals:  Upper Extremities: Full ROM and Muscle Strength 5/5  Lumbar Hypersensitivity Lower Extremities: Decreased ROM and Muscle Strength 5/5 Bilateral Lower Extremities Flexion Produces Pain into his Lower Back Arises from Table slowly Antalgic  Gait     Skin:    General: Skin is warm and dry.  Neurological:     Mental Status: He is alert and oriented to person, place, and time.  Psychiatric:        Mood and Affect: Mood normal.        Behavior: Behavior normal.         Assessment & Plan:  1.  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 He has lumbar degenerative disc disease. Continue current medication regimen with  Gabapentin, Continue  current Medication regimen . S/P  Left  Lumbar Facet Radiofrequency Ablation with sedation, on 04/21/2022 by Dr. Laban Emperor with good results noted and S/P Right RFA with Dr. Shauna Hugh on 12/14,2023, with good relief noted.  Refilled: MS Contin 30 mg one tablet every 12 hours  #60 and Oxycodone 10 mg one tablet every 6 hours as needed for pain #120. We will continue the opioid monitoring program, this consists of regular clinic visits, examinations, urine drug screen, pill counts as well as use of [place] Controlled Substance Reporting system. A 12 month History has been reviewed on the [place] Controlled Substance Reporting System on 05/08/2022. 2. Sacroiliac Dysfunction: Continue with current medication, heat and exercise regime. 05/08/2022 3. Muscle Spasm: Continue Tizanidine as needed. Continue to Monitor.05/08/2022 4. Chronic Pain Syndrome: Continue MS Contin and Oxycodone as needed for pain. Continue to Monitor. 05/08/2022. 5. Left Greater Trochanteric Tenderness: Left Hip Osteoarthritis: S/P  left intra-articular hip injection  with 8 days of relief Samuel [name] reports. Continue with alternating Ice and Heat Therapy. Continue to monitor. 05/08/2022. 6. Bradycardia: Heart Rate is 57 today. He's prescribed Coreg and his  PCP and Gastroenterologist Following he states. We will continue to monitor.05/08/2022 7. Weight Loss: Gastroenterologist Following. Samuel [name] states he was instructed to lose weight by his gastroenterologist , he is following a healthy diet regime and exercise. We will continue to monitor. 05/08/2022.   F/U in 1 month

## 2022-05-11 ENCOUNTER — Other Ambulatory Visit: Payer: Self-pay | Admitting: Registered Nurse

## 2022-06-09 ENCOUNTER — Encounter: Payer: Self-pay | Admitting: Registered Nurse

## 2022-06-09 ENCOUNTER — Encounter: Payer: Medicare Other | Attending: Physical Medicine & Rehabilitation | Admitting: Registered Nurse

## 2022-06-09 VITALS — BP 130/59 | HR 60 | Ht 73.0 in | Wt 278.0 lb

## 2022-06-09 DIAGNOSIS — Z79891 Long term (current) use of opiate analgesic: Secondary | ICD-10-CM | POA: Diagnosis not present

## 2022-06-09 DIAGNOSIS — M7062 Trochanteric bursitis, left hip: Secondary | ICD-10-CM

## 2022-06-09 DIAGNOSIS — M51369 Other intervertebral disc degeneration, lumbar region without mention of lumbar back pain or lower extremity pain: Secondary | ICD-10-CM

## 2022-06-09 DIAGNOSIS — M5136 Other intervertebral disc degeneration, lumbar region: Secondary | ICD-10-CM

## 2022-06-09 DIAGNOSIS — M7061 Trochanteric bursitis, right hip: Secondary | ICD-10-CM | POA: Diagnosis not present

## 2022-06-09 DIAGNOSIS — R001 Bradycardia, unspecified: Secondary | ICD-10-CM

## 2022-06-09 DIAGNOSIS — G894 Chronic pain syndrome: Secondary | ICD-10-CM | POA: Diagnosis not present

## 2022-06-09 DIAGNOSIS — Z5181 Encounter for therapeutic drug level monitoring: Secondary | ICD-10-CM | POA: Diagnosis not present

## 2022-06-09 DIAGNOSIS — M5416 Radiculopathy, lumbar region: Secondary | ICD-10-CM

## 2022-06-09 DIAGNOSIS — M961 Postlaminectomy syndrome, not elsewhere classified: Secondary | ICD-10-CM | POA: Diagnosis not present

## 2022-06-09 MED ORDER — OXYCODONE HCL 10 MG PO TABS: 10.0000 mg | ORAL_TABLET | Freq: Four times a day (QID) | ORAL | 0 refills | Status: DC | PRN

## 2022-06-09 MED ORDER — MORPHINE SULFATE ER 30 MG PO TBCR: 30.0000 mg | EXTENDED_RELEASE_TABLET | Freq: Two times a day (BID) | ORAL | 0 refills | Status: DC

## 2022-06-09 NOTE — Progress Notes (Signed)
Subjective:    Patient ID: Samuel [name], male    DOB: [birth year], 47 y.o.   MRN: [id number]  HPI: Samuel [name] [name] a 47 y.o. male who returns for follow up appointment for chronic pain and medication refill. He states his pain [name] located in his lower back radiating into his bilateral lower extremities. He rates his pain 6. His current exercise regime [name] walking and performing stretching exercises.  Samuel [name].   UDS ordered today.    Pain Inventory Average Pain 5 Pain Right Now 6 My pain [name] constant, sharp, burning, dull, stabbing, and aching  In the last 24 hours, has pain interfered with the following? General activity 9 Relation with others 9 Enjoyment of life 9 What TIME of day [name] your pain at its worst? morning , evening, and night Sleep (in general) Fair  Pain [name] worse with: walking, bending, sitting, inactivity, and standing Pain improves with: rest and medication Relief from Meds: 5  Family History  Problem Relation Age of Onset   Hypertension Father    Social History   Socioeconomic History   Marital status: Married    Spouse name: Not on file   Number of children: Not on file   Years of education: Not on file   Highest education level: Not on file  Occupational History   Not on file  Tobacco Use   Smoking status: Never   Smokeless tobacco: Never  Vaping Use   Vaping Use: Never used  Substance and Sexual Activity   Alcohol use: No    Alcohol/week: 0.0 standard drinks of alcohol   Drug use: No   Sexual activity: Not on file  Other Topics Concern   Not on file  Social History Narrative   Not on file   Social Determinants of Health   Financial Resource Strain: Not on file  Food Insecurity: Not on file  Transportation Needs: Not on file  Physical Activity: Not on file  Stress: Not on file  Social Connections: Not on file   Past Surgical History:  Procedure Laterality Date   BACK SURGERY     lower back  surgery x 2   Past Surgical History:  Procedure Laterality Date   BACK SURGERY     lower back surgery x 2   Past Medical History:  Diagnosis Date   Arthritis    "some in my back"   GERD (gastroesophageal reflux disease)    Headache(784.0)    Ht '6\' 1"'$  (1.854 m)   Wt 278 lb (126.1 kg)   BMI 36.68 kg/m   Opioid Risk Score:   Fall Risk Score:  `1  Depression screen [facility] 2/9     05/08/2022    9:52 AM 05/07/2022    7:59 AM 04/07/2022    9:58 AM 04/06/2022    8:24 AM 01/09/2022    9:55 AM 12/08/2021    9:10 AM 10/14/2021    9:30 AM  Depression screen PHQ 2/9  Decreased Interest 0 0 1 0 1 0 0  Down, Depressed, Hopeless 0 0 1 0 1 0 1  PHQ - 2 Score 0 0 2 0 2 0 1    Review of Systems  Musculoskeletal:  Positive for back pain and gait problem.       PAIN IN BOTH LEGS  All other systems reviewed and are negative.      Objective:   Physical Exam Vitals and nursing note reviewed.  Constitutional:  Appearance: Normal appearance.  Cardiovascular:     Rate and Rhythm: Normal rate and regular rhythm.     Pulses: Normal pulses.     Heart sounds: Normal heart sounds.  Pulmonary:     Effort: Pulmonary effort [name] normal.     Breath sounds: Normal breath sounds. No stridor.  Musculoskeletal:     Cervical back: Normal range of motion and neck supple.     Comments: Normal Muscle Bulk and Muscle Testing Reveals:  Upper Extremities:Full  ROM and Muscle Strength 5/5  Lumbar Hypersensitivity Lower Extremities: Decreased ROM and Muscle Strength 5/5 Arises from Table slowly using cane for support Antalgic  Gait     Skin:    General: Skin [name] warm and dry.  Neurological:     Mental Status: He [name] alert and oriented to person, place, and time.  Psychiatric:        Mood and Affect: Mood normal.        Behavior: Behavior normal.         Assessment & Plan:  1.  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 He has lumbar degenerative disc disease.  Continue current medication regimen with  Gabapentin, Continue current Medication regimen . S/P  Left  Lumbar Facet Radiofrequency Ablation with sedation, on 04/21/2022 by Dr. Dossie [name] with good results noted and S/P Right RFA with Dr. Lowella [name] on 12/14,2023, with good relief noted.  Refilled: MS Contin 30 mg one tablet every 12 hours  #60 and Oxycodone 10 mg one tablet every 6 hours as needed for pain #120. We will continue the opioid monitoring program, this consists of regular clinic visits, examinations, urine drug screen, pill counts as well as use of [place] Controlled Substance Reporting system. A 12 month History has been reviewed on the [place] on 06/09/2022. 2. Sacroiliac Dysfunction: Continue with current medication, heat and exercise regime. 06/09/2022 3. Muscle Spasm: Continue Tizanidine as needed. Continue to Monitor.06/09/2022 4. Chronic Pain Syndrome: Continue MS Contin and Oxycodone as needed for pain. Continue to Monitor. 06/09/2022. 5. Left Greater Trochanteric Tenderness: Left Hip Osteoarthritis: No complaints today.  S/P  left intra-articular hip injection  with 8 days of relief [name] reports. Continue with alternating Ice and Heat Therapy. Continue to monitor. 06/09/2022. 6. Bradycardia: Heart Rate [name] 60 today. He's prescribed Coreg and his  PCP and Gastroenterologist Following he states. We will continue to monitor.06/09/2022 7. Weight Loss: Gastroenterologist Following. [name] states he was instructed to lose weight by his gastroenterologist , he [name] following a healthy diet regime and exercise. We will continue to monitor. 06/09/2022.   F/U in 1 month

## 2022-06-11 LAB — TOXASSURE SELECT,+ANTIDEPR,UR

## 2022-06-14 NOTE — Progress Notes (Unsigned)
Patient: Samuel [name]  Service Category: E/M  Provider: Gaspar Cola, MD  DOB: 06-Feb-1976  DOS: 06/18/2022  Location: Office  MRN: [id number]  Setting: Ambulatory outpatient  Referring Provider: Loyola Mast, PA-C  Type: Established Patient  Specialty: Interventional Pain Management  PCP: Loyola Mast, PA-C  Location: Remote location  Delivery: TeleHealth     Virtual Encounter - Pain Management PROVIDER NOTE: Information contained herein reflects review and annotations entered in association with encounter. Interpretation of such information and data should be left to medically-trained personnel. Information provided to patient can be located elsewhere in the medical record under "Patient Instructions". Document created using STT-dictation technology, any transcriptional errors that may result from process are unintentional.    Contact & Pharmacy Preferred: [phone] Home: [phone] (home) Mobile: [phone] (mobile) E-mail: [email or web address], [place] [address] Phone: [phone] Fax: [phone]  [id number] [place] [place] [place]: [phone]: [phone]  Pre-screening  Samuel [name] "in-person" vs "virtual" encounter. He indicated preferring virtual for this encounter.   Reason COVID-19*  Social distancing based on CDC and AMA recommendations.   I contacted Samuel [name] 06/18/2022 via telephone.      I clearly identified myself as FGaspar Cola MD. I verified that I was speaking with the correct person using two identifiers (Name: Samuel [name] and date of birth: 807-03-1976.  Consent I sought verbal advanced consent from Samuel Canterburyfor virtual visit interactions. I informed Mr. DDelfaveroof possible security and privacy concerns, risks, and limitations associated with  providing "not-in-person" medical evaluation and management services. I also informed Mr. DGershmanof the availability of "in-person" appointments. Finally, I informed him that there would be a charge for the virtual visit and that he could be  personally, fully or partially, financially responsible for it. Mr. DKunathexpressed understanding and agreed to proceed.   Historic Elements   Samuel [name] a 47y.o. year old, male patient evaluated today after our last contact on 05/07/2022. Mr. DTith [name] a past medical history of Arthritis, GERD (gastroesophageal reflux disease), and Headache(784.0). He also  [name] a past surgical history that includes Back surgery. Mr. DNieshas a current medication list which includes the following prescription(s): azelastine hcl, carvedilol, gabapentin, lactulose, morphine, multivitamin, oxycodone hcl, pantoprazole, and tizanidine. He  reports that he [name] never smoked. He [name] never used smokeless tobacco. He reports that he does not drink alcohol and does not use drugs. Mr. DFulfordhas no allergies on file.  Estimated body mass index is 36.68 kg/m as calculated from the following:   Height as of 06/09/22: '6\' 1"'$  (1.854 m).   Weight as of 06/09/22: 278 lb (126.1 kg).  HPI  Today, he is being contacted for a post-procedure assessment.  Post-procedure evaluation   Type: Lumbar Facet, Medial Branch Radiofrequency Ablation (RFA) #4  Laterality: Right (-RT)  Level: L2, L3, L4, L5, & S1 Medial Branch Level(s). These levels will denervate the L3-4, L4-5 and L5-S1 lumbar facet joints.  Imaging: Fluoroscopy-guided         Anesthesia: Local anesthesia (1-2% Lidocaine) Anxiolysis: IV Versed 3.0 mg Sedation: Moderate Sedation Fentanyl 2.0 mL (100 mcg) DOS: 05/07/2022  Performed by: FGaspar Cola MD  Purpose: Therapeutic/Palliative Indications: Low back pain severe enough to impact quality of life or function.  Indications: 1. Lumbar facet syndrome (Bilateral) (R>L)    2. Spondylosis without myelopathy or radiculopathy, lumbosacral region   3. DDD (degenerative disc disease), lumbar   4. Chronic low back pain (1ry area of Pain) (Bilateral) (R>L) w/o sciatica   5. Lumbar post-laminectomy syndrome    Samuel [name] been dealing with the above chronic pain for longer than three months and [name] either failed to respond, was unable to tolerate, or simply did not get enough benefit from other more conservative therapies including, but not limited to: 1. Over-the-counter medications 2. Anti-inflammatory medications 3. Muscle relaxants 4. Membrane stabilizers 5. Opioids 6. Physical therapy and/or chiropractic manipulation 7. Modalities (Heat, ice, etc.) 8. Invasive techniques such as nerve blocks. Samuel [name] attained more than 50% relief of the pain from a series of diagnostic injections conducted in separate occasions.  Pain Score: Pre-procedure: 5 /10 Post-procedure: 0-No pain/10     Effectiveness:  Initial hour after procedure: 75 %. Subsequent 4-6 hours post-procedure: 75 %. Analgesia past initial 6 hours: 40 % (ongoing). Ongoing improvement:  Analgesic:  *** Function:    ***    ROM:    ***     Pharmacotherapy Assessment   Opioid Analgesic: No chronic opioid analgesics therapy prescribed by our practice.  MS Contin 30 mg every 12 hours (# 60) Samuel [name], Samuel Ralphs, NP/Samuel Derl Barrow, MD); oxycodone IR 10 mg, 1 tab p.o. 4 times daily (# 120) Samuel [name], Samuel Ralphs, NP/Samuel Derl Barrow, MD)   Monitoring: [place] PMP: PDMP reviewed during this encounter.       Pharmacotherapy: No side-effects or adverse reactions reported. Compliance: No problems identified. Effectiveness: Clinically acceptable. Plan: Refer to "POC". UDS:  Summary  Date Value Ref Range Status  06/09/2022 Note  Final    Comment:    ==================================================================== ToxAssure  Select,+Antidepr,UR ==================================================================== Test                             Result       Flag       Units  Drug Present   Morphine                       18118                   ng/mg creat   Normorphine                    584                     ng/mg creat    Potential sources of large amounts of morphine in the absence of    codeine include administration of morphine or use of heroin.     Normorphine is an expected metabolite of morphine.    Hydromorphone                  132                     ng/mg creat    Hydromorphone may be present as a metabolite of morphine;    concentrations of hydromorphone rarely exceed 5% of the morphine    concentration when this is the source of hydromorphone.    Oxycodone                      4032  ng/mg creat   Noroxycodone                   5234                    ng/mg creat    Sources of oxycodone include scheduled prescription medications.    Noroxycodone is an expected metabolite of oxycodone.  ==================================================================== Test                      Result    Flag   Units      Ref Range   Creatinine              44               mg/dL      >=20 ==================================================================== Declared Medications:  Medication list was not provided. ==================================================================== For clinical consultation, please call [phone]. ====================================================================    No results found for: "CBDTHCR", "D8THCCBX", "D9THCCBX"   Laboratory Chemistry Profile   Renal Lab Results  Component Value Date   BUN 9 06/13/2013   CREATININE 0.97 06/13/2013   LABCREA 113.87 02/04/2015   GFRAA >90 06/13/2013   GFRNONAA >90 06/13/2013    Hepatic Lab Results  Component Value Date   AST 36 06/02/2013   ALT 47 06/02/2013   ALBUMIN 3.5 06/02/2013    ALKPHOS 78 06/02/2013    Electrolytes Lab Results  Component Value Date   NA 136 (L) 06/13/2013   K 4.4 06/13/2013   CL 100 06/13/2013   CALCIUM 8.1 (L) 06/13/2013    Bone No results found for: "VD25OH", "VD125OH2TOT", "SW1093AT5", "TD3220UR4", "25OHVITD1", "25OHVITD2", "25OHVITD3", "TESTOFREE", "TESTOSTERONE"  Inflammation (CRP: Acute Phase) (ESR: Chronic Phase) No results found for: "CRP", "ESRSEDRATE", "LATICACIDVEN"       Note: Above Lab results reviewed.  Imaging  DG PAIN CLINIC C-ARM 1-60 MIN NO REPORT Fluoro was used, but no Radiologist interpretation will be provided.  Please refer to "NOTES" tab for provider progress note.  Assessment  There were no encounter diagnoses.  Plan of Care  Problem-specific:  No problem-specific Assessment & Plan notes found for this encounter.  Mr. Lilburn Straw [name] a current medication list which includes the following long-term medication(s): azelastine hcl, carvedilol, and gabapentin.  Pharmacotherapy (Medications Ordered): No orders of the defined types were placed in this encounter.  Orders:  No orders of the defined types were placed in this encounter.  Follow-up plan:   No follow-ups on file.     Interventional Therapies  Risk  Complexity Considerations:   Estimated body mass index is 34.3 kg/m as calculated from the following:   Height as of this encounter: '6\' 1"'$  (1.854 m).   Weight as of this encounter: 260 lb (117.9 kg).  Risks: Thrombocytopenia    Planned  Pending:   Palliative right lumbar facet RFA #4 (next)  Therapeutic left lumbar facet RFA #4 (04/21/2022)    Under consideration:      Completed:   Therapeutic left lumbar facet (MB L2-S1) RFA #3 (04/01/2016, 04/05/2018, 10/24/2019) (53/70/70/50/50)  Therapeutic right lumbar facet (MB L2-S1) RFA #3 (12/24/2015, 02/17/2018, 11/16/2019) (50/80/80/40/50)    Completed by other providers:   N/a   Therapeutic  Palliative (PRN) options:   None established    Pharmacological Recommendations:   Medication management by Alysia Penna, MD      Recent Visits Date Type Provider Dept  05/07/22 Procedure visit Milinda Pointer, [facility]  04/21/22 Procedure visit  Milinda Pointer, MD [facility]  04/06/22 Office Visit Milinda Pointer, MD [facility]  Showing recent visits within past 90 days and meeting all other requirements Future Appointments No visits were found meeting these conditions. Showing future appointments within next 90 days and meeting all other requirements  I discussed the assessment and treatment plan with the patient. The patient was provided an opportunity to ask questions and all were answered. The patient agreed with the plan and demonstrated an understanding of the instructions.  Patient advised to call back or seek an in-person evaluation if the symptoms or condition worsens.  Duration of encounter: *** minutes.  Note by: Gaspar Cola, MD Date: 06/18/2022; Time: 3:33 PM

## 2022-06-18 ENCOUNTER — Ambulatory Visit: Payer: Medicare Other | Attending: Pain Medicine | Admitting: Pain Medicine

## 2022-06-18 DIAGNOSIS — G894 Chronic pain syndrome: Secondary | ICD-10-CM

## 2022-06-18 DIAGNOSIS — M47816 Spondylosis without myelopathy or radiculopathy, lumbar region: Secondary | ICD-10-CM | POA: Diagnosis not present

## 2022-06-18 DIAGNOSIS — G8929 Other chronic pain: Secondary | ICD-10-CM | POA: Diagnosis not present

## 2022-06-18 DIAGNOSIS — M545 Low back pain, unspecified: Secondary | ICD-10-CM | POA: Diagnosis not present

## 2022-07-02 DIAGNOSIS — E119 Type 2 diabetes mellitus without complications: Secondary | ICD-10-CM | POA: Diagnosis not present

## 2022-07-02 DIAGNOSIS — D696 Thrombocytopenia, unspecified: Secondary | ICD-10-CM | POA: Diagnosis not present

## 2022-07-02 DIAGNOSIS — K7581 Nonalcoholic steatohepatitis (NASH): Secondary | ICD-10-CM | POA: Diagnosis not present

## 2022-07-02 DIAGNOSIS — Z0000 Encounter for general adult medical examination without abnormal findings: Secondary | ICD-10-CM | POA: Diagnosis not present

## 2022-07-02 DIAGNOSIS — E669 Obesity, unspecified: Secondary | ICD-10-CM | POA: Diagnosis not present

## 2022-07-10 ENCOUNTER — Encounter: Payer: Self-pay | Admitting: Registered Nurse

## 2022-07-10 ENCOUNTER — Encounter: Payer: Medicare Other | Attending: Physical Medicine & Rehabilitation | Admitting: Registered Nurse

## 2022-07-10 VITALS — BP 112/57 | HR 57 | Ht 73.0 in | Wt 279.6 lb

## 2022-07-10 DIAGNOSIS — G894 Chronic pain syndrome: Secondary | ICD-10-CM

## 2022-07-10 DIAGNOSIS — Z79891 Long term (current) use of opiate analgesic: Secondary | ICD-10-CM

## 2022-07-10 DIAGNOSIS — M51369 Other intervertebral disc degeneration, lumbar region without mention of lumbar back pain or lower extremity pain: Secondary | ICD-10-CM

## 2022-07-10 DIAGNOSIS — M961 Postlaminectomy syndrome, not elsewhere classified: Secondary | ICD-10-CM

## 2022-07-10 DIAGNOSIS — Z5181 Encounter for therapeutic drug level monitoring: Secondary | ICD-10-CM | POA: Diagnosis not present

## 2022-07-10 DIAGNOSIS — M5136 Other intervertebral disc degeneration, lumbar region: Secondary | ICD-10-CM | POA: Insufficient documentation

## 2022-07-10 DIAGNOSIS — M5416 Radiculopathy, lumbar region: Secondary | ICD-10-CM

## 2022-07-10 DIAGNOSIS — R001 Bradycardia, unspecified: Secondary | ICD-10-CM | POA: Diagnosis not present

## 2022-07-10 MED ORDER — MORPHINE SULFATE ER 30 MG PO TBCR: 30.0000 mg | EXTENDED_RELEASE_TABLET | Freq: Two times a day (BID) | ORAL | 0 refills | Status: DC

## 2022-07-10 MED ORDER — OXYCODONE HCL 10 MG PO TABS: 10.0000 mg | ORAL_TABLET | Freq: Four times a day (QID) | ORAL | 0 refills | Status: DC | PRN

## 2022-07-10 NOTE — Progress Notes (Signed)
Subjective:    Patient ID: Samuel [name], male    DOB: [birth year], 48 y.o.   MRN: [id number]  HPI: Samuel [name] [name] a 47 y.o. male who returns for follow up appointment for chronic pain and medication refill. He states his pain [name] located in his lower back radiating into his bilateral hips. He rates his pain 6. His current exercise regime [name] walking and performing stretching exercises.  Samuel [name] 120.00 MME.   Last UDS was Performed on 06/09/2022, it was consistent.    Pain Inventory Average Pain 5 Pain Right Now 6 My pain [name] constant, sharp, burning, dull, stabbing, and aching  In the last 24 hours, has pain interfered with the following? General activity 9 Relation with others 9 Enjoyment of life 9 What TIME of day [name] your pain at its worst? morning , evening, and night Sleep (in general) Fair  Pain [name] worse with: walking, bending, sitting, inactivity, and standing Pain improves with: rest and medication Relief from Meds: 3  Family History  Problem Relation Age of Onset   Hypertension Father    Social History   Socioeconomic History   Marital status: Married    Spouse name: Not on file   Number of children: Not on file   Years of education: Not on file   Highest education level: Not on file  Occupational History   Not on file  Tobacco Use   Smoking status: Never   Smokeless tobacco: Never  Vaping Use   Vaping Use: Never used  Substance and Sexual Activity   Alcohol use: No    Alcohol/week: 0.0 standard drinks of alcohol   Drug use: No   Sexual activity: Not on file  Other Topics Concern   Not on file  Social History Narrative   Not on file   Social Determinants of Health   Financial Resource Strain: Not on file  Food Insecurity: Not on file  Transportation Needs: Not on file  Physical Activity: Not on file  Stress: Not on file  Social Connections: Not on file   Past Surgical History:  Procedure Laterality Date   BACK  SURGERY     lower back surgery x 2   Past Surgical History:  Procedure Laterality Date   BACK SURGERY     lower back surgery x 2   Past Medical History:  Diagnosis Date   Arthritis    "some in my back"   GERD (gastroesophageal reflux disease)    Headache(784.0)    There were no vitals taken for this visit.  Opioid Risk Score:   Fall Risk Score:  `1  Depression screen [facility] 2/9     05/08/2022    9:52 AM 05/07/2022    7:59 AM 04/07/2022    9:58 AM 04/06/2022    8:24 AM 01/09/2022    9:55 AM 12/08/2021    9:10 AM 10/14/2021    9:30 AM  Depression screen PHQ 2/9  Decreased Interest 0 0 1 0 1 0 0  Down, Depressed, Hopeless 0 0 1 0 1 0 1  PHQ - 2 Score 0 0 2 0 2 0 1      Review of Systems  Musculoskeletal:  Positive for back pain.       Bilateral hip pain  All other systems reviewed and are negative.      Objective:   Physical Exam Vitals and nursing note reviewed.  Constitutional:      Appearance: Normal appearance.  Cardiovascular:     Rate and Rhythm: Normal rate and regular rhythm.     Pulses: Normal pulses.     Heart sounds: Normal heart sounds.  Pulmonary:     Effort: Pulmonary effort [name] normal.     Breath sounds: Normal breath sounds.  Musculoskeletal:     Cervical back: Normal range of motion and neck supple.     Comments: Normal Muscle Bulk and Muscle Testing Reveals:  Upper Extremities: Decreased ROM 90 Degrees  and Muscle Strength 5/5 Lumbar Hypersensitivity Left Greater Trochanter Bursitis Lower Extremities: Decreased ROM and Muscle Strength 5/5 Bilateral Lower Extremities Flexion Produces Pain into his Lumbar Arises from Table slowly Antalgic  Gait     Skin:    General: Skin [name] warm and dry.  Neurological:     Mental Status: He [name] alert and oriented to person, place, and time.  Psychiatric:        Mood and Affect: Mood normal.        Behavior: Behavior normal.         Assessment & Plan:  1.  Lumbar postlaminectomy syndrome with  chronic low back and radicular pain. He has lumbar degenerative disc disease. Continue current medication regimen with  Gabapentin, Continue current Medication regimen . S/P  Left  Lumbar Facet Radiofrequency Ablation with sedation, on 04/21/2022 by Dr. Dossie Arbour with good results noted and S/P Right RFA with Dr. Lowella Dandy on 12/14,2023, with good relief noted.  Refilled: MS Contin 30 mg one tablet every 12 hours  #60 and Oxycodone 10 mg one tablet every 6 hours as needed for pain #120. We will continue the opioid monitoring program, this consists of regular clinic visits, examinations, urine drug screen, pill counts as well as use of [place] Controlled Substance Reporting system. A 12 month History has been reviewed on the [place] on 07/10/2022. 2. Sacroiliac Dysfunction: Continue with current medication, heat and exercise regime. 07/10/2022 3. Muscle Spasm: Continue Tizanidine as needed. Continue to Monitor.07/10/2022 4. Chronic Pain Syndrome: Continue MS Contin and Oxycodone as needed for pain. Continue to Monitor. 07/10/2022. 5. Left Greater Trochanteric Tenderness: Left Hip Osteoarthritis: No complaints today.  S/P  left intra-articular hip injection  with 8 days of relief Samuel [name] reports. Continue with alternating Ice and Heat Therapy. Continue to monitor. 07/10/2022. 6. Bradycardia: Heart Rate [name] 57 today. He's prescribed Coreg and his  PCP and Gastroenterologist Following he states. We will continue to monitor.07/10/2022 7. Weight Loss: Gastroenterologist Following. Samuel [name] states he was instructed to lose weight by his gastroenterologist , he [name] following a healthy diet regime and exercise. We will continue to monitor. 07/10/2022.   F/U in 1 month

## 2022-07-21 ENCOUNTER — Other Ambulatory Visit: Payer: Self-pay | Admitting: Physical Medicine & Rehabilitation

## 2022-07-21 DIAGNOSIS — Z23 Encounter for immunization: Secondary | ICD-10-CM | POA: Diagnosis not present

## 2022-07-21 DIAGNOSIS — K7581 Nonalcoholic steatohepatitis (NASH): Secondary | ICD-10-CM | POA: Diagnosis not present

## 2022-07-21 DIAGNOSIS — K766 Portal hypertension: Secondary | ICD-10-CM | POA: Diagnosis not present

## 2022-07-21 DIAGNOSIS — Z1159 Encounter for screening for other viral diseases: Secondary | ICD-10-CM | POA: Diagnosis not present

## 2022-07-21 DIAGNOSIS — R162 Hepatomegaly with splenomegaly, not elsewhere classified: Secondary | ICD-10-CM | POA: Diagnosis not present

## 2022-07-21 DIAGNOSIS — K7689 Other specified diseases of liver: Secondary | ICD-10-CM | POA: Diagnosis not present

## 2022-07-21 DIAGNOSIS — Z7289 Other problems related to lifestyle: Secondary | ICD-10-CM | POA: Diagnosis not present

## 2022-07-21 DIAGNOSIS — R932 Abnormal findings on diagnostic imaging of liver and biliary tract: Secondary | ICD-10-CM | POA: Diagnosis not present

## 2022-07-21 DIAGNOSIS — K7460 Unspecified cirrhosis of liver: Secondary | ICD-10-CM | POA: Diagnosis not present

## 2022-07-21 DIAGNOSIS — I8510 Secondary esophageal varices without bleeding: Secondary | ICD-10-CM | POA: Diagnosis not present

## 2022-07-21 DIAGNOSIS — Z6836 Body mass index (BMI) 36.0-36.9, adult: Secondary | ICD-10-CM | POA: Diagnosis not present

## 2022-07-21 DIAGNOSIS — E669 Obesity, unspecified: Secondary | ICD-10-CM | POA: Diagnosis not present

## 2022-08-05 ENCOUNTER — Other Ambulatory Visit: Payer: Self-pay | Admitting: Registered Nurse

## 2022-08-07 ENCOUNTER — Encounter: Payer: Medicare Other | Attending: Physical Medicine & Rehabilitation | Admitting: Registered Nurse

## 2022-08-07 ENCOUNTER — Encounter: Payer: Self-pay | Admitting: Registered Nurse

## 2022-08-07 VITALS — BP 119/67 | HR 54 | Ht 73.0 in | Wt 280.0 lb

## 2022-08-07 DIAGNOSIS — M5416 Radiculopathy, lumbar region: Secondary | ICD-10-CM | POA: Insufficient documentation

## 2022-08-07 DIAGNOSIS — M961 Postlaminectomy syndrome, not elsewhere classified: Secondary | ICD-10-CM | POA: Diagnosis not present

## 2022-08-07 DIAGNOSIS — M5136 Other intervertebral disc degeneration, lumbar region: Secondary | ICD-10-CM | POA: Insufficient documentation

## 2022-08-07 DIAGNOSIS — R001 Bradycardia, unspecified: Secondary | ICD-10-CM | POA: Diagnosis not present

## 2022-08-07 DIAGNOSIS — G894 Chronic pain syndrome: Secondary | ICD-10-CM | POA: Diagnosis not present

## 2022-08-07 DIAGNOSIS — Z79891 Long term (current) use of opiate analgesic: Secondary | ICD-10-CM | POA: Diagnosis not present

## 2022-08-07 DIAGNOSIS — Z5181 Encounter for therapeutic drug level monitoring: Secondary | ICD-10-CM | POA: Diagnosis not present

## 2022-08-07 MED ORDER — OXYCODONE HCL 10 MG PO TABS: 10.0000 mg | ORAL_TABLET | Freq: Four times a day (QID) | ORAL | 0 refills | Status: DC | PRN

## 2022-08-07 MED ORDER — MORPHINE SULFATE ER 30 MG PO TBCR: 30.0000 mg | EXTENDED_RELEASE_TABLET | Freq: Two times a day (BID) | ORAL | 0 refills | Status: DC

## 2022-08-07 MED ORDER — TIZANIDINE HCL 2 MG PO TABS: ORAL_TABLET | ORAL | 2 refills | Status: DC

## 2022-08-07 NOTE — Progress Notes (Signed)
Subjective:    Patient ID: Samuel [name], male    DOB: [birth year], 47 y.o.   MRN: [id number]  HPI: Samuel [name] is a 47 y.o. male who returns for follow up appointment for chronic pain and medication refill. He states his pain is located in his lower back radiating into his bilateral lower extremities. He rates his pain 6. His current exercise regime is walking and performing stretching exercises.  Samuel [name] equivalent is 120.00 MME.   Last UDS was Performed on 06/09/2022, it was consistent.    Pain Inventory Average Pain 6 Pain Right Now 6 My pain is constant, sharp, burning, dull, stabbing, and aching  In the last 24 hours, has pain interfered with the following? General activity 9 Relation with others 9 Enjoyment of life 9 What TIME of day is your pain at its worst? morning , evening, and night Sleep (in general) Fair  Pain is worse with: walking, bending, sitting, inactivity, and standing Pain improves with: rest and medication Relief from Meds: 5  Family History  Problem Relation Age of Onset   Hypertension Father    Social History   Socioeconomic History   Marital status: Married    Spouse name: Not on file   Number of children: Not on file   Years of education: Not on file   Highest education level: Not on file  Occupational History   Not on file  Tobacco Use   Smoking status: Never   Smokeless tobacco: Never  Vaping Use   Vaping Use: Never used  Substance and Sexual Activity   Alcohol use: No    Alcohol/week: 0.0 standard drinks of alcohol   Drug use: No   Sexual activity: Not on file  Other Topics Concern   Not on file  Social History Narrative   Not on file   Social Determinants of Health   Financial Resource Strain: Not on file  Food Insecurity: Not on file  Transportation Needs: Not on file  Physical Activity: Not on file  Stress: Not on file  Social Connections: Not on file   Past Surgical History:  Procedure Laterality  Date   BACK SURGERY     lower back surgery x 2   Past Surgical History:  Procedure Laterality Date   BACK SURGERY     lower back surgery x 2   Past Medical History:  Diagnosis Date   Arthritis    "some in my back"   GERD (gastroesophageal reflux disease)    Headache(784.0)    BP 119/67   Pulse (!) 58   Ht 6\' 1"  (1.854 m)   Wt 280 lb (127 kg)   SpO2 97%   BMI 36.94 kg/m   Opioid Risk Score:   Fall Risk Score:  `1  Depression screen [facility] 2/9     08/07/2022    9:45 AM 05/08/2022    9:52 AM 05/07/2022    7:59 AM 04/07/2022    9:58 AM 04/06/2022    8:24 AM 01/09/2022    9:55 AM 12/08/2021    9:10 AM  Depression screen PHQ 2/9  Decreased Interest 1 0 0 1 0 1 0  Down, Depressed, Hopeless 1 0 0 1 0 1 0  PHQ - 2 Score 2 0 0 2 0 2 0    Review of Systems  Constitutional: Negative.   HENT: Negative.    Eyes: Negative.   Respiratory: Negative.    Cardiovascular: Negative.   Gastrointestinal: Negative.  Endocrine: Negative.   Genitourinary: Negative.   Musculoskeletal:  Positive for back pain and gait problem.  Skin: Negative.   Allergic/Immunologic: Negative.   Hematological: Negative.   Psychiatric/Behavioral:  Positive for dysphoric mood.   All other systems reviewed and are negative.      Objective:   Physical Exam Vitals and nursing note reviewed.  Constitutional:      Appearance: Normal appearance.  Cardiovascular:     Rate and Rhythm: Normal rate and regular rhythm.     Pulses: Normal pulses.     Heart sounds: Normal heart sounds.  Pulmonary:     Effort: Pulmonary effort is normal.     Breath sounds: Normal breath sounds.  Musculoskeletal:     Cervical back: Normal range of motion and neck supple.     Comments: Normal Muscle Bulk and Muscle Testing Reveals:  Upper Extremities: Full ROM and Muscle Strength 5/5  Lumbar Hypersensitivity Lower Extremities: Decreased ROM and Muscle Strength 5/5 Bilateral Lower Extremities Flexion Produces Pain into his  bilateral lower extremities Arises from Table slowly using cane for support Antalgic  Gait     Skin:    General: Skin is warm and dry.  Neurological:     Mental Status: He is alert and oriented to person, place, and time.  Psychiatric:        Mood and Affect: Mood normal.        Behavior: Behavior normal.         Assessment & Plan:  1.  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 He has lumbar degenerative disc disease. Continue current medication regimen with  Gabapentin, Continue current Medication regimen . S/P  Left  Lumbar Facet Radiofrequency Ablation with sedation, on 04/21/2022 by Dr. Dossie Arbour with good results noted and S/P Right RFA with Dr. Lowella Dandy on 12/14,2023, with good relief noted.  Refilled: MS Contin 30 mg one tablet every 12 hours  #60 and Oxycodone 10 mg one tablet every 6 hours as needed for pain #120. We will continue the opioid monitoring program, this consists of regular clinic visits, examinations, urine drug screen, pill counts as well as use of [place] Controlled Substance Reporting system. A 12 month History has been reviewed on the [place] on 08/07/2022. 2. Sacroiliac Dysfunction: Continue with current medication, heat and exercise regime. 08/07/2022 3. Muscle Spasm: Continue Tizanidine as needed. Continue to Monitor.08/07/2022 4. Chronic Pain Syndrome: Continue MS Contin and Oxycodone as needed for pain. Continue to Monitor. 08/07/2022. 5. Left Greater Trochanteric Tenderness: Left Hip Osteoarthritis: No complaints today.  S/P  left intra-articular hip injection  with 8 days of relief Mr. Loggins reports. Continue with alternating Ice and Heat Therapy. Continue to monitor. 08/07/2022. 6. Bradycardia: Heart Rate is 57 today. He's prescribed Coreg and his  PCP and Gastroenterologist Following he states. We will continue to monitor.08/07/2022 7. Weight Loss: Gastroenterologist Following.  Mr. Steffensmeier states he was instructed to lose weight by his gastroenterologist , he is following a healthy diet regime and exercise. We will continue to monitor. 08/07/2022.   F/U in 1 month

## 2022-08-10 ENCOUNTER — Other Ambulatory Visit: Payer: Self-pay | Admitting: Registered Nurse

## 2022-08-10 MED ORDER — TIZANIDINE HCL 2 MG PO TABS: ORAL_TABLET | ORAL | 2 refills | Status: DC

## 2022-09-09 ENCOUNTER — Encounter: Payer: Self-pay | Admitting: Registered Nurse

## 2022-09-09 ENCOUNTER — Encounter: Payer: Medicare Other | Attending: Physical Medicine & Rehabilitation | Admitting: Registered Nurse

## 2022-09-09 VITALS — BP 125/74 | HR 58 | Ht 73.0 in | Wt 277.0 lb

## 2022-09-09 DIAGNOSIS — M546 Pain in thoracic spine: Secondary | ICD-10-CM | POA: Diagnosis not present

## 2022-09-09 DIAGNOSIS — R001 Bradycardia, unspecified: Secondary | ICD-10-CM | POA: Diagnosis not present

## 2022-09-09 DIAGNOSIS — M5116 Intervertebral disc disorders with radiculopathy, lumbar region: Secondary | ICD-10-CM | POA: Insufficient documentation

## 2022-09-09 DIAGNOSIS — Z8249 Family history of ischemic heart disease and other diseases of the circulatory system: Secondary | ICD-10-CM | POA: Insufficient documentation

## 2022-09-09 DIAGNOSIS — M5416 Radiculopathy, lumbar region: Secondary | ICD-10-CM

## 2022-09-09 DIAGNOSIS — Y92009 Unspecified place in unspecified non-institutional (private) residence as the place of occurrence of the external cause: Secondary | ICD-10-CM | POA: Diagnosis not present

## 2022-09-09 DIAGNOSIS — M62838 Other muscle spasm: Secondary | ICD-10-CM | POA: Insufficient documentation

## 2022-09-09 DIAGNOSIS — G894 Chronic pain syndrome: Secondary | ICD-10-CM | POA: Diagnosis not present

## 2022-09-09 DIAGNOSIS — M5136 Other intervertebral disc degeneration, lumbar region: Secondary | ICD-10-CM | POA: Diagnosis not present

## 2022-09-09 DIAGNOSIS — Z79891 Long term (current) use of opiate analgesic: Secondary | ICD-10-CM | POA: Diagnosis not present

## 2022-09-09 DIAGNOSIS — Z5181 Encounter for therapeutic drug level monitoring: Secondary | ICD-10-CM | POA: Diagnosis not present

## 2022-09-09 DIAGNOSIS — M7062 Trochanteric bursitis, left hip: Secondary | ICD-10-CM | POA: Diagnosis not present

## 2022-09-09 DIAGNOSIS — M533 Sacrococcygeal disorders, not elsewhere classified: Secondary | ICD-10-CM | POA: Insufficient documentation

## 2022-09-09 DIAGNOSIS — Z760 Encounter for issue of repeat prescription: Secondary | ICD-10-CM | POA: Diagnosis not present

## 2022-09-09 DIAGNOSIS — M1612 Unilateral primary osteoarthritis, left hip: Secondary | ICD-10-CM | POA: Insufficient documentation

## 2022-09-09 DIAGNOSIS — W19XXXD Unspecified fall, subsequent encounter: Secondary | ICD-10-CM

## 2022-09-09 DIAGNOSIS — M961 Postlaminectomy syndrome, not elsewhere classified: Secondary | ICD-10-CM

## 2022-09-09 DIAGNOSIS — M7061 Trochanteric bursitis, right hip: Secondary | ICD-10-CM

## 2022-09-09 MED ORDER — OXYCODONE HCL 10 MG PO TABS: 10.0000 mg | ORAL_TABLET | Freq: Four times a day (QID) | ORAL | 0 refills | Status: DC | PRN

## 2022-09-09 MED ORDER — MORPHINE SULFATE ER 30 MG PO TBCR: 30.0000 mg | EXTENDED_RELEASE_TABLET | Freq: Two times a day (BID) | ORAL | 0 refills | Status: DC

## 2022-09-09 NOTE — Progress Notes (Signed)
Subjective:    Patient ID: Samuel [name], male    DOB: Nov 19, 1975, 47 y.o.   MRN: [id number]  HPI: [name] is a 47 y.o. male who returns for follow up appointment for chronic pain and medication refill. He states his pain is located in his mid - back mainly right side ( since fall 2 weeks ago), lower back pain radiating into her bilateral lower extremities and bilateral hip pain L>R. He rates his pain 6. His current exercise regime is walking and performing stretching exercises.  Mr. Alires reports he was walking in his yard two weeks ago, he lost his balance and gell. He landed on his left side, he was able to pick himself up. He didn't seek medical attention.   Mr. Winnie Morphine equivalent is 120.00 MME.   Last UDS was Performed on 06/09/2022, it was consistent.    Pain Inventory Average Pain 6 Pain Right Now 6 My pain is constant, sharp, dull, stabbing, and aching  In the last 24 hours, has pain interfered with the following? General activity 9 Relation with others 9 Enjoyment of life 9 What TIME of day is your pain at its worst? morning , evening, and night Sleep (in general) Fair  Pain is worse with: walking, bending, sitting, inactivity, and standing Pain improves with: rest and medication Relief from Meds: 5  Family History  Problem Relation Age of Onset   Hypertension Father    Social History   Socioeconomic History   Marital status: Married    Spouse name: Not on file   Number of children: Not on file   Years of education: Not on file   Highest education level: Not on file  Occupational History   Not on file  Tobacco Use   Smoking status: Never   Smokeless tobacco: Never  Vaping Use   Vaping Use: Never used  Substance and Sexual Activity   Alcohol use: No    Alcohol/week: 0.0 standard drinks of alcohol   Drug use: No   Sexual activity: Not on file  Other Topics Concern   Not on file  Social History Narrative   Not on file   Social  Determinants of Health   Financial Resource Strain: Not on file  Food Insecurity: Not on file  Transportation Needs: Not on file  Physical Activity: Not on file  Stress: Not on file  Social Connections: Not on file   Past Surgical History:  Procedure Laterality Date   BACK SURGERY     lower back surgery x 2   Past Surgical History:  Procedure Laterality Date   BACK SURGERY     lower back surgery x 2   Past Medical History:  Diagnosis Date   Arthritis    "some in my back"   GERD (gastroesophageal reflux disease)    Headache(784.0)    There were no vitals taken for this visit.  Opioid Risk Score:   Fall Risk Score:  `1  Depression screen [facility] 2/9     08/07/2022    9:45 AM 05/08/2022    9:52 AM 05/07/2022    7:59 AM 04/07/2022    9:58 AM 04/06/2022    8:24 AM 01/09/2022    9:55 AM 12/08/2021    9:10 AM  Depression screen PHQ 2/9  Decreased Interest 1 0 0 1 0 1 0  Down, Depressed, Hopeless 1 0 0 1 0 1 0  PHQ - 2 Score 2 0 0 2 0 2 0  Review of Systems  Musculoskeletal:  Positive for back pain.       B/L leg and hip pain  All other systems reviewed and are negative.      Objective:   Physical Exam Vitals and nursing note reviewed.  Constitutional:      Appearance: Normal appearance.  Cardiovascular:     Rate and Rhythm: Normal rate and regular rhythm.     Pulses: Normal pulses.     Heart sounds: Normal heart sounds.  Pulmonary:     Effort: Pulmonary effort is normal.     Breath sounds: Normal breath sounds.  Musculoskeletal:     Cervical back: Normal range of motion and neck supple.     Comments: Normal Muscle Bulk and Muscle Testing Reveals:  Upper Extremities: Full ROM and Muscle Strength 5/5 Thoracic Paraspinal Tenderness: T-7-T-9_ Mainly Left Side  Lumbar Hypersensitivity Lower Extremities: Decreased  ROM and Muscle Strength 5/5 Bilateral Lower Extremities Flexion Produces Pain into her Lumbar Arises from Table slowly Antalgic Gait      Skin:    General: Skin is warm and dry.  Neurological:     Mental Status: He is alert and oriented to person, place, and time.  Psychiatric:        Mood and Affect: Mood normal.        Behavior: Behavior normal.           Assessment & Plan:  1.  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 He has lumbar degenerative disc disease. Continue current medication regimen with  Gabapentin, Continue current Medication regimen . S/P  Left  Lumbar Facet Radiofrequency Ablation with sedation, on 04/21/2022 by Dr. Laban Emperor with good results noted and S/P Right RFA with Dr. Shauna Hugh on 12/14,2023, with good relief noted.  Refilled: MS Contin 30 mg one tablet every 12 hours  #60 and Oxycodone 10 mg one tablet every 6 hours as needed for pain #120. We will continue the opioid monitoring program, this consists of regular clinic visits, examinations, urine drug screen, pill counts as well as use of [place] Controlled Substance Reporting system. A 12 month History has been reviewed on the [place] Controlled Substance Reporting System on 09/09/2022. 2. Sacroiliac Dysfunction: Continue with current medication, heat and exercise regime. 09/09/2022 3. Muscle Spasm: Continue Tizanidine as needed. Continue to Monitor.09/09/2022 4. Chronic Pain Syndrome: Continue MS Contin and Oxycodone as needed for pain. Continue to Monitor. 09/09/2022. 5. Left Greater Trochanteric Tenderness: Left Hip Osteoarthritis: No complaints today.  S/P  left intra-articular hip injection  with 8 days of relief Mr. Livingstone reports. Continue with alternating Ice and Heat Therapy. Continue to monitor. 09/09/2022. 6. Bradycardia: Heart Rate is 58 today. He's prescribed Coreg and his  PCP and Gastroenterologist Following he states. We will continue to monitor.09/09/2022 7. Weight Loss: Gastroenterologist Following. Mr. Coppa states he was instructed to lose weight by his gastroenterologist , he is following  a healthy diet regime and exercise. We will continue to monitor. 09/09/2022.   F/U in 1 month

## 2022-10-07 ENCOUNTER — Encounter: Payer: Medicare Other | Attending: Physical Medicine & Rehabilitation | Admitting: Registered Nurse

## 2022-10-07 ENCOUNTER — Encounter: Payer: Self-pay | Admitting: Registered Nurse

## 2022-10-07 VITALS — BP 108/69 | HR 60 | Ht 73.0 in | Wt 276.0 lb

## 2022-10-07 DIAGNOSIS — M5136 Other intervertebral disc degeneration, lumbar region: Secondary | ICD-10-CM

## 2022-10-07 DIAGNOSIS — M7062 Trochanteric bursitis, left hip: Secondary | ICD-10-CM | POA: Diagnosis not present

## 2022-10-07 DIAGNOSIS — G894 Chronic pain syndrome: Secondary | ICD-10-CM

## 2022-10-07 DIAGNOSIS — M7061 Trochanteric bursitis, right hip: Secondary | ICD-10-CM | POA: Diagnosis not present

## 2022-10-07 DIAGNOSIS — R001 Bradycardia, unspecified: Secondary | ICD-10-CM

## 2022-10-07 DIAGNOSIS — Z5181 Encounter for therapeutic drug level monitoring: Secondary | ICD-10-CM | POA: Diagnosis not present

## 2022-10-07 DIAGNOSIS — M5416 Radiculopathy, lumbar region: Secondary | ICD-10-CM | POA: Diagnosis not present

## 2022-10-07 DIAGNOSIS — Z79891 Long term (current) use of opiate analgesic: Secondary | ICD-10-CM | POA: Diagnosis not present

## 2022-10-07 DIAGNOSIS — M961 Postlaminectomy syndrome, not elsewhere classified: Secondary | ICD-10-CM | POA: Diagnosis not present

## 2022-10-07 MED ORDER — OXYCODONE HCL 10 MG PO TABS: 10.0000 mg | ORAL_TABLET | Freq: Four times a day (QID) | ORAL | 0 refills | Status: DC | PRN

## 2022-10-07 MED ORDER — MORPHINE SULFATE ER 30 MG PO TBCR: 30.0000 mg | EXTENDED_RELEASE_TABLET | Freq: Two times a day (BID) | ORAL | 0 refills | Status: DC

## 2022-10-07 NOTE — Progress Notes (Signed)
Subjective:    Patient ID: Samuel [name], male    DOB: [birth year], 47 y.o.   MRN: [id number]  HPI: Samuel [name] is a 47 y.o. male who returns for follow up appointment for chronic pain and medication refill. He states his pain is located in his Lower back pain radiating bilateral hips and bilateral lower extremities. He rates his pain 6. His current exercise regime is walking and performing stretching exercises.  [name] Morphine equivalent is 120.00 MME.  Last UDS was Performed on 06/09/2022, it was consistent.     Pain Inventory Average Pain 6 Pain Right Now 6 My pain is constant, sharp, burning, dull, stabbing, tingling, and aching  In the last 24 hours, has pain interfered with the following? General activity 9 Relation with others 9 Enjoyment of life 9 What TIME of day is your pain at its worst? morning , evening, and night Sleep (in general) Fair  Pain is worse with: walking, bending, sitting, inactivity, and standing Pain improves with: rest and medication Relief from Meds: 5  Family History  Problem Relation Age of Onset   Hypertension Father    Social History   Socioeconomic History   Marital status: Married    Spouse name: Not on file   Number of children: Not on file   Years of education: Not on file   Highest education level: Not on file  Occupational History   Not on file  Tobacco Use   Smoking status: Never   Smokeless tobacco: Never  Vaping Use   Vaping Use: Never used  Substance and Sexual Activity   Alcohol use: No    Alcohol/week: 0.0 standard drinks of alcohol   Drug use: No   Sexual activity: Not on file  Other Topics Concern   Not on file  Social History Narrative   Not on file   Social Determinants of Health   Financial Resource Strain: Not on file  Food Insecurity: Not on file  Transportation Needs: Not on file  Physical Activity: Not on file  Stress: Not on file  Social Connections: Not on file   Past Surgical  History:  Procedure Laterality Date   BACK SURGERY     lower back surgery x 2   Past Surgical History:  Procedure Laterality Date   BACK SURGERY     lower back surgery x 2   Past Medical History:  Diagnosis Date   Arthritis    "some in my back"   GERD (gastroesophageal reflux disease)    Headache(784.0)    Pulse (!) 59   Ht 6\' 1"  (1.854 m)   Wt 276 lb (125.2 kg)   SpO2 96%   BMI 36.41 kg/m   Opioid Risk Score:   Fall Risk Score:  `1  Depression screen [facility] 2/9     08/07/2022    9:45 AM 05/08/2022    9:52 AM 05/07/2022    7:59 AM 04/07/2022    9:58 AM 04/06/2022    8:24 AM 01/09/2022    9:55 AM 12/08/2021    9:10 AM  Depression screen PHQ 2/9  Decreased Interest 1 0 0 1 0 1 0  Down, Depressed, Hopeless 1 0 0 1 0 1 0  PHQ - 2 Score 2 0 0 2 0 2 0     Review of Systems  Musculoskeletal:  Positive for back pain.       B/L leg and hip pain  All other systems reviewed and are negative.  Objective:   Physical Exam Vitals and nursing note reviewed.  Constitutional:      Appearance: Normal appearance.  Cardiovascular:     Rate and Rhythm: Normal rate and regular rhythm.     Pulses: Normal pulses.     Heart sounds: Normal heart sounds.  Pulmonary:     Effort: Pulmonary effort is normal.     Breath sounds: Normal breath sounds.  Musculoskeletal:     Cervical back: Normal range of motion and neck supple.     Comments: Normal Muscle Bulk and Muscle Testing Reveals:  Upper Extremities: Full ROM and Muscle Strength 5/5 Lumbar Hypersensitivity Lower Extremities: Full ROM and Muscle Strength 5/5 Arises from Table slowly using cane for support Antalgic  Gait     Skin:    General: Skin is warm and dry.  Neurological:     Mental Status: He is alert and oriented to person, place, and time.  Psychiatric:        Mood and Affect: Mood normal.        Behavior: Behavior normal.         Assessment & Plan:  1.  Lumbar postlaminectomy syndrome with chronic low  back and radicular pain. He has lumbar degenerative disc disease. Continue current medication regimen with  Gabapentin, Continue current Medication regimen . S/P  Left  Lumbar Facet Radiofrequency Ablation with sedation, on 04/21/2022 by Dr. Laban Emperor with good results noted and S/P Right RFA with Dr. Shauna Hugh on 12/14,2023, with good relief noted.  Refilled: MS Contin 30 mg one tablet every 12 hours  #60 and Oxycodone 10 mg one tablet every 6 hours as needed for pain #120. We will continue the opioid monitoring program, this consists of regular clinic visits, examinations, urine drug screen, pill counts as well as use of [place] Controlled Substance Reporting system. A 12 month History has been reviewed on the [place] Controlled Substance Reporting System on 10/07/2022. 2. Sacroiliac Dysfunction: Continue with current medication, heat and exercise regime. 10/07/2022 3. Muscle Spasm: Continue Tizanidine as needed. Continue to Monitor.10/07/2022 4. Chronic Pain Syndrome: Continue MS Contin and Oxycodone as needed for pain. Continue to Monitor. 10/07/2022. 5. Left Greater Trochanteric Tenderness: Left Hip Osteoarthritis: No complaints today.  S/P  left intra-articular hip injection  with 8 days of relief [name] reports. Continue with alternating Ice and Heat Therapy. Continue to monitor. 10/07/2022. 6. Bradycardia:Apical Pulse: Heart Rate is 60  today. He's prescribed Coreg and his  PCP and Gastroenterologist Following he states. We will continue to monitor.10/07/2022 7. Weight Loss: Gastroenterologist Following. [name] states he was instructed to lose weight by his gastroenterologist , he is following a healthy diet regime and exercise. We will continue to monitor. 10/07/2022.   F/U in 1 month

## 2022-10-08 DIAGNOSIS — D123 Benign neoplasm of transverse colon: Secondary | ICD-10-CM | POA: Diagnosis not present

## 2022-10-08 DIAGNOSIS — D128 Benign neoplasm of rectum: Secondary | ICD-10-CM | POA: Diagnosis not present

## 2022-10-08 DIAGNOSIS — Z1211 Encounter for screening for malignant neoplasm of colon: Secondary | ICD-10-CM | POA: Diagnosis not present

## 2022-11-04 ENCOUNTER — Encounter: Payer: Medicare Other | Attending: Physical Medicine & Rehabilitation | Admitting: Registered Nurse

## 2022-11-04 ENCOUNTER — Encounter: Payer: Self-pay | Admitting: Registered Nurse

## 2022-11-04 VITALS — BP 109/72 | HR 59 | Ht 73.0 in | Wt 273.0 lb

## 2022-11-04 DIAGNOSIS — M5136 Other intervertebral disc degeneration, lumbar region: Secondary | ICD-10-CM | POA: Insufficient documentation

## 2022-11-04 DIAGNOSIS — Z79891 Long term (current) use of opiate analgesic: Secondary | ICD-10-CM | POA: Diagnosis not present

## 2022-11-04 DIAGNOSIS — Z5181 Encounter for therapeutic drug level monitoring: Secondary | ICD-10-CM

## 2022-11-04 DIAGNOSIS — M5416 Radiculopathy, lumbar region: Secondary | ICD-10-CM | POA: Diagnosis not present

## 2022-11-04 DIAGNOSIS — M51369 Other intervertebral disc degeneration, lumbar region without mention of lumbar back pain or lower extremity pain: Secondary | ICD-10-CM

## 2022-11-04 DIAGNOSIS — G894 Chronic pain syndrome: Secondary | ICD-10-CM

## 2022-11-04 DIAGNOSIS — M961 Postlaminectomy syndrome, not elsewhere classified: Secondary | ICD-10-CM

## 2022-11-04 MED ORDER — OXYCODONE HCL 10 MG PO TABS: 10.0000 mg | ORAL_TABLET | Freq: Four times a day (QID) | ORAL | 0 refills | Status: DC | PRN

## 2022-11-04 MED ORDER — MORPHINE SULFATE ER 30 MG PO TBCR: 30.0000 mg | EXTENDED_RELEASE_TABLET | Freq: Two times a day (BID) | ORAL | 0 refills | Status: DC

## 2022-11-04 NOTE — Progress Notes (Signed)
Subjective:    Patient ID: Samuel [name], male    DOB: Dec 20, 1975, 47 y.o.   MRN: [id number]  HPI: Samuel [name] is a 47 y.o. male who returns for follow up appointment for chronic pain and medication refill. He states his pain is located in his lower back pain radiating into his bilateral lower extremities and left hip pain. He rates his pain 6. His current exercise regime is walking and performing stretching exercises.  Mr. Rusco Morphine equivalent is 120.00 MME.   UDS ordered today.   Apical Pulse: 60    Pain Inventory Average Pain 6 Pain Right Now 6 My pain is constant, sharp, dull, stabbing, and aching  In the last 24 hours, has pain interfered with the following? General activity 9 Relation with others 9 Enjoyment of life 9 What TIME of day is your pain at its worst? morning , evening, and night Sleep (in general) Fair  Pain is worse with: walking, bending, sitting, inactivity, and standing Pain improves with: rest and medication Relief from Meds: 5  Family History  Problem Relation Age of Onset   Hypertension Father    Social History   Socioeconomic History   Marital status: Married    Spouse name: Not on file   Number of children: Not on file   Years of education: Not on file   Highest education level: Not on file  Occupational History   Not on file  Tobacco Use   Smoking status: Never   Smokeless tobacco: Never  Vaping Use   Vaping Use: Never used  Substance and Sexual Activity   Alcohol use: No    Alcohol/week: 0.0 standard drinks of alcohol   Drug use: No   Sexual activity: Not on file  Other Topics Concern   Not on file  Social History Narrative   Not on file   Social Determinants of Health   Financial Resource Strain: Not on file  Food Insecurity: Not on file  Transportation Needs: Not on file  Physical Activity: Not on file  Stress: Not on file  Social Connections: Not on file   Past Surgical History:  Procedure Laterality  Date   BACK SURGERY     lower back surgery x 2   Past Surgical History:  Procedure Laterality Date   BACK SURGERY     lower back surgery x 2   Past Medical History:  Diagnosis Date   Arthritis    "some in my back"   GERD (gastroesophageal reflux disease)    Headache(784.0)    BP 109/72   Pulse (!) 59   Ht 6\' 1"  (1.854 m)   Wt 273 lb (123.8 kg)   SpO2 98%   BMI 36.02 kg/m   Opioid Risk Score:   Fall Risk Score:  `1  Depression screen [facility] 2/9     10/07/2022    9:14 AM 08/07/2022    9:45 AM 05/08/2022    9:52 AM 05/07/2022    7:59 AM 04/07/2022    9:58 AM 04/06/2022    8:24 AM 01/09/2022    9:55 AM  Depression screen PHQ 2/9  Decreased Interest 0 1 0 0 1 0 1  Down, Depressed, Hopeless 0 1 0 0 1 0 1  PHQ - 2 Score 0 2 0 0 2 0 2     Review of Systems  Musculoskeletal:  Positive for back pain.       Bilateral leg pain  All other systems reviewed and are  negative.     Objective:   Physical Exam Vitals and nursing note reviewed.  Constitutional:      Appearance: Normal appearance.  Cardiovascular:     Rate and Rhythm: Normal rate and regular rhythm.     Pulses: Normal pulses.     Heart sounds: Normal heart sounds.  Pulmonary:     Effort: Pulmonary effort is normal.     Breath sounds: Normal breath sounds.  Musculoskeletal:     Cervical back: Normal range of motion and neck supple.     Comments: Normal Muscle Bulk and Muscle Testing Reveals:  Upper Extremities: Full ROM and Muscle Strength 5/5 ,Lumbar Hypersensitivity Left Greater Trochanter Tenderness Lower Extremities: Decreased ROM and Muscle Strength 5/5 Bilateral Lower Extremities Flexion Produces Pain into his Lumbar  Arises from Table slowly  Antalgic Gait     Skin:    General: Skin is warm and dry.  Neurological:     Mental Status: He is alert and oriented to person, place, and time.  Psychiatric:        Mood and Affect: Mood normal.        Behavior: Behavior normal.         Assessment  & Plan:  1.  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 He has lumbar degenerative disc disease. Continue current medication regimen with  Gabapentin, Continue current Medication regimen . S/P  Left  Lumbar Facet Radiofrequency Ablation with sedation, on 04/21/2022 by Dr. Laban Emperor with good results noted and S/P Right RFA with Dr. Shauna Hugh on 12/14,2023, with good relief noted.  Refilled: MS Contin 30 mg one tablet every 12 hours  #60 and Oxycodone 10 mg one tablet every 6 hours as needed for pain #120. We will continue the opioid monitoring program, this consists of regular clinic visits, examinations, urine drug screen, pill counts as well as use of [place] Controlled Substance Reporting system. A 12 month History has been reviewed on the [place] Controlled Substance Reporting System on 11/04/2022. 2. Sacroiliac Dysfunction: Continue with current medication, heat and exercise regime. 11/04/2022 3. Muscle Spasm: Continue Tizanidine as needed. Continue to Monitor.11/04/2022 4. Chronic Pain Syndrome: Continue MS Contin and Oxycodone as needed for pain. Continue to Monitor. 11/04/2022. 5. Left Greater Trochanteric Tenderness: Left Hip Osteoarthritis:  S/P  left intra-articular hip injection  with 8 days of relief Mr. Hoadley reports. Continue with alternating Ice and Heat Therapy. Continue to monitor. 11/04/2022. 6. Bradycardia:Apical Pulse: Heart Rate is 60  today. He's prescribed Coreg and his  PCP and Gastroenterologist Following he states. We will continue to monitor.11/04/2022 7. Weight Loss: Gastroenterologist Following. Mr. Miron states he was instructed to lose weight by his gastroenterologist , he is following a healthy diet regime and exercise. We will continue to monitor. 11/04/2022.   F/U in 1 month

## 2022-11-10 LAB — TOXASSURE SELECT,+ANTIDEPR,UR

## 2022-12-03 ENCOUNTER — Encounter: Payer: Self-pay | Admitting: Registered Nurse

## 2022-12-03 ENCOUNTER — Encounter: Payer: Medicare Other | Attending: Physical Medicine & Rehabilitation | Admitting: Registered Nurse

## 2022-12-03 VITALS — BP 96/61 | HR 56 | Ht 73.0 in | Wt 265.0 lb

## 2022-12-03 DIAGNOSIS — M5416 Radiculopathy, lumbar region: Secondary | ICD-10-CM

## 2022-12-03 DIAGNOSIS — Z5181 Encounter for therapeutic drug level monitoring: Secondary | ICD-10-CM | POA: Diagnosis not present

## 2022-12-03 DIAGNOSIS — Z79891 Long term (current) use of opiate analgesic: Secondary | ICD-10-CM | POA: Diagnosis not present

## 2022-12-03 DIAGNOSIS — M961 Postlaminectomy syndrome, not elsewhere classified: Secondary | ICD-10-CM | POA: Diagnosis not present

## 2022-12-03 DIAGNOSIS — M5136 Other intervertebral disc degeneration, lumbar region: Secondary | ICD-10-CM | POA: Insufficient documentation

## 2022-12-03 DIAGNOSIS — M51369 Other intervertebral disc degeneration, lumbar region without mention of lumbar back pain or lower extremity pain: Secondary | ICD-10-CM

## 2022-12-03 DIAGNOSIS — G894 Chronic pain syndrome: Secondary | ICD-10-CM | POA: Diagnosis not present

## 2022-12-03 MED ORDER — MORPHINE SULFATE ER 30 MG PO TBCR: 30.0000 mg | EXTENDED_RELEASE_TABLET | Freq: Two times a day (BID) | ORAL | 0 refills | Status: DC

## 2022-12-03 MED ORDER — OXYCODONE HCL 10 MG PO TABS: 10.0000 mg | ORAL_TABLET | Freq: Four times a day (QID) | ORAL | 0 refills | Status: DC | PRN

## 2022-12-03 NOTE — Progress Notes (Signed)
Subjective:    Patient ID: Samuel [name], male    DOB: [birth year], 47 y.o.   MRN: [id number]  HPI: Samuel [name] is a 47 y.o. male who returns for follow up appointment for chronic pain and medication refill. He states his pain is located in his lower back pain radiating into his bilateral lower extremities and left hip. He rates his pain 6. His current exercise regime is walking and performing stretching exercises.  Samuel [name] arrived to office hypotensive, blood pressure was re-checked. He will keep a blood pressure log and call his PCP and Gastroenterologist. He is asymptomatic. Blood pressure checked again. Continue to monitor.   Samuel [name] is 120.00 MME.   Last UDS was Performed on 11/04/2022, it was consistent.    Pain Inventory Average Pain 6 Pain Right Now 6 My pain is constant, sharp, burning, dull, stabbing, and aching  In the last 24 hours, has pain interfered with the following? General activity 9 Relation with others 9 Enjoyment of life 9 What TIME of day is your pain at its worst? morning  and night Sleep (in general) Fair  Pain is worse with: walking, bending, sitting, inactivity, and standing Pain improves with: rest and medication Relief from Meds: 4  Family History  Problem Relation Age of Onset   Hypertension Father    Social History   Socioeconomic History   Marital status: Married    Spouse name: Not on file   Number of children: Not on file   Years of education: Not on file   Highest education level: Not on file  Occupational History   Not on file  Tobacco Use   Smoking status: Never   Smokeless tobacco: Never  Vaping Use   Vaping status: Never Used  Substance and Sexual Activity   Alcohol use: No    Alcohol/week: 0.0 standard drinks of alcohol   Drug use: No   Sexual activity: Not on file  Other Topics Concern   Not on file  Social History Narrative   Not on file   Social Determinants of Health   Financial  Resource Strain: High Risk (07/01/2022)   Received from [facility], [facility]   Overall Financial Resource Strain (CARDIA)    Difficulty of Paying Living Expenses: Hard  Food Insecurity: Food Insecurity Present (07/01/2022)   Received from [facility], [facility]   Hunger Vital Sign    Worried About Running Out of Food in the Last Year: Sometimes true    Ran Out of Food in the Last Year: Patient declined  Transportation Needs: No Transportation Needs (07/01/2022)   Received from [employer], [facility]   PRAPARE - Transportation    Lack of Transportation (Medical): No    Lack of Transportation (Non-Medical): No  Physical Activity: Inactive (07/01/2022)   Received from [facility], [facility]   Exercise Vital Sign    Days of Exercise per Week: 0 days    Minutes of Exercise per Session: 0 min  Stress: No Stress Concern Present (07/01/2022)   Received from [facility], [facility] - Occupational Stress Questionnaire    Feeling of Stress : Only a little  Social Connections: Moderately Integrated (07/01/2022)   Received from [facility], [facility]   Social Network    How would you rate your social network (family, work, friends)?: Adequate participation with social networks   Past Surgical History:  Procedure Laterality Date   BACK  SURGERY     lower back surgery x 2   Past Surgical History:  Procedure Laterality Date   BACK SURGERY     lower back surgery x 2   Past Medical History:  Diagnosis Date   Arthritis    "some in my back"   GERD (gastroesophageal reflux disease)    Headache(784.0)    BP (!) 84/52   Pulse (!) 56   Ht 6\' 1"  (1.854 m)   Wt 265 lb (120.2 kg)   SpO2 97%   BMI 34.96 kg/m   Opioid Risk Score:   Fall Risk Score:  `1  Depression screen [facility] 2/9     10/07/2022    9:14 AM 08/07/2022    9:45 AM 05/08/2022    9:52 AM 05/07/2022    7:59 AM 04/07/2022    9:58 AM 04/06/2022    8:24 AM  01/09/2022    9:55 AM  Depression screen PHQ 2/9  Decreased Interest 0 1 0 0 1 0 1  Down, Depressed, Hopeless 0 1 0 0 1 0 1  PHQ - 2 Score 0 2 0 0 2 0 2     Review of Systems  Musculoskeletal:  Positive for back pain.       Bilateral hip pain  All other systems reviewed and are negative.     Objective:   Physical Exam Vitals and nursing note reviewed.  Constitutional:      Appearance: Normal appearance.  Cardiovascular:     Rate and Rhythm: Normal rate and regular rhythm.     Pulses: Normal pulses.     Heart sounds: Normal heart sounds.  Pulmonary:     Effort: Pulmonary effort is normal.     Breath sounds: Normal breath sounds.  Musculoskeletal:     Cervical back: Normal range of motion and neck supple.     Comments: Normal Muscle Bulk and Muscle Testing Reveals:  Upper Extremities: Full ROM and Muscle Strength 5/5 Lumbar Hypersensitivity Left Greater Trochanter Tenderness Lower Extremities: Decreased ROM and Muscle Strength 5/5 Bilateral Lower Extremities Flexion Produces Pain into his Lumbar Arises from Table slowly using cane for support Antalgic  Gait     Skin:    General: Skin is warm and dry.  Neurological:     Mental Status: He is alert and oriented to person, place, and time.  Psychiatric:        Mood and Affect: Mood normal.        Behavior: Behavior normal.         Assessment & Plan:  1.  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 He has lumbar degenerative disc disease. Continue current medication regimen with  Gabapentin, Continue current Medication regimen . S/P  Left  Lumbar Facet Radiofrequency Ablation with sedation, on 04/21/2022 by Dr. Laban Emperor with good results noted and S/P Right RFA with Dr. Shauna Hugh on 12/14,2023, with good relief noted.  Refilled: MS Contin 30 mg one tablet every 12 hours  #60 and Oxycodone 10 mg one tablet every 6 hours as needed for pain #120. We will continue the opioid monitoring program, this  consists of regular clinic visits, examinations, urine drug screen, pill counts as well as use of [place] Controlled Substance Reporting system. A 12 month History has been reviewed on the [place] Controlled Substance Reporting System on 12/03/2022. 2. Sacroiliac Dysfunction: Continue with current medication, heat and exercise regime. 12/03/2022 3. Muscle Spasm: Continue Tizanidine as needed. Continue to Monitor.12/03/2022 4. Chronic  Pain Syndrome: Continue MS Contin and Oxycodone as needed for pain. Continue to Monitor. 12/03/2022. 5. Left Greater Trochanteric Tenderness: Left Hip Osteoarthritis:  S/P  left intra-articular hip injection  with 8 days of relief Samuel [name] reports. Continue with alternating Ice and Heat Therapy. Continue to monitor. 12/03/2022. 6. Bradycardia:Apical Pulse: Heart Rate is 56  today. He's prescribed Coreg and his  PCP and Gastroenterologist Following he states. We will continue to monitor.12/03/2022 7. Weight Loss: Gastroenterologist Following. Samuel [name] states he was instructed to lose weight by his gastroenterologist , he is following a healthy diet regime and exercise. We will continue to monitor. 12/03/2022. 8. Hypotensive upon arrival: Blood Pressure was re-checked twice. He will call his PCP and Gastroenterologist today regarding his Coreghe was instructed to keep blood pressure log and F/U with his PCP, he verbalizes understanding.   F/U in 1 month

## 2023-01-04 NOTE — Progress Notes (Unsigned)
Subjective:    Patient ID: Samuel [name], male    DOB: August 17, 1975, 47 y.o.   MRN: [id number]  [id number] [name] is a 47 y.o. male who returns for follow up appointment for chronic pain and medication refill. He states his pain is located in his lower back radiating into his bilateral hips and bilateral lower extremities. Marland KitchenHe rates his pain 6. His  current exercise regime is walking and performing stretching exercises.  Mr. Prall arrived bradycardia, apical pulse checked.   Mr. Etue Morphine equivalent is 120.00 MME.   Last UDS was Performed on 11/04/2022, it was consistent.      Pain Inventory Average Pain 5 Pain Right Now 6 My pain is constant, sharp, dull, stabbing, and aching  In the last 24 hours, has pain interfered with the following? General activity 9 Relation with others 9 Enjoyment of life 9 What TIME of day is your pain at its worst? morning , evening, and night Sleep (in general) Fair  Pain is worse with: walking, bending, sitting, inactivity, and standing Pain improves with: rest and medication Relief from Meds: 5  Family History  Problem Relation Age of Onset   Hypertension Father    Social History   Socioeconomic History   Marital status: Married    Spouse name: Not on file   Number of children: Not on file   Years of education: Not on file   Highest education level: Not on file  Occupational History   Not on file  Tobacco Use   Smoking status: Never   Smokeless tobacco: Never  Vaping Use   Vaping status: Never Used  Substance and Sexual Activity   Alcohol use: No    Alcohol/week: 0.0 standard drinks of alcohol   Drug use: No   Sexual activity: Not on file  Other Topics Concern   Not on file  Social History Narrative   Not on file   Social Determinants of Health   Financial Resource Strain: High Risk (07/01/2022)   Received from [facility], [facility]   Overall Financial Resource Strain (CARDIA)    Difficulty of Paying Living  Expenses: Hard  Food Insecurity: Food Insecurity Present (07/01/2022)   Received from [facility], [facility]   Hunger Vital Sign    Worried About Running Out of Food in the Last Year: Sometimes true    Ran Out of Food in the Last Year: Patient declined  Transportation Needs: No Transportation Needs (07/01/2022)   Received from [employer], [facility]   PRAPARE - Transportation    Lack of Transportation (Medical): No    Lack of Transportation (Non-Medical): No  Physical Activity: Unknown (07/01/2022)   Received from [facility]   Exercise Vital Sign    Days of Exercise per Week: 0 days    Minutes of Exercise per Session: Not on file  Recent Concern: Physical Activity - Inactive (07/01/2022)   Received from [facility], [facility]   Exercise Vital Sign    Days of Exercise per Week: 0 days    Minutes of Exercise per Session: 0 min  Stress: No Stress Concern Present (07/01/2022)   Received from [facility], [facility] of [facility]ational Health - Occupational Stress Questionnaire    Feeling of Stress : Only a little  Social Connections: Moderately Integrated (07/01/2022)   Received from [facility], [facility]   Social Network    How would you rate your social network (family, work, friends)?: Adequate participation  with social networks   Past Surgical History:  Procedure Laterality Date   BACK SURGERY     lower back surgery x 2   Past Surgical History:  Procedure Laterality Date   BACK SURGERY     lower back surgery x 2   Past Medical History:  Diagnosis Date   Arthritis    "some in my back"   GERD (gastroesophageal reflux disease)    Headache(784.0)    BP 104/70   Pulse (!) 58   Ht 6\' 1"  (1.854 m)   Wt 257 lb (116.6 kg)   SpO2 98%   BMI 33.91 kg/m   Opioid Risk Score:   Fall Risk Score:  `1  Depression screen [facility] 2/9     10/07/2022    9:14 AM 08/07/2022    9:45 AM 05/08/2022    9:52 AM 05/07/2022    7:59 AM 04/07/2022     9:58 AM 04/06/2022    8:24 AM 01/09/2022    9:55 AM  Depression screen PHQ 2/9  Decreased Interest 0 1 0 0 1 0 1  Down, Depressed, Hopeless 0 1 0 0 1 0 1  PHQ - 2 Score 0 2 0 0 2 0 2    Review of Systems  Musculoskeletal:  Positive for back pain and gait problem.       Pain in both hips & both legs       Objective:   Physical Exam Vitals and nursing note reviewed.  Constitutional:      Appearance: Normal appearance.  Cardiovascular:     Rate and Rhythm: Normal rate and regular rhythm.     Pulses: Normal pulses.     Heart sounds: Normal heart sounds.  Pulmonary:     Effort: Pulmonary effort is normal.     Breath sounds: Normal breath sounds.  Musculoskeletal:     Cervical back: Normal range of motion and neck supple.     Comments: Normal Muscle Bulk and Muscle Testing Reveals:  Upper Extremities: Full ROM and Muscle Strength 5/5 Lumbar Hypersensitivity Lower Extremities: Decreased ROM and Muscle Strength 5/5 Bilateral Lower extremities Flexion Produces Pain into his Lumbar Arises from Table slowly using can for support Antalgic  Gait     Skin:    General: Skin is warm and dry.  Neurological:     Mental Status: He is alert and oriented to person, place, and time.  Psychiatric:        Mood and Affect: Mood normal.        Behavior: Behavior normal.         Assessment & Plan:  1.  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 He has lumbar degenerative disc disease. Continue current medication regimen with  Gabapentin, Continue current Medication regimen . S/P  Left  Lumbar Facet Radiofrequency Ablation with sedation, on 04/21/2022 by Dr. Laban Emperor with good results noted and S/P Right RFA with Dr. Shauna Hugh on 12/14,2023, with good relief noted.  Refilled: MS Contin 30 mg one tablet every 12 hours  #60 and Oxycodone 10 mg one tablet every 6 hours as needed for pain #120. We will continue the opioid monitoring program, this consists of regular  clinic visits, examinations, urine drug screen, pill counts as well as use of [place] Controlled Substance Reporting system. A 12 month History has been reviewed on the [place] Controlled Substance Reporting System on 01/05/2023. 2. Sacroiliac Dysfunction: Continue with current medication, heat and exercise regime. 01/05/2023 3. Muscle  Spasm: Continue Tizanidine as needed. Continue to Monitor.01/05/2023 4. Chronic Pain Syndrome: Continue MS Contin and Oxycodone as needed for pain. Continue to Monitor. 01/05/2023. 5. Left Greater Trochanteric Tenderness: Left Hip Osteoarthritis:  S/P  left intra-articular hip injection  with 8 days of relief Mr. Bessett reports. Continue with alternating Ice and Heat Therapy. Continue to monitor. 01/05/2023. 6. Bradycardia:Apical Pulse: Heart Rate is 60 today. He's prescribed Coreg and his  PCP and Gastroenterologist Following he states. We will continue to monitor.01/05/2023 7. Weight Loss: Gastroenterologist Following. Mr. Gebremariam states he was instructed to lose weight by his gastroenterologist , he is following a healthy diet regime and exercise. We will continue to monitor. 01/05/2023.  F/U in 1 month

## 2023-01-05 ENCOUNTER — Encounter: Payer: Self-pay | Admitting: Registered Nurse

## 2023-01-05 ENCOUNTER — Encounter: Payer: Medicare Other | Attending: Physical Medicine & Rehabilitation | Admitting: Registered Nurse

## 2023-01-05 VITALS — BP 104/70 | HR 60 | Ht 73.0 in | Wt 257.0 lb

## 2023-01-05 DIAGNOSIS — M62830 Muscle spasm of back: Secondary | ICD-10-CM

## 2023-01-05 DIAGNOSIS — R5383 Other fatigue: Secondary | ICD-10-CM | POA: Diagnosis not present

## 2023-01-05 DIAGNOSIS — G894 Chronic pain syndrome: Secondary | ICD-10-CM | POA: Diagnosis not present

## 2023-01-05 DIAGNOSIS — R001 Bradycardia, unspecified: Secondary | ICD-10-CM

## 2023-01-05 DIAGNOSIS — Z5181 Encounter for therapeutic drug level monitoring: Secondary | ICD-10-CM

## 2023-01-05 DIAGNOSIS — Z79891 Long term (current) use of opiate analgesic: Secondary | ICD-10-CM | POA: Diagnosis not present

## 2023-01-05 DIAGNOSIS — E119 Type 2 diabetes mellitus without complications: Secondary | ICD-10-CM | POA: Diagnosis not present

## 2023-01-05 DIAGNOSIS — M51369 Other intervertebral disc degeneration, lumbar region without mention of lumbar back pain or lower extremity pain: Secondary | ICD-10-CM

## 2023-01-05 DIAGNOSIS — M961 Postlaminectomy syndrome, not elsewhere classified: Secondary | ICD-10-CM | POA: Diagnosis not present

## 2023-01-05 DIAGNOSIS — M5136 Other intervertebral disc degeneration, lumbar region: Secondary | ICD-10-CM | POA: Insufficient documentation

## 2023-01-05 DIAGNOSIS — M5416 Radiculopathy, lumbar region: Secondary | ICD-10-CM

## 2023-01-05 MED ORDER — MORPHINE SULFATE ER 30 MG PO TBCR: 30.0000 mg | EXTENDED_RELEASE_TABLET | Freq: Two times a day (BID) | ORAL | 0 refills | Status: DC

## 2023-01-05 MED ORDER — OXYCODONE HCL 10 MG PO TABS: 10.0000 mg | ORAL_TABLET | Freq: Four times a day (QID) | ORAL | 0 refills | Status: DC | PRN

## 2023-01-19 DIAGNOSIS — K769 Liver disease, unspecified: Secondary | ICD-10-CM | POA: Diagnosis not present

## 2023-01-19 DIAGNOSIS — R161 Splenomegaly, not elsewhere classified: Secondary | ICD-10-CM | POA: Diagnosis not present

## 2023-01-19 DIAGNOSIS — K7469 Other cirrhosis of liver: Secondary | ICD-10-CM | POA: Diagnosis not present

## 2023-01-19 DIAGNOSIS — K7581 Nonalcoholic steatohepatitis (NASH): Secondary | ICD-10-CM | POA: Diagnosis not present

## 2023-01-19 DIAGNOSIS — K7460 Unspecified cirrhosis of liver: Secondary | ICD-10-CM | POA: Diagnosis not present

## 2023-01-19 DIAGNOSIS — R5383 Other fatigue: Secondary | ICD-10-CM | POA: Diagnosis not present

## 2023-01-19 DIAGNOSIS — K829 Disease of gallbladder, unspecified: Secondary | ICD-10-CM | POA: Diagnosis not present

## 2023-01-19 DIAGNOSIS — K7689 Other specified diseases of liver: Secondary | ICD-10-CM | POA: Diagnosis not present

## 2023-02-10 ENCOUNTER — Encounter: Payer: Self-pay | Admitting: Registered Nurse

## 2023-02-10 ENCOUNTER — Encounter: Payer: Medicare Other | Attending: Physical Medicine & Rehabilitation | Admitting: Registered Nurse

## 2023-02-10 VITALS — BP 120/75 | HR 60 | Ht 73.0 in | Wt 252.0 lb

## 2023-02-10 DIAGNOSIS — Z5181 Encounter for therapeutic drug level monitoring: Secondary | ICD-10-CM | POA: Insufficient documentation

## 2023-02-10 DIAGNOSIS — M5416 Radiculopathy, lumbar region: Secondary | ICD-10-CM | POA: Insufficient documentation

## 2023-02-10 DIAGNOSIS — G894 Chronic pain syndrome: Secondary | ICD-10-CM | POA: Diagnosis not present

## 2023-02-10 DIAGNOSIS — Z79891 Long term (current) use of opiate analgesic: Secondary | ICD-10-CM | POA: Insufficient documentation

## 2023-02-10 DIAGNOSIS — M961 Postlaminectomy syndrome, not elsewhere classified: Secondary | ICD-10-CM | POA: Insufficient documentation

## 2023-02-10 DIAGNOSIS — M5136 Other intervertebral disc degeneration, lumbar region: Secondary | ICD-10-CM | POA: Diagnosis not present

## 2023-02-10 MED ORDER — MORPHINE SULFATE ER 30 MG PO TBCR: 30.0000 mg | EXTENDED_RELEASE_TABLET | Freq: Two times a day (BID) | ORAL | 0 refills | Status: DC

## 2023-02-10 MED ORDER — OXYCODONE HCL 10 MG PO TABS: 10.0000 mg | ORAL_TABLET | Freq: Four times a day (QID) | ORAL | 0 refills | Status: DC | PRN

## 2023-02-10 NOTE — Progress Notes (Signed)
Subjective:    Patient ID: Samuel [name], male    DOB: February 28, 1976, 47 y.o.   MRN: [id number]  HPI: Samuel [name] is a 47 y.o. male who returns for follow up appointment for chronic pain and medication refill. He states his pain is located in his lower back pain radiating into his bilateral hips and bilateral lower extremities. He rates his pain 6. His current exercise regime is walking and performing stretching exercises.  Mr. Ripoll Morphine equivalent is 114.00 MME.   Last UDS was performed on 11/04/2022 it was consistent.      Pain Inventory Average Pain 6 Pain Right Now 6 My pain is constant, sharp, burning, dull, stabbing, and aching  In the last 24 hours, has pain interfered with the following? General activity 9 Relation with others 9 Enjoyment of life 9 What TIME of day is your pain at its worst? morning , evening, and night Sleep (in general) Fair  Pain is worse with: walking, bending, sitting, inactivity, and standing Pain improves with: rest and medication Relief from Meds: 5  Family History  Problem Relation Age of Onset   Hypertension Father    Social History   Socioeconomic History   Marital status: Married    Spouse name: Not on file   Number of children: Not on file   Years of education: Not on file   Highest education level: Not on file  Occupational History   Not on file  Tobacco Use   Smoking status: Never   Smokeless tobacco: Never  Vaping Use   Vaping status: Never Used  Substance and Sexual Activity   Alcohol use: No    Alcohol/week: 0.0 standard drinks of alcohol   Drug use: No   Sexual activity: Not on file  Other Topics Concern   Not on file  Social History Narrative   Not on file   Social Determinants of Health   Financial Resource Strain: High Risk (07/01/2022)   Received from [facility], [facility]   Overall Financial Resource Strain (CARDIA)    Difficulty of Paying Living Expenses: Hard  Food Insecurity: Food  Insecurity Present (07/01/2022)   Received from [facility], [facility]   Hunger Vital Sign    Worried About Running Out of Food in the Last Year: Sometimes true    Ran Out of Food in the Last Year: Patient declined  Transportation Needs: No Transportation Needs (07/01/2022)   Received from [employer], [facility]   PRAPARE - Transportation    Lack of Transportation (Medical): No    Lack of Transportation (Non-Medical): No  Physical Activity: Unknown (07/01/2022)   Received from [facility]   Exercise Vital Sign    Days of Exercise per Week: 0 days    Minutes of Exercise per Session: Not on file  Recent Concern: Physical Activity - Inactive (07/01/2022)   Received from [facility], [facility]   Exercise Vital Sign    Days of Exercise per Week: 0 days    Minutes of Exercise per Session: 0 min  Stress: No Stress Concern Present (07/01/2022)   Received from [facility], [facility] of Occupational Health - Occupational Stress Questionnaire    Feeling of Stress : Only a little  Social Connections: Moderately Integrated (07/01/2022)   Received from [facility], [facility]   Social Network    How would you rate your social network (family, work, friends)?: Adequate participation with social networks   Past Surgical  History:  Procedure Laterality Date   BACK SURGERY     lower back surgery x 2   Past Surgical History:  Procedure Laterality Date   BACK SURGERY     lower back surgery x 2   Past Medical History:  Diagnosis Date   Arthritis    "some in my back"   GERD (gastroesophageal reflux disease)    Headache(784.0)    BP 120/75   Pulse 60   Ht 6\' 1"  (1.854 m)   Wt 252 lb (114.3 kg)   SpO2 98%   BMI 33.25 kg/m   Opioid Risk Score:   Fall Risk Score:  `1  Depression screen [facility] 2/9     02/10/2023    8:58 AM 10/07/2022    9:14 AM 08/07/2022    9:45 AM 05/08/2022    9:52 AM 05/07/2022    7:59 AM 04/07/2022    9:58 AM 04/06/2022     8:24 AM  Depression screen PHQ 2/9  Decreased Interest 0 0 1 0 0 1 0  Down, Depressed, Hopeless 0 0 1 0 0 1 0  PHQ - 2 Score 0 0 2 0 0 2 0    Review of Systems  Musculoskeletal:  Positive for back pain and gait problem.       Pain in both hips  All other systems reviewed and are negative.      Objective:   Physical Exam Vitals and nursing note reviewed.  Constitutional:      Appearance: Normal appearance.  Cardiovascular:     Rate and Rhythm: Normal rate and regular rhythm.     Pulses: Normal pulses.     Heart sounds: Normal heart sounds.  Pulmonary:     Effort: Pulmonary effort is normal.     Breath sounds: Normal breath sounds.  Musculoskeletal:     Cervical back: Normal range of motion and neck supple.     Comments: Normal Muscle Bulk and Muscle Testing Reveals:  Upper Extremities: Full ROM and Muscle Strength 5/5 Lumbar Hypersensitivity Lower Extremities: Decreased ROM and Muscle Strength 5/5 Bilateral Lower Extremity Flexion Produces Pain into his Lumbar Arises from Table slowly using cane for support Antalgic Gait     Skin:    General: Skin is warm and dry.  Neurological:     General: No focal deficit present.     Mental Status: He is alert and oriented to person, place, and time.  Psychiatric:        Mood and Affect: Mood normal.        Behavior: Behavior normal.         Assessment & Plan:  1.  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 He has lumbar degenerative disc disease. Continue current medication regimen with  Gabapentin, Continue current Medication regimen . S/P  Left  Lumbar Facet Radiofrequency Ablation with sedation, on 04/21/2022 by Dr. Laban Emperor with good results noted and S/P Right RFA with Dr. Shauna Hugh on 12/14,2023, with good relief noted.  Refilled: MS Contin 30 mg one tablet every 12 hours  #60 and Oxycodone 10 mg one tablet every 6 hours as needed for pain #120. We will continue the opioid monitoring program,  this consists of regular clinic visits, examinations, urine drug screen, pill counts as well as use of [place] Controlled Substance Reporting system. A 12 month History has been reviewed on the [place] Controlled Substance Reporting System on 02/10/2023. 2. Sacroiliac Dysfunction: Continue with current medication, heat and exercise  regime. 02/10/2023 3. Muscle Spasm: Continue Tizanidine as needed. Continue to Monitor.02/10/2023 4. Chronic Pain Syndrome: Continue MS Contin and Oxycodone as needed for pain. Continue to Monitor. 02/10/2023. 5. Left Greater Trochanteric Tenderness: Left Hip Osteoarthritis:  S/P  left intra-articular hip injection  with 8 days of relief Mr. Loga reports. Continue with alternating Ice and Heat Therapy. Continue to monitor. 02/10/2023. 6. Bradycardia: He's prescribed Coreg and his  PCP and Gastroenterologist Following he states. We will continue to monitor.02/10/2023 7. Weight Loss: Gastroenterologist Following. Mr. Zur states he was instructed to lose weight by his gastroenterologist , he is following a healthy diet regime and exercise. We will continue to monitor. 02/10/2023.   F/U in 1 month

## 2023-02-23 ENCOUNTER — Telehealth: Payer: Self-pay | Admitting: Registered Nurse

## 2023-02-23 MED ORDER — MORPHINE SULFATE ER 30 MG PO TBCR: 30.0000 mg | EXTENDED_RELEASE_TABLET | Freq: Two times a day (BID) | ORAL | 0 refills | Status: DC

## 2023-02-23 NOTE — Telephone Encounter (Signed)
PMP was Reviewed.  MS Contin e-scribed.  [facility] out of stock. [name] is aware via My-Chart message.

## 2023-02-24 ENCOUNTER — Telehealth: Payer: Self-pay | Admitting: Registered Nurse

## 2023-02-24 MED ORDER — MORPHINE SULFATE ER 30 MG PO TBCR: 30.0000 mg | EXTENDED_RELEASE_TABLET | Freq: Two times a day (BID) | ORAL | 0 refills | Status: DC

## 2023-02-24 NOTE — Telephone Encounter (Signed)
PMP was Reviewed.  Morphine e-scribed to [facility] and Mr. Peace is aware.

## 2023-03-10 ENCOUNTER — Encounter: Payer: Medicare Other | Attending: Physical Medicine & Rehabilitation | Admitting: Registered Nurse

## 2023-03-10 ENCOUNTER — Encounter: Payer: Self-pay | Admitting: Registered Nurse

## 2023-03-10 VITALS — BP 103/54 | HR 60 | Ht 73.0 in | Wt 253.0 lb

## 2023-03-10 DIAGNOSIS — Z79891 Long term (current) use of opiate analgesic: Secondary | ICD-10-CM | POA: Diagnosis not present

## 2023-03-10 DIAGNOSIS — G894 Chronic pain syndrome: Secondary | ICD-10-CM

## 2023-03-10 DIAGNOSIS — M961 Postlaminectomy syndrome, not elsewhere classified: Secondary | ICD-10-CM

## 2023-03-10 DIAGNOSIS — Z5181 Encounter for therapeutic drug level monitoring: Secondary | ICD-10-CM | POA: Diagnosis not present

## 2023-03-10 DIAGNOSIS — M5416 Radiculopathy, lumbar region: Secondary | ICD-10-CM | POA: Diagnosis not present

## 2023-03-10 MED ORDER — MORPHINE SULFATE ER 30 MG PO TBCR: 30.0000 mg | EXTENDED_RELEASE_TABLET | Freq: Two times a day (BID) | ORAL | 0 refills | Status: DC

## 2023-03-10 MED ORDER — OXYCODONE HCL 10 MG PO TABS: 10.0000 mg | ORAL_TABLET | Freq: Four times a day (QID) | ORAL | 0 refills | Status: DC | PRN

## 2023-03-10 NOTE — Progress Notes (Signed)
Subjective:    Patient ID: Samuel [name], male    DOB: [birth year], 47 y.o.   MRN: [id number]  HPI: Samuel [name] is a 47 y.o. male who returns for follow up appointment for chronic pain and medication refill. He states his pain is located in his lower back radiating into his bilateral hips and bilateral lower extremities. He rates his pain 6. His current exercise regime is walking and performing stretching exercises.  [name] Morphine equivalent is 120.00 MME.   UDS ordered today.    Pain Inventory Average Pain 6 Pain Right Now 6 My pain is constant, sharp, burning, dull, stabbing, and aching  In the last 24 hours, has pain interfered with the following? General activity 9 Relation with others 9 Enjoyment of life 9 What TIME of day is your pain at its worst? morning , evening, and night Sleep (in general) Fair  Pain is worse with: walking, bending, sitting, inactivity, and standing Pain improves with: rest and medication Relief from Meds: 5  Family History  Problem Relation Age of Onset   Hypertension Father    Social History   Socioeconomic History   Marital status: Married    Spouse name: Not on file   Number of children: Not on file   Years of education: Not on file   Highest education level: Not on file  Occupational History   Not on file  Tobacco Use   Smoking status: Never   Smokeless tobacco: Never  Vaping Use   Vaping status: Never Used  Substance and Sexual Activity   Alcohol use: No    Alcohol/week: 0.0 standard drinks of alcohol   Drug use: No   Sexual activity: Not on file  Other Topics Concern   Not on file  Social History Narrative   Not on file   Social Determinants of Health   Financial Resource Strain: High Risk (07/01/2022)   Received [facility], [facility]   Overall Financial Resource Strain (CARDIA)    Difficulty of Paying Living Expenses: Hard  Food Insecurity: Food Insecurity Present (07/01/2022)   Received from  [facility], [facility]   Hunger Vital Sign    Worried About Running Out of Food in the Last Year: Sometimes true    Ran Out of Food in the Last Year: Patient declined  Transportation Needs: No Transportation Needs (07/01/2022)   Received from [employer], [facility]   PRAPARE - Transportation    Lack of Transportation (Medical): No    Lack of Transportation (Non-Medical): No  Physical Activity: Unknown (07/01/2022)   Received from [facility]   Exercise Vital Sign    Days of Exercise per Week: 0 days    Minutes of Exercise per Session: Not on file  Recent Concern: Physical Activity - Inactive (07/01/2022)   Received from [facility], [facility]   Exercise Vital Sign    Days of Exercise per Week: 0 days    Minutes of Exercise per Session: 0 min  Stress: No Stress Concern Present (07/01/2022)   Received from [facility], [facility] of Occupational Health - Occupational Stress Questionnaire    Feeling of Stress : Only a little  Social Connections: Moderately Integrated (07/01/2022)   Received from [facility], [facility]   Social Network    How would you rate your social network (family, work, friends)?: Adequate participation with social networks   Past Surgical History:  Procedure Laterality Date   BACK SURGERY  lower back surgery x 2   Past Surgical History:  Procedure Laterality Date   BACK SURGERY     lower back surgery x 2   Past Medical History:  Diagnosis Date   Arthritis    "some in my back"   GERD (gastroesophageal reflux disease)    Headache(784.0)    There were no vitals taken for this visit.  Opioid Risk Score:   Fall Risk Score:  `1  Depression screen [facility] 2/9     02/10/2023    8:58 AM 10/07/2022    9:14 AM 08/07/2022    9:45 AM 05/08/2022    9:52 AM 05/07/2022    7:59 AM 04/07/2022    9:58 AM 04/06/2022    8:24 AM  Depression screen PHQ 2/9  Decreased Interest 0 0 1 0 0 1 0  Down, Depressed, Hopeless 0 0 1  0 0 1 0  PHQ - 2 Score 0 0 2 0 0 2 0      Review of Systems  Musculoskeletal:  Positive for back pain.       Pain in the outer bilateral upper leg  All other systems reviewed and are negative.     Objective:   Physical Exam Vitals and nursing note reviewed.  Constitutional:      Appearance: Normal appearance.  Cardiovascular:     Rate and Rhythm: Normal rate and regular rhythm.     Pulses: Normal pulses.     Heart sounds: Normal heart sounds.  Pulmonary:     Effort: Pulmonary effort is normal.     Breath sounds: Normal breath sounds.  Musculoskeletal:     Cervical back: Normal range of motion and neck supple.     Comments: Normal Muscle Bulk and Muscle Testing Reveals:  Upper Extremities: Full ROM and Muscle Strength 5/5  Lumbar Hypersensitivity Lower Extremities: Decreased ROM and Muscle Strength 5/5 Bilateral Lower Extremities Flexion Produces Pain into his Lumbar Arises from Table slowly Antalgic Gait     Skin:    General: Skin is warm and dry.  Neurological:     Mental Status: He is alert and oriented to person, place, and time.  Psychiatric:        Mood and Affect: Mood normal.        Behavior: Behavior normal.         Assessment & Plan:  1.  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 He has lumbar degenerative disc disease. Continue current medication regimen with  Gabapentin, Continue current Medication regimen . S/P  Left  Lumbar Facet Radiofrequency Ablation with sedation, on 04/21/2022 by Dr. Laban Emperor with good results noted and S/P Right RFA with Dr. Shauna Hugh on 12/14,2023, with good relief noted.  Refilled: MS Contin 30 mg one tablet every 12 hours  #60 and Oxycodone 10 mg one tablet every 6 hours as needed for pain #120. We will continue the opioid monitoring program, this consists of regular clinic visits, examinations, urine drug screen, pill counts as well as use of [place] Controlled Substance Reporting system. A 12  month History has been reviewed on the [place] Controlled Substance Reporting System on 03/10/2023. 2. Sacroiliac Dysfunction: Continue with current medication, heat and exercise regime. 03/10/2023 3. Muscle Spasm: Continue Tizanidine as needed. Continue to Monitor.03/10/2023 4. Chronic Pain Syndrome: Continue MS Contin and Oxycodone as needed for pain. Continue to Monitor. 03/10/2023. 5. Left Greater Trochanteric Tenderness: Left Hip Osteoarthritis:  S/P  left intra-articular hip injection  with  8 days of relief Mr. Dey reports. Continue with alternating Ice and Heat Therapy. Continue to monitor. 03/10/2023. 6. Bradycardia: No complaints today: He's prescribed Coreg and his  PCP and Gastroenterologist Following he states. We will continue to monitor.03/10/2023 7. Weight Loss: He has gain 1 lbs since his last visit. Gastroenterologist Following. Mr. Trettin reports  he was instructed to lose weight by his gastroenterologist , he is following a healthy diet regime and exercise. We will continue to monitor. 03/10/2023.   F/U in 1 month

## 2023-03-13 LAB — TOXASSURE SELECT,+ANTIDEPR,UR

## 2023-04-08 ENCOUNTER — Encounter: Payer: Medicare Other | Attending: Physical Medicine & Rehabilitation | Admitting: Registered Nurse

## 2023-04-08 ENCOUNTER — Encounter: Payer: Self-pay | Admitting: Registered Nurse

## 2023-04-08 VITALS — BP 108/70 | HR 64 | Ht 73.0 in | Wt 251.0 lb

## 2023-04-08 DIAGNOSIS — M7061 Trochanteric bursitis, right hip: Secondary | ICD-10-CM | POA: Diagnosis not present

## 2023-04-08 DIAGNOSIS — Z5181 Encounter for therapeutic drug level monitoring: Secondary | ICD-10-CM | POA: Insufficient documentation

## 2023-04-08 DIAGNOSIS — M5416 Radiculopathy, lumbar region: Secondary | ICD-10-CM | POA: Insufficient documentation

## 2023-04-08 DIAGNOSIS — M7062 Trochanteric bursitis, left hip: Secondary | ICD-10-CM | POA: Diagnosis not present

## 2023-04-08 DIAGNOSIS — Z79891 Long term (current) use of opiate analgesic: Secondary | ICD-10-CM | POA: Diagnosis not present

## 2023-04-08 DIAGNOSIS — M961 Postlaminectomy syndrome, not elsewhere classified: Secondary | ICD-10-CM | POA: Insufficient documentation

## 2023-04-08 DIAGNOSIS — G894 Chronic pain syndrome: Secondary | ICD-10-CM | POA: Diagnosis not present

## 2023-04-08 MED ORDER — MORPHINE SULFATE ER 30 MG PO TBCR: 30.0000 mg | EXTENDED_RELEASE_TABLET | Freq: Two times a day (BID) | ORAL | 0 refills | Status: DC

## 2023-04-08 MED ORDER — OXYCODONE HCL 10 MG PO TABS: 10.0000 mg | ORAL_TABLET | Freq: Four times a day (QID) | ORAL | 0 refills | Status: DC | PRN

## 2023-04-08 NOTE — Progress Notes (Signed)
Subjective:    Patient ID: Samuel [name], male    DOB: 1975-10-13, 47 y.o.   MRN: [id number]  HPI: Samuel [name] is a 47 y.o. male who returns for follow up appointment for chronic pain and medication refill. He states his pain is located in his lower back radiating into his bilateral hips and bilateral lower extremities. He rates his pain 6. His current exercise regime is walking and performing stretching exercises.  Samuel [name] reports at times when he's home, he feels as though his blood pressure is dropping, he checks his blood pressure during those times. Also reports he has experienced dizziness. He states he also  checks his blood sugar during those times as well. .  Samuel [name] denies dizziness at this time, his blood pressure was re-checked today. He was instructed to F/U with PCP and Gastroenterologist, keep a blood pressure log, he verbalizes understanding.   Samuel [name] Morphine equivalent is 120.00 MME.   Last UDS was Performed on 03/10/2023, it was consistent.     Pain Inventory Average Pain 7 Pain Right Now 6 My pain is constant, sharp, dull, stabbing, and aching  In the last 24 hours, has pain interfered with the following? General activity 10 Relation with others 10 Enjoyment of life 10 What TIME of day is your pain at its worst? morning , daytime, evening, and night Sleep (in general) Fair  Pain is worse with: walking, bending, sitting, inactivity, and standing Pain improves with: rest and medication Relief from Meds: 4  Family History  Problem Relation Age of Onset   Hypertension Father    Social History   Socioeconomic History   Marital status: Married    Spouse name: Not on file   Number of children: Not on file   Years of education: Not on file   Highest education level: Not on file  Occupational History   Not on file  Tobacco Use   Smoking status: Never   Smokeless tobacco: Never  Vaping Use   Vaping status: Never Used  Substance and Sexual  Activity   Alcohol use: No    Alcohol/week: 0.0 standard drinks of alcohol   Drug use: No   Sexual activity: Not on file  Other Topics Concern   Not on file  Social History Narrative   Not on file   Social Determinants of Health   Financial Resource Strain: High Risk (07/01/2022)   Received from [facility], [facility]   Overall Financial Resource Strain (CARDIA)    Difficulty of Paying Living Expenses: Hard  Food Insecurity: Food Insecurity Present (07/01/2022)   Received from [facility], [facility]   Hunger Vital Sign    Worried About Running Out of Food in the Last Year: Sometimes true    Ran Out of Food in the Last Year: Patient declined  Transportation Needs: No Transportation Needs (07/01/2022)   Received from [employer], [facility]   PRAPARE - Transportation    Lack of Transportation (Medical): No    Lack of Transportation (Non-Medical): No  Physical Activity: Unknown (07/01/2022)   Received from [facility]   Exercise Vital Sign    Days of Exercise per Week: 0 days    Minutes of Exercise per Session: Not on file  Recent Concern: Physical Activity - Inactive (07/01/2022)   Received from [facility], [facility]   Exercise Vital Sign    Days of Exercise per Week: 0 days    Minutes of Exercise per Session: 0  min  Stress: No Stress Concern Present (07/01/2022)   Received from [facility], [facility] of Occupational Health - Occupational Stress Questionnaire    Feeling of Stress : Only a little  Social Connections: Moderately Integrated (07/01/2022)   Received from [facility], [facility]   Social Network    How would you rate your social network (family, work, friends)?: Adequate participation with social networks   Past Surgical History:  Procedure Laterality Date   BACK SURGERY     lower back surgery x 2   Past Surgical History:  Procedure Laterality Date   BACK SURGERY     lower back surgery x 2   Past Medical  History:  Diagnosis Date   Arthritis    "some in my back"   GERD (gastroesophageal reflux disease)    Headache(784.0)    BP 97/63   Pulse 64   Ht 6\' 1"  (1.854 m)   Wt 251 lb (113.9 kg)   SpO2 94%   BMI 33.12 kg/m   Opioid Risk Score:   Fall Risk Score:  `1  Depression screen [facility] 2/9     02/10/2023    8:58 AM 10/07/2022    9:14 AM 08/07/2022    9:45 AM 05/08/2022    9:52 AM 05/07/2022    7:59 AM 04/07/2022    9:58 AM 04/06/2022    8:24 AM  Depression screen PHQ 2/9  Decreased Interest 0 0 1 0 0 1 0  Down, Depressed, Hopeless 0 0 1 0 0 1 0  PHQ - 2 Score 0 0 2 0 0 2 0     Review of Systems  Musculoskeletal:  Positive for back pain.       Bilateral leg pain  All other systems reviewed and are negative.     Objective:   Physical Exam Vitals and nursing note reviewed.  Constitutional:      Appearance: Normal appearance. He is obese.  Cardiovascular:     Rate and Rhythm: Normal rate and regular rhythm.     Pulses: Normal pulses.     Heart sounds: Normal heart sounds.  Pulmonary:     Effort: Pulmonary effort is normal.     Breath sounds: Normal breath sounds.  Musculoskeletal:     Comments: Normal Muscle Bulk and Muscle Testing Reveals:  Upper Extremities: Full ROM and Muscle Strength  5/5  Lumbar Hypersensitivity Bilateral Greater Trochanter Tenderness Lower Extremities: Full ROM and Muscle Strength 5/5 Arises from Table slowly using cane for support Antalgic  Gait     Skin:    General: Skin is warm and dry.  Neurological:     Mental Status: He is alert and oriented to person, place, and time.  Psychiatric:        Mood and Affect: Mood normal.        Behavior: Behavior normal.         Assessment & Plan:  1.  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 He has lumbar degenerative disc disease. Continue current medication regimen with  Gabapentin, Continue current Medication regimen . S/P  Left  Lumbar Facet Radiofrequency  Ablation with sedation, on 04/21/2022 by Dr. Laban Emperor with good results noted and S/P Right RFA with Dr. Shauna Hugh on 12/14,2023, with good relief noted.  Refilled: MS Contin 30 mg one tablet every 12 hours  #60 and Oxycodone 10 mg one tablet every 6 hours as needed for pain #120. We will continue the opioid  monitoring program, this consists of regular clinic visits, examinations, urine drug screen, pill counts as well as use of [place] Controlled Substance Reporting system. A 12 month History has been reviewed on the [place] Controlled Substance Reporting System on 04/08/2023. 2. Sacroiliac Dysfunction: Continue with current medication, heat and exercise regime. 04/08/2023 3. Muscle Spasm: Continue Tizanidine as needed. Continue to Monitor.04/08/2023 4. Chronic Pain Syndrome: Continue MS Contin and Oxycodone as needed for pain. Continue to Monitor. 04/08/2023. 5. Left Greater Trochanteric Tenderness: Left Hip Osteoarthritis:  S/P  left intra-articular hip injection  with 8 days of relief Samuel [name] reports. Continue with alternating Ice and Heat Therapy. Continue to monitor. 04/08/2023. 6. Bradycardia: No complaints today: He's prescribed Coreg and his  PCP and Gastroenterologist Following he states. We will continue to monitor.04/08/2023 7. Weight Loss: He has gain 1 lbs since his last visit. Gastroenterologist Following. Samuel [name] reports  he was instructed to lose weight by his gastroenterologist , he is following a healthy diet regime and exercise. We will continue to monitor. 04/08/2023.   F/U in 1 month

## 2023-04-25 ENCOUNTER — Other Ambulatory Visit: Payer: Self-pay | Admitting: Registered Nurse

## 2023-04-26 NOTE — Telephone Encounter (Signed)
Last fill per pmp 01/11/2023  01/11/2023 07/21/2022 1  Gabapentin 300 Mg Capsule 270.00 90 [facility]o [id number] [facility] ([id number]) 2/2  Medicare [facility]

## 2023-04-26 NOTE — Telephone Encounter (Signed)
PMP was Reviewed.  Gabapentin E-scribed to pharmacy.

## 2023-05-11 NOTE — Progress Notes (Unsigned)
Subjective:    Patient ID: Samuel [name], male    DOB: 1975-06-29, 47 y.o.   MRN: [id number]  HPI: Samuel [name] is a 47 y.o. male who returns for follow up appointment for chronic pain and medication refill. He states his pain is located in his lower back radiating into his bilateral hips and bilateral lower extremities.Marland KitchenHe rates his pain 6. His current exercise regime is walking and performing stretching exercises.  Samuel [name].   Last UDS on Performed on 03/10/2023, it was consistent.    Pain Inventory Average Pain 6 Pain Right Now 6 My pain is constant, sharp, burning, dull, stabbing, and aching  In the last 24 hours, has pain interfered with the following? General activity 9 Relation with others 9 Enjoyment of life 9 What TIME of day is your pain at its worst? morning , evening, and night Sleep (in general) Poor  Pain is worse with: walking, bending, sitting, inactivity, and standing Pain improves with: rest and medication Relief from Meds: 5  Family History  Problem Relation Age of Onset   Hypertension Father    Social History   Socioeconomic History   Marital status: Married    Spouse name: Not on file   Number of children: Not on file   Years of education: Not on file   Highest education level: Not on file  Occupational History   Not on file  Tobacco Use   Smoking status: Never   Smokeless tobacco: Never  Vaping Use   Vaping status: Never Used  Substance and Sexual Activity   Alcohol use: No    Alcohol/week: 0.0 standard drinks of alcohol   Drug use: No   Sexual activity: Not on file  Other Topics Concern   Not on file  Social History Narrative   Not on file   Social Drivers of Health   Financial Resource Strain: High Risk (07/01/2022)   Received from [facility], [facility]   Overall Financial Resource Strain (CARDIA)    Difficulty of Paying Living Expenses: Hard  Food Insecurity: Food Insecurity Present  (07/01/2022)   Received from [facility], [facility]   Hunger Vital Sign    Worried About Running Out of Food in the Last Year: Sometimes true    Ran Out of Food in the Last Year: Patient declined  Transportation Needs: No Transportation Needs (07/01/2022)   Received from [employer], [facility]   PRAPARE - Transportation    Lack of Transportation (Medical): No    Lack of Transportation (Non-Medical): No  Physical Activity: Unknown (07/01/2022)   Received from [facility]   Exercise Vital Sign    Days of Exercise per Week: 0 days    Minutes of Exercise per Session: Not on file  Recent Concern: Physical Activity - Inactive (07/01/2022)   Received from [facility], [facility]   Exercise Vital Sign    Days of Exercise per Week: 0 days    Minutes of Exercise per Session: 0 min  Stress: No Stress Concern Present (07/01/2022)   Received from [facility], [facility] of Occupational Health - Occupational Stress Questionnaire    Feeling of Stress : Only a little  Social Connections: Moderately Integrated (07/01/2022)   Received from [facility], [facility]   Social Network    How would you rate your social network (family, work, friends)?: Adequate participation with social networks   Past Surgical History:  Procedure Laterality  Date   BACK SURGERY     lower back surgery x 2   Past Surgical History:  Procedure Laterality Date   BACK SURGERY     lower back surgery x 2   Past Medical History:  Diagnosis Date   Arthritis    "some in my back"   GERD (gastroesophageal reflux disease)    Headache(784.0)    There were no vitals taken for this visit.  Opioid Risk Score:   Fall Risk Score:  `1  Depression screen [facility] 2/9     02/10/2023    8:58 AM 10/07/2022    9:14 AM 08/07/2022    9:45 AM 05/08/2022    9:52 AM 05/07/2022    7:59 AM 04/07/2022    9:58 AM 04/06/2022    8:24 AM  Depression screen PHQ 2/9  Decreased Interest 0 0 1 0 0 1 0   Down, Depressed, Hopeless 0 0 1 0 0 1 0  PHQ - 2 Score 0 0 2 0 0 2 0       Review of Systems  Musculoskeletal:  Positive for back pain and gait problem.       B/L hip pain   All other systems reviewed and are negative.     Objective:   Physical Exam Vitals and nursing note reviewed.  Constitutional:      Appearance: Normal appearance.  Cardiovascular:     Rate and Rhythm: Normal rate and regular rhythm.     Pulses: Normal pulses.     Heart sounds: Normal heart sounds.  Pulmonary:     Effort: Pulmonary effort is normal.     Breath sounds: Normal breath sounds.  Musculoskeletal:     Comments: Normal Muscle Bulk and Muscle Testing Reveals:  Upper Extremities: Full ROM and Muscle Strength 5/5  Lumbar Hypersensitivity Lower Extremities: Decreased ROM and Muscle Strength 5/5 Bilateral Lower extremities Flexion Produces Pain into his Lumbar Arises from Table slowly using cane for support Antalgic  Gait     Skin:    General: Skin is warm and dry.  Neurological:     Mental Status: He is alert and oriented to person, place, and time.  Psychiatric:        Mood and Affect: Mood normal.        Behavior: Behavior normal.         Assessment & Plan:  1.  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 He has lumbar degenerative disc disease. Continue current medication regimen with  Gabapentin, Continue current Medication regimen . S/P  Left  Lumbar Facet Radiofrequency Ablation with sedation, on 04/21/2022 by Dr. Laban Emperor with good results noted and S/P Right RFA with Dr. Shauna Hugh on 12/14,2023, with good relief noted.  Refilled: MS Contin 30 mg one tablet every 12 hours  #60 and Oxycodone 10 mg one tablet every 6 hours as needed for pain #120. We will continue the opioid monitoring program, this consists of regular clinic visits, examinations, urine drug screen, pill counts as well as use of [place] Controlled Substance Reporting system. A 12 month  History has been reviewed on the [place] Controlled Substance Reporting System on 05/12/2023. 2. Sacroiliac Dysfunction: Continue with current medication, heat and exercise regime. 05/12/2023 3. Muscle Spasm: Continue Tizanidine as needed. Continue to Monitor.05/12/2023 4. Chronic Pain Syndrome: Continue MS Contin and Oxycodone as needed for pain. Continue to Monitor. 05/12/2023. 5. Left Greater Trochanteric Tenderness: Left Hip Osteoarthritis:  S/P  left intra-articular hip injection  with 8 days of relief Mr. Launer reports. Continue with alternating Ice and Heat Therapy. Continue to monitor. 05/12/2023. 6. Bradycardia: He's prescribed Coreg and his  PCP and Gastroenterologist Following he states. We will continue to monitor.05/12/2023 7. Weight Loss: He has gain 1 lbs since his last visit. Gastroenterologist Following. Mr. Henault reports  he was instructed to lose weight by his gastroenterologist , he is following a healthy diet regime and exercise. We will continue to monitor. 05/12/2023.   F/U in 1 month

## 2023-05-12 ENCOUNTER — Encounter: Payer: Medicare Other | Attending: Physical Medicine & Rehabilitation | Admitting: Registered Nurse

## 2023-05-12 ENCOUNTER — Encounter: Payer: Self-pay | Admitting: Registered Nurse

## 2023-05-12 VITALS — BP 109/70 | HR 64 | Ht 73.0 in | Wt 252.0 lb

## 2023-05-12 DIAGNOSIS — G894 Chronic pain syndrome: Secondary | ICD-10-CM | POA: Diagnosis not present

## 2023-05-12 DIAGNOSIS — M7061 Trochanteric bursitis, right hip: Secondary | ICD-10-CM

## 2023-05-12 DIAGNOSIS — Z5181 Encounter for therapeutic drug level monitoring: Secondary | ICD-10-CM | POA: Diagnosis not present

## 2023-05-12 DIAGNOSIS — M7062 Trochanteric bursitis, left hip: Secondary | ICD-10-CM

## 2023-05-12 DIAGNOSIS — M961 Postlaminectomy syndrome, not elsewhere classified: Secondary | ICD-10-CM

## 2023-05-12 DIAGNOSIS — Z79891 Long term (current) use of opiate analgesic: Secondary | ICD-10-CM | POA: Diagnosis not present

## 2023-05-12 DIAGNOSIS — M5416 Radiculopathy, lumbar region: Secondary | ICD-10-CM | POA: Diagnosis not present

## 2023-05-12 MED ORDER — MORPHINE SULFATE ER 30 MG PO TBCR: 30.0000 mg | EXTENDED_RELEASE_TABLET | Freq: Two times a day (BID) | ORAL | 0 refills | Status: DC

## 2023-05-12 MED ORDER — OXYCODONE HCL 10 MG PO TABS: 10.0000 mg | ORAL_TABLET | Freq: Four times a day (QID) | ORAL | 0 refills | Status: DC | PRN

## 2023-05-14 ENCOUNTER — Telehealth: Payer: Self-pay

## 2023-05-14 ENCOUNTER — Other Ambulatory Visit (HOSPITAL_COMMUNITY): Payer: Self-pay

## 2023-05-14 MED ORDER — OXYCODONE HCL 10 MG PO TABS
10.0000 mg | ORAL_TABLET | Freq: Four times a day (QID) | ORAL | 0 refills | Status: DC | PRN
  Filled 2023-05-14: qty 120, 30d supply, fill #0

## 2023-05-14 NOTE — Addendum Note (Signed)
Addended by: Jones Bales on: 05/14/2023 11:55 AM   Modules accepted: Orders

## 2023-05-14 NOTE — Telephone Encounter (Signed)
PMP was Reviewed.  Oxycodone e-scribed to pharmacy.  Robbin will call Mr Spradling regarding the above.

## 2023-05-14 NOTE — Telephone Encounter (Signed)
Please send to [facility] on [facility].

## 2023-05-14 NOTE — Addendum Note (Signed)
Addended by: Becky Sax on: 05/14/2023 11:51 AM   Modules accepted: Orders

## 2023-05-14 NOTE — Telephone Encounter (Signed)
Oxycodone 10 MG is on back order.      The Rx was called and cancelled. Patient will call around to find a new location.

## 2023-05-17 NOTE — Telephone Encounter (Signed)
done

## 2023-06-09 ENCOUNTER — Encounter: Payer: Self-pay | Admitting: Registered Nurse

## 2023-06-09 ENCOUNTER — Encounter: Payer: Medicare Other | Attending: Physical Medicine & Rehabilitation | Admitting: Registered Nurse

## 2023-06-09 VITALS — BP 115/76 | HR 61 | Ht 73.0 in | Wt 246.0 lb

## 2023-06-09 DIAGNOSIS — Z79891 Long term (current) use of opiate analgesic: Secondary | ICD-10-CM | POA: Insufficient documentation

## 2023-06-09 DIAGNOSIS — Z5181 Encounter for therapeutic drug level monitoring: Secondary | ICD-10-CM | POA: Insufficient documentation

## 2023-06-09 DIAGNOSIS — M5416 Radiculopathy, lumbar region: Secondary | ICD-10-CM | POA: Insufficient documentation

## 2023-06-09 DIAGNOSIS — M961 Postlaminectomy syndrome, not elsewhere classified: Secondary | ICD-10-CM | POA: Insufficient documentation

## 2023-06-09 DIAGNOSIS — M7062 Trochanteric bursitis, left hip: Secondary | ICD-10-CM | POA: Diagnosis not present

## 2023-06-09 DIAGNOSIS — G894 Chronic pain syndrome: Secondary | ICD-10-CM | POA: Diagnosis not present

## 2023-06-09 DIAGNOSIS — M7061 Trochanteric bursitis, right hip: Secondary | ICD-10-CM | POA: Diagnosis not present

## 2023-06-09 MED ORDER — OXYCODONE HCL 10 MG PO TABS: 10.0000 mg | ORAL_TABLET | Freq: Four times a day (QID) | ORAL | 0 refills | Status: DC | PRN

## 2023-06-09 MED ORDER — MORPHINE SULFATE ER 30 MG PO TBCR: 30.0000 mg | EXTENDED_RELEASE_TABLET | Freq: Two times a day (BID) | ORAL | 0 refills | Status: DC

## 2023-06-09 NOTE — Progress Notes (Signed)
 Subjective:    Patient ID: Samuel [name], male    DOB: Dec 14, 1975, 48 y.o.   MRN: [id number]  HPI: Samuel [name] is a 48 y.o. male who returns for follow up appointment for chronic pain and medication refill. He states his pain is located in his lower back radiating into his bilateral hips and bilateral lower extremities. He  rates his pain 6. His current exercise regime is walking and performing stretching exercises.  Samuel [name] Morphine  equivalent is 120.00 MME.   Last UDS was Performed on 03/10/2023, it was consistent.    Pain Inventory Average Pain 6 Pain Right Now 6 My pain is constant, sharp, burning, dull, stabbing, and aching  In the last 24 hours, has pain interfered with the following? General activity 9 Relation with others 9 Enjoyment of life 9 What TIME of day is your pain at its worst? morning , evening, and night Sleep (in general) Fair  Pain is worse with: walking, bending, sitting, inactivity, and standing Pain improves with: rest and medication Relief from Meds: 5  Family History  Problem Relation Age of Onset   Hypertension Father    Social History   Socioeconomic History   Marital status: Married    Spouse name: Not on file   Number of children: Not on file   Years of education: Not on file   Highest education level: Not on file  Occupational History   Not on file  Tobacco Use   Smoking status: Never   Smokeless tobacco: Never  Vaping Use   Vaping status: Never Used  Substance and Sexual Activity   Alcohol  use: No    Alcohol /week: 0.0 standard drinks of alcohol    Drug use: No   Sexual activity: Not on file  Other Topics Concern   Not on file  Social History Narrative   Not on file   Social Drivers of Health   Financial Resource Strain: High Risk (07/01/2022)   Received from [facility], [facility]   Overall Financial Resource Strain (CARDIA)    Difficulty of Paying Living Expenses: Hard  Food Insecurity: Food Insecurity  Present (07/01/2022)   Received from [facility], [facility]   Hunger Vital Sign    Worried About Running Out of Food in the Last Year: Sometimes true    Ran Out of Food in the Last Year: Patient declined  Transportation Needs: No Transportation Needs (07/01/2022)   Received from [employer], [facility]   PRAPARE - Transportation    Lack of Transportation (Medical): No    Lack of Transportation (Non-Medical): No  Physical Activity: Unknown (07/01/2022)   Received from [facility]   Exercise Vital Sign    Days of Exercise per Week: 0 days    Minutes of Exercise per Session: Not on file  Recent Concern: Physical Activity - Inactive (07/01/2022)   Received from [facility], [facility]   Exercise Vital Sign    Days of Exercise per Week: 0 days    Minutes of Exercise per Session: 0 min  Stress: No Stress Concern Present (07/01/2022)   Received from [facility], [facility] of Occupational Health - Occupational Stress Questionnaire    Feeling of Stress : Only a little  Social Connections: Moderately Integrated (07/01/2022)   Received from [facility], [facility]   Social Network    How would you rate your social network (family, work, friends)?: Adequate participation with social networks   Past Surgical History:  Procedure Laterality Date   BACK SURGERY     lower back surgery x 2   Past Surgical History:  Procedure Laterality Date   BACK SURGERY     lower back surgery x 2   Past Medical History:  Diagnosis Date   Arthritis    "some in my back"   GERD (gastroesophageal reflux disease)    Headache(784.0)    BP 115/76   Pulse 61   Ht 6\' 1"  (1.854 m)   Wt 246 lb (111.6 kg)   SpO2 98%   BMI 32.46 kg/m   Opioid Risk Score:   Fall Risk Score:  `1  Depression screen [facility] 2/9     06/09/2023    9:28 AM 05/12/2023    8:57 AM 02/10/2023    8:58 AM 10/07/2022    9:14 AM 08/07/2022    9:45 AM 05/08/2022    9:52 AM 05/07/2022    7:59 AM   Depression screen PHQ 2/9  Decreased Interest 0 0 0 0 1 0 0  Down, Depressed, Hopeless 0 0 0 0 1 0 0  PHQ - 2 Score 0 0 0 0 2 0 0      Review of Systems  Musculoskeletal:  Positive for back pain.       B/L hip pain   All other systems reviewed and are negative.     Objective:   Physical Exam Vitals and nursing note reviewed.  Constitutional:      Appearance: Normal appearance.  Cardiovascular:     Rate and Rhythm: Normal rate and regular rhythm.     Pulses: Normal pulses.     Heart sounds: Normal heart sounds.  Pulmonary:     Effort: Pulmonary effort is normal.     Breath sounds: Normal breath sounds.  Musculoskeletal:     Comments: Normal Muscle Bulk and Muscle Testing Reveals:  Upper Extremities: Full  ROM and Muscle Strength 5/5 Thoracic and Lumbar Hypersensitivity Bilateral Greater Trochanter tenderness Lower Extremities: Decreased ROM and Muscle Strength 5/5 Bilateral Lower extremities Flexion Produces Pain into his Lumbar  Arises from chair slowly  Antalgic  Gait     Skin:    General: Skin is warm and dry.  Neurological:     Mental Status: He is alert and oriented to person, place, and time.  Psychiatric:        Mood and Affect: Mood normal.        Behavior: Behavior normal.         Assessment & Plan:  1.  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 He has lumbar degenerative disc disease. Continue current medication regimen with  Gabapentin , Continue current Medication regimen . S/P  Left  Lumbar Facet Radiofrequency Ablation with sedation, on 04/21/2022 by Dr. Barth Borne with good results noted and S/P Right RFA with Dr. Marica Shoals on 12/14,2023, with good relief noted.  Refilled: MS Contin  30 mg one tablet every 12 hours  #60 and Oxycodone  10 mg one tablet every 6 hours as needed for pain #120. We will continue the opioid monitoring program, this consists of regular clinic visits, examinations, urine drug screen, pill counts as well  as use of [place]  Controlled Substance Reporting system. A 12 month History has been reviewed on the [place]  Controlled Substance Reporting System on 06/09/2023. 2. Sacroiliac Dysfunction: Continue with current medication, heat and exercise regime. 06/09/2023 3. Muscle Spasm: Continue Tizanidine  as needed. Continue to Monitor.06/09/2023 4. Chronic Pain Syndrome: Continue MS Contin  and  Oxycodone  as needed for pain. Continue to Monitor. 06/09/2023. 5. Left Greater Trochanteric Tenderness: Left Hip Osteoarthritis:  S/P  left intra-articular hip injection  with 8 days of relief Mr. Kawecki reports. Continue with alternating Ice and Heat Therapy. Continue to monitor. 06/09/2023. 6. Bradycardia: He's prescribed Coreg and his  PCP and Gastroenterologist Following he states. We will continue to monitor.06/09/2023 7. Weight Loss:  Gastroenterologist Following. Mr. Kampe reports  he was instructed to lose weight by his gastroenterologist , he is following a healthy diet regime and exercise. We will continue to monitor. 06/09/2023.   F/U in 1 month

## 2023-06-16 DIAGNOSIS — K047 Periapical abscess without sinus: Secondary | ICD-10-CM | POA: Diagnosis not present

## 2023-06-16 DIAGNOSIS — K089 Disorder of teeth and supporting structures, unspecified: Secondary | ICD-10-CM | POA: Diagnosis not present

## 2023-06-16 DIAGNOSIS — Z1330 Encounter for screening examination for mental health and behavioral disorders, unspecified: Secondary | ICD-10-CM | POA: Diagnosis not present

## 2023-07-11 DIAGNOSIS — S61300A Unspecified open wound of right index finger with damage to nail, initial encounter: Secondary | ICD-10-CM | POA: Diagnosis not present

## 2023-07-12 ENCOUNTER — Telehealth: Payer: Self-pay | Admitting: Registered Nurse

## 2023-07-12 MED ORDER — OXYCODONE HCL 10 MG PO TABS: 10.0000 mg | ORAL_TABLET | Freq: Four times a day (QID) | ORAL | 0 refills | Status: DC | PRN

## 2023-07-12 NOTE — Telephone Encounter (Signed)
PMP was Reviewed.  Oxycodone e-scribed to pharmacy.  [name] is aware via My-Chart message.

## 2023-07-14 ENCOUNTER — Encounter
Payer: Federal, State, Local not specified - PPO | Attending: Physical Medicine & Rehabilitation | Admitting: Registered Nurse

## 2023-07-14 ENCOUNTER — Encounter: Payer: Self-pay | Admitting: Registered Nurse

## 2023-07-14 VITALS — BP 110/69 | HR 63 | Ht 73.0 in | Wt 250.0 lb

## 2023-07-14 DIAGNOSIS — Z5181 Encounter for therapeutic drug level monitoring: Secondary | ICD-10-CM | POA: Insufficient documentation

## 2023-07-14 DIAGNOSIS — Z79891 Long term (current) use of opiate analgesic: Secondary | ICD-10-CM | POA: Diagnosis not present

## 2023-07-14 DIAGNOSIS — G894 Chronic pain syndrome: Secondary | ICD-10-CM | POA: Insufficient documentation

## 2023-07-14 DIAGNOSIS — M5416 Radiculopathy, lumbar region: Secondary | ICD-10-CM | POA: Diagnosis not present

## 2023-07-14 DIAGNOSIS — M961 Postlaminectomy syndrome, not elsewhere classified: Secondary | ICD-10-CM | POA: Insufficient documentation

## 2023-07-14 MED ORDER — MORPHINE SULFATE ER 30 MG PO TBCR: 30.0000 mg | EXTENDED_RELEASE_TABLET | Freq: Two times a day (BID) | ORAL | 0 refills | Status: DC

## 2023-07-14 MED ORDER — OXYCODONE HCL 10 MG PO TABS: 10.0000 mg | ORAL_TABLET | Freq: Four times a day (QID) | ORAL | 0 refills | Status: DC | PRN

## 2023-07-14 NOTE — Progress Notes (Signed)
Subjective:    Patient ID: Samuel [name], male    DOB: 1975-06-01, 48 y.o.   MRN: [id number]  HPI: Samuel [name] is a 48 y.o. male who returns for follow up appointment for chronic pain and medication refill. He states his pain is located in his lower back radiating into her bilateral hips. He rates his pain 9. His current exercise regime is walking and performing stretching exercises.  [name] Morphine equivalent is 120.00 MME.   UDS ordered today.     Pain Inventory Average Pain 6 Pain Right Now 6 My pain is constant, sharp, burning, dull, stabbing, and aching  In the last 24 hours, has pain interfered with the following? General activity 9 Relation with others 9 Enjoyment of life 9 What TIME of day is your pain at its worst? morning , evening, and night Sleep (in general) Fair  Pain is worse with: walking, bending, sitting, inactivity, and standing Pain improves with: rest and medication Relief from Meds: 5  Family History  Problem Relation Age of Onset   Hypertension Father    Social History   Socioeconomic History   Marital status: Married    Spouse name: Not on file   Number of children: Not on file   Years of education: Not on file   Highest education level: Not on file  Occupational History   Not on file  Tobacco Use   Smoking status: Never   Smokeless tobacco: Never  Vaping Use   Vaping status: Never Used  Substance and Sexual Activity   Alcohol use: No    Alcohol/week: 0.0 standard drinks of alcohol   Drug use: No   Sexual activity: Not on file  Other Topics Concern   Not on file  Social History Narrative   Not on file   Social Drivers of Health   Financial Resource Strain: High Risk (06/16/2023)   Received from [facility]   Overall Financial Resource Strain (CARDIA)    Difficulty of Paying Living Expenses: Hard  Food Insecurity: Food Insecurity Present (06/16/2023)   Received from [facility]   Hunger Vital Sign    Worried  About Running Out of Food in the Last Year: Sometimes true    Ran Out of Food in the Last Year: Sometimes true  Transportation Needs: No Transportation Needs (06/16/2023)   Received from [facility] - Transportation    Lack of Transportation (Medical): No    Lack of Transportation (Non-Medical): No  Physical Activity: Unknown (07/01/2022)   Received from [facility]   Exercise Vital Sign    Days of Exercise per Week: 0 days    Minutes of Exercise per Session: Not on file  Recent Concern: Physical Activity - Inactive (07/01/2022)   Received from [facility], [facility]   Exercise Vital Sign    Days of Exercise per Week: 0 days    Minutes of Exercise per Session: 0 min  Stress: No Stress Concern Present (07/01/2022)   Received from [facility], [facility] of Occupational Health - Occupational Stress Questionnaire    Feeling of Stress : Only a little  Social Connections: Moderately Integrated (07/01/2022)   Received from [facility], [facility]   Social Network    How would you rate your social network (family, work, friends)?: Adequate participation with social networks   Past Surgical History:  Procedure Laterality Date   BACK SURGERY     lower back surgery x 2  Past Surgical History:  Procedure Laterality Date   BACK SURGERY     lower back surgery x 2   Past Medical History:  Diagnosis Date   Arthritis    "some in my back"   GERD (gastroesophageal reflux disease)    Headache(784.0)    BP 110/69   Pulse 63   Ht 6\' 1"  (1.854 m)   Wt 250 lb (113.4 kg)   SpO2 99%   BMI 32.98 kg/m   Opioid Risk Score:   Fall Risk Score:  `1  Depression screen [facility] 2/9     06/09/2023    9:28 AM 05/12/2023    8:57 AM 02/10/2023    8:58 AM 10/07/2022    9:14 AM 08/07/2022    9:45 AM 05/08/2022    9:52 AM 05/07/2022    7:59 AM  Depression screen PHQ 2/9  Decreased Interest 0 0 0 0 1 0 0  Down, Depressed, Hopeless 0 0 0 0 1 0 0  PHQ - 2  Score 0 0 0 0 2 0 0     Review of Systems  Musculoskeletal:  Positive for back pain.       Pain on the outside of both upper legs  All other systems reviewed and are negative.     Objective:   Physical Exam Vitals and nursing note reviewed.  Constitutional:      Appearance: Normal appearance.  Cardiovascular:     Rate and Rhythm: Normal rate and regular rhythm.     Pulses: Normal pulses.     Heart sounds: Normal heart sounds.  Pulmonary:     Effort: Pulmonary effort is normal.     Breath sounds: Normal breath sounds.  Musculoskeletal:     Comments: Normal Muscle Bulk and Muscle Testing Reveals:  Upper Extremities: Full ROM and Muscle Strength 5/5 Lumbar Hypersensitivity Lower Extremities: Decreased ROM and Muscle Strength 5/5 Bilateral Lower Extremity Flexion Produces Pain into his Lumbar Arises from Table slowly using Cane for support Antalgic  Gait     Skin:    General: Skin is warm and dry.  Neurological:     Mental Status: He is alert and oriented to person, place, and time.  Psychiatric:        Mood and Affect: Mood normal.        Behavior: Behavior normal.         Assessment & Plan:  1.  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 He has lumbar degenerative disc disease. Continue current medication regimen with  Gabapentin, Continue current Medication regimen . S/P  Left  Lumbar Facet Radiofrequency Ablation with sedation, on 04/21/2022 by Dr. Laban Emperor with good results noted and S/P Right RFA with Dr. Shauna Hugh on 12/14,2023, with good relief noted.  Refilled: MS Contin 30 mg one tablet every 12 hours  #60 and Oxycodone 10 mg one tablet every 6 hours as needed for pain #120. We will continue the opioid monitoring program, this consists of regular clinic visits, examinations, urine drug screen, pill counts as well as use of [place] Controlled Substance Reporting system. A 12 month History has been reviewed on the [place]  Controlled Substance Reporting System on 07/14/2023. 2. Sacroiliac Dysfunction: Continue with current medication, heat and exercise regime. 07/14/2023 3. Muscle Spasm: Continue Tizanidine as needed. Continue to Monitor.07/14/2023 4. Chronic Pain Syndrome: Continue MS Contin and Oxycodone as needed for pain. Continue to Monitor. 07/14/2023. 5. Left Greater Trochanteric Tenderness: Left Hip Osteoarthritis: No complaints today.  S/P  left intra-articular hip injection  with 8 days of relief Mr. Palau reports. Continue with alternating Ice and Heat Therapy. Continue to monitor. 07/14/2023. 6. Bradycardia: Pulse 62 today. He's prescribed Coreg and his  PCP and Gastroenterologist Following he states. We will continue to monitor.07/14/2023 7. Weight Loss:  No weight loss noted. He has gain 4 lbs. Gastroenterologist Following. Mr. Kollmann reports  he was instructed to lose weight by his gastroenterologist , he is following a healthy diet regime and exercise. We will continue to monitor. 07/14/2023.   F/U in 1 month

## 2023-07-18 LAB — TOXASSURE SELECT,+ANTIDEPR,UR

## 2023-07-20 DIAGNOSIS — K769 Liver disease, unspecified: Secondary | ICD-10-CM | POA: Diagnosis not present

## 2023-07-20 DIAGNOSIS — R932 Abnormal findings on diagnostic imaging of liver and biliary tract: Secondary | ICD-10-CM | POA: Diagnosis not present

## 2023-07-20 DIAGNOSIS — E119 Type 2 diabetes mellitus without complications: Secondary | ICD-10-CM | POA: Diagnosis not present

## 2023-07-20 DIAGNOSIS — I8510 Secondary esophageal varices without bleeding: Secondary | ICD-10-CM | POA: Diagnosis not present

## 2023-07-20 DIAGNOSIS — K7460 Unspecified cirrhosis of liver: Secondary | ICD-10-CM | POA: Diagnosis not present

## 2023-07-20 DIAGNOSIS — K7581 Nonalcoholic steatohepatitis (NASH): Secondary | ICD-10-CM | POA: Diagnosis not present

## 2023-07-20 DIAGNOSIS — E669 Obesity, unspecified: Secondary | ICD-10-CM | POA: Diagnosis not present

## 2023-07-20 DIAGNOSIS — Z6836 Body mass index (BMI) 36.0-36.9, adult: Secondary | ICD-10-CM | POA: Diagnosis not present

## 2023-07-20 DIAGNOSIS — K7469 Other cirrhosis of liver: Secondary | ICD-10-CM | POA: Diagnosis not present

## 2023-07-20 DIAGNOSIS — K766 Portal hypertension: Secondary | ICD-10-CM | POA: Diagnosis not present

## 2023-07-20 DIAGNOSIS — Z7985 Long-term (current) use of injectable non-insulin antidiabetic drugs: Secondary | ICD-10-CM | POA: Diagnosis not present

## 2023-07-21 ENCOUNTER — Other Ambulatory Visit: Payer: Self-pay | Admitting: Physician Assistant

## 2023-07-21 ENCOUNTER — Other Ambulatory Visit: Payer: Self-pay | Admitting: Registered Nurse

## 2023-07-21 DIAGNOSIS — K7469 Other cirrhosis of liver: Secondary | ICD-10-CM

## 2023-07-22 DIAGNOSIS — E119 Type 2 diabetes mellitus without complications: Secondary | ICD-10-CM | POA: Diagnosis not present

## 2023-07-23 ENCOUNTER — Other Ambulatory Visit: Payer: Self-pay | Admitting: Registered Nurse

## 2023-07-26 DIAGNOSIS — S61310A Laceration without foreign body of right index finger with damage to nail, initial encounter: Secondary | ICD-10-CM | POA: Diagnosis not present

## 2023-07-26 DIAGNOSIS — M79644 Pain in right finger(s): Secondary | ICD-10-CM | POA: Diagnosis not present

## 2023-07-28 ENCOUNTER — Telehealth: Payer: Self-pay | Admitting: Registered Nurse

## 2023-07-28 ENCOUNTER — Other Ambulatory Visit (HOSPITAL_COMMUNITY): Payer: Self-pay

## 2023-07-28 MED ORDER — MORPHINE SULFATE ER 30 MG PO TBCR
30.0000 mg | EXTENDED_RELEASE_TABLET | Freq: Two times a day (BID) | ORAL | 0 refills | Status: DC
  Filled 2023-07-28: qty 60, 30d supply, fill #0

## 2023-07-28 NOTE — Telephone Encounter (Signed)
 PMP was Reviewed.  Morphine prescription sent to pharmacy.  [name] is aware via My-Chart

## 2023-08-06 DIAGNOSIS — S61310D Laceration without foreign body of right index finger with damage to nail, subsequent encounter: Secondary | ICD-10-CM | POA: Diagnosis not present

## 2023-08-11 ENCOUNTER — Encounter: Payer: Medicare Other | Attending: Physical Medicine & Rehabilitation | Admitting: Registered Nurse

## 2023-08-11 ENCOUNTER — Encounter: Payer: Self-pay | Admitting: Registered Nurse

## 2023-08-11 VITALS — BP 107/68 | HR 64 | Ht 73.0 in | Wt 249.0 lb

## 2023-08-11 DIAGNOSIS — Z79891 Long term (current) use of opiate analgesic: Secondary | ICD-10-CM | POA: Diagnosis not present

## 2023-08-11 DIAGNOSIS — M7061 Trochanteric bursitis, right hip: Secondary | ICD-10-CM | POA: Diagnosis not present

## 2023-08-11 DIAGNOSIS — G894 Chronic pain syndrome: Secondary | ICD-10-CM | POA: Diagnosis not present

## 2023-08-11 DIAGNOSIS — Z5181 Encounter for therapeutic drug level monitoring: Secondary | ICD-10-CM | POA: Diagnosis not present

## 2023-08-11 DIAGNOSIS — R001 Bradycardia, unspecified: Secondary | ICD-10-CM | POA: Diagnosis not present

## 2023-08-11 DIAGNOSIS — M7062 Trochanteric bursitis, left hip: Secondary | ICD-10-CM | POA: Insufficient documentation

## 2023-08-11 DIAGNOSIS — M961 Postlaminectomy syndrome, not elsewhere classified: Secondary | ICD-10-CM | POA: Diagnosis not present

## 2023-08-11 DIAGNOSIS — M5416 Radiculopathy, lumbar region: Secondary | ICD-10-CM | POA: Diagnosis not present

## 2023-08-11 MED ORDER — MORPHINE SULFATE ER 30 MG PO TBCR: 30.0000 mg | EXTENDED_RELEASE_TABLET | Freq: Two times a day (BID) | ORAL | 0 refills | Status: DC

## 2023-08-11 MED ORDER — OXYCODONE HCL 10 MG PO TABS
10.0000 mg | ORAL_TABLET | Freq: Four times a day (QID) | ORAL | 0 refills | Status: DC | PRN
Start: 2023-08-11 — End: 2023-09-15

## 2023-08-11 NOTE — Progress Notes (Signed)
 Subjective:    Patient ID: Samuel [name], male    DOB: 1975/09/01, 48 y.o.   MRN: [id number]  HPI: Samuel [name] is a 48 y.o. male who returns for follow up appointment for chronic pain and medication refill. He states his pain is located in his lower back radiating into his bilateral hips and bilateral lower extremities. He  rates his pain 6. His current exercise regime is walking and performing stretching exercises.  Samuel [name] arrived Bradycardic, apical pulse checked.    Samuel [name] Morphine equivalent is 120.00 MME.   Last UDS was Performed on 07/14/2023, it was consistent.      Pain Inventory Average Pain 5 Pain Right Now 6 My pain is constant, burning, dull, stabbing, aching, and throbbing  In the last 24 hours, has pain interfered with the following? General activity 9 Relation with others 9 Enjoyment of life 9 What TIME of day is your pain at its worst? morning  and evening Sleep (in general) Fair  Pain is worse with: walking, bending, sitting, inactivity, standing, and some activites Pain improves with: rest and medication Relief from Meds: 5  Family History  Problem Relation Age of Onset   Hypertension Father    Social History   Socioeconomic History   Marital status: Married    Spouse name: Not on file   Number of children: Not on file   Years of education: Not on file   Highest education level: Not on file  Occupational History   Not on file  Tobacco Use   Smoking status: Never   Smokeless tobacco: Never  Vaping Use   Vaping status: Never Used  Substance and Sexual Activity   Alcohol use: No    Alcohol/week: 0.0 standard drinks of alcohol   Drug use: No   Sexual activity: Not on file  Other Topics Concern   Not on file  Social History Narrative   Not on file   Social Drivers of Health   Financial Resource Strain: Medium Risk (07/22/2023)   Received from [facility]   Overall Financial Resource Strain (CARDIA)    Difficulty of Paying  Living Expenses: Somewhat hard  Food Insecurity: Food Insecurity Present (07/22/2023)   Received from [facility]   Hunger Vital Sign    Worried About Running Out of Food in the Last Year: Sometimes true    Ran Out of Food in the Last Year: Sometimes true  Transportation Needs: No Transportation Needs (07/22/2023)   Received from [facility] - Transportation    Lack of Transportation (Medical): No    Lack of Transportation (Non-Medical): No  Physical Activity: Unknown (07/22/2023)   Received from [facility]   Exercise Vital Sign    Days of Exercise per Week: 0 days    Minutes of Exercise per Session: Not on file  Stress: Stress Concern Present (07/22/2023)   Received from [facility] - Occupational Stress Questionnaire    Feeling of Stress : Rather much  Social Connections: Moderately Integrated (07/22/2023)   Received from [facility]   Social Network    How would you rate your social network (family, work, friends)?: Adequate participation with social networks   Past Surgical History:  Procedure Laterality Date   BACK SURGERY     lower back surgery x 2   Past Surgical History:  Procedure Laterality Date   BACK SURGERY     lower back surgery x 2  Past Medical History:  Diagnosis Date   Arthritis    "some in my back"   GERD (gastroesophageal reflux disease)    Headache(784.0)    Ht 6\' 1"  (1.854 m)   Wt 249 lb (112.9 kg)   BMI 32.85 kg/m   Opioid Risk Score:   Fall Risk Score:  `1  Depression screen [facility] 2/9     06/09/2023    9:28 AM 05/12/2023    8:57 AM 02/10/2023    8:58 AM 10/07/2022    9:14 AM 08/07/2022    9:45 AM 05/08/2022    9:52 AM 05/07/2022    7:59 AM  Depression screen [facility] 2/9  Decreased Interest 0 0 0 0 1 0 0  Down, Depressed, Hopeless 0 0 0 0 1 0 0  PHQ - 2 Score 0 0 0 0 2 0 0    Review of Systems  Musculoskeletal:  Positive for back pain.       B/l hip       Objective:    Physical Exam Vitals and nursing note reviewed.  Constitutional:      Appearance: Normal appearance. He is obese.  Cardiovascular:     Rate and Rhythm: Bradycardia present.     Pulses: Normal pulses.     Heart sounds: Normal heart sounds.  Pulmonary:     Effort: Pulmonary effort is normal.     Breath sounds: Normal breath sounds.  Musculoskeletal:     Comments: Normal Muscle Bulk and Muscle Testing Reveals:  Upper Extremities: Full ROM and Muscle Strength 5/5  Lumbar Hypersensitivity Bilateral Greater Trochanter Tenderness Lower Extremities: Decreased ROM and Muscle Strength 5/5 Bilateral Lower Extremities Flexion Produces Pain into his Bilateral Lower Extremities Arises from table slowly using cane for support Antalgic Gait     Skin:    General: Skin is warm and dry.  Neurological:     Mental Status: He is alert and oriented to person, place, and time.  Psychiatric:        Mood and Affect: Mood normal.        Behavior: Behavior normal.          Assessment & Plan:  1.  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 He has lumbar degenerative disc disease. Continue current medication regimen with  Gabapentin, Continue current Medication regimen . S/P  Left  Lumbar Facet Radiofrequency Ablation with sedation, on 04/21/2022 by Dr. Laban Emperor with good results noted and S/P Right RFA with Dr. Shauna Hugh on 12/14,2023, with good relief noted.  Refilled: MS Contin 30 mg one tablet every 12 hours  #60 and Oxycodone 10 mg one tablet every 6 hours as needed for pain #120. We will continue the opioid monitoring program, this consists of regular clinic visits, examinations, urine drug screen, pill counts as well as use of [place] Controlled Substance Reporting system. A 12 month History has been reviewed on the [place] Controlled Substance Reporting System on 08/11/2023. 2. Sacroiliac Dysfunction: Continue with current medication, heat and exercise regime.  08/11/2023 3. Muscle Spasm: Continue Tizanidine as needed. Continue to Monitor.08/11/2023 4. Chronic Pain Syndrome: Continue MS Contin and Oxycodone as needed for pain. Continue to Monitor. 08/11/2023. 5. Left Greater Trochanteric Tenderness: Left Hip Osteoarthritis: No complaints today.  S/P  left intra-articular hip injection  with 8 days of relief Samuel [name] reports. Continue with alternating Ice and Heat Therapy. Continue to monitor. 08/11/2023. 6. Bradycardia: He's prescribed Coreg and his  PCP and Gastroenterologist Following he  states. We will continue to monitor.08/11/2023 7. Weight Loss:  Gastroenterologist Following. Samuel [name] reports  he was instructed to lose weight by his gastroenterologist , he is following a healthy diet regime and exercise. We will continue to monitor. 08/11/2023.   F/U in 1 month

## 2023-08-18 DIAGNOSIS — S61310D Laceration without foreign body of right index finger with damage to nail, subsequent encounter: Secondary | ICD-10-CM | POA: Diagnosis not present

## 2023-08-24 ENCOUNTER — Ambulatory Visit
Admission: RE | Admit: 2023-08-24 | Discharge: 2023-08-24 | Disposition: A | Discharge status: Home / Self Care | Payer: Medicare Other | Source: Ambulatory Visit | Attending: Physician Assistant | Admitting: Physician Assistant

## 2023-08-24 DIAGNOSIS — K7469 Other cirrhosis of liver: Secondary | ICD-10-CM

## 2023-08-24 DIAGNOSIS — R161 Splenomegaly, not elsewhere classified: Secondary | ICD-10-CM | POA: Diagnosis not present

## 2023-08-24 DIAGNOSIS — K7460 Unspecified cirrhosis of liver: Secondary | ICD-10-CM | POA: Diagnosis not present

## 2023-08-24 MED ORDER — GADOPICLENOL 0.5 MMOL/ML IV SOLN
10.0000 mL | Freq: Once | INTRAVENOUS | Status: AC | PRN
  Administered 2023-08-24: 10 mL via INTRAVENOUS

## 2023-09-15 ENCOUNTER — Encounter: Attending: Physical Medicine & Rehabilitation | Admitting: Registered Nurse

## 2023-09-15 ENCOUNTER — Encounter: Payer: Self-pay | Admitting: Registered Nurse

## 2023-09-15 VITALS — BP 106/69 | HR 60 | Wt 246.0 lb

## 2023-09-15 DIAGNOSIS — M7062 Trochanteric bursitis, left hip: Secondary | ICD-10-CM | POA: Diagnosis not present

## 2023-09-15 DIAGNOSIS — M961 Postlaminectomy syndrome, not elsewhere classified: Secondary | ICD-10-CM | POA: Diagnosis not present

## 2023-09-15 DIAGNOSIS — M5416 Radiculopathy, lumbar region: Secondary | ICD-10-CM | POA: Diagnosis not present

## 2023-09-15 DIAGNOSIS — Z5181 Encounter for therapeutic drug level monitoring: Secondary | ICD-10-CM | POA: Insufficient documentation

## 2023-09-15 DIAGNOSIS — G894 Chronic pain syndrome: Secondary | ICD-10-CM | POA: Insufficient documentation

## 2023-09-15 DIAGNOSIS — M7061 Trochanteric bursitis, right hip: Secondary | ICD-10-CM | POA: Diagnosis not present

## 2023-09-15 DIAGNOSIS — Z79891 Long term (current) use of opiate analgesic: Secondary | ICD-10-CM | POA: Insufficient documentation

## 2023-09-15 MED ORDER — MORPHINE SULFATE ER 30 MG PO TBCR: 30.0000 mg | EXTENDED_RELEASE_TABLET | Freq: Two times a day (BID) | ORAL | 0 refills | Status: DC

## 2023-09-15 MED ORDER — OXYCODONE HCL 10 MG PO TABS
10.0000 mg | ORAL_TABLET | Freq: Four times a day (QID) | ORAL | 0 refills | Status: AC | PRN
Start: 2023-09-15 — End: ?

## 2023-09-15 NOTE — Progress Notes (Signed)
 Subjective:    Patient ID: Samuel [name], male    DOB: 12/16/1975, 48 y.o.   MRN: [id number]  HPI: Samuel [name] is a 48 y.o. male who returns for follow up appointment for chronic pain and medication refill. He states his pain is located in his lower back radiating into his bilateral lower extremities. He rates his pain 7. His current exercise regime is walking and performing stretching exercises.  Samuel [name] is 120.00 MME.   Last UDS was Performed on 07/14/2023, it was consistent.      Pain Inventory Average Pain 5 Pain Right Now 7 My pain is constant, sharp, dull, stabbing, and aching  In the last 24 hours, has pain interfered with the following? General activity 9 Relation with others 9 Enjoyment of life 9 What TIME of day is your pain at its worst? morning , evening, and night Sleep (in general) Fair  Pain is worse with: walking, bending, sitting, inactivity, and standing Pain improves with: rest and medication Relief from Meds: 4  Family History  Problem Relation Age of Onset   Hypertension Father    Social History   Socioeconomic History   Marital status: Married    Spouse name: Not on file   Number of children: Not on file   Years of education: Not on file   Highest education level: Not on file  Occupational History   Not on file  Tobacco Use   Smoking status: Never   Smokeless tobacco: Never  Vaping Use   Vaping status: Never Used  Substance and Sexual Activity   Alcohol  use: No    Alcohol /week: 0.0 standard drinks of alcohol    Drug use: No   Sexual activity: Not on file  Other Topics Concern   Not on file  Social History Narrative   Not on file   Social Drivers of Health   Financial Resource Strain: Medium Risk (07/22/2023)   Received from [facility]   Overall Financial Resource Strain (CARDIA)    Difficulty of Paying Living Expenses: Somewhat hard  Food Insecurity: Food Insecurity Present (07/22/2023)   Received  from [facility]   Hunger Vital Sign    Worried About Running Out of Food in the Last Year: Sometimes true    Ran Out of Food in the Last Year: Sometimes true  Transportation Needs: No Transportation Needs (07/22/2023)   Received from [facility] - Transportation    Lack of Transportation (Medical): No    Lack of Transportation (Non-Medical): No  Physical Activity: Unknown (07/22/2023)   Received from [facility]   Exercise Vital Sign    Days of Exercise per Week: 0 days    Minutes of Exercise per Session: Not on file  Stress: Stress Concern Present (07/22/2023)   Received from [facility] - Occupational Stress Questionnaire    Feeling of Stress : Rather much  Social Connections: Moderately Integrated (07/22/2023)   Received from [facility]   Social Network    How would you rate your social network (family, work, friends)?: Adequate participation with social networks   Past Surgical History:  Procedure Laterality Date   BACK SURGERY     lower back surgery x 2   Past Surgical History:  Procedure Laterality Date   BACK SURGERY     lower back surgery x 2   Past Medical History:  Diagnosis Date   Arthritis    "some in my  back"   GERD (gastroesophageal reflux disease)    Headache(784.0)    There were no vitals taken for this visit.  Opioid Risk Score:   Fall Risk Score:  `1  Depression screen [facility] 2/9     08/11/2023    9:07 AM 06/09/2023    9:28 AM 05/12/2023    8:57 AM 02/10/2023    8:58 AM 10/07/2022    9:14 AM 08/07/2022    9:45 AM 05/08/2022    9:52 AM  Depression screen PHQ 2/9  Decreased Interest 0 0 0 0 0 1 0  Down, Depressed, Hopeless 0 0 0 0 0 1 0  PHQ - 2 Score 0 0 0 0 0 2 0    Review of Systems  Musculoskeletal:  Positive for back pain and gait problem.       Pain in : both hips down to both knees  All other systems reviewed and are negative.      Objective:   Physical Exam Vitals and  nursing note reviewed.  Constitutional:      Appearance: Normal appearance.  Cardiovascular:     Rate and Rhythm: Normal rate and regular rhythm.     Pulses: Normal pulses.     Heart sounds: Normal heart sounds.  Pulmonary:     Effort: Pulmonary effort is normal.     Breath sounds: Normal breath sounds.  Musculoskeletal:     Comments: Normal Muscle Bulk and Muscle Testing Reveals:  Upper Extremities: Full ROM and Muscle Strength 5/5 Lumbar Hypersensitivity Lower Extremities: Decreased ROM and Muscle Strength 5/5 Bilateral Lower Extremity Flexion Produces Pain into his Lumbar Arises from Table slowly using cane for support Antalgic  Gait     Skin:    General: Skin is warm and dry.  Neurological:     Mental Status: He is alert and oriented to person, place, and time.  Psychiatric:        Mood and Affect: Mood normal.        Behavior: Behavior normal.         Assessment & Plan:  1.  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 He has lumbar degenerative disc disease. Continue current medication regimen with  Gabapentin , Continue current Medication regimen . S/P  Left  Lumbar Facet Radiofrequency Ablation with sedation, on 04/21/2022 by Dr. Barth Borne with good results noted and S/P Right RFA with Dr. Marica Shoals on 12/14,2023, with good relief noted.  Refilled: MS Contin  30 mg one tablet every 12 hours  #60 and Oxycodone  10 mg one tablet every 6 hours as needed for pain #120. We will continue the opioid monitoring program, this consists of regular clinic visits, examinations, urine drug screen, pill counts as well as use of [place]  Controlled Substance Reporting system. A 12 month History has been reviewed on the [place]  Controlled Substance Reporting System on 09/15/2023. 2. Sacroiliac Dysfunction: Continue with current medication, heat and exercise regime. 09/15/2023 3. Muscle Spasm: Continue Tizanidine  as needed. Continue to  Monitor.09/15/2023 4. Chronic Pain Syndrome: Continue MS Contin  and Oxycodone  as needed for pain. Continue to Monitor. 09/15/2023. 5. Left Greater Trochanteric Tenderness: Left Hip Osteoarthritis: No complaints today.  S/P  left intra-articular hip injection  with 8 days of relief Mr. Scobie reports. Continue with alternating Ice and Heat Therapy. Continue to monitor. 09/15/2023. 6. Bradycardia: He's prescribed Coreg and his  PCP and Gastroenterologist Following he states. We will continue to monitor.09/15/2023 7. Weight Loss:  Gastroenterologist Following. Mr. Wenzlick reports  he was instructed to lose weight by his gastroenterologist , he is following a healthy diet regime and exercise. We will continue to monitor. 09/15/2023.   F/U in 1 month

## 2023-10-13 ENCOUNTER — Encounter: Attending: Physical Medicine & Rehabilitation | Admitting: Registered Nurse

## 2023-10-13 VITALS — BP 103/67 | HR 58 | Ht 73.0 in | Wt 243.0 lb

## 2023-10-13 DIAGNOSIS — M7061 Trochanteric bursitis, right hip: Secondary | ICD-10-CM | POA: Diagnosis not present

## 2023-10-13 DIAGNOSIS — M5416 Radiculopathy, lumbar region: Secondary | ICD-10-CM | POA: Diagnosis not present

## 2023-10-13 DIAGNOSIS — M961 Postlaminectomy syndrome, not elsewhere classified: Secondary | ICD-10-CM | POA: Diagnosis not present

## 2023-10-13 DIAGNOSIS — Z79891 Long term (current) use of opiate analgesic: Secondary | ICD-10-CM | POA: Insufficient documentation

## 2023-10-13 DIAGNOSIS — M7062 Trochanteric bursitis, left hip: Secondary | ICD-10-CM | POA: Diagnosis not present

## 2023-10-13 DIAGNOSIS — Z5181 Encounter for therapeutic drug level monitoring: Secondary | ICD-10-CM | POA: Insufficient documentation

## 2023-10-13 DIAGNOSIS — R001 Bradycardia, unspecified: Secondary | ICD-10-CM | POA: Insufficient documentation

## 2023-10-13 DIAGNOSIS — G894 Chronic pain syndrome: Secondary | ICD-10-CM | POA: Insufficient documentation

## 2023-10-13 MED ORDER — MORPHINE SULFATE ER 30 MG PO TBCR: 30.0000 mg | EXTENDED_RELEASE_TABLET | Freq: Two times a day (BID) | ORAL | 0 refills | Status: DC

## 2023-10-13 NOTE — Progress Notes (Unsigned)
 Subjective:    Patient ID: Samuel [name], male    DOB: 07-Jun-1975, 48 y.o.   MRN: [id number]  HPI: Samuel [name] is a 48 y.o. male who returns for follow up appointment for chronic pain and medication refill. states *** pain is located in  ***. rates pain ***. current exercise regime is walking and performing stretching exercises.  Samuel [name] Morphine  equivalent is *** MME.   Last UDS was Performed on 07/05/2023, it was consistent.     Pain Inventory Average Pain 5 Pain Right Now 6 My pain is constant, sharp, burning, dull, stabbing, and aching  In the last 24 hours, has pain interfered with the following? General activity 9 Relation with others 9 Enjoyment of life 9 What TIME of day is your pain at its worst? morning , evening, and night Sleep (in general) Fair  Pain is worse with: walking, bending, sitting, inactivity, and standing Pain improves with: rest and medication Relief from Meds: 5  Family History  Problem Relation Age of Onset   Hypertension Father    Social History   Socioeconomic History   Marital status: Married    Spouse name: Not on file   Number of children: Not on file   Years of education: Not on file   Highest education level: Not on file  Occupational History   Not on file  Tobacco Use   Smoking status: Never   Smokeless tobacco: Never  Vaping Use   Vaping status: Never Used  Substance and Sexual Activity   Alcohol  use: No    Alcohol /week: 0.0 standard drinks of alcohol    Drug use: No   Sexual activity: Not on file  Other Topics Concern   Not on file  Social History Narrative   Not on file   Social Drivers of Health   Financial Resource Strain: Medium Risk (07/22/2023)   Received from [facility]   Overall Financial Resource Strain (CARDIA)    Difficulty of Paying Living Expenses: Somewhat hard  Food Insecurity: Food Insecurity Present (07/22/2023)   Received from [facility]   Hunger Vital Sign    Worried About Running  Out of Food in the Last Year: Sometimes true    Ran Out of Food in the Last Year: Sometimes true  Transportation Needs: No Transportation Needs (07/22/2023)   Received from [facility] - Transportation    Lack of Transportation (Medical): No    Lack of Transportation (Non-Medical): No  Physical Activity: Unknown (07/22/2023)   Received from [facility]   Exercise Vital Sign    Days of Exercise per Week: 0 days    Minutes of Exercise per Session: Not on file  Stress: Stress Concern Present (07/22/2023)   Received from [facility] - Occupational Stress Questionnaire    Feeling of Stress : Rather much  Social Connections: Moderately Integrated (07/22/2023)   Received from [facility]   Social Network    How would you rate your social network (family, work, friends)?: Adequate participation with social networks   Past Surgical History:  Procedure Laterality Date   BACK SURGERY     lower back surgery x 2   Past Surgical History:  Procedure Laterality Date   BACK SURGERY     lower back surgery x 2   Past Medical History:  Diagnosis Date   Arthritis    "some in my back"   GERD (gastroesophageal reflux disease)    Headache(784.0)  BP 103/67   Pulse (!) 57   Ht 6\' 1"  (1.854 m)   Wt 243 lb (110.2 kg)   SpO2 99%   BMI 32.06 kg/m   Opioid Risk Score:   Fall Risk Score:  `1  Depression screen [facility] 2/9     09/15/2023    9:40 AM 08/11/2023    9:07 AM 06/09/2023    9:28 AM 05/12/2023    8:57 AM 02/10/2023    8:58 AM 10/07/2022    9:14 AM 08/07/2022    9:45 AM  Depression screen PHQ 2/9  Decreased Interest  0 0 0 0 0 1  Down, Depressed, Hopeless 0 0 0 0 0 0 1  PHQ - 2 Score 0 0 0 0 0 0 2  Altered sleeping 0        PHQ-9 Score 0           Review of Systems  Musculoskeletal:  Positive for back pain and gait problem.       Bilateral hip pain  All other systems reviewed and are negative.     Objective:    Physical Exam        Assessment & Plan:  1.  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 He has lumbar degenerative disc disease. Continue current medication regimen with  Gabapentin , Continue current Medication regimen . S/P  Left  Lumbar Facet Radiofrequency Ablation with sedation, on 04/21/2022 by Dr. Barth Borne with good results noted and S/P Right RFA with Dr. Marica Shoals on 12/14,2023, with good relief noted.  Refilled: MS Contin  30 mg one tablet every 12 hours  #60 and Oxycodone  10 mg one tablet every 6 hours as needed for pain #120. We will continue the opioid monitoring program, this consists of regular clinic visits, examinations, urine drug screen, pill counts as well as use of [place]  Controlled Substance Reporting system. A 12 month History has been reviewed on the [place]  Controlled Substance Reporting System on 09/15/2023. 2. Sacroiliac Dysfunction: Continue with current medication, heat and exercise regime. 09/15/2023 3. Muscle Spasm: Continue Tizanidine  as needed. Continue to Monitor.09/15/2023 4. Chronic Pain Syndrome: Continue MS Contin  and Oxycodone  as needed for pain. Continue to Monitor. 09/15/2023. 5. Left Greater Trochanteric Tenderness: Left Hip Osteoarthritis: No complaints today.  S/P  left intra-articular hip injection  with 8 days of relief Samuel [name] reports. Continue with alternating Ice and Heat Therapy. Continue to monitor. 09/15/2023. 6. Bradycardia: He's prescribed Coreg and his  PCP and Gastroenterologist Following he states. We will continue to monitor.09/15/2023 7. Weight Loss:  Gastroenterologist Following. Samuel [name] reports  he was instructed to lose weight by his gastroenterologist , he is following a healthy diet regime and exercise. We will continue to monitor. 09/15/2023.   F/U in 1 month

## 2023-10-14 ENCOUNTER — Other Ambulatory Visit: Payer: Self-pay | Admitting: Registered Nurse

## 2023-10-18 ENCOUNTER — Encounter: Payer: Self-pay | Admitting: Registered Nurse

## 2023-11-09 NOTE — Progress Notes (Signed)
 Subjective:    Patient ID: Samuel [name], male    DOB: 08-Jun-1975, 48 y.o.   MRN: [id number]  HPI: Samuel [name] is a 48 y.o. male who returns for follow up appointment for chronic pain and medication refill. He states his  pain is located in his lower back radiating into her bilateral lower extremities. He rates his pain 6. His current exercise regime is walking and performing stretching exercises.  Samuel [name] arrived bradycardic, apical pulse checked, PCP and Gastro following. Continue to monitor.   Samuel [name]  equivalent is 120.00 MME.   UDS ordered today.      Pain Inventory Average Pain 7 Pain Right Now 6 My pain is constant, sharp, burning, dull, stabbing, and aching  In the last 24 hours, has pain interfered with the following? General activity 9 Relation with others 9 Enjoyment of life 9 What TIME of day is your pain at its worst? morning , evening, and night Sleep (in general) Fair  Pain is worse with: walking, bending, sitting, inactivity, and standing Pain improves with: rest and medication Relief from Meds: 5  Family History  Problem Relation Age of Onset   Hypertension Father    Social History   Socioeconomic History   Marital status: Married    Spouse name: Not on file   Number of children: Not on file   Years of education: Not on file   Highest education level: Not on file  Occupational History   Not on file  Tobacco Use   Smoking status: Never   Smokeless tobacco: Never  Vaping Use   Vaping status: Never Used  Substance and Sexual Activity   Alcohol  use: No    Alcohol /week: 0.0 standard drinks of alcohol    Drug use: No   Sexual activity: Not on file  Other Topics Concern   Not on file  Social History Narrative   Not on file   Social Drivers of Health   Financial Resource Strain: Medium Risk (07/22/2023)   Received from [facility]   Overall Financial Resource Strain (CARDIA)    Difficulty of Paying Living Expenses:  Somewhat hard  Food Insecurity: Food Insecurity Present (07/22/2023)   Received from [facility]   Hunger Vital Sign    Within the past 12 months, you worried that your food would run out before you got the money to buy more.: Sometimes true    Within the past 12 months, the food you bought just didn't last and you didn't have money to get more.: Sometimes true  Transportation Needs: No Transportation Needs (07/22/2023)   Received from [facility] - Transportation    Lack of Transportation (Medical): No    Lack of Transportation (Non-Medical): No  Physical Activity: Unknown (07/22/2023)   Received from [facility]   Exercise Vital Sign    On average, how many days per week do you engage in moderate to strenuous exercise (like a brisk walk)?: 0 days    Minutes of Exercise per Session: Not on file  Stress: Stress Concern Present (07/22/2023)   Received from [facility] of Occupational Health - Occupational Stress Questionnaire    Feeling of Stress : Rather much  Social Connections: Moderately Integrated (07/22/2023)   Received from [facility]   Social Network    How would you rate your social network (family, work, friends)?: Adequate participation with social networks   Past Surgical History:  Procedure Laterality Date   BACK  SURGERY     lower back surgery x 2   Past Surgical History:  Procedure Laterality Date   BACK SURGERY     lower back surgery x 2   Past Medical History:  Diagnosis Date   Arthritis    some in my back   GERD (gastroesophageal reflux disease)    Headache(784.0)    There were no vitals taken for this visit.  Opioid Risk Score:   Fall Risk Score:  `1  Depression screen [facility] 2/9     09/15/2023    9:40 AM 08/11/2023    9:07 AM 06/09/2023    9:28 AM 05/12/2023    8:57 AM 02/10/2023    8:58 AM 10/07/2022    9:14 AM 08/07/2022    9:45 AM  Depression screen PHQ 2/9  Decreased Interest  0 0 0 0 0 1  Down, Depressed,  Hopeless 0 0 0 0 0 0 1  PHQ - 2 Score 0 0 0 0 0 0 2  Altered sleeping 0        PHQ-9 Score 0          Review of Systems  Musculoskeletal:  Positive for back pain and gait problem.       Pain in both hips & upper legs  All other systems reviewed and are negative.       Objective:   Physical Exam Vitals and nursing note reviewed.  Constitutional:      Appearance: Normal appearance.  Cardiovascular:     Rate and Rhythm: Normal rate and regular rhythm.     Pulses: Normal pulses.     Heart sounds: Normal heart sounds.  Pulmonary:     Effort: Pulmonary effort is normal.     Breath sounds: Normal breath sounds. No stridor.  Musculoskeletal:     Comments: Normal Muscle Bulk and Muscle Testing Reveals:  Upper Extremities: Full ROM and Muscle Strength 5/5  Lumbar Hypersensitivity Lower Extremities : Full ROM and Muscle Strength 5/5 Bilateral Lower Extremities Flexion Produces Pain into his Lumbar Arises from table slowly using cane for support Antalgic Gait     Skin:    General: Skin is warm and dry.  Neurological:     Mental Status: He is alert and oriented to person, place, and time.  Psychiatric:        Mood and Affect: Mood normal.        Behavior: Behavior normal.          Assessment & Plan:  1.  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 He has lumbar degenerative disc disease. Continue current medication regimen with  Gabapentin , Continue current Medication regimen . S/P  Left  Lumbar Facet Radiofrequency Ablation with sedation, on 04/21/2022 by Dr. Tanya with good results noted and S/P Right RFA with Dr. Kennard on 12/14,2023, with good relief noted.  Refilled: MS Contin  30 mg one tablet every 12 hours  #60 and Oxycodone  10 mg one tablet every 6 hours as needed for pain #120. We will continue the opioid monitoring program, this consists of regular clinic visits, examinations, urine drug screen, pill counts as well as use of [place] Controlled Substance Reporting system. A 12 month History has been reviewed on the [place]  Controlled Substance Reporting System on 11/10/2023. 2. Sacroiliac Dysfunction: Continue with current medication, heat and exercise regime. 11/10/2023 3. Muscle Spasm: Continue Tizanidine  as needed. Continue to Monitor.11/10/2023 4. Chronic Pain Syndrome: Continue MS Contin  and Oxycodone  as needed  for pain. Continue to Monitor. 11/10/2023. 5. Left Greater Trochanteric Tenderness: Left Hip Osteoarthritis: No complaints today.  S/P  left intra-articular hip injection  with 8 days of relief Samuel [name] reports. Continue with alternating Ice and Heat Therapy. Continue to monitor. 11/10/2023. 6. Bradycardia: He's prescribed Coreg and his  PCP and Gastroenterologist Following he states. We will continue to monitor.11/10/2023 7. Weight Loss:  Gastroenterologist Following. Samuel [name] reports  he was instructed to lose weight by his gastroenterologist , he is following a healthy diet regime and exercise. We will continue to monitor. 11/10/2023.   F/U in 1 month

## 2023-11-10 ENCOUNTER — Encounter: Attending: Physical Medicine & Rehabilitation | Admitting: Registered Nurse

## 2023-11-10 ENCOUNTER — Encounter: Payer: Self-pay | Admitting: Registered Nurse

## 2023-11-10 VITALS — BP 104/64 | HR 56 | Ht 73.0 in | Wt 246.0 lb

## 2023-11-10 DIAGNOSIS — Z5181 Encounter for therapeutic drug level monitoring: Secondary | ICD-10-CM | POA: Insufficient documentation

## 2023-11-10 DIAGNOSIS — G894 Chronic pain syndrome: Secondary | ICD-10-CM | POA: Insufficient documentation

## 2023-11-10 DIAGNOSIS — R001 Bradycardia, unspecified: Secondary | ICD-10-CM | POA: Insufficient documentation

## 2023-11-10 DIAGNOSIS — M5416 Radiculopathy, lumbar region: Secondary | ICD-10-CM | POA: Diagnosis not present

## 2023-11-10 DIAGNOSIS — M7061 Trochanteric bursitis, right hip: Secondary | ICD-10-CM | POA: Insufficient documentation

## 2023-11-10 DIAGNOSIS — Z79891 Long term (current) use of opiate analgesic: Secondary | ICD-10-CM | POA: Diagnosis not present

## 2023-11-10 DIAGNOSIS — M7062 Trochanteric bursitis, left hip: Secondary | ICD-10-CM | POA: Diagnosis not present

## 2023-11-10 DIAGNOSIS — M961 Postlaminectomy syndrome, not elsewhere classified: Secondary | ICD-10-CM | POA: Diagnosis not present

## 2023-11-10 MED ORDER — MORPHINE SULFATE ER 30 MG PO TBCR: 30.0000 mg | EXTENDED_RELEASE_TABLET | Freq: Two times a day (BID) | ORAL | 0 refills | Status: DC

## 2023-11-10 MED ORDER — OXYCODONE HCL 10 MG PO TABS: 10.0000 mg | ORAL_TABLET | Freq: Four times a day (QID) | ORAL | 0 refills | Status: DC | PRN

## 2023-11-16 LAB — TOXASSURE SELECT,+ANTIDEPR,UR

## 2023-12-08 ENCOUNTER — Encounter: Payer: Self-pay | Admitting: Registered Nurse

## 2023-12-08 ENCOUNTER — Encounter: Attending: Physical Medicine & Rehabilitation | Admitting: Registered Nurse

## 2023-12-08 VITALS — BP 105/65 | HR 64 | Ht 73.0 in | Wt 244.4 lb

## 2023-12-08 DIAGNOSIS — Z5181 Encounter for therapeutic drug level monitoring: Secondary | ICD-10-CM | POA: Insufficient documentation

## 2023-12-08 DIAGNOSIS — G894 Chronic pain syndrome: Secondary | ICD-10-CM | POA: Diagnosis not present

## 2023-12-08 DIAGNOSIS — M961 Postlaminectomy syndrome, not elsewhere classified: Secondary | ICD-10-CM | POA: Insufficient documentation

## 2023-12-08 DIAGNOSIS — M7062 Trochanteric bursitis, left hip: Secondary | ICD-10-CM | POA: Diagnosis not present

## 2023-12-08 DIAGNOSIS — M7061 Trochanteric bursitis, right hip: Secondary | ICD-10-CM | POA: Insufficient documentation

## 2023-12-08 DIAGNOSIS — Z79891 Long term (current) use of opiate analgesic: Secondary | ICD-10-CM | POA: Diagnosis not present

## 2023-12-08 DIAGNOSIS — M5416 Radiculopathy, lumbar region: Secondary | ICD-10-CM | POA: Insufficient documentation

## 2023-12-08 MED ORDER — MORPHINE SULFATE ER 30 MG PO TBCR: 30.0000 mg | EXTENDED_RELEASE_TABLET | Freq: Two times a day (BID) | ORAL | 0 refills | Status: DC

## 2023-12-08 MED ORDER — OXYCODONE HCL 10 MG PO TABS: 10.0000 mg | ORAL_TABLET | Freq: Four times a day (QID) | ORAL | 0 refills | Status: DC | PRN

## 2023-12-08 NOTE — Progress Notes (Signed)
 Subjective:    Patient ID: Samuel [name], male    DOB: [birth year], 48 y.o.   MRN: [id number]  HPI: [name] is a 48 y.o. male who returns for follow up appointment for chronic pain and medication refill. He states his pain is located in his lower back radiating into his bilateral hips and bilateral lower extremities. He rates his pain 6.His  current exercise regime is walking and performing stretching exercises.  Mr. Meir Morphine  equivalent is 120.00 MME.   Last UDS was Performed on 11/10/2023, it was consistent.    Pain Inventory Average Pain 6 Pain Right Now 6 My pain is constant, sharp, burning, dull, stabbing, and aching  In the last 24 hours, has pain interfered with the following? General activity 9 Relation with others 9 Enjoyment of life 9 What TIME of day is your pain at its worst? morning , evening, and night Sleep (in general) Fair  Pain is worse with: walking, bending, sitting, inactivity, and standing Pain improves with: rest and medication Relief from Meds: 5  Family History  Problem Relation Age of Onset   Hypertension Father    Social History   Socioeconomic History   Marital status: Married    Spouse name: Not on file   Number of children: Not on file   Years of education: Not on file   Highest education level: Not on file  Occupational History   Not on file  Tobacco Use   Smoking status: Never   Smokeless tobacco: Never  Vaping Use   Vaping status: Never Used  Substance and Sexual Activity   Alcohol  use: No    Alcohol /week: 0.0 standard drinks of alcohol    Drug use: No   Sexual activity: Not on file  Other Topics Concern   Not on file  Social History Narrative   Not on file   Social Drivers of Health   Financial Resource Strain: Medium Risk (07/22/2023)   Received from [facility]   Overall Financial Resource Strain (CARDIA)    Difficulty of Paying Living Expenses: Somewhat hard  Food Insecurity: Food Insecurity Present  (07/22/2023)   Received from [facility]   Hunger Vital Sign    Within the past 12 months, you worried that your food would run out before you got the money to buy more.: Sometimes true    Within the past 12 months, the food you bought just didn't last and you didn't have money to get more.: Sometimes true  Transportation Needs: No Transportation Needs (07/22/2023)   Received from [facility] - Transportation    Lack of Transportation (Medical): No    Lack of Transportation (Non-Medical): No  Physical Activity: Unknown (07/22/2023)   Received from [facility]   Exercise Vital Sign    On average, how many days per week do you engage in moderate to strenuous exercise (like a brisk walk)?: 0 days    Minutes of Exercise per Session: Not on file  Stress: Stress Concern Present (07/22/2023)   Received from [facility] of Occupational Health - Occupational Stress Questionnaire    Feeling of Stress : Rather much  Social Connections: Moderately Integrated (07/22/2023)   Received from [facility]   Social Network    How would you rate your social network (family, work, friends)?: Adequate participation with social networks   Past Surgical History:  Procedure Laterality Date   BACK SURGERY     lower back surgery x 2  Past Surgical History:  Procedure Laterality Date   BACK SURGERY     lower back surgery x 2   Past Medical History:  Diagnosis Date   Arthritis    some in my back   GERD (gastroesophageal reflux disease)    Headache(784.0)    BP 105/65 (BP Location: Left Arm, Patient Position: Sitting, Cuff Size: Large)   Pulse (!) 59   Ht 6' 1 (1.854 m)   Wt 244 lb 6.4 oz (110.9 kg)   SpO2 98%   BMI 32.24 kg/m   Opioid Risk Score:   Fall Risk Score:  `1  Depression screen [facility] 2/9     12/08/2023    9:22 AM 11/10/2023    9:26 AM 09/15/2023    9:40 AM 08/11/2023    9:07 AM 06/09/2023    9:28 AM 05/12/2023    8:57 AM 02/10/2023    8:58 AM   Depression screen PHQ 2/9  Decreased Interest 1 1  0 0 0 0  Down, Depressed, Hopeless 0 1 0 0 0 0 0  PHQ - 2 Score 1 2 0 0 0 0 0  Altered sleeping 1  0      Tired, decreased energy 1        Change in appetite 1        Feeling bad or failure about yourself  0        Trouble concentrating 0        Moving slowly or fidgety/restless 0        Suicidal thoughts 0        PHQ-9 Score 4  0          Review of Systems  Musculoskeletal:  Positive for back pain and myalgias.       Lower back pain radiating into both legs  All other systems reviewed and are negative.      Objective:   Physical Exam Vitals and nursing note reviewed.  Constitutional:      Appearance: Normal appearance.  Cardiovascular:     Rate and Rhythm: Normal rate and regular rhythm.     Pulses: Normal pulses.     Heart sounds: Normal heart sounds.  Pulmonary:     Effort: Pulmonary effort is normal.     Breath sounds: Normal breath sounds.  Musculoskeletal:     Comments: Normal Muscle Bulk and Muscle Testing Reveals:  Upper Extremities: Full ROM and Muscle Strength 5/5  Thoracic Paraspinal Tenderness: T-1-T-2 Lumbar Hypersensitivity Bilateral Greater trochanter Tenderness Lower Extremities: Decreased ROM and Muscle Strength 5/5 Bilateral Lower Extremities Flexion Produces Pain into his Lumbar Arises from Table slowly using cane for support' Antalgic Gait     Skin:    General: Skin is warm and dry.  Neurological:     Mental Status: He is alert and oriented to person, place, and time.  Psychiatric:        Mood and Affect: Mood normal.        Behavior: Behavior normal.          Assessment & Plan:  1.  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 He has lumbar degenerative disc disease. Continue current medication regimen with  Gabapentin , Continue current Medication regimen . S/P  Left  Lumbar Facet Radiofrequency Ablation with sedation, on 04/21/2022 by Dr. Tanya with good  results noted and S/P Right RFA with Dr. Kennard on 12/14,2023, with good relief noted.  Refilled: MS Contin  30 mg one tablet every 12  hours  #60 and Oxycodone  10 mg one tablet every 6 hours as needed for pain #120. We will continue the opioid monitoring program, this consists of regular clinic visits, examinations, urine drug screen, pill counts as well as use of [place]  Controlled Substance Reporting system. A 12 month History has been reviewed on the [place]  Controlled Substance Reporting System on 12/08/2023. 2. Sacroiliac Dysfunction: Continue with current medication, heat and exercise regime. 12/08/2023 3. Muscle Spasm: Continue Tizanidine  as needed. Continue to Monitor.12/08/2023 4. Chronic Pain Syndrome: Continue MS Contin  and Oxycodone  as needed for pain. Continue to Monitor. 12/08/2023. 5. Left Greater Trochanteric Tenderness: Left Hip Osteoarthritis: No complaints today.  S/P  left intra-articular hip injection  with 8 days of relief Mr. Burdell reports. Continue with alternating Ice and Heat Therapy. Continue to monitor. 12/08/2023. 6. Bradycardia: Apical Pulse Checked. He's prescribed Coreg and his  PCP and Gastroenterologist Following he states. We will continue to monitor.12/08/2023 7. Weight Loss:  Gastroenterologist Following. Mr. Crill reports  he was instructed to lose weight by his gastroenterologist , he is following a healthy diet regime and exercise. We will continue to monitor. 12/08/2023.   F/U in 1 month

## 2023-12-22 DIAGNOSIS — K7469 Other cirrhosis of liver: Secondary | ICD-10-CM | POA: Diagnosis not present

## 2023-12-22 DIAGNOSIS — E66811 Obesity, class 1: Secondary | ICD-10-CM | POA: Diagnosis not present

## 2023-12-22 DIAGNOSIS — K7581 Nonalcoholic steatohepatitis (NASH): Secondary | ICD-10-CM | POA: Diagnosis not present

## 2023-12-22 DIAGNOSIS — I8510 Secondary esophageal varices without bleeding: Secondary | ICD-10-CM | POA: Diagnosis not present

## 2023-12-22 DIAGNOSIS — E119 Type 2 diabetes mellitus without complications: Secondary | ICD-10-CM | POA: Diagnosis not present

## 2023-12-22 DIAGNOSIS — K766 Portal hypertension: Secondary | ICD-10-CM | POA: Diagnosis not present

## 2024-01-05 ENCOUNTER — Encounter: Payer: Self-pay | Admitting: Registered Nurse

## 2024-01-05 ENCOUNTER — Encounter: Attending: Physical Medicine & Rehabilitation | Admitting: Registered Nurse

## 2024-01-05 VITALS — BP 113/62 | HR 61 | Ht 73.0 in | Wt 250.2 lb

## 2024-01-05 DIAGNOSIS — M961 Postlaminectomy syndrome, not elsewhere classified: Secondary | ICD-10-CM | POA: Diagnosis present

## 2024-01-05 DIAGNOSIS — M7061 Trochanteric bursitis, right hip: Secondary | ICD-10-CM | POA: Diagnosis not present

## 2024-01-05 DIAGNOSIS — M5416 Radiculopathy, lumbar region: Secondary | ICD-10-CM | POA: Diagnosis not present

## 2024-01-05 DIAGNOSIS — G894 Chronic pain syndrome: Secondary | ICD-10-CM | POA: Diagnosis present

## 2024-01-05 DIAGNOSIS — Z79891 Long term (current) use of opiate analgesic: Secondary | ICD-10-CM | POA: Diagnosis not present

## 2024-01-05 DIAGNOSIS — Z5181 Encounter for therapeutic drug level monitoring: Secondary | ICD-10-CM | POA: Insufficient documentation

## 2024-01-05 DIAGNOSIS — M7062 Trochanteric bursitis, left hip: Secondary | ICD-10-CM | POA: Insufficient documentation

## 2024-01-05 MED ORDER — OXYCODONE HCL 10 MG PO TABS: 10.0000 mg | ORAL_TABLET | Freq: Four times a day (QID) | ORAL | 0 refills | Status: DC | PRN

## 2024-01-05 MED ORDER — MORPHINE SULFATE ER 30 MG PO TBCR: 30.0000 mg | EXTENDED_RELEASE_TABLET | Freq: Two times a day (BID) | ORAL | 0 refills | Status: DC

## 2024-01-05 NOTE — Progress Notes (Signed)
 Subjective:    Patient ID: Samuel [name], male    DOB: May 08, 1976, 48 y.o.   MRN: [id number]  HPI: Samuel [name] is a 48 y.o. male who returns for follow up appointment for chronic pain and medication refill. He states his pain is located in his lower back radiating into his bilateral hips and bilateral lower extremities. He rates his pain 6. His current exercise regime is walking and performing stretching exercises.  Mr. Kluever Morphine  equivalent is 120.00 MME.   Last UDS was Performed on 11/10/2023, it was consistent.    Pain Inventory Average Pain 6 Pain Right Now 6 My pain is constant, sharp, burning, dull, stabbing, and aching  In the last 24 hours, has pain interfered with the following? General activity 9 Relation with others 9 Enjoyment of life 9 What TIME of day is your pain at its worst? morning , evening, and night Sleep (in general) Fair  Pain is worse with: walking, bending, sitting, inactivity, and standing Pain improves with: rest and medication Relief from Meds: 5  Family History  Problem Relation Age of Onset   Hypertension Father    Social History   Socioeconomic History   Marital status: Married    Spouse name: Not on file   Number of children: Not on file   Years of education: Not on file   Highest education level: Not on file  Occupational History   Not on file  Tobacco Use   Smoking status: Never   Smokeless tobacco: Never  Vaping Use   Vaping status: Never Used  Substance and Sexual Activity   Alcohol  use: No    Alcohol /week: 0.0 standard drinks of alcohol    Drug use: No   Sexual activity: Not on file  Other Topics Concern   Not on file  Social History Narrative   Not on file   Social Drivers of Health   Financial Resource Strain: Medium Risk (07/22/2023)   Received from [name]   Overall Financial Resource Strain (CARDIA)    Difficulty of Paying Living Expenses: Somewhat hard  Food Insecurity: Food Insecurity Present  (07/22/2023)   Received [facility]   Hunger Vital Sign    Within the past 12 months, you worried that your food would run out before you got the money to buy more.: Sometimes true    Within the past 12 months, the food you bought just didn't last and you didn't have money to get more.: Sometimes true  Transportation Needs: No Transportation Needs (07/22/2023)   Received from [facility] - Transportation    Lack of Transportation (Medical): No    Lack of Transportation (Non-Medical): No  Physical Activity: Unknown (07/22/2023)   Received from [facility]   Exercise Vital Sign    On average, how many days per week do you engage in moderate to strenuous exercise (like a brisk walk)?: 0 days    Minutes of Exercise per Session: Not on file  Stress: Stress Concern Present (07/22/2023)   Received from [facility] of Occupational Health - Occupational Stress Questionnaire    Feeling of Stress : Rather much  Social Connections: Moderately Integrated (07/22/2023)   Received from [facility]   Social Network    How would you rate your social network (family, work, friends)?: Adequate participation with social networks   Past Surgical History:  Procedure Laterality Date   BACK SURGERY     lower back surgery x 2  Past Surgical History:  Procedure Laterality Date   BACK SURGERY     lower back surgery x 2   Past Medical History:  Diagnosis Date   Arthritis    some in my back   GERD (gastroesophageal reflux disease)    Headache(784.0)    BP 113/62   Pulse 61   Ht 6' 1 (1.854 m)   Wt 250 lb 3.2 oz (113.5 kg)   SpO2 98%   BMI 33.01 kg/m   Opioid Risk Score:   Fall Risk Score:  `1  Depression screen [facility] 2/9     01/05/2024    9:19 AM 12/08/2023    9:22 AM 11/10/2023    9:26 AM 09/15/2023    9:40 AM 08/11/2023    9:07 AM 06/09/2023    9:28 AM 05/12/2023    8:57 AM  Depression screen PHQ 2/9  Decreased Interest 1 1 1   0 0 0  Down,  Depressed, Hopeless 1 0 1 0 0 0 0  PHQ - 2 Score 2 1 2  0 0 0 0  Altered sleeping  1  0     Tired, decreased energy  1       Change in appetite  1       Feeling bad or failure about yourself   0       Trouble concentrating  0       Moving slowly or fidgety/restless  0       Suicidal thoughts  0       PHQ-9 Score  4  0         Review of Systems  Musculoskeletal:  Positive for back pain and gait problem.  All other systems reviewed and are negative.      Objective:   Physical Exam Vitals and nursing note reviewed.  Constitutional:      Appearance: Normal appearance.  Cardiovascular:     Rate and Rhythm: Normal rate and regular rhythm.     Pulses: Normal pulses.     Heart sounds: Normal heart sounds.  Pulmonary:     Effort: Pulmonary effort is normal.     Breath sounds: Normal breath sounds.  Musculoskeletal:     Comments: Normal Muscle Bulk and Muscle Testing Reveals:  Upper Extremities: Full ROM and Muscle Strength 5/5  Lumbar Hypersensitivity Bilateral Greater Trochanter Tenderness Lower Extremities: Decreased ROM and Muscle Strength 5/5 Bilateral Lower Extremities Flexion Produces Pain into his Lumbar Arises from Table slowly using cane for support Antalgic  Gait     Skin:    General: Skin is warm and dry.  Neurological:     Mental Status: He is alert and oriented to person, place, and time.  Psychiatric:        Mood and Affect: Mood normal.        Behavior: Behavior normal.          Assessment & Plan:  1.  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 He has lumbar degenerative disc disease. Continue current medication regimen with  Gabapentin , Continue current Medication regimen . S/P  Left  Lumbar Facet Radiofrequency Ablation with sedation, on 04/21/2022 by Dr. Tanya with good results noted and S/P Right RFA with Dr. Kennard on 12/14,2023, with good relief noted.  Refilled: MS Contin  30 mg one tablet every 12 hours  #60 and  Oxycodone  10 mg one tablet every 6 hours as needed for pain #120. We will continue the opioid monitoring program, this consists  of regular clinic visits, examinations, urine drug screen, pill counts as well as use of [place]  Controlled Substance Reporting system. A 12 month History has been reviewed on the [place]  Controlled Substance Reporting System on 01/05/2024. 2. Sacroiliac Dysfunction: Continue with current medication, heat and exercise regime. 01/05/2024 3. Muscle Spasm: Continue Tizanidine  as needed. Continue to Monitor.01/05/2024 4. Chronic Pain Syndrome: Continue MS Contin  and Oxycodone  as needed for pain. Continue to Monitor. 01/05/2024. 5. Left Greater Trochanteric Tenderness: Left Hip Osteoarthritis: No complaints today.  S/P  left intra-articular hip injection  with 8 days of relief Mr. Struble reports. Continue with alternating Ice and Heat Therapy. Continue to monitor. 01/05/2024. 6. Bradycardia: No Bradycardial today, Mr. Gonzales states his medication was changed by his GI Physcian He's prescribed Coreg and his  PCP and Gastroenterologist Following he states. We will continue to monitor.01/05/2024 7. Weight Loss:  Gastroenterologist Following. He gained 5.8 lbs. Mr. Ergle reports  he was instructed to lose weight by his gastroenterologist , he is following a healthy diet regime and exercise. We will continue to monitor. 01/05/2024.   F/U in 1 month

## 2024-01-14 ENCOUNTER — Other Ambulatory Visit: Payer: Self-pay | Admitting: Registered Nurse

## 2024-02-09 ENCOUNTER — Encounter: Payer: Self-pay | Admitting: Registered Nurse

## 2024-02-09 ENCOUNTER — Encounter: Attending: Physical Medicine & Rehabilitation | Admitting: Registered Nurse

## 2024-02-09 VITALS — BP 123/72 | HR 60 | Ht 73.0 in | Wt 246.0 lb

## 2024-02-09 DIAGNOSIS — G894 Chronic pain syndrome: Secondary | ICD-10-CM | POA: Diagnosis not present

## 2024-02-09 DIAGNOSIS — Z5181 Encounter for therapeutic drug level monitoring: Secondary | ICD-10-CM | POA: Insufficient documentation

## 2024-02-09 DIAGNOSIS — M5416 Radiculopathy, lumbar region: Secondary | ICD-10-CM | POA: Insufficient documentation

## 2024-02-09 DIAGNOSIS — M7061 Trochanteric bursitis, right hip: Secondary | ICD-10-CM | POA: Diagnosis not present

## 2024-02-09 DIAGNOSIS — M961 Postlaminectomy syndrome, not elsewhere classified: Secondary | ICD-10-CM | POA: Insufficient documentation

## 2024-02-09 DIAGNOSIS — Z79891 Long term (current) use of opiate analgesic: Secondary | ICD-10-CM | POA: Insufficient documentation

## 2024-02-09 DIAGNOSIS — M7062 Trochanteric bursitis, left hip: Secondary | ICD-10-CM | POA: Insufficient documentation

## 2024-02-09 MED ORDER — OXYCODONE HCL 10 MG PO TABS: 10.0000 mg | ORAL_TABLET | Freq: Four times a day (QID) | ORAL | 0 refills | Status: DC | PRN

## 2024-02-09 MED ORDER — MORPHINE SULFATE ER 30 MG PO TBCR: 30.0000 mg | EXTENDED_RELEASE_TABLET | Freq: Two times a day (BID) | ORAL | 0 refills | Status: DC

## 2024-02-09 NOTE — Progress Notes (Signed)
 Subjective:    Patient ID: Samuel [name], male    DOB: [birth year], 48 y.o.   MRN: [id number]  HPI: Samuel [name] is a 48 y.o. male who returns for follow up appointment for chronic pain and medication refill. He states his pain is located in his lower back radiating into his bilateral lower extremities. He rates his pain 6. His current exercise regime is walking and performing stretching exercises.  Samuel [name] Morphine  equivalent is 120.00 MME.   UDS ordered today.     Pain Inventory Average Pain 6 Pain Right Now 6 My pain is constant, sharp, burning, dull, stabbing, and aching  In the last 24 hours, has pain interfered with the following? General activity 9 Relation with others 9 Enjoyment of life 9 What TIME of day is your pain at its worst? morning , evening, and night Sleep (in general) Fair  Pain is worse with: walking, bending, sitting, inactivity, and standing Pain improves with: rest and medication Relief from Meds: 5  Family History  Problem Relation Age of Onset   Hypertension Father    Social History   Socioeconomic History   Marital status: Married    Spouse name: Not on file   Number of children: Not on file   Years of education: Not on file   Highest education level: Not on file  Occupational History   Not on file  Tobacco Use   Smoking status: Never   Smokeless tobacco: Never  Vaping Use   Vaping status: Never Used  Substance and Sexual Activity   Alcohol  use: No    Alcohol /week: 0.0 standard drinks of alcohol    Drug use: No   Sexual activity: Not on file  Other Topics Concern   Not on file  Social History Narrative   Not on file   Social Drivers of Health   Financial Resource Strain: Medium Risk (07/22/2023)   Received from [facility]   Overall Financial Resource Strain (CARDIA)    Difficulty of Paying Living Expenses: Somewhat hard  Food Insecurity: Food Insecurity Present (07/22/2023)   Received from [facility]   Hunger  Vital Sign    Within the past 12 months, you worried that your food would run out before you got the money to buy more.: Sometimes true    Within the past 12 months, the food you bought just didn't last and you didn't have money to get more.: Sometimes true  Transportation Needs: No Transportation Needs (07/22/2023)   Received from [facility] - [facility]    Lack of Transportation (Medical): No    Lack of Transportation (Non-Medical): No  Physical Activity: Unknown (07/22/2023)   Received from [facility]   Exercise Vital Sign    On average, how many days per week do you engage in moderate to strenuous exercise (like a brisk walk)?: 0 days    Minutes of Exercise per Session: Not on file  Stress: Stress Concern Present (07/22/2023)   Received from [facility] - Occupational Stress Questionnaire    Feeling of Stress : Rather much  Social Connections: Moderately Integrated (07/22/2023)   Received from [facility]   Social Network    How would you rate your social network (family, work, friends)?: Adequate participation with social networks   Past Surgical History:  Procedure Laterality Date   BACK SURGERY     lower back surgery x 2   Past Surgical History:  Procedure Laterality  Date   BACK SURGERY     lower back surgery x 2   Past Medical History:  Diagnosis Date   Arthritis    some in my back   GERD (gastroesophageal reflux disease)    Headache(784.0)    BP 123/72 (BP Location: Left Arm, Patient Position: Sitting, Cuff Size: Large)   Pulse 60   Ht 6' 1 (1.854 m)   Wt 246 lb (111.6 kg)   SpO2 99%   BMI 32.46 kg/m   Opioid Risk Score:   Fall Risk Score:  `1  Depression screen [facility] 2/9     02/09/2024    9:05 AM 01/05/2024    9:19 AM 12/08/2023    9:22 AM 11/10/2023    9:26 AM 09/15/2023    9:40 AM 08/11/2023    9:07 AM 06/09/2023    9:28 AM  Depression screen PHQ 2/9  Decreased Interest 0 1 1 1   0 0   Down, Depressed, Hopeless 0 1 0 1 0 0 0  PHQ - 2 Score 0 2 1 2  0 0 0  Altered sleeping 0  1  0    Tired, decreased energy 0  1      Change in appetite 0  1      Feeling bad or failure about yourself  0  0      Trouble concentrating 0  0      Moving slowly or fidgety/restless 0  0      Suicidal thoughts 0  0      PHQ-9 Score 0  4  0       Review of Systems  Musculoskeletal:  Positive for arthralgias and back pain.       Lower back pain, bilateral hip pain  All other systems reviewed and are negative.      Objective:   Physical Exam Vitals and nursing note reviewed.  Constitutional:      Appearance: Normal appearance.  Cardiovascular:     Rate and Rhythm: Normal rate and regular rhythm.     Pulses: Normal pulses.     Heart sounds: Normal heart sounds.  Pulmonary:     Effort: Pulmonary effort is normal.     Breath sounds: Normal breath sounds.  Musculoskeletal:     Comments: Normal Muscle Bulk and Muscle Testing Reveals:  Upper Extremities: Full ROM and Muscle Strength 5/5 Lumbar Hypersensitivity Bilateral Greater Trochanter Tenderness: R>L Lower Extremities: Decreased ROM and Muscle Strength  5/5 Bilateral Lower Extremities Flexion Produces Pain into his Lumbar Arises from table slowly Antalgic  Gait     Skin:    General: Skin is warm and dry.  Neurological:     Mental Status: He is alert and oriented to person, place, and time.  Psychiatric:        Mood and Affect: Mood normal.        Behavior: Behavior normal.          Assessment & Plan:  1.  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 He has lumbar degenerative disc disease. Continue current medication regimen with  Gabapentin , Continue current Medication regimen . S/P  Left  Lumbar Facet Radiofrequency Ablation with sedation, on 04/21/2022 by Dr. Tanya with good results noted and S/P Right RFA with Dr. Kennard on 12/14,2023, with good relief noted.  Refilled: MS Contin  30 mg  one tablet every 12 hours  #60 and Oxycodone  10 mg one tablet every 6 hours as needed for pain #120.  We will continue the opioid monitoring program, this consists of regular clinic visits, examinations, urine drug screen, pill counts as well as use of [place]  Controlled Substance Reporting system. A 12 month History has been reviewed on the [place]  Controlled Substance Reporting System on 02/09/2024. 2. Sacroiliac Dysfunction: Continue with current medication, heat and exercise regime. 02/09/2024 3. Muscle Spasm: Continue Tizanidine  as needed. Continue to Monitor.02/09/2024 4. Chronic Pain Syndrome: Continue MS Contin  and Oxycodone  as needed for pain. Continue to Monitor. 02/09/2024. 5. Left Greater Trochanteric Tenderness: Left Hip Osteoarthritis:  S/P  left intra-articular hip injection  with 8 days of relief Samuel [name] reports. Continue with alternating Ice and Heat Therapy. Continue to monitor. 02/09/2024. 6. Bradycardia: No Bradycardia today, Samuel [name] states his medication was changed by his GI Physcian He's prescribed Coreg and his  PCP and Gastroenterologist Following he states. We will continue to monitor.02/09/2024 7. Weight Loss:  Solicitor Following. . Samuel [name] reports  he was instructed to lose weight by his gastroenterologist , he is following a healthy diet regime and exercise. We will continue to monitor. 02/09/2024.   F/U in 1 month

## 2024-02-12 LAB — TOXASSURE SELECT,+ANTIDEPR,UR

## 2024-02-22 ENCOUNTER — Other Ambulatory Visit: Payer: Self-pay | Admitting: Physician Assistant

## 2024-02-22 DIAGNOSIS — K7469 Other cirrhosis of liver: Secondary | ICD-10-CM

## 2024-03-07 NOTE — Progress Notes (Unsigned)
 Subjective:    Patient ID: Samuel [name], male    DOB: 1975/10/16, 48 y.o.   MRN: [id number]  HPI: Samuel [name] is a 48 y.o. male who returns for follow up appointment for chronic pain and medication refill. He states his pain is located in his lower back radiating into his bilateral hips and bilateral lower extremities. He rates his pain 6. His current exercise regime is walking and performing stretching exercises.  Ms. Ohalloran Morphine  equivalent is 120.00 MME.   Last UDS was Performed on 02/09/2024, it was consistent.      Pain Inventory Average Pain 6 Pain Right Now 6 My pain is sharp, burning, dull, stabbing, and aching  In the last 24 hours, has pain interfered with the following? General activity 9 Relation with others 9 Enjoyment of life 9 What TIME of day is your pain at its worst? morning , evening, and night Sleep (in general) Fair  Pain is worse with: walking, bending, sitting, inactivity, and standing Pain improves with: rest and medication Relief from Meds: 6  Family History  Problem Relation Age of Onset   Hypertension Father    Social History   Socioeconomic History   Marital status: Married    Spouse name: Not on file   Number of children: Not on file   Years of education: Not on file   Highest education level: Not on file  Occupational History   Not on file  Tobacco Use   Smoking status: Never   Smokeless tobacco: Never  Vaping Use   Vaping status: Never Used  Substance and Sexual Activity   Alcohol  use: No    Alcohol /week: 0.0 standard drinks of alcohol    Drug use: No   Sexual activity: Not on file  Other Topics Concern   Not on file  Social History Narrative   Not on file   Social Drivers of Health   Financial Resource Strain: Medium Risk (07/22/2023)   Received from [facility]   Overall Financial Resource Strain (CARDIA)    Difficulty of Paying Living Expenses: Somewhat hard  Food Insecurity: Food Insecurity Present  (07/22/2023)   Received from [facility]   Hunger Vital Sign    Within the past 12 months, you worried that your food would run out before you got the money to buy more.: Sometimes true    Within the past 12 months, the food you bought just didn't last and you didn't have money to get more.: Sometimes true  Transportation Needs: No Transportation Needs (07/22/2023)   Received from [facility] - Transportation    Lack of Transportation (Medical): No    Lack of Transportation (Non-Medical): No  Physical Activity: Unknown (07/22/2023)   Received from [facility]   Exercise Vital Sign    On average, how many days per week do you engage in moderate to strenuous exercise (like a brisk walk)?: 0 days    Minutes of Exercise per Session: Not on file  Stress: Stress Concern Present (07/22/2023)   Received from [facility] - Occupational Stress Questionnaire    Feeling of Stress : Rather much  Social Connections: Moderately Integrated (07/22/2023)   Received from [facility]   Social Network    How would you rate your social network (family, work, friends)?: Adequate participation with social networks   Past Surgical History:  Procedure Laterality Date   BACK SURGERY     lower back surgery x  2   Past Surgical History:  Procedure Laterality Date   BACK SURGERY     lower back surgery x 2   Past Medical History:  Diagnosis Date   Arthritis    some in my back   GERD (gastroesophageal reflux disease)    Headache(784.0)    There were no vitals taken for this visit.  Opioid Risk Score:   Fall Risk Score:  `1  Depression screen [facility] 2/9     02/09/2024    9:05 AM 01/05/2024    9:19 AM 12/08/2023    9:22 AM 11/10/2023    9:26 AM 09/15/2023    9:40 AM 08/11/2023    9:07 AM 06/09/2023    9:28 AM  Depression screen PHQ 2/9  Decreased Interest 0 1 1 1   0 0  Down, Depressed, Hopeless 0 1 0 1 0 0 0  PHQ - 2 Score 0 2 1 2  0 0 0   Altered sleeping 0  1  0    Tired, decreased energy 0  1      Change in appetite 0  1      Feeling bad or failure about yourself  0  0      Trouble concentrating 0  0      Moving slowly or fidgety/restless 0  0      Suicidal thoughts 0  0      PHQ-9 Score 0  4  0      Review of Systems  All other systems reviewed and are negative.      Objective:   Physical Exam Vitals and nursing note reviewed.  Constitutional:      Appearance: Normal appearance.  Cardiovascular:     Rate and Rhythm: Normal rate and regular rhythm.     Pulses: Normal pulses.     Heart sounds: Normal heart sounds.  Pulmonary:     Effort: Pulmonary effort is normal.     Breath sounds: Normal breath sounds.  Musculoskeletal:     Comments: Normal Muscle Bulk and Muscle Testing Reveals:  Upper Extremities: Full ROM and Muscle Strength 5/5 Lumbar Hypersensitivity Bilateral Greater Trochanter Tenderness Lower Extremities: Decreased ROM and Muscle Strength 5/5 Bilateral Lower Extremities Flexion Produces Pain into his Lumbar Antalgic  Gait     Skin:    General: Skin is warm and dry.  Neurological:     Mental Status: He is alert and oriented to person, place, and time.  Psychiatric:        Mood and Affect: Mood normal.        Behavior: Behavior normal.          Assessment & Plan:  1.  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 He has lumbar degenerative disc disease. Continue current medication regimen with  Gabapentin , Continue current Medication regimen . S/P  Left  Lumbar Facet Radiofrequency Ablation with sedation, on 04/21/2022 by Dr. Tanya with good results noted and S/P Right RFA with Dr. Kennard on 12/14,2023, with good relief noted.  Refilled: MS Contin  30 mg one tablet every 12 hours  #60 and Oxycodone  10 mg one tablet every 6 hours as needed for pain #120. We will continue the opioid monitoring program, this consists of regular clinic visits, examinations, urine  drug screen, pill counts as well as use of [place]  Controlled Substance Reporting system. A 12 month History has been reviewed on the [place]  Controlled Substance Reporting System on 03/08/2024. 2. Sacroiliac Dysfunction: Continue  with current medication, heat and exercise regime. 03/08/2024 3. Muscle Spasm: Continue Tizanidine  as needed. Continue to Monitor.03/08/2024 4. Chronic Pain Syndrome: Continue MS Contin  and Oxycodone  as needed for pain. Continue to Monitor. 03/08/2024. 5. Left Greater Trochanteric Tenderness: Left Hip Osteoarthritis:  S/P  left intra-articular hip injection  with 8 days of relief Mr. Swaim reports. Continue with alternating Ice and Heat Therapy. Continue to monitor. 03/08/2024. 6. Bradycardia: No Br adycardia today,Mr. Tiger states his medication was changed by his GI Physcian He's prescribed Coreg and his  PCP and Gastroenterologist Following he states. We will continue to monitor.03/08/2024 7. Weight Loss:  Solicitor Following. . Mr. Skoczylas reports  he was instructed to lose weight by his gastroenterologist , he is following a healthy diet regime and exercise. We will continue to monitor. 03/08/2024.   F/U in 1 month

## 2024-03-08 ENCOUNTER — Encounter: Attending: Physical Medicine & Rehabilitation | Admitting: Registered Nurse

## 2024-03-08 ENCOUNTER — Encounter: Payer: Self-pay | Admitting: Registered Nurse

## 2024-03-08 VITALS — BP 113/70 | HR 61 | Ht 73.0 in | Wt 245.0 lb

## 2024-03-08 DIAGNOSIS — Z79891 Long term (current) use of opiate analgesic: Secondary | ICD-10-CM | POA: Insufficient documentation

## 2024-03-08 DIAGNOSIS — M961 Postlaminectomy syndrome, not elsewhere classified: Secondary | ICD-10-CM | POA: Insufficient documentation

## 2024-03-08 DIAGNOSIS — M7062 Trochanteric bursitis, left hip: Secondary | ICD-10-CM | POA: Diagnosis not present

## 2024-03-08 DIAGNOSIS — R001 Bradycardia, unspecified: Secondary | ICD-10-CM | POA: Insufficient documentation

## 2024-03-08 DIAGNOSIS — G894 Chronic pain syndrome: Secondary | ICD-10-CM | POA: Diagnosis not present

## 2024-03-08 DIAGNOSIS — M7061 Trochanteric bursitis, right hip: Secondary | ICD-10-CM | POA: Diagnosis not present

## 2024-03-08 DIAGNOSIS — M5416 Radiculopathy, lumbar region: Secondary | ICD-10-CM | POA: Diagnosis not present

## 2024-03-08 DIAGNOSIS — Z5181 Encounter for therapeutic drug level monitoring: Secondary | ICD-10-CM | POA: Diagnosis not present

## 2024-03-08 MED ORDER — OXYCODONE HCL 10 MG PO TABS: 10.0000 mg | ORAL_TABLET | Freq: Four times a day (QID) | ORAL | 0 refills | Status: DC | PRN

## 2024-03-08 MED ORDER — MORPHINE SULFATE ER 30 MG PO TBCR: 30.0000 mg | EXTENDED_RELEASE_TABLET | Freq: Two times a day (BID) | ORAL | 0 refills | Status: DC

## 2024-03-22 ENCOUNTER — Other Ambulatory Visit

## 2024-04-11 ENCOUNTER — Ambulatory Visit
Admission: RE | Admit: 2024-04-11 | Discharge: 2024-04-11 | Disposition: A | Discharge status: Home / Self Care | Source: Ambulatory Visit | Attending: Physician Assistant | Admitting: Physician Assistant

## 2024-04-11 DIAGNOSIS — K7460 Unspecified cirrhosis of liver: Secondary | ICD-10-CM | POA: Diagnosis not present

## 2024-04-11 DIAGNOSIS — K7469 Other cirrhosis of liver: Secondary | ICD-10-CM

## 2024-04-11 MED ORDER — GADOPICLENOL 0.5 MMOL/ML IV SOLN
10.0000 mL | Freq: Once | INTRAVENOUS | Status: AC | PRN
  Administered 2024-04-11: 10 mL via INTRAVENOUS

## 2024-04-12 ENCOUNTER — Encounter: Attending: Physical Medicine & Rehabilitation | Admitting: Registered Nurse

## 2024-04-12 ENCOUNTER — Encounter: Payer: Self-pay | Admitting: Registered Nurse

## 2024-04-12 VITALS — BP 124/74 | HR 62 | Ht 73.0 in | Wt 254.0 lb

## 2024-04-12 DIAGNOSIS — Z5181 Encounter for therapeutic drug level monitoring: Secondary | ICD-10-CM | POA: Diagnosis not present

## 2024-04-12 DIAGNOSIS — G894 Chronic pain syndrome: Secondary | ICD-10-CM | POA: Insufficient documentation

## 2024-04-12 DIAGNOSIS — M7062 Trochanteric bursitis, left hip: Secondary | ICD-10-CM | POA: Diagnosis not present

## 2024-04-12 DIAGNOSIS — M5416 Radiculopathy, lumbar region: Secondary | ICD-10-CM | POA: Insufficient documentation

## 2024-04-12 DIAGNOSIS — Z79891 Long term (current) use of opiate analgesic: Secondary | ICD-10-CM | POA: Diagnosis not present

## 2024-04-12 DIAGNOSIS — M961 Postlaminectomy syndrome, not elsewhere classified: Secondary | ICD-10-CM | POA: Diagnosis not present

## 2024-04-12 DIAGNOSIS — M7061 Trochanteric bursitis, right hip: Secondary | ICD-10-CM | POA: Insufficient documentation

## 2024-04-12 MED ORDER — OXYCODONE HCL 10 MG PO TABS: 10.0000 mg | ORAL_TABLET | Freq: Four times a day (QID) | ORAL | 0 refills | Status: DC | PRN

## 2024-04-12 MED ORDER — MORPHINE SULFATE ER 30 MG PO TBCR: 30.0000 mg | EXTENDED_RELEASE_TABLET | Freq: Two times a day (BID) | ORAL | 0 refills | Status: DC

## 2024-04-12 NOTE — Progress Notes (Signed)
 Subjective:    Patient ID: Samuel [name], male    DOB: 02-03-1976, 48 y.o.   MRN: [id number]  HPI: Samuel [name] is a 48 y.o. male who returns for follow up appointment for chronic pain and medication refill. He states his pain is located in his lower back radiating into his bilateral lower extremities. Also reports bilateral hip pain. He rates his pain 6. His current exercise regime is walking and performing stretching exercises.  Samuel [name] Morphine  equivalent is 120.00 MME.   Last UDS was Performed on 02/09/2024, it was consistent.    Pain Inventory Average Pain 5 Pain Right Now 6  My pain is constant, sharp, burning, dull, stabbing, and aching  In the last 24 hours, has pain interfered with the following? General activity 9 Relation with others 9 Enjoyment of life 9 What TIME of day is your pain at its worst? morning , evening, and night Sleep (in general) Fair  Pain is worse with: walking, bending, sitting, inactivity, and standing Pain improves with: rest and medication Relief from Meds: 6  Family History  Problem Relation Age of Onset   Hypertension Father    Social History   Socioeconomic History   Marital status: Married    Spouse name: Not on file   Number of children: Not on file   Years of education: Not on file   Highest education level: Not on file  Occupational History   Not on file  Tobacco Use   Smoking status: Never   Smokeless tobacco: Never  Vaping Use   Vaping status: Never Used  Substance and Sexual Activity   Alcohol  use: No    Alcohol /week: 0.0 standard drinks of alcohol    Drug use: No   Sexual activity: Not on file  Other Topics Concern   Not on file  Social History Narrative   Not on file   Social Drivers of Health   Financial Resource Strain: Medium Risk (07/22/2023)   Received from [facility]   Overall Financial Resource Strain (CARDIA)    Difficulty of Paying Living Expenses: Somewhat hard  Food Insecurity: Food  Insecurity Present (07/22/2023)   Received from [facility]   Hunger Vital Sign    Within the past 12 months, you worried that your food would run out before you got the money to buy more.: Sometimes true    Within the past 12 months, the food you bought just didn't last and you didn't have money to get more.: Sometimes true  Transportation Needs: No Transportation Needs (07/22/2023)   Received from [facility] - Transportation    Lack of Transportation (Medical): No    Lack of Transportation (Non-Medical): No  Physical Activity: Unknown (07/22/2023)   Received from [facility]   Exercise Vital Sign    On average, how many days per week do you engage in moderate to strenuous exercise (like a brisk walk)?: 0 days    Minutes of Exercise per Session: Not on file  Stress: Stress Concern Present (07/22/2023)   Received from [facility] - Occupational Stress Questionnaire    Feeling of Stress : Rather much  Social Connections: Moderately Integrated (07/22/2023)   Received from [facility]   Social Network    How would you rate your social network (family, work, friends)?: Adequate participation with social networks   Past Surgical History:  Procedure Laterality Date   BACK SURGERY     lower back  surgery x 2   Past Surgical History:  Procedure Laterality Date   BACK SURGERY     lower back surgery x 2   Past Medical History:  Diagnosis Date   Arthritis    some in my back   GERD (gastroesophageal reflux disease)    Headache(784.0)    There were no vitals taken for this visit.  Opioid Risk Score:   Fall Risk Score:  `1  Depression screen [facility] 2/9     02/09/2024    9:05 AM 01/05/2024    9:19 AM 12/08/2023    9:22 AM 11/10/2023    9:26 AM 09/15/2023    9:40 AM 08/11/2023    9:07 AM 06/09/2023    9:28 AM  Depression screen PHQ 2/9  Decreased Interest 0 1 1 1   0 0  Down, Depressed, Hopeless 0 1 0 1 0 0 0  PHQ - 2 Score  0 2 1 2  0 0 0  Altered sleeping 0  1  0    Tired, decreased energy 0  1      Change in appetite 0  1      Feeling bad or failure about yourself  0  0      Trouble concentrating 0  0      Moving slowly or fidgety/restless 0  0      Suicidal thoughts 0  0      PHQ-9 Score 0   4   0        Data saved with a previous flowsheet row definition    Review of Systems  Musculoskeletal:  Positive for back pain.       Pain in both upper hips down to both knees  All other systems reviewed and are negative.      Objective:   Physical Exam Vitals and nursing note reviewed.  Constitutional:      Appearance: Normal appearance.  Cardiovascular:     Rate and Rhythm: Normal rate and regular rhythm.     Pulses: Normal pulses.     Heart sounds: Normal heart sounds.  Pulmonary:     Effort: Pulmonary effort is normal.     Breath sounds: Normal breath sounds.  Musculoskeletal:     Comments: Normal Muscle Bulk and Muscle Testing Reveals:  Upper Extremities: Full ROM and Muscle Strength 5/5  Lumbar Hypersensitivity Bilateral Greater Trochanter Tenderness Lower Extremities: Full ROM and Muscle Strength 5/5 Arises from Table slowly using cane for support Antalgic Gait     Skin:    General: Skin is warm and dry.  Neurological:     Mental Status: He is alert and oriented to person, place, and time.  Psychiatric:        Mood and Affect: Mood normal.        Behavior: Behavior normal.          Assessment & Plan:  1.  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 He has lumbar degenerative disc disease. Continue current medication regimen with  Gabapentin , Continue current Medication regimen . S/P Bilateral  Lumbar Facet Radiofrequency Ablation with sedation, on 05/07/2022 by Dr. Tanya with good results noted .  Refilled: MS Contin  30 mg one tablet every 12 hours  #60 and Oxycodone  10 mg one tablet every 6 hours as needed for pain #120. We will continue the opioid  monitoring program, this consists of regular clinic visits, examinations, urine drug screen, pill counts as well as use of [place]   Controlled Substance Reporting system. A 12 month History has been reviewed on the [place]  Controlled Substance Reporting System on 04/12/2024. 2. Sacroiliac Dysfunction: Continue with current medication, heat and exercise regime. 04/12/2024 3. Muscle Spasm: Continue Tizanidine  as needed. Continue to Monitor.04/12/2024 4. Chronic Pain Syndrome: Continue MS Contin  and Oxycodone  as needed for pain. Continue to Monitor. 04/12/2024. 5. Left Greater Trochanteric Tenderness: Left Hip Osteoarthritis:  S/P  left intra-articular hip injection  with 8 days of relief Mr. Meulemans reports. Continue with alternating Ice and Heat Therapy. Continue to monitor. 04/12/2024. 6. Bradycardia: No Bradycardia today,Mr. Jalbert states his medication was changed by his GI Physcian He's prescribed Coreg and his  PCP and Gastroenterologist Following he states. We will continue to monitor.04/12/2024 7. Weight Loss:  Gastroenterologist Following. .Mr. Whedbee gain 9 lbs. Mr. Spicher reports in the past  he was instructed to lose weight by his gastroenterologist , he is following a healthy diet regime and exercise. We will continue to monitor. 04/12/2024.   F/U in 1 month

## 2024-04-13 ENCOUNTER — Other Ambulatory Visit: Payer: Self-pay | Admitting: Registered Nurse

## 2024-04-13 ENCOUNTER — Encounter: Payer: Self-pay | Admitting: Physician Assistant

## 2024-04-17 MED ORDER — GABAPENTIN 300 MG PO CAPS: ORAL_CAPSULE | ORAL | 1 refills | Status: AC

## 2024-05-10 ENCOUNTER — Encounter: Attending: Physical Medicine & Rehabilitation | Admitting: Registered Nurse

## 2024-05-10 VITALS — BP 109/60 | HR 62 | Ht 73.0 in | Wt 256.0 lb

## 2024-05-10 DIAGNOSIS — Z5181 Encounter for therapeutic drug level monitoring: Secondary | ICD-10-CM

## 2024-05-10 DIAGNOSIS — M7061 Trochanteric bursitis, right hip: Secondary | ICD-10-CM | POA: Diagnosis present

## 2024-05-10 DIAGNOSIS — M961 Postlaminectomy syndrome, not elsewhere classified: Secondary | ICD-10-CM | POA: Diagnosis present

## 2024-05-10 DIAGNOSIS — G894 Chronic pain syndrome: Secondary | ICD-10-CM

## 2024-05-10 DIAGNOSIS — Z79891 Long term (current) use of opiate analgesic: Secondary | ICD-10-CM | POA: Diagnosis present

## 2024-05-10 DIAGNOSIS — M7062 Trochanteric bursitis, left hip: Secondary | ICD-10-CM | POA: Diagnosis present

## 2024-05-10 DIAGNOSIS — M5416 Radiculopathy, lumbar region: Secondary | ICD-10-CM | POA: Diagnosis present

## 2024-05-10 MED ORDER — MORPHINE SULFATE ER 30 MG PO TBCR: 30.0000 mg | EXTENDED_RELEASE_TABLET | Freq: Two times a day (BID) | ORAL | 0 refills | Status: DC

## 2024-05-10 MED ORDER — OXYCODONE HCL 10 MG PO TABS: 10.0000 mg | ORAL_TABLET | Freq: Four times a day (QID) | ORAL | 0 refills | Status: DC | PRN

## 2024-05-10 NOTE — Progress Notes (Signed)
 "  Subjective:    Patient ID: Samuel [name], male    DOB: [birth year], 48 y.o.   MRN: [id number]  HPI: Samuel [name] is a 48 y.o. male who returns for follow up appointment for chronic pain and medication refill. He states his pain is located in his lower back radiating into his bilateral hips and bilateral lower extremities.Samuel [name] He rates his pain 6. His current exercise regime is walking and performing stretching exercises.  Samuel [name] [name]  [name] is 120.00 MME.   Last UDS was Performed on 02/09/2024, it was consistent.    Pain Inventory Average Pain 6 Pain Right Now 6 My pain is constant, sharp, burning, dull, stabbing, and aching  In the last 24 hours, has pain interfered with the following? General activity 9 Relation with others 9 Enjoyment of life 9 What TIME of day is your pain at its worst? morning , evening, and night Sleep (in general) Fair  Pain is worse with: walking, bending, sitting, inactivity, and standing Pain improves with: rest and medication Relief from Meds: 5  Family History  Problem Relation Age of Onset   Hypertension Father    Social History   Socioeconomic History   Marital status: Married    Spouse name: Not on file   Number of children: Not on file   Years of education: Not on file   Highest education level: Not on file  Occupational History   Not on file  Tobacco Use   Smoking status: Never   Smokeless tobacco: Never  Vaping Use   Vaping status: Never Used  Substance and Sexual Activity   Alcohol  use: No    Alcohol /week: 0.0 standard drinks of alcohol    Drug use: No   Sexual activity: Not on file  Other Topics Concern   Not on file  Social History Narrative   Not on file   Social Drivers of Health   Tobacco Use: Low Risk (04/12/2024)   Patient History    Smoking Tobacco Use: Never    Smokeless Tobacco Use: Never    Passive Exposure: Not on file  Financial Resource Strain: Medium Risk (07/22/2023)   Received from  [facility]   Overall Financial Resource Strain (CARDIA)    Difficulty of Paying Living Expenses: Somewhat hard  Food Insecurity: Food Insecurity Present (07/22/2023)   Received from [facility]   Epic    Within the past 12 months, you worried that your food would run out before you got the money to buy more.: Sometimes true    Within the past 12 months, the food you bought just didn't last and you didn't have money to get more.: Sometimes true  Transportation Needs: No Transportation Needs (07/22/2023)   Received from [facility] - Transportation    Lack of Transportation (Medical): No    Lack of Transportation (Non-Medical): No  Physical Activity: Unknown (07/22/2023)   Received from [facility]   Exercise Vital Sign    On average, how many days per week do you engage in moderate to strenuous exercise (like a brisk walk)?: 0 days    Minutes of Exercise per Session: Not on file  Stress: Stress Concern Present (07/22/2023)   Received from [facility] Occupational Health - Occupational Stress Questionnaire    Feeling of Stress : Rather much  Social Connections: Moderately Integrated (07/22/2023)   Received from [facility]   Social Network    How would you rate your social  network (family, work, friends)?: Adequate participation with social networks  Depression (PHQ2-9): Low Risk (04/12/2024)   Depression (PHQ2-9)    PHQ-2 Score: 2  Alcohol  Screen: Not on file  Housing: Low Risk (07/22/2023)   Received from [facility]    In the last 12 months, was there a time when you were not able to pay the mortgage or rent on time?: No    In the past 12 months, how many times have you moved where you were living?: 0    At any time in the past 12 months, were you homeless or living in a shelter (including now)?: No  Utilities: Not At Risk (07/22/2023)   Received from [facility] Utilities    Threatened with loss of utilities: No  Health  Literacy: Not on file   Past Surgical History:  Procedure Laterality Date   BACK SURGERY     lower back surgery x 2   Past Surgical History:  Procedure Laterality Date   BACK SURGERY     lower back surgery x 2   Past Medical History:  Diagnosis Date   Arthritis    some in my back   GERD (gastroesophageal reflux disease)    Headache(784.0)    There were no vitals taken for this visit.  Opioid Risk Score:   Fall Risk Score:  `1  Depression screen [facility] 2/9     04/12/2024    9:10 AM 02/09/2024    9:05 AM 01/05/2024    9:19 AM 12/08/2023    9:22 AM 11/10/2023    9:26 AM 09/15/2023    9:40 AM 08/11/2023    9:07 AM  Depression screen PHQ 2/9  Decreased Interest 1 0 1 1 1   0  Down, Depressed, Hopeless 1 0 1 0 1 0 0  PHQ - 2 Score 2 0 2 1 2  0 0  Altered sleeping  0  1  0   Tired, decreased energy  0  1     Change in appetite  0  1     Feeling bad or failure about yourself   0  0     Trouble concentrating  0  0     Moving slowly or fidgety/restless  0  0     Suicidal thoughts  0  0     PHQ-9 Score  0   4   0       Data saved with a previous flowsheet row definition      Review of Systems  Musculoskeletal:  Positive for back pain.       B/L hip pain  All other systems reviewed and are negative.      Objective:   Physical Exam Vitals and nursing note reviewed.  Constitutional:      Appearance: Normal appearance.  Cardiovascular:     Rate and Rhythm: Normal rate and regular rhythm.     Pulses: Normal pulses.     Heart sounds: Normal heart sounds.  Pulmonary:     Effort: Pulmonary effort is normal.     Breath sounds: Normal breath sounds.  Musculoskeletal:     Comments: Normal Muscle Bulk and Muscle Testing Reveals:  Upper Extremities: Full ROM and Muscle Strength 5/5  Lumbar Hypersensitivity Bilateral Greater Trochanter Tenderness Lower Extremities: Decreased ROM and Muscle Strength 5/5 Bilateral Lower Extremity Flexion Produces Pain into his Lumbar Arises  from Table slowly using cane for support Antalgic  Gait  Skin:    General: Skin is warm and dry.  Neurological:     Mental Status: He is alert and oriented to person, place, and time.  Psychiatric:        Mood and Affect: Mood normal.        Behavior: Behavior normal.          Assessment & Plan:  1.  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 He has lumbar degenerative disc disease. Continue current medication regimen with  Gabapentin , Continue current Medication regimen . S/P Bilateral  Lumbar Facet Radiofrequency Ablation with sedation, on 05/07/2022 by Dr. Tanya with good results noted .  Refilled: MS Contin  30 mg one tablet every 12 hours  #60 and Oxycodone  10 mg one tablet every 6 hours as needed for pain #120. We will continue the opioid monitoring program, this consists of regular clinic visits, examinations, urine drug screen, pill counts as well as use of [place]  Controlled Substance Reporting system. A 12 month History has been reviewed on the [place]  Controlled Substance Reporting System on 05/10/2024. 2. Sacroiliac Dysfunction: Continue with current medication, heat and exercise regime. 05/10/2024 3. Muscle Spasm: Continue Tizanidine  as needed. Continue to Monitor.05/10/2024 4. Chronic Pain Syndrome: Continue MS Contin  and Oxycodone  as needed for pain. Continue to Monitor. 05/10/2024. 5. Left Greater Trochanteric Tenderness: Left Hip Osteoarthritis:  S/P  left intra-articular hip injection  with 8 days of relief Samuel [name] reports. Continue with alternating Ice and Heat Therapy. Continue to monitor. 05/10/2024. 6. Bradycardia: No Bradycardia today,Samuel [name] states his medication was changed by his GI Physcian He's prescribed Coreg and his  PCP and Gastroenterologist Following he states. We will continue to monitor.05/10/2024 7. Weight Loss:  Gastroenterologist Following. .Samuel [name] gain 2 lbs. Samuel [name] reports in the past  he was  instructed to lose weight by his gastroenterologist , he is following a healthy diet regime and exercise. We will continue to monitor. 05/10/2024.   F/U in 1 month   "

## 2024-05-20 ENCOUNTER — Encounter: Payer: Self-pay | Admitting: Registered Nurse

## 2024-06-06 NOTE — Progress Notes (Unsigned)
 "  Subjective:    Patient ID: Samuel [name], male    DOB: 1976/03/23, 49 y.o.   MRN: [id number]  HPI: Samuel [name] is a 49 y.o. male who returns for follow up appointment for chronic pain and medication refill.He states his pain is located in his lower back radiating into her bilateral hips and bilateral lower extremities. He  rates his pain 6. His current exercise regime is walking and performing stretching exercises.  [name]  equivalent is 120.00 MME.   Oral Swab was Performed today.    Pain Inventory Average Pain 6 Pain Right Now 6 My pain is constant, sharp, burning, dull, stabbing, and aching  In the last 24 hours, has pain interfered with the following? General activity 9 Relation with others 9 Enjoyment of life 9 What TIME of day is your pain at its worst? morning , evening, and night Sleep (in general) Fair  Pain is worse with: walking, bending, sitting, inactivity, and standing Pain improves with: rest and medication Relief from Meds: 5  Family History  Problem Relation Age of Onset   Hypertension Father    Social History   Socioeconomic History   Marital status: Married    Spouse name: Not on file   Number of children: Not on file   Years of education: Not on file   Highest education level: Not on file  Occupational History   Not on file  Tobacco Use   Smoking status: Never   Smokeless tobacco: Never  Vaping Use   Vaping status: Never Used  Substance and Sexual Activity   Alcohol  use: No    Alcohol /week: 0.0 standard drinks of alcohol    Drug use: No   Sexual activity: Not on file  Other Topics Concern   Not on file  Social History Narrative   Not on file   Social Drivers of Health   Tobacco Use: Low Risk (05/20/2024)   Patient History    Smoking Tobacco Use: Never    Smokeless Tobacco Use: Never    Passive Exposure: Not on file  Financial Resource Strain: Medium Risk (07/22/2023)   Received from [facility]   Overall  Financial Resource Strain (CARDIA)    Difficulty of Paying Living Expenses: Somewhat hard  Food Insecurity: Food Insecurity Present (07/22/2023)   Received from [facility]   Epic    Within the past 12 months, you worried that your food would run out before you got the money to buy more.: Sometimes true    Within the past 12 months, the food you bought just didn't last and you didn't have money to get more.: Sometimes true  Transportation Needs: No Transportation Needs (07/22/2023)   Received from [facility] - Transportation    Lack of Transportation (Medical): No    Lack of Transportation (Non-Medical): No  Physical Activity: Unknown (07/22/2023)   Received from [facility]   Exercise Vital Sign    On average, how many days per week do you engage in moderate to strenuous exercise (like a brisk walk)?: 0 days    Minutes of Exercise per Session: Not on file  Stress: Stress Concern Present (07/22/2023)   Received from [facility] of Occupational Health - Occupational Stress Questionnaire    Feeling of Stress : Rather much  Social Connections: Moderately Integrated (07/22/2023)   Received from [facility]   Social Network    How would you rate your social network (family, work, friends)?:  Adequate participation with social networks  Depression (PHQ2-9): Low Risk (04/12/2024)   Depression (PHQ2-9)    PHQ-2 Score: 2  Alcohol  Screen: Not on file  Housing: Low Risk (07/22/2023)   Received from [facility]    In the last 12 months, was there a time when you were not able to pay the mortgage or rent on time?: No    In the past 12 months, how many times have you moved where you were living?: 0    At any time in the past 12 months, were you homeless or living in a shelter (including now)?: No  Utilities: Not At Risk (07/22/2023)   Received from [facility] Utilities    Threatened with loss of utilities: No  Health Literacy: Not on file    Past Surgical History:  Procedure Laterality Date   BACK SURGERY     lower back surgery x 2   Past Surgical History:  Procedure Laterality Date   BACK SURGERY     lower back surgery x 2   Past Medical History:  Diagnosis Date   Arthritis    some in my back   GERD (gastroesophageal reflux disease)    Headache(784.0)    There were no vitals taken for this visit.  Opioid Risk Score:   Fall Risk Score:  `1  Depression screen [facility] 2/9     04/12/2024    9:10 AM 02/09/2024    9:05 AM 01/05/2024    9:19 AM 12/08/2023    9:22 AM 11/10/2023    9:26 AM 09/15/2023    9:40 AM 08/11/2023    9:07 AM  Depression screen PHQ 2/9  Decreased Interest 1 0 1 1 1   0  Down, Depressed, Hopeless 1 0 1 0 1 0 0  PHQ - 2 Score 2 0 2 1 2  0 0  Altered sleeping  0  1  0   Tired, decreased energy  0  1     Change in appetite  0  1     Feeling bad or failure about yourself   0  0     Trouble concentrating  0  0     Moving slowly or fidgety/restless  0  0     Suicidal thoughts  0  0     PHQ-9 Score  0   4   0       Data saved with a previous flowsheet row definition    Review of Systems  Musculoskeletal:  Positive for gait problem. Negative for joint swelling.       Pain in both hips down both legs down to upper calves  All other systems reviewed and are negative.      Objective:   Physical Exam Vitals and nursing note reviewed.  Constitutional:      Appearance: Normal appearance.  Cardiovascular:     Rate and Rhythm: Normal rate and regular rhythm.     Pulses: Normal pulses.     Heart sounds: Normal heart sounds.  Pulmonary:     Effort: Pulmonary effort is normal.     Breath sounds: Normal breath sounds.  Musculoskeletal:     Comments: Normal Muscle Bulk and Muscle Testing Reveals:  Upper Extremities: Full ROM and Muscle Strength 5/5  Lumbar Hypersensitivity Bilateral Greater Trochanter Tenderness Lower Extremities: Decreased ROM and Muscle Strength 5/5 Bilateral Lower  Extremities Flexion Produces Pain into his Lumbar Arises from Table slowly using cane for support]  Antalgic  Gait     Skin:    General: Skin is warm and dry.  Neurological:     Mental Status: He is alert and oriented to person, place, and time.  Psychiatric:        Mood and Affect: Mood normal.        Behavior: Behavior normal.          Assessment & Plan:   Lumbar postlaminectomy syndrome with chronic low back and radicular pain. He has lumbar degenerative disc disease. Continue current medication regimen with  Gabapentin , Continue current Medication regimen . S/P Bilateral  Lumbar Facet Radiofrequency Ablation with sedation, on 05/07/2022 by Dr. Tanya with good results noted .  Refilled: MS Contin  30 mg one tablet every 12 hours  #60 and Oxycodone  10 mg one tablet every 6 hours as needed for pain #120. We will continue the opioid monitoring program, this consists of regular clinic visits, examinations, urine drug screen, pill counts as well as use of [place]  Controlled Substance Reporting system. A 12 month History has been reviewed on the [place]  Controlled Substance Reporting System on 06/07/2024 2. Sacroiliac Dysfunction: Continue with current medication, heat and exercise regime. 06/07/2024 3. Muscle Spasm: Continue Tizanidine  as needed. Continue to Monitor.06/07/2024 4. Chronic Pain Syndrome: Continue MS Contin  and Oxycodone  as needed for pain. Continue to Monitor. 06/07/2024. 5. Left Greater Trochanteric Tenderness: Left Hip Osteoarthritis:  S/P  left intra-articular hip injection  with 8 days of relief Mr. Hamor reports. Continue with alternating Ice and Heat Therapy. Continue to monitor. 06/07/2024. 6. Bradycardia: No Bradycardia today,Mr. Hugh states his medication was changed by his GI Physcian He's prescribed Coreg and his  PCP and Gastroenterologist Following he states. We will continue to monitor.06/07/2024 7. Weight Loss:  Gastroenterologist Following. .Mr.  Merkel lost 2 lbs. Mr. Pilot reports in the past  he was instructed to lose weight by his gastroenterologist , he is following a healthy diet regime and exercise. We will continue to monitor. 06/07/2024.   F/U in 1 month    "

## 2024-06-07 ENCOUNTER — Encounter: Attending: Physical Medicine & Rehabilitation | Admitting: Registered Nurse

## 2024-06-07 ENCOUNTER — Encounter: Payer: Self-pay | Admitting: Registered Nurse

## 2024-06-07 VITALS — BP 105/69 | HR 64 | Ht 73.0 in | Wt 254.0 lb

## 2024-06-07 DIAGNOSIS — Z79891 Long term (current) use of opiate analgesic: Secondary | ICD-10-CM | POA: Insufficient documentation

## 2024-06-07 DIAGNOSIS — Z5181 Encounter for therapeutic drug level monitoring: Secondary | ICD-10-CM | POA: Diagnosis not present

## 2024-06-07 DIAGNOSIS — G894 Chronic pain syndrome: Secondary | ICD-10-CM | POA: Diagnosis not present

## 2024-06-07 DIAGNOSIS — M5416 Radiculopathy, lumbar region: Secondary | ICD-10-CM | POA: Insufficient documentation

## 2024-06-07 DIAGNOSIS — M7062 Trochanteric bursitis, left hip: Secondary | ICD-10-CM | POA: Diagnosis not present

## 2024-06-07 DIAGNOSIS — M7061 Trochanteric bursitis, right hip: Secondary | ICD-10-CM | POA: Diagnosis not present

## 2024-06-07 DIAGNOSIS — M961 Postlaminectomy syndrome, not elsewhere classified: Secondary | ICD-10-CM | POA: Diagnosis not present

## 2024-06-07 MED ORDER — OXYCODONE HCL 10 MG PO TABS: 10.0000 mg | ORAL_TABLET | Freq: Four times a day (QID) | ORAL | 0 refills | Status: AC | PRN

## 2024-06-07 MED ORDER — MORPHINE SULFATE ER 30 MG PO TBCR: 30.0000 mg | EXTENDED_RELEASE_TABLET | Freq: Two times a day (BID) | ORAL | 0 refills | Status: AC

## 2024-06-13 LAB — DRUG TOX MONITOR 1 W/CONF, ORAL FLD
Amphetamines: NEGATIVE ng/mL
Barbiturates: NEGATIVE ng/mL
Benzodiazepines: NEGATIVE ng/mL
Buprenorphine: NEGATIVE ng/mL
Cocaine: NEGATIVE ng/mL
Codeine: NEGATIVE ng/mL
Dihydrocodeine: NEGATIVE ng/mL
Fentanyl: NEGATIVE ng/mL
Heroin Metabolite: NEGATIVE ng/mL
Hydrocodone: NEGATIVE ng/mL
Hydromorphone: NEGATIVE ng/mL
MARIJUANA: NEGATIVE ng/mL
MDMA: NEGATIVE ng/mL
Meprobamate: NEGATIVE ng/mL
Methadone: NEGATIVE ng/mL
Morphine: 18.6 ng/mL — ABNORMAL HIGH
Nicotine Metabolite: NEGATIVE ng/mL
Norhydrocodone: NEGATIVE ng/mL
Noroxycodone: 45.1 ng/mL — ABNORMAL HIGH
Opiates: POSITIVE ng/mL — AB
Oxycodone: >250 ng/mL — ABNORMAL HIGH
Oxymorphone: NEGATIVE ng/mL
Phencyclidine: NEGATIVE ng/mL
Tapentadol: NEGATIVE ng/mL
Tramadol: NEGATIVE ng/mL
Zolpidem: NEGATIVE ng/mL

## 2024-06-13 LAB — DRUG TOX ALC METAB W/CON, ORAL FLD: Alcohol Metabolite: NEGATIVE ng/mL

## 2024-07-12 ENCOUNTER — Encounter: Admitting: Registered Nurse

## 2024-08-09 ENCOUNTER — Encounter: Admitting: Registered Nurse
